# Patient Record
Sex: Female | Born: 1945
Health system: Southern US, Community
[De-identification: ages and names within clinical notes are randomized; demographics above are authoritative.]

## PROBLEM LIST (undated history)

## (undated) DIAGNOSIS — F3289 Other specified depressive episodes: Secondary | ICD-10-CM

## (undated) DIAGNOSIS — H3530 Unspecified macular degeneration: Secondary | ICD-10-CM

## (undated) DIAGNOSIS — K219 Gastro-esophageal reflux disease without esophagitis: Secondary | ICD-10-CM

## (undated) DIAGNOSIS — E785 Hyperlipidemia, unspecified: Secondary | ICD-10-CM

## (undated) DIAGNOSIS — I10 Essential (primary) hypertension: Secondary | ICD-10-CM

## (undated) DIAGNOSIS — Z0000 Encounter for general adult medical examination without abnormal findings: Secondary | ICD-10-CM

## (undated) DIAGNOSIS — M797 Fibromyalgia: Secondary | ICD-10-CM

## (undated) DIAGNOSIS — J449 Chronic obstructive pulmonary disease, unspecified: Secondary | ICD-10-CM

## (undated) DIAGNOSIS — R5383 Other fatigue: Secondary | ICD-10-CM

## (undated) DIAGNOSIS — F329 Major depressive disorder, single episode, unspecified: Secondary | ICD-10-CM

## (undated) DIAGNOSIS — M1990 Unspecified osteoarthritis, unspecified site: Secondary | ICD-10-CM

## (undated) HISTORY — DX: Hyperlipidemia, unspecified: E78.5

## (undated) HISTORY — PX: ABDOMINAL HYSTERECTOMY: SHX81

## (undated) HISTORY — DX: Other specified depressive episodes: F32.89

## (undated) HISTORY — DX: Gastro-esophageal reflux disease without esophagitis: K21.9

## (undated) HISTORY — DX: Encounter for general adult medical examination without abnormal findings: Z00.00

## (undated) HISTORY — DX: Other fatigue: R53.83

## (undated) HISTORY — DX: Chronic obstructive pulmonary disease, unspecified: J44.9

## (undated) HISTORY — DX: Major depressive disorder, single episode, unspecified: F32.9

## (undated) HISTORY — DX: Fibromyalgia: M79.7

---

## 2006-09-06 ENCOUNTER — Encounter (HOSPITAL_COMMUNITY): Admission: RE | Admit: 2006-09-06 | Discharge: 2006-10-06 | Payer: Self-pay | Admitting: Sports Medicine

## 2006-10-09 ENCOUNTER — Encounter (HOSPITAL_COMMUNITY): Admission: RE | Admit: 2006-10-09 | Discharge: 2006-11-08 | Payer: Self-pay | Admitting: Sports Medicine

## 2007-06-04 ENCOUNTER — Encounter (INDEPENDENT_AMBULATORY_CARE_PROVIDER_SITE_OTHER): Payer: Self-pay | Admitting: Internal Medicine

## 2007-06-04 ENCOUNTER — Ambulatory Visit: Payer: Self-pay | Admitting: Family Medicine

## 2007-06-04 DIAGNOSIS — R5381 Other malaise: Secondary | ICD-10-CM | POA: Insufficient documentation

## 2007-06-04 DIAGNOSIS — E782 Mixed hyperlipidemia: Secondary | ICD-10-CM | POA: Insufficient documentation

## 2007-06-04 DIAGNOSIS — E119 Type 2 diabetes mellitus without complications: Secondary | ICD-10-CM | POA: Insufficient documentation

## 2007-06-04 DIAGNOSIS — K209 Esophagitis, unspecified without bleeding: Secondary | ICD-10-CM | POA: Insufficient documentation

## 2007-06-04 DIAGNOSIS — F329 Major depressive disorder, single episode, unspecified: Secondary | ICD-10-CM

## 2007-06-04 DIAGNOSIS — E785 Hyperlipidemia, unspecified: Secondary | ICD-10-CM | POA: Insufficient documentation

## 2007-06-04 DIAGNOSIS — IMO0001 Reserved for inherently not codable concepts without codable children: Secondary | ICD-10-CM | POA: Insufficient documentation

## 2007-06-04 DIAGNOSIS — R5383 Other fatigue: Secondary | ICD-10-CM

## 2007-07-17 ENCOUNTER — Ambulatory Visit: Payer: Self-pay | Admitting: Family Medicine

## 2007-07-17 ENCOUNTER — Encounter (INDEPENDENT_AMBULATORY_CARE_PROVIDER_SITE_OTHER): Payer: Self-pay | Admitting: Internal Medicine

## 2007-07-18 ENCOUNTER — Ambulatory Visit: Payer: Self-pay | Admitting: Family Medicine

## 2007-07-19 ENCOUNTER — Encounter (INDEPENDENT_AMBULATORY_CARE_PROVIDER_SITE_OTHER): Payer: Self-pay | Admitting: *Deleted

## 2007-07-19 LAB — CONVERTED CEMR LAB
Creatinine,U: 18 mg/dL
Hgb A1c MFr Bld: 6.8 % — ABNORMAL HIGH (ref 4.6–6.0)
Microalb Creat Ratio: 94.4 mg/g — ABNORMAL HIGH (ref 0.0–30.0)
Microalb, Ur: 1.7 mg/dL (ref 0.0–1.9)

## 2007-08-06 ENCOUNTER — Encounter (INDEPENDENT_AMBULATORY_CARE_PROVIDER_SITE_OTHER): Payer: Self-pay | Admitting: Internal Medicine

## 2007-10-11 ENCOUNTER — Ambulatory Visit: Payer: Self-pay | Admitting: Family Medicine

## 2007-10-11 DIAGNOSIS — J449 Chronic obstructive pulmonary disease, unspecified: Secondary | ICD-10-CM

## 2007-10-11 DIAGNOSIS — J441 Chronic obstructive pulmonary disease with (acute) exacerbation: Secondary | ICD-10-CM | POA: Insufficient documentation

## 2007-10-24 ENCOUNTER — Ambulatory Visit: Payer: Self-pay | Admitting: Internal Medicine

## 2007-10-25 ENCOUNTER — Encounter (INDEPENDENT_AMBULATORY_CARE_PROVIDER_SITE_OTHER): Payer: Self-pay | Admitting: Internal Medicine

## 2007-10-25 LAB — CONVERTED CEMR LAB: Hgb A1c MFr Bld: 7.3 % — ABNORMAL HIGH (ref 4.6–6.0)

## 2007-12-10 ENCOUNTER — Telehealth (INDEPENDENT_AMBULATORY_CARE_PROVIDER_SITE_OTHER): Payer: Self-pay | Admitting: Internal Medicine

## 2007-12-11 ENCOUNTER — Ambulatory Visit: Payer: Self-pay | Admitting: Family Medicine

## 2007-12-13 ENCOUNTER — Ambulatory Visit: Payer: Self-pay | Admitting: Family Medicine

## 2007-12-13 ENCOUNTER — Telehealth (INDEPENDENT_AMBULATORY_CARE_PROVIDER_SITE_OTHER): Payer: Self-pay | Admitting: Internal Medicine

## 2007-12-13 DIAGNOSIS — F17200 Nicotine dependence, unspecified, uncomplicated: Secondary | ICD-10-CM

## 2007-12-13 LAB — CONVERTED CEMR LAB
Albumin: 4.5 g/dL (ref 3.5–5.2)
Alkaline Phosphatase: 75 units/L (ref 39–117)
Basophils Absolute: 0 10*3/uL (ref 0.0–0.1)
Basophils Relative: 0.4 % (ref 0.0–1.0)
Bilirubin, Direct: 0.1 mg/dL (ref 0.0–0.3)
CO2: 31 meq/L (ref 19–32)
Calcium: 9.8 mg/dL (ref 8.4–10.5)
Creatinine, Ser: 0.9 mg/dL (ref 0.4–1.2)
Eosinophils Absolute: 0.3 10*3/uL (ref 0.0–0.7)
Eosinophils Relative: 3.3 % (ref 0.0–5.0)
GFR calc non Af Amer: 67 mL/min
HCT: 46.3 % — ABNORMAL HIGH (ref 36.0–46.0)
HDL: 34.3 mg/dL — ABNORMAL LOW (ref >39.0)
Hgb A1c MFr Bld: 7 % — ABNORMAL HIGH (ref 4.6–6.0)
Lymphocytes Relative: 29.1 % (ref 12.0–46.0)
Monocytes Absolute: 0.5 10*3/uL (ref 0.1–1.0)
Monocytes Relative: 6 % (ref 3.0–12.0)
Neutro Abs: 5.2 10*3/uL (ref 1.4–7.7)
Potassium: 4.7 meq/L (ref 3.5–5.1)
RDW: 12.3 % (ref 11.5–14.6)
TSH: 1.48 microintl units/mL (ref 0.35–5.50)
WBC: 8.5 10*3/uL (ref 4.5–10.5)

## 2008-02-18 ENCOUNTER — Ambulatory Visit: Payer: Self-pay | Admitting: Internal Medicine

## 2008-02-18 LAB — CONVERTED CEMR LAB
Bilirubin Urine: NEGATIVE
Specific Gravity, Urine: 1.015
WBC Urine, dipstick: NEGATIVE

## 2008-02-20 ENCOUNTER — Encounter (INDEPENDENT_AMBULATORY_CARE_PROVIDER_SITE_OTHER): Payer: Self-pay | Admitting: Internal Medicine

## 2008-04-08 ENCOUNTER — Ambulatory Visit: Payer: Self-pay | Admitting: Family Medicine

## 2008-04-10 ENCOUNTER — Ambulatory Visit: Payer: Self-pay | Admitting: Family Medicine

## 2008-04-10 LAB — CONVERTED CEMR LAB
Albumin: 4.3 g/dL (ref 3.5–5.2)
Alkaline Phosphatase: 69 units/L (ref 39–117)
Bilirubin, Direct: 0.1 mg/dL (ref 0.0–0.3)
Cholesterol: 168 mg/dL (ref 0–200)
HDL: 36.4 mg/dL — ABNORMAL LOW (ref >39.0)
LDL Cholesterol: 106 mg/dL — ABNORMAL HIGH (ref 0–99)
Total Bilirubin: 1 mg/dL (ref 0.3–1.2)
Triglycerides: 126 mg/dL (ref 0–149)
VLDL: 25 mg/dL (ref 0–40)

## 2008-04-14 ENCOUNTER — Telehealth: Payer: Self-pay | Admitting: Family Medicine

## 2008-04-14 ENCOUNTER — Ambulatory Visit: Payer: Self-pay | Admitting: Family Medicine

## 2008-04-14 LAB — CONVERTED CEMR LAB
Bilirubin Urine: NEGATIVE
Nitrite: NEGATIVE
Urobilinogen, UA: 0.2

## 2008-04-15 ENCOUNTER — Encounter: Payer: Self-pay | Admitting: Family Medicine

## 2008-05-19 ENCOUNTER — Ambulatory Visit: Payer: Self-pay | Admitting: Family Medicine

## 2008-05-20 ENCOUNTER — Encounter (INDEPENDENT_AMBULATORY_CARE_PROVIDER_SITE_OTHER): Payer: Self-pay | Admitting: Internal Medicine

## 2008-05-22 LAB — CONVERTED CEMR LAB
Cholesterol: 167 mg/dL (ref 0–200)
HDL: 40.4 mg/dL (ref >39.0)
Hgb A1c MFr Bld: 6.7 % — ABNORMAL HIGH (ref 4.6–6.0)

## 2008-06-12 ENCOUNTER — Ambulatory Visit: Payer: Self-pay | Admitting: Family Medicine

## 2008-06-18 ENCOUNTER — Telehealth (INDEPENDENT_AMBULATORY_CARE_PROVIDER_SITE_OTHER): Payer: Self-pay | Admitting: Internal Medicine

## 2008-06-18 LAB — CONVERTED CEMR LAB
ALT: 16 units/L (ref 0–35)
HDL: 36.7 mg/dL — ABNORMAL LOW (ref >39.0)
Hgb A1c MFr Bld: 6.5 % — ABNORMAL HIGH (ref 4.6–6.0)
Total CHOL/HDL Ratio: 4.8
Triglycerides: 142 mg/dL (ref 0–149)
VLDL: 28 mg/dL (ref 0–40)

## 2008-06-25 ENCOUNTER — Telehealth: Payer: Self-pay | Admitting: Family Medicine

## 2008-06-25 ENCOUNTER — Telehealth (INDEPENDENT_AMBULATORY_CARE_PROVIDER_SITE_OTHER): Payer: Self-pay | Admitting: Internal Medicine

## 2008-07-31 ENCOUNTER — Ambulatory Visit: Payer: Self-pay | Admitting: Family Medicine

## 2008-07-31 DIAGNOSIS — D551 Anemia due to other disorders of glutathione metabolism: Secondary | ICD-10-CM | POA: Insufficient documentation

## 2008-08-01 ENCOUNTER — Telehealth (INDEPENDENT_AMBULATORY_CARE_PROVIDER_SITE_OTHER): Payer: Self-pay | Admitting: Internal Medicine

## 2008-08-01 LAB — CONVERTED CEMR LAB
AST: 25 units/L (ref 0–37)
Bilirubin, Direct: 0.1 mg/dL (ref 0.0–0.3)
HDL: 35.8 mg/dL — ABNORMAL LOW (ref >39.0)
LDL Cholesterol: 80 mg/dL (ref 0–99)
VLDL: 26 mg/dL (ref 0–40)

## 2008-08-11 ENCOUNTER — Ambulatory Visit: Payer: Self-pay | Admitting: Family Medicine

## 2008-08-13 ENCOUNTER — Telehealth (INDEPENDENT_AMBULATORY_CARE_PROVIDER_SITE_OTHER): Payer: Self-pay | Admitting: Internal Medicine

## 2008-08-14 LAB — CONVERTED CEMR LAB: Hgb A1c MFr Bld: 6.8 % — ABNORMAL HIGH (ref 4.6–6.0)

## 2008-08-25 ENCOUNTER — Telehealth: Payer: Self-pay | Admitting: Family Medicine

## 2008-09-08 ENCOUNTER — Ambulatory Visit: Payer: Self-pay | Admitting: Family Medicine

## 2008-09-08 ENCOUNTER — Encounter (INDEPENDENT_AMBULATORY_CARE_PROVIDER_SITE_OTHER): Payer: Self-pay | Admitting: Internal Medicine

## 2008-10-14 ENCOUNTER — Telehealth (INDEPENDENT_AMBULATORY_CARE_PROVIDER_SITE_OTHER): Payer: Self-pay | Admitting: Internal Medicine

## 2008-11-05 ENCOUNTER — Ambulatory Visit: Payer: Self-pay | Admitting: Family Medicine

## 2008-11-13 LAB — CONVERTED CEMR LAB
AST: 23 units/L (ref 0–37)
LDL Cholesterol: 59 mg/dL (ref 0–99)
Total CHOL/HDL Ratio: 3

## 2008-11-25 ENCOUNTER — Ambulatory Visit: Payer: Self-pay | Admitting: Family Medicine

## 2008-12-09 ENCOUNTER — Telehealth (INDEPENDENT_AMBULATORY_CARE_PROVIDER_SITE_OTHER): Payer: Self-pay | Admitting: Internal Medicine

## 2008-12-22 ENCOUNTER — Telehealth: Payer: Self-pay | Admitting: Family Medicine

## 2008-12-23 ENCOUNTER — Telehealth (INDEPENDENT_AMBULATORY_CARE_PROVIDER_SITE_OTHER): Payer: Self-pay | Admitting: Internal Medicine

## 2008-12-23 ENCOUNTER — Ambulatory Visit: Payer: Self-pay | Admitting: Family Medicine

## 2008-12-24 ENCOUNTER — Telehealth (INDEPENDENT_AMBULATORY_CARE_PROVIDER_SITE_OTHER): Payer: Self-pay | Admitting: Internal Medicine

## 2008-12-24 LAB — CONVERTED CEMR LAB: Hgb A1c MFr Bld: 7.1 % — ABNORMAL HIGH (ref 4.6–6.5)

## 2009-01-21 ENCOUNTER — Telehealth (INDEPENDENT_AMBULATORY_CARE_PROVIDER_SITE_OTHER): Payer: Self-pay | Admitting: Internal Medicine

## 2009-02-17 ENCOUNTER — Ambulatory Visit: Payer: Self-pay | Admitting: Family Medicine

## 2009-02-20 LAB — CONVERTED CEMR LAB: Hgb A1c MFr Bld: 6.8 % — ABNORMAL HIGH (ref 4.6–6.5)

## 2009-03-16 ENCOUNTER — Ambulatory Visit: Payer: Self-pay | Admitting: Family Medicine

## 2009-03-16 ENCOUNTER — Telehealth: Payer: Self-pay | Admitting: Internal Medicine

## 2009-03-16 LAB — CONVERTED CEMR LAB
Bilirubin Urine: NEGATIVE
Nitrite: NEGATIVE
Specific Gravity, Urine: <1.005
pH: 6

## 2009-03-20 ENCOUNTER — Emergency Department (HOSPITAL_COMMUNITY): Admission: EM | Admit: 2009-03-20 | Discharge: 2009-03-20 | Payer: Self-pay | Admitting: Emergency Medicine

## 2009-03-20 ENCOUNTER — Encounter (INDEPENDENT_AMBULATORY_CARE_PROVIDER_SITE_OTHER): Payer: Self-pay | Admitting: Internal Medicine

## 2009-03-20 DIAGNOSIS — R918 Other nonspecific abnormal finding of lung field: Secondary | ICD-10-CM

## 2009-03-24 ENCOUNTER — Ambulatory Visit: Payer: Self-pay | Admitting: Family Medicine

## 2009-03-24 ENCOUNTER — Encounter (INDEPENDENT_AMBULATORY_CARE_PROVIDER_SITE_OTHER): Payer: Self-pay | Admitting: Internal Medicine

## 2009-03-27 ENCOUNTER — Telehealth (INDEPENDENT_AMBULATORY_CARE_PROVIDER_SITE_OTHER): Payer: Self-pay | Admitting: Internal Medicine

## 2009-04-08 ENCOUNTER — Telehealth (INDEPENDENT_AMBULATORY_CARE_PROVIDER_SITE_OTHER): Payer: Self-pay | Admitting: Internal Medicine

## 2009-04-13 ENCOUNTER — Ambulatory Visit: Payer: Self-pay | Admitting: Family Medicine

## 2009-05-16 ENCOUNTER — Telehealth: Payer: Self-pay | Admitting: Internal Medicine

## 2009-05-18 ENCOUNTER — Telehealth (INDEPENDENT_AMBULATORY_CARE_PROVIDER_SITE_OTHER): Payer: Self-pay | Admitting: Internal Medicine

## 2009-07-06 ENCOUNTER — Telehealth: Payer: Self-pay | Admitting: Family Medicine

## 2009-07-06 ENCOUNTER — Ambulatory Visit: Payer: Self-pay | Admitting: Family Medicine

## 2009-07-06 LAB — CONVERTED CEMR LAB
Ketones, urine, test strip: NEGATIVE
Urobilinogen, UA: 0.2

## 2009-07-24 ENCOUNTER — Telehealth: Payer: Self-pay | Admitting: Family Medicine

## 2009-07-27 ENCOUNTER — Telehealth (INDEPENDENT_AMBULATORY_CARE_PROVIDER_SITE_OTHER): Payer: Self-pay | Admitting: *Deleted

## 2009-07-31 ENCOUNTER — Ambulatory Visit: Payer: Self-pay | Admitting: Family Medicine

## 2009-07-31 DIAGNOSIS — J019 Acute sinusitis, unspecified: Secondary | ICD-10-CM | POA: Insufficient documentation

## 2009-08-18 ENCOUNTER — Ambulatory Visit: Payer: Self-pay | Admitting: Family Medicine

## 2009-08-18 ENCOUNTER — Telehealth: Payer: Self-pay | Admitting: Family Medicine

## 2009-08-19 LAB — CONVERTED CEMR LAB
Microalb Creat Ratio: 23.1 mg/g (ref 0.0–30.0)
Microalb, Ur: 0.4 mg/dL (ref 0.0–1.9)

## 2009-09-03 LAB — HM MAMMOGRAPHY: HM Mammogram: NORMAL

## 2009-09-09 ENCOUNTER — Ambulatory Visit: Payer: Self-pay | Admitting: Family Medicine

## 2009-09-09 ENCOUNTER — Encounter: Payer: Self-pay | Admitting: Family Medicine

## 2009-09-10 DIAGNOSIS — R928 Other abnormal and inconclusive findings on diagnostic imaging of breast: Secondary | ICD-10-CM | POA: Insufficient documentation

## 2009-09-11 ENCOUNTER — Encounter: Payer: Self-pay | Admitting: Family Medicine

## 2009-09-11 ENCOUNTER — Ambulatory Visit: Payer: Self-pay | Admitting: Family Medicine

## 2009-09-17 ENCOUNTER — Ambulatory Visit: Payer: Self-pay | Admitting: Family Medicine

## 2009-09-17 ENCOUNTER — Other Ambulatory Visit: Admission: RE | Admit: 2009-09-17 | Discharge: 2009-09-17 | Payer: Self-pay | Admitting: Family Medicine

## 2009-09-22 ENCOUNTER — Encounter (INDEPENDENT_AMBULATORY_CARE_PROVIDER_SITE_OTHER): Payer: Self-pay | Admitting: *Deleted

## 2009-09-28 ENCOUNTER — Telehealth: Payer: Self-pay | Admitting: Family Medicine

## 2009-09-29 ENCOUNTER — Ambulatory Visit: Payer: Self-pay | Admitting: Family Medicine

## 2009-09-29 ENCOUNTER — Encounter: Payer: Self-pay | Admitting: Family Medicine

## 2009-10-12 ENCOUNTER — Telehealth: Payer: Self-pay | Admitting: Family Medicine

## 2009-10-13 ENCOUNTER — Ambulatory Visit: Payer: Self-pay | Admitting: Family Medicine

## 2009-11-19 ENCOUNTER — Ambulatory Visit: Payer: Self-pay | Admitting: Family Medicine

## 2009-11-27 ENCOUNTER — Ambulatory Visit: Payer: Self-pay | Admitting: Family Medicine

## 2009-12-17 ENCOUNTER — Telehealth: Payer: Self-pay | Admitting: Family Medicine

## 2010-01-01 ENCOUNTER — Ambulatory Visit: Payer: Self-pay | Admitting: Family Medicine

## 2010-01-01 DIAGNOSIS — N390 Urinary tract infection, site not specified: Secondary | ICD-10-CM

## 2010-01-01 LAB — CONVERTED CEMR LAB
Casts: 0 /lpf
Glucose, Urine, Semiquant: NEGATIVE
Ketones, urine, test strip: NEGATIVE

## 2010-01-08 ENCOUNTER — Telehealth: Payer: Self-pay | Admitting: Family Medicine

## 2010-02-09 ENCOUNTER — Ambulatory Visit: Payer: Self-pay | Admitting: Family Medicine

## 2010-03-08 ENCOUNTER — Telehealth: Payer: Self-pay | Admitting: Family Medicine

## 2010-03-09 ENCOUNTER — Ambulatory Visit: Payer: Self-pay | Admitting: Family Medicine

## 2010-03-11 LAB — CONVERTED CEMR LAB: Hgb A1c MFr Bld: 7.3 % — ABNORMAL HIGH (ref 4.6–6.5)

## 2010-03-15 ENCOUNTER — Ambulatory Visit: Payer: Self-pay | Admitting: Family Medicine

## 2010-03-19 ENCOUNTER — Ambulatory Visit: Payer: Self-pay | Admitting: Internal Medicine

## 2010-03-30 ENCOUNTER — Ambulatory Visit: Payer: Self-pay | Admitting: Family Medicine

## 2010-03-30 DIAGNOSIS — J040 Acute laryngitis: Secondary | ICD-10-CM | POA: Insufficient documentation

## 2010-05-10 ENCOUNTER — Telehealth: Payer: Self-pay | Admitting: Family Medicine

## 2010-06-03 ENCOUNTER — Encounter: Payer: Self-pay | Admitting: Pulmonary Disease

## 2010-06-09 ENCOUNTER — Telehealth: Payer: Self-pay | Admitting: Family Medicine

## 2010-06-28 ENCOUNTER — Ambulatory Visit
Admission: RE | Admit: 2010-06-28 | Discharge: 2010-06-28 | Payer: Self-pay | Source: Home / Self Care | Attending: Pulmonary Disease | Admitting: Pulmonary Disease

## 2010-06-29 NOTE — Progress Notes (Signed)
Summary: refill request for xanax  Phone Note Refill Request Call back at Home Phone [phone] Message from:  Patient  Refills Requested: Medication #1:  ALPRAZOLAM 0.5 MG  TABS 1/2 tab two times a day Called request from pt, please send to [facility].  Initial call taken by: Lowella Petties CMA,  January 08, 2010 11:31 AM  Follow-up for Phone Call        [facility] Follow-up by: Linde Gillis CMA Duncan Dull),  January 08, 2010 11:37 AM    Prescriptions: ALPRAZOLAM 0.5 MG  TABS (ALPRAZOLAM) 1/2 tab two times a day  #30 x 1   Entered and Authorized by:   Ruthe Mannan MD   Signed by:   Ruthe Mannan MD on 01/08/2010   Method used:   Telephoned to ...         RxID:   [id number]

## 2010-06-29 NOTE — Letter (Signed)
Summary: Results Follow up Letter  [place] [facility]  [address] [place], [place] [address]   Phone: [phone]  Fax: [phone]    09/22/2009 MRN: [id number]    Sara Stafford [address] [place], [place]  [address]    Dear [name],  The following are the results of your recent test(s):  Test         Result    Pap Smear:        Normal __X___  Not Normal _____ Comments: ______________________________________________________ Cholesterol: LDL(Bad cholesterol):         Your goal is less than:         HDL (Good cholesterol):       Your goal is more than: Comments:  ______________________________________________________ Mammogram:        Normal _____  Not Normal _____ Comments:  ___________________________________________________________________ Hemoccult:        Normal _____  Not normal _______ Comments:    _____________________________________________________________________ Other Tests:    We routinely do not discuss normal results over the telephone.  If you desire a copy of the results, or you have any questions about this information we can discuss them at your next office visit.   Sincerely,    Ruthe Mannan,  M.D.  TA:lsf

## 2010-06-29 NOTE — Progress Notes (Signed)
Summary: alprazolam refill   Phone Note Refill Request Call back at Home Phone [phone] Message from:  Patient on August 18, 2009 9:30 AM  Refills Requested: Medication #1:  ALPRAZOLAM 0.5 MG  TABS 1/2 tab two times a day Patient is also requesting something for yeast infection. [facility] [address] [facility]   Initial call taken by: Melody Comas,  August 18, 2009 9:31 AM  Follow-up for Phone Call        Okay, anything for the yeast infection?  Lugene Fuquay CMA Duncan Dull)  August 18, 2009 9:52 AM   Medication phoned to pharmacy. Lugene Fuquay CMA (AAMA)  August 18, 2009 10:07 AM     New/Updated Medications: DIFLUCAN 100 MG TABS (FLUCONAZOLE) 1 tab by mouth x 1 Prescriptions: DIFLUCAN 100 MG TABS (FLUCONAZOLE) 1 tab by mouth x 1  #1 x 0   Entered and Authorized by:   Ruthe Mannan MD   Signed by:   Melody Comas on 08/18/2009   Method used:   Handwritten   RxID:   [id number] ALPRAZOLAM 0.5 MG  TABS (ALPRAZOLAM) 1/2 tab two times a day  #30 x 1   Entered and Authorized by:   Ruthe Mannan MD   Signed by:   Ruthe Mannan MD on 08/18/2009   Method used:   Handwritten   RxID:   [id number]

## 2010-06-29 NOTE — Assessment & Plan Note (Signed)
Summary: SHINGLES SHOT/DLO  Nurse Visit   Allergies: 1)  ! Doxycycline  Immunizations Administered:  Zostavax # 1:    Vaccine Type: Zostavax    Site: left deltoid    Mfr: Merck    Dose: 0.5 ml    Route: [place]    Given by: Lowella Petties CMA    Exp. Date: 11/06/2010    Lot #: [id number]    VIS given: 03/11/05 given Oct 13, 2009.  Orders Added: 1)  Zoster (Shingles) Vaccine Live [90736] 2)  Admin 1st Vaccine [phone]

## 2010-06-29 NOTE — Assessment & Plan Note (Signed)
Summary: ARON FLU SHOT/RBH  Nurse Visit   Allergies: 1)  ! Doxycycline  Orders Added: 1)  Admin 1st Vaccine [90471] 2)  Flu Vaccine 37yrs + [10272]   Flu Vaccine Consent Questions     Do you have a history of severe allergic reactions to this vaccine? no    Any prior history of allergic reactions to egg and/or gelatin? no    Do you have a sensitivity to the preservative Thimersol? no    Do you have a past history of Guillan-Barre Syndrome? no    Do you currently have an acute febrile illness? no    Have you ever had a severe reaction to latex? no    Vaccine information given and explained to patient? yes    Are you currently pregnant? no    Lot Number:[id number]   Exp Date:11/27/2010   Site Given  Left Deltoid IM

## 2010-06-29 NOTE — Assessment & Plan Note (Signed)
Summary: uti/alc   Vital Signs:  Patient profile:   65 year old female Height:      64.5 inches Weight:      141.50 pounds BMI:     24.00 Temp:     98.1 degrees F oral Pulse rate:   80 / minute Pulse rhythm:   regular BP sitting:   132 / 80  (left arm) Cuff size:   regular  Vitals Entered By: Linde Gillis CMA Duncan Dull) (January 01, 2010 12:14 PM) CC: ?UTI   History of Present Illness: hurts to urinate  used azo - this did help  started some symptoms on friday  bladder pressure as well   no fever  stomach is a little queasy   is drinking lots of water  some decaf   Allergies: 1)  ! Doxycycline  Past History:  Past Medical History: Last updated: Oct 19, 2007 Current Problems:  COPD (ICD-496) HEALTH SCREENING (ICD-V70.0) GERD (ICD-530.1) HYPERLIPIDEMIA (ICD-272.2) FIBROMYALGIA (ICD-729.1) FATIGUE (ICD-780.79) DEPRESSIVE DISORDER (ICD-311) DIABETES-TYPE 2 (ICD-250.00)    Family History: Last updated: 2007-10-19 Father: died at 80--CVA, DM,  Mother: died at 100--aspirated Siblings: 9 sis--1 died at 52 of liver ca                          1 died  at 37 of pancreatic ca                          7 living---DM,                1 br--died of brain ca  DM-PGM MI-0 CVA- 0 Prostate Cancer-0 Breast Cancer-Maunt Ovarian Cancer-0 Uterine Cancer-0 Colon Cancer-0 Drug/ ETOH Abuse-0 Depression-0    Social History: Last updated: 10-19-2007 Marital Status: Married Children: 2---39 and 35, expecting new grand child, has 1 Occupation: not working Current Smoker 45 years 1 ppd  Risk Factors: Caffeine Use: 3 (06/04/2007) Exercise: no (06/04/2007)  Risk Factors: Smoking Status: current (11/25/2008) Packs/Day: 1.0 (11/25/2008) Passive Smoke Exposure: yes (06/04/2007)  Review of Systems General:  Complains of fatigue; denies chills and fever. CV:  Denies chest pain or discomfort and palpitations. Resp:  Denies cough. GI:  Complains of nausea; denies change in  bowel habits and vomiting. GU:  Complains of dysuria and urinary frequency; denies hematuria. MS:  Complains of low back pain. Derm:  Denies rash.  Physical Exam  General:  Well-developed,well-nourished,in no acute distress; alert,appropriate and cooperative throughout examination Abdomen:  mild suprapubic tenderness without rebound or gaurding    Impression & Recommendations:  Problem # 1:  UTI (ICD-599.0) Assessment New  uncomplicated with dysuria  tx with cipro recommend sympt care- see pt instructions   will avoid tea and acidic bev and baths diflucan one dose in case of yeast  Her updated medication list for this problem includes:    Cipro 250 Mg Tabs (Ciprofloxacin hcl) .Marland Kitchen... 1 by mouth two times a day for 5 days  Orders: UA Dipstick W/ Micro (manual) ([address])  Complete Medication List: 1)  Preservision Areds Caps (Multiple vitamins-minerals) .... Take 1 capsule by mouth once a day 2)  Effexor 37.5 Mg Tabs (Venlafaxine hcl) .... 1/2 tab two times a day 3)  Alprazolam 0.5 Mg Tabs (Alprazolam) .... 1/2 tab two times a day 4)  Eq Omeprazole 20 Mg Tbec (Omeprazole) .... Take 1 tablet by mouth once a day as needed 5)  Allegra-d 12 Hour 60-120 Mg Tb12 (Fexofenadine-pseudoephedrine) .Marland KitchenMarland KitchenMarland Kitchen  Take 1 tablet by mouth every 12 hours as needed 6)  Simvastatin 80 Mg Tabs (Simvastatin) .Marland Kitchen.. 1 each evening for cholesterol 7)  Aleve 220 Mg Tabs (Naproxen sodium) .... Otc as directed 8)  Glipizide Xl 10 Mg Xr24h-tab (Glipizide) .... Take 1 once daily for diabetes 9)  Ventolin Hfa 108 (90 Base) Mcg/act Aers (Albuterol sulfate) .... Two puffs four times a day as needed 10)  Spiriva Handihaler 18 Mcg Caps (Tiotropium bromide monohydrate) .... Use one ampule daily as needed. 11)  Cipro 250 Mg Tabs (Ciprofloxacin hcl) .Marland Kitchen.. 1 by mouth two times a day for 5 days 12)  Fluconazole 150 Mg Tabs (Fluconazole) .Marland Kitchen.. 1 by mouth times one as needed for yeast infection as needed  Patient Instructions: 1)   continue drinking lots of water 2)  call or seek care is symptoms don't improve in 2-3 days or if you develop back pain, nausea, or vomiting  3)  take the cipro as directed  4)  take the diflucan only if you get a yeast infection Prescriptions: FLUCONAZOLE 150 MG TABS (FLUCONAZOLE) 1 by mouth times one as needed for yeast infection as needed  #1 x 0   Entered and Authorized by:   Judith Part MD   Signed by:   Judith Part MD on 01/01/2010   Method used:   Print then Give to Patient   RxID:   [id number] CIPRO 250 MG TABS (CIPROFLOXACIN HCL) 1 by mouth two times a day for 5 days  #10 x 0   Entered and Authorized by:   Judith Part MD   Signed by:   Judith Part MD on 01/01/2010   Method used:   Print then Give to Patient   RxID:   [id number]   Current Allergies (reviewed today): ! DOXYCYCLINE  Laboratory Results   Urine Tests  Date/Time Received: January 01, 2010 12:25 PM   Routine Urinalysis   Color: yellow Appearance: Hazy Glucose: negative   (Normal Range: Negative) Bilirubin: negative   (Normal Range: Negative) Ketone: negative   (Normal Range: Negative) Spec. Gravity: 1.010   (Normal Range: 1.003-1.035) Blood: large   (Normal Range: Negative) pH: 5.0   (Normal Range: 5.0-8.0) Protein: trace   (Normal Range: Negative) Urobilinogen: 0.2   (Normal Range: 0-1) Nitrite: negative   (Normal Range: Negative) Leukocyte Esterace: large   (Normal Range: Negative)  Urine Microscopic WBC/HPF: many RBC/HPF: 2-3 Bacteria/HPF: many Mucous/HPF: few Epithelial/HPF: 1-2 Crystals/HPF: 0 Casts/LPF: 0 Yeast/HPF: 0 Other: 0

## 2010-06-29 NOTE — Progress Notes (Signed)
Summary: requests script for advair.  Phone Note Call from Patient   Caller: Patient Call For: Dr. Dayton Martes Summary of Call: Pt was given samples of advair and she is asking if you will give her a script[facility] [place]. Initial call taken by: Lowella Petties CMA,  Sep 28, 2009 12:32 PM    New/Updated Medications: ADVAIR DISKUS 100-50 MCG/DOSE MISC (FLUTICASONE-SALMETEROL) 1 puff 2 times daily Prescriptions: ADVAIR DISKUS 100-50 MCG/DOSE MISC (FLUTICASONE-SALMETEROL) 1 puff 2 times daily  #3 x 0   Entered and Authorized by:   Ruthe Mannan MD   Signed by:   Ruthe Mannan MD on 09/28/2009   Method used:   Electronically to        [facility] [address])       [address]       [place], [place]  [address]       Ph: [phone]       Fax: [phone]   RxID:   [id number]

## 2010-06-29 NOTE — Progress Notes (Signed)
Summary: alprazolam refill  Phone Note Refill Request Call back at [phone] Message from:  [facility] on July 06, 2009 3:47 PM  Refills Requested: Medication #1:  ALPRAZOLAM 0.5 MG  TABS 1/2 tab two times a day   Last Refilled: [facility] [place] [facility] Alprazolam 0.5mg .Please advise.    Method Requested: Telephone to Pharmacy Initial call taken by: Lewanda Rife LPN,  July 06, 2009 3:48 PM    Prescriptions: ALPRAZOLAM 0.5 MG  TABS (ALPRAZOLAM) 1/2 tab two times a day  #30 x 1   Entered and Authorized by:   Ruthe Mannan MD   Signed by:   Ruthe Mannan MD on 07/06/2009   Method used:   Handwritten   RxID:   [id number]   Appended Document: alprazolam refill Medication phoned to pharmacy.

## 2010-06-29 NOTE — Progress Notes (Signed)
Summary: Rx Alprazolam  Phone Note Refill Request Call back at [phone] Message [facility]:  [facility] on Oct 12, 2009 12:57 PM  Refills Requested: Medication #1:  ALPRAZOLAM 0.5 MG  TABS 1/2 tab two times a day Received faxed refill request please advise.   Method Requested: Telephone to Pharmacy Initial call taken by: Linde Gillis CMA Duncan Dull),  Oct 12, 2009 12:58 PM  Follow-up for Phone Call        Rx called to pharmacy Follow-up by: Linde Gillis CMA Duncan Dull),  Oct 12, 2009 2:13 PM    Prescriptions: ALPRAZOLAM 0.5 MG  TABS (ALPRAZOLAM) 1/2 tab two times a day  #30 x 1   Entered and Authorized by:   Ruthe Mannan MD   Signed by:   Ruthe Mannan MD on 10/12/2009   Method used:   [facility]ph[facility] ...       [facility]  [address] [phone][facility] ([facility]ail[facility]       [address]       [place], [place]  [address]       Ph: [phone] or [phone]       Fax: [phone]   RxID:   [id number]

## 2010-06-29 NOTE — Assessment & Plan Note (Signed)
Summary: DISCUSS LAB RESULTS- 30 MINS/ lb   Vital Signs:  Patient profile:   65 year old female Height:      64.5 inches Weight:      141.25 pounds BMI:     23.96 Temp:     98.2 degrees F oral Pulse rate:   72 / minute Pulse rhythm:   regular BP sitting:   120 / 80  (left arm) Cuff size:   regular  Vitals Entered By: Linde Gillis CMA Duncan Dull) (November 27, 2009 11:34 AM) CC: discuss lab results   History of Present Illness: 66 yo here to discuss labs.   DM- a1c was 7.0 in 07/2009, now 7.2   On Glipizide 10 mg daily.  Does not check her CBGs regularly but was 137 fasting this morning. Admits to not eating well over past 3 months. She loves sweets in the summer, like apricot preserves and homeaid ice cream. She has also starting drinking regular sweet tea again.  Denies any recent episodes of hypoglycemia.   Current Medications (verified): 1)  Preservision Areds   Caps (Multiple Vitamins-Minerals) .... Take 1 Capsule By Mouth Once A Day 2)  Effexor 37.5 Mg  Tabs (Venlafaxine Hcl) .... 1/2 Tab Two Times A Day 3)  Alprazolam 0.5 Mg  Tabs (Alprazolam) .... 1/2 Tab Two Times A Day 4)  Eq Omeprazole 20 Mg  Tbec (Omeprazole) .... Take 1 Tablet By Mouth Once A Day As Needed 5)  Allegra-D 12 Hour 60-120 Mg  Tb12 (Fexofenadine-Pseudoephedrine) .... Take 1 Tablet By Mouth Every 12 Hours As Needed 6)  Simvastatin 80 Mg Tabs (Simvastatin) .Marland Kitchen.. 1 Each Evening For Cholesterol 7)  Aleve 220 Mg Tabs (Naproxen Sodium) .... Otc As Directed 8)  Glipizide Xl 10 Mg Xr24h-Tab (Glipizide) .... Take 1 Once Daily For Diabetes 9)  Ventolin Hfa 108 (90 Base) Mcg/act Aers (Albuterol Sulfate) .... Two Puffs Four Times A Day As Needed 10)  Spiriva Handihaler 18 Mcg Caps (Tiotropium Bromide Monohydrate) .... Use One Ampule Daily As Needed.  Allergies: 1)  ! Doxycycline  Review of Systems      See HPI Neuro:  Denies headaches and visual disturbances.  Physical Exam  General:   Well-developed,well-nourished,in no acute distress; alert,appropriate and cooperative throughout examination Psych:  normal affect, talkative and pleasant    Impression & Recommendations:  Problem # 1:  DIABETES-TYPE 2 (ICD-250.00) Assessment Deteriorated Time spent with patient 25 minutes, more than 50% of this time was spent counseling patient on diabetes management. We discussed strategies for cutting back on sugar without feeling deprived.  She will put Splenda in her sweet tea and thinks she can even use that in her homeaid ice cream. She will also try checking her sugars at least twice a week at different times of day so we can see a trend. Pt to come back for lab visit in 3 months to recheck a1c.  Her updated medication list for this problem includes:    Glipizide Xl 10 Mg Xr24h-tab (Glipizide) .Marland Kitchen... Take 1 once daily for diabetes  Complete Medication List: 1)  Preservision Areds Caps (Multiple vitamins-minerals) .... Take 1 capsule by mouth once a day 2)  Effexor 37.5 Mg Tabs (Venlafaxine hcl) .... 1/2 tab two times a day 3)  Alprazolam 0.5 Mg Tabs (Alprazolam) .... 1/2 tab two times a day 4)  Eq Omeprazole 20 Mg Tbec (Omeprazole) .... Take 1 tablet by mouth once a day as needed 5)  Allegra-d 12 Hour 60-120 Mg Tb12 (Fexofenadine-pseudoephedrine) .Marland KitchenMarland KitchenMarland Kitchen  Take 1 tablet by mouth every 12 hours as needed 6)  Simvastatin 80 Mg Tabs (Simvastatin) .Marland Kitchen.. 1 each evening for cholesterol 7)  Aleve 220 Mg Tabs (Naproxen sodium) .... Otc as directed 8)  Glipizide Xl 10 Mg Xr24h-tab (Glipizide) .... Take 1 once daily for diabetes 9)  Ventolin Hfa 108 (90 Base) Mcg/act Aers (Albuterol sulfate) .... Two puffs four times a day as needed 10)  Spiriva Handihaler 18 Mcg Caps (Tiotropium bromide monohydrate) .... Use one ampule daily as needed.  Patient Instructions: 1)  Happy 4th of July and enjoy the babies. 2)  Please make a lab visit only for an a1c in 3 months (250.00).  Current Allergies  (reviewed today): ! DOXYCYCLINE

## 2010-06-29 NOTE — Miscellaneous (Signed)
Summary: Controlled Substance Agreement  Controlled Substance Agreement   Imported By: Lanelle Bal 01/07/2010 12:55:00  _____________________________________________________________________  External Attachment:    Type:   Image     Comment:   External Document

## 2010-06-29 NOTE — Assessment & Plan Note (Signed)
Summary: SINUS INFECTION / LFW   Vital Signs:  Patient profile:   65 year old female Height:      64.5 inches Weight:      144.75 pounds BMI:     24.55 O2 Sat:      97 % on Room air Temp:     97.9 degrees F oral Pulse rate:   80 / minute Pulse rhythm:   regular BP sitting:   124 / 60  (left arm) Cuff size:   regular  Vitals Entered By: Lewanda Rife LPN (February 09, 2010 3:32 PM)  O2 Flow:  Room air CC: sinus infection, head congested,sinus drainage, productive cough with clear phlegm, chills.   History of Present Illness: has been very stopped up and blowing nose  some coughing  is feeling like it is getting tight in her chest and lungs  clear nasal discharge - then turning a bit yellow a little bit of achiness / thinks fever late sunday afternoon  started with it on sunday am  face is hurting over her eyes -- used heat on it   is a smoker  tight cough no wheeze  takes mucinex and takes allegra   coughing up some phlegm - a little color to it      Allergies: 1)  ! Doxycycline  Past History:  Past Medical History: Last updated: 2007/10/14 Current Problems:  COPD (ICD-496) HEALTH SCREENING (ICD-V70.0) GERD (ICD-530.1) HYPERLIPIDEMIA (ICD-272.2) FIBROMYALGIA (ICD-729.1) FATIGUE (ICD-780.79) DEPRESSIVE DISORDER (ICD-311) DIABETES-TYPE 2 (ICD-250.00)    Family History: Last updated: Oct 14, 2007 Father: died at 80--CVA, DM,  Mother: died at 100--aspirated Siblings: 9 sis--1 died at 63 of liver ca                          1 died  at 63 of pancreatic ca                          7 living---DM,                1 br--died of brain ca  DM-PGM MI-0 CVA- 0 Prostate Cancer-0 Breast Cancer-Maunt Ovarian Cancer-0 Uterine Cancer-0 Colon Cancer-0 Drug/ ETOH Abuse-0 Depression-0    Social History: Last updated: 10-14-07 Marital Status: Married Children: 2---39 and 35, expecting new grand child, has 1 Occupation: not working Current Smoker 45 years 1  ppd  Risk Factors: Caffeine Use: 3 (06/04/2007) Exercise: no (06/04/2007)  Risk Factors: Smoking Status: current (11/25/2008) Packs/Day: 1.0 (11/25/2008) Passive Smoke Exposure: yes (06/04/2007)  Review of Systems General:  Complains of chills, fever, and malaise; denies fatigue. Eyes:  Denies blurring and eye irritation. ENT:  Complains of nasal congestion, postnasal drainage, sinus pressure, and sore throat. CV:  Denies chest pain or discomfort and palpitations. Resp:  Complains of cough and sputum productive; denies pleuritic, shortness of breath, and wheezing. Derm:  Denies itching, lesion(s), poor wound healing, and rash. Neuro:  Denies numbness and tingling. Psych:  Denies anxiety and depression. Endo:  Denies excessive thirst and excessive urination.  Physical Exam  General:  Well-developed,well-nourished,in no acute distress; alert,appropriate and cooperative throughout examination Head:  normocephalic, atraumatic, and no abnormalities observed.  some mild maxillary sinus tenderness  Eyes:  vision grossly intact, pupils equal, pupils round, and pupils reactive to light.  no conjunctival pallor, injection or icterus  Ears:  R ear normal and L ear normal.   Nose:  nares are injected and congested bilaterally  Mouth:  pharynx pink and moist, no erythema, and no exudates.  some clear post nasal drip Neck:  supple with full rom and no masses or thyromegally, no JVD or carotid bruit  Chest Wall:  No deformities, masses, or tenderness noted. Lungs:  harsh bs throughout with prolonged exp phase and slt wheeze on forced exp  no rales or rhonchi   Heart:  normal rate, regular rhythm, and no murmur.   Skin:  Intact without suspicious lesions or rashes Cervical Nodes:  No lymphadenopathy noted Psych:  nl affect    Impression & Recommendations:  Problem # 1:  BRONCHITIS- ACUTE (ICD-466.0) Assessment New with uri and inc cough and tight chest in smoker with mild reactive  airways will cover with zithromax has advair to start for 2 weeks counseled on smoking cessation- pt not interested  recommend sympt care- see pt instructions   pt advised to update me if symptoms worsen or do not improve - esp if wheeze The following medications were removed from the medication list:    Cipro 250 Mg Tabs (Ciprofloxacin hcl) .Marland Kitchen... 1 by mouth two times a day for 5 days Her updated medication list for this problem includes:    Allegra-d 12 Hour 60-120 Mg Tb12 (Fexofenadine-pseudoephedrine) .Marland Kitchen... Take 1 tablet by mouth every 12 hours as needed    Ventolin Hfa 108 (90 Base) Mcg/act Aers (Albuterol sulfate) .Marland Kitchen..Marland Kitchen Two puffs four times a day as needed    Spiriva Handihaler 18 Mcg Caps (Tiotropium bromide monohydrate) ..... Use one ampule daily as needed.    Advair Diskus 100-50 Mcg/dose Aepb (Fluticasone-salmeterol) ..... One puff two times daily    Zithromax Z-pak 250 Mg Tabs (Azithromycin) .Marland Kitchen... Take by mouth as directed  Problem # 2:  TOBACCO ABUSE (ICD-305.1) discussed in detail risks of smoking, and possible outcomes including COPD, vascular dz, cancer and also respiratory infections/sinus problems   pt not interested in quitting at this time  Complete Medication List: 1)  Preservision Areds Caps (Multiple vitamins-minerals) .... Take 2 capsules by mouth in am and 2 capsules by mouth in pm. 2)  Effexor 37.5 Mg Tabs (Venlafaxine hcl) .... 1/2 tab two times a day 3)  Alprazolam 0.5 Mg Tabs (Alprazolam) .... 1/2 tab two times a day 4)  Eq Omeprazole 20 Mg Tbec (Omeprazole) .... Take 1 tablet by mouth once a day as needed 5)  Allegra-d 12 Hour 60-120 Mg Tb12 (Fexofenadine-pseudoephedrine) .... Take 1 tablet by mouth every 12 hours as needed 6)  Simvastatin 80 Mg Tabs (Simvastatin) .Marland Kitchen.. 1 each evening for cholesterol 7)  Aleve 220 Mg Tabs (Naproxen sodium) .... Otc as directed 8)  Glipizide Xl 10 Mg Xr24h-tab (Glipizide) .... Take 1 once daily for diabetes 9)  Ventolin Hfa 108  (90 Base) Mcg/act Aers (Albuterol sulfate) .... Two puffs four times a day as needed 10)  Spiriva Handihaler 18 Mcg Caps (Tiotropium bromide monohydrate) .... Use one ampule daily as needed. 11)  Advair Diskus 100-50 Mcg/dose Aepb (Fluticasone-salmeterol) .... One puff two times daily 12)  Zithromax Z-pak 250 Mg Tabs (Azithromycin) .... Take by mouth as directed  Patient Instructions: 1)  use your advair until you get over this infection  2)  mucinex and allegra are fine  3)  take zithromax for bronchitis 4)  seriously think about quitting smoking  5)  drink lots of fluids 6)  if worse or short of breath or wheezy let me know Prescriptions: ZITHROMAX Z-PAK 250 MG TABS (AZITHROMYCIN) take by mouth as directed  #  1 pack x 0   Entered and Authorized by:   Judith Part MD   Signed by:   Judith Part MD on 02/09/2010   Method used:   Print then Give to Patient   RxID:   [id number]   Current Allergies (reviewed today): ! DOXYCYCLINE

## 2010-06-29 NOTE — Progress Notes (Signed)
Summary: Rx Alprazolam  Phone Note Refill Request Call back at [phone] Message from:  [facility] on March 08, 2010 9:27 AM  Refills Requested: Medication #1:  ALPRAZOLAM 0.5 MG  TABS 1/2 tab two times a day   Last Refilled: 02/04/2010 Received faxed refill request please advise.   Method Requested: Telephone to Pharmacy Initial call taken by: Linde Gillis CMA Duncan Dull),  March 08, 2010 9:28 AM  Follow-up for Phone Call        Rx called to pharmacy Follow-up by: Linde Gillis CMA Duncan Dull),  March 08, 2010 10:58 AM    Prescriptions: ALPRAZOLAM 0.5 MG  TABS (ALPRAZOLAM) 1/2 tab two times a day  #30 x 1   Entered and Authorized by:   Ruthe Mannan MD   Signed by:   Ruthe Mannan MD on 03/08/2010   Method used:   Telephoned to ...       [facility]  [address] [phone][facility])       [address]       [place], [place]  [address]       Ph: [phone] or [phone]       Fax: [phone]   RxID:   [id number]

## 2010-06-29 NOTE — Progress Notes (Signed)
Summary: refill request for alprazolam  Phone Note Refill Request Message from:  Fax from Pharmacy  Refills Requested: Medication #1:  ALPRAZOLAM 0.5 MG  TABS 1/2 tab two times a day   Last Refilled: 10/08/2009 [facility]ax[facility] [address] [place], [phone]  Initial call taken by: Lowella Petties CMA,  December 17, 2009 10:56 AM    Prescriptions: ALPRAZOLAM 0.5 MG  TABS (ALPRAZOLAM) 1/2 tab two times a day  #30 x 1   Entered and Authorized by:   Ruthe Mannan MD   Signed by:   Ruthe Mannan MD on 12/17/2009   Method used:   Telephoned to ...         RxID:   [id number]

## 2010-06-29 NOTE — Progress Notes (Signed)
Summary: wants to schedule mammogram  Phone Note Call from Patient Call back at Home Phone [phone]   Caller: Patient Call For: Dr. Dayton Martes Summary of Call: Patient walked in saying she recieved a letter from the [facility] letting her know it is time for her mammogram and to contact her physician. Please call patient at [phone] to schedule her appt.   Initial call taken by: Melody Comas,  August 18, 2009 10:25 AM

## 2010-06-29 NOTE — Assessment & Plan Note (Signed)
Summary: BLOOD IN URINE   Vital Signs:  Patient profile:   65 year old female Weight:      141 pounds BMI:     23.91 Temp:     98.3 degrees F oral Pulse rate:   100 / minute Pulse rhythm:   regular BP sitting:   140 / 70  (left arm) Cuff size:   large  Vitals Entered By: Linde Gillis CMA Duncan Dull) (July 06, 2009 4:41 PM)  CC: ? UTI   History of Present Illness:  This 65 Years Old White Female comes in today complaining of urinary symptoms starting:  today, burning, pain, blood in urine  REVIEW OF SYSTEMS GEN: Acute illness details above. CV: No chest pain or SOB GI: No noted N or V Otherwise, pertinent positives and negatives are noted in the HPI.   GEN: WDWN, A&Ox4,NAD. Non-toxic HEENT: Atraumatic, normocephalic. CV: RRR, No M/G/R PULM: CTA B, No wheezes, crackles, or rhonchi ABD: S, NT, ND, +BS, no rebound. No CVAT. + suprapubic tenderness. EXT: No c/c/e   Allergies: 1)  ! Doxycycline   Impression & Recommendations:  Problem # 1:  UTI (ICD-599.0)  Her updated medication list for this problem includes:    Nitrofurantoin Monohyd Macro 100 Mg Caps (Nitrofurantoin monohyd macro) .Marland Kitchen... 1 by mouth two times a day  Encouraged to push clear liquids, get enough rest, and take acetaminophen as needed. To be seen in 10 days if no improvement, sooner if worse.  Orders: UA Dipstick w/o Micro (manual) (16109)  Complete Medication List: 1)  Preservision Areds Caps (Multiple vitamins-minerals) .... Take 1 capsule by mouth once a day 2)  Effexor 37.5 Mg Tabs (Venlafaxine hcl) .... 1/2 tab two times a day 3)  Alprazolam 0.5 Mg Tabs (Alprazolam) .... 1/2 tab two times a day 4)  Eq Omeprazole 20 Mg Tbec (Omeprazole) .... Take 1 tablet by mouth once a day as needed 5)  Allegra-d 12 Hour 60-120 Mg Tb12 (Fexofenadine-pseudoephedrine) .... Take 1 tablet by mouth every 12 hours as needed 6)  Simvastatin 80 Mg Tabs (Simvastatin) .Marland Kitchen.. 1 each evening for cholesterol 7)  Aleve 220 Mg  Tabs (Naproxen sodium) .... Otc as directed 8)  Glipizide Xl 10 Mg Xr24h-tab (Glipizide) .... Take 1 once daily for diabetes 9)  Ventolin Hfa 108 (90 Base) Mcg/act Aers (Albuterol sulfate) .... Two puffs four times a day 10)  Nitrofurantoin Monohyd Macro 100 Mg Caps (Nitrofurantoin monohyd macro) .Marland Kitchen.. 1 by mouth two times a day Prescriptions: NITROFURANTOIN MONOHYD MACRO 100 MG CAPS (NITROFURANTOIN MONOHYD MACRO) 1 by mouth two times a day  #14 x 0   Entered and Authorized by:   Hannah Beat MD   Signed by:   Hannah Beat MD on 07/06/2009   Method used:   [facility]        [id number]       [address]       [place], [place]  [address]       Ph: [phone] or [phone]       Fax: [phone]   RxID:   [id number]   Current Allergies (reviewed today): ! DOXYCYCLINE  Laboratory Results   Urine Tests   Date/Time Reported: July 06, 2009 4:45 PM   Routine Urinalysis   Color: orange Appearance: Cloudy Glucose: negative   (Normal Range: Negative) Bilirubin: negative   (Normal Range: Negative) Ketone: negative   (  Normal Range: Negative) Spec. Gravity: 1.010   (Normal Range: 1.003-1.035) Blood: large   (Normal Range: Negative) pH: 5.0   (Normal Range: 5.0-8.0) Protein: 30   (Normal Range: Negative) Urobilinogen: 0.2   (Normal Range: 0-1) Nitrite: negative   (Normal Range: Negative) Leukocyte Esterace: moderate   (Normal Range: Negative)

## 2010-06-29 NOTE — Assessment & Plan Note (Signed)
Summary: physical w/ pap/hmw   Vital Signs:  Patient profile:   65 year old female Height:      64.5 inches Weight:      141.38 pounds BMI:     23.98 Temp:     98.1 degrees F oral Pulse rate:   100 / minute Pulse rhythm:   regular BP sitting:   150 / 80  (left arm) Cuff size:   regular  Vitals Entered By: Linde Gillis CMA Duncan Dull) (September 17, 2009 9:03 AM) CC: 30 minute exam with pap   History of Present Illness: 65 yo here for CPX.  DM- a1c was 7.0 in 07/2009.  On Flipizide 10 mg daily.  Does not check her CBGs regularly.  Really trying to improve her diet and exercise more.  HLD- FLP great in 10/2008.  HDL 40, LDL 59, TG 122. On Simvastatin 80 mg daily, no myalgias.  COPD- having a lot of wheezing lately, likes to work outside. No fevers, chills, or productive cough.  Having to use her albuterol multiple times a day.  Not on any inhaled maintenance medications.  Well woman-  s/p hysterectomy (she thinks partial).  Wants to think about colonoscopy. Needs pneumovax and Zostavax. Due for Dexa scan. mammogram UTD, needs repeat in 6 months (already scheduled).  Current Medications (verified): 1)  Preservision Areds   Caps (Multiple Vitamins-Minerals) .... Take 1 Capsule By Mouth Once A Day 2)  Effexor 37.5 Mg  Tabs (Venlafaxine Hcl) .... 1/2 Tab Two Times A Day 3)  Alprazolam 0.5 Mg  Tabs (Alprazolam) .... 1/2 Tab Two Times A Day 4)  Eq Omeprazole 20 Mg  Tbec (Omeprazole) .... Take 1 Tablet By Mouth Once A Day As Needed 5)  Allegra-D 12 Hour 60-120 Mg  Tb12 (Fexofenadine-Pseudoephedrine) .... Take 1 Tablet By Mouth Every 12 Hours As Needed 6)  Simvastatin 80 Mg Tabs (Simvastatin) .Marland Kitchen.. 1 Each Evening For Cholesterol 7)  Aleve 220 Mg Tabs (Naproxen Sodium) .... Otc As Directed 8)  Glipizide Xl 10 Mg Xr24h-Tab (Glipizide) .... Take 1 Once Daily For Diabetes 9)  Ventolin Hfa 108 (90 Base) Mcg/act Aers (Albuterol Sulfate) .... Two Puffs Four Times A Day As Needed 10)  Advair Diskus  100-50 Mcg/dose Misc (Fluticasone-Salmeterol) .Marland Kitchen.. 1 Puff 2 Times Daily  Allergies: 1)  ! Doxycycline  Past History:  Past Medical History: Last updated: 11-10-2007 Current Problems:  COPD (ICD-496) HEALTH SCREENING (ICD-V70.0) GERD (ICD-530.1) HYPERLIPIDEMIA (ICD-272.2) FIBROMYALGIA (ICD-729.1) FATIGUE (ICD-780.79) DEPRESSIVE DISORDER (ICD-311) DIABETES-TYPE 2 (ICD-250.00)    Family History: Last updated: 11/10/07 Father: died at 80--CVA, DM,  Mother: died at 100--aspirated Siblings: 9 sis--1 died at 62 of liver ca                          1 died  at 59 of pancreatic ca                          7 living---DM,                1 br--died of brain ca  DM-PGM MI-0 CVA- 0 Prostate Cancer-0 Breast Cancer-Maunt Ovarian Cancer-0 Uterine Cancer-0 Colon Cancer-0 Drug/ ETOH Abuse-0 Depression-0    Social History: Last updated: Nov 10, 2007 Marital Status: Married Children: 2---39 and 35, expecting new grand child, has 1 Occupation: not working Current Smoker 45 years 1 ppd  Risk Factors: Caffeine Use: 3 (06/04/2007) Exercise: no (06/04/2007)  Risk Factors: Smoking Status:  current (11/25/2008) Packs/Day: 1.0 (11/25/2008) Passive Smoke Exposure: yes (06/04/2007)  Family History: Reviewed history from 10/11/2007 and no changes required. Father: died at 80--CVA, DM,  Mother: died at 100--aspirated Siblings: 9 sis--1 died at 82 of liver ca                          1 died  at 65 of pancreatic ca                          7 living---DM,                1 br--died of brain ca  DM-PGM MI-0 CVA- 0 Prostate Cancer-0 Breast Cancer-Maunt Ovarian Cancer-0 Uterine Cancer-0 Colon Cancer-0 Drug/ ETOH Abuse-0 Depression-0    Social History: Reviewed history from 10/11/2007 and no changes required. Marital Status: Married Children: 2---39 and 35, expecting new grand child, has 1 Occupation: not working Current Smoker 45 years 1 ppd  Review of Systems      See  HPI General:  Denies chills and fever. Eyes:  Denies blurring. ENT:  Denies difficulty swallowing. CV:  Denies chest pain or discomfort and shortness of breath with exertion. Resp:  Complains of wheezing; denies cough, shortness of breath, and sputum productive. GI:  Denies abdominal pain, nausea, and vomiting. GU:  Denies abnormal vaginal bleeding. MS:  Denies muscle weakness. Derm:  Denies rash. Neuro:  Denies headaches. Psych:  Denies anxiety, depression, suicidal thoughts/plans, thoughts of violence, and unusual visions or sounds. Endo:  Denies cold intolerance and heat intolerance.  Physical Exam  General:  Well-developed,well-nourished,in no acute distress; alert,appropriate and cooperative throughout examination Head:  Normocephalic and atraumatic without obvious abnormalities. No apparent alopecia or balding. Eyes:  vision grossly intact, pupils equal, pupils round, pupils reactive to light, and no injection.   Ears:  R ear normal and L ear normal.   Nose:  nares are injected and congested bilaterally  Mouth:  pharynx pink and moist, no erythema, and no exudates.   Breasts:  No mass, nodules, thickening, tenderness, bulging, retraction, inflamation, nipple discharge or skin changes noted.   Lungs:  diffusely distant bs with very scant wheeze on forced exp  no rales or rhonchi  Heart:  normal rate, regular rhythm, and no murmur.   Abdomen:  Bowel sounds positive,abdomen soft and non-tender without masses, organomegaly or hernias noted.  Genitalia:  Pelvic Exam:        External: normal female genitalia without lesions or masses        Vagina: normal without lesions or masses        Cervix: absent        Adnexa: normal bimanual exam without masses or fullness        Uterus: absent        Pap smear: performed Extremities:  no edema Neurologic:  alert & oriented X3 and gait normal.   Skin:  Intact without suspicious lesions or rashes Psych:  normal affect, talkative and  pleasant    Impression & Recommendations:  Problem # 1:  HEALTH SCREENING (ICD-V70.0) Reviewed preventive care protocols, scheduled due services, and updated immunizations Discussed nutrition, exercise, diet, and healthy lifestyle.  Pneumovax today, added her to Zostavax waitlist. Set up DEXA scan today. Discussed colonoscopy, she will think about it.  Problem # 2:  COPD (ICD-496) Assessment: Deteriorated Started Advair daily, continue pro air as rescue inhaler. Her updated medication list for this problem  includes:    Ventolin Hfa 108 (90 Base) Mcg/act Aers (Albuterol sulfate) .Marland Kitchen..Marland Kitchen Two puffs four times a day as needed    Advair Diskus 100-50 Mcg/dose Misc (Fluticasone-salmeterol) .Marland Kitchen... 1 puff 2 times daily  Problem # 3:  DIABETES-TYPE 2 (ICD-250.00) Assessment: Unchanged Recheck a1c in 2 months. Her updated medication list for this problem includes:    Glipizide Xl 10 Mg Xr24h-tab (Glipizide) .Marland Kitchen... Take 1 once daily for diabetes  Complete Medication List: 1)  Preservision Areds Caps (Multiple vitamins-minerals) .... Take 1 capsule by mouth once a day 2)  Effexor 37.5 Mg Tabs (Venlafaxine hcl) .... 1/2 tab two times a day 3)  Alprazolam 0.5 Mg Tabs (Alprazolam) .... 1/2 tab two times a day 4)  Eq Omeprazole 20 Mg Tbec (Omeprazole) .... Take 1 tablet by mouth once a day as needed 5)  Allegra-d 12 Hour 60-120 Mg Tb12 (Fexofenadine-pseudoephedrine) .... Take 1 tablet by mouth every 12 hours as needed 6)  Simvastatin 80 Mg Tabs (Simvastatin) .Marland Kitchen.. 1 each evening for cholesterol 7)  Aleve 220 Mg Tabs (Naproxen sodium) .... Otc as directed 8)  Glipizide Xl 10 Mg Xr24h-tab (Glipizide) .... Take 1 once daily for diabetes 9)  Ventolin Hfa 108 (90 Base) Mcg/act Aers (Albuterol sulfate) .... Two puffs four times a day as needed 10)  Advair Diskus 100-50 Mcg/dose Misc (Fluticasone-salmeterol) .Marland Kitchen.. 1 puff 2 times daily  Other Orders: Pap Smear, Thin Prep ( Collection of)  (U2025) Pneumococcal Vaccine (42706) Admin 1st Vaccine (23762) Radiology Referral (Radiology)  Patient Instructions: 1)  Very nice to meet you, Ms. Sara Stafford. 2)  When you come back in June, we will check your cholesterol and your diabetes. Prescriptions: ADVAIR DISKUS 100-50 MCG/DOSE MISC (FLUTICASONE-SALMETEROL) 1 puff 2 times daily  #1 x 3   Entered and Authorized by:   Ruthe Mannan MD   Signed by:   Ruthe Mannan MD on 09/17/2009   Method used:   Electronically to        [facility]* (retail)       [address] [place], [place]  [address]       Ph: [phone]       Fax: [phone]   RxID:   [id number]   Current Allergies (reviewed today): ! DOXYCYCLINE  Pneumovax Result Date:  09/17/2009 Pneumovax Result:  given Pneumovax Next Due:  Not Indicated Last Mammogram:  normal (07/17/2007 5:05:57 PM) Mammogram Result Date:  09/03/2009 Mammogram Result:  normal Mammogram Next Due:  6 mo   Immunizations Administered:  Pneumonia Vaccine:    Vaccine Type: Pneumovax    Site: left deltoid    Mfr: Merck    Dose: 0.5 ml    Route: IM    Given by: Linde Gillis CMA (AAMA)    Exp. Date: 03/24/2011    Lot #: [id number]    VIS given: 12/26/95 version given September 17, 2009.

## 2010-06-29 NOTE — Assessment & Plan Note (Signed)
Summary: SINUS/DLO   Vital Signs:  Patient profile:   65 year old female Height:      64.5 inches Weight:      142 pounds BMI:     24.08 O2 Sat:      99 % on Room air Temp:     97.9 degrees F oral Pulse rate:   84 / minute Pulse rhythm:   regular BP sitting:   140 / 80  (right arm) Cuff size:   regular  Vitals Entered By: Linde Gillis CMA Duncan Dull) (March 30, 2010 11:46 AM)  O2 Flow:  Room air CC: sinus problems   History of Present Illness: CC: "croupy"  65 yo smoker, COPD .  Coughing about same as normal.  Not more SOB.  Slight more mucous production.  Had episode of reflux last night where she spit up acid.  No sinus pressure/pain, no fevers/chills, no abd pain, n/v/d. no fevers.Marland Kitchen eating ok.  drinking plenty.  gargling with salt water, inhaling vicks steam, drinking hot teas.  taking zyrtec, mucinex.   seen last month 10/21, and on  9/13 for sinus infection.  cleared after zpack both times.  lair.  + smoking (1 ppd).  + h/o allergies.  On advair but not taking nor spiriva (only very intermittently).  takes albuterol as needed.    Still having largyngitis, wants something to make it better.  Feels it has been going on too long.    Allergies: 1)  ! Doxycycline  Review of Systems      See HPI General:  Denies chills and fever. CV:  Denies chest pain or discomfort. Resp:  Complains of cough.  Physical Exam  General:  Well-developed,well-nourished,in no acute distress; alert,appropriate and cooperative throughout examination, hoarse Ears:  R ear normal and L ear normal.   Nose:  nares are injected and congested bilaterally  Mouth:  pharynx pink and moist, and no exudates.  some clear post nasal drip, mild pharyngeal erythema Lungs:  slight coarse breath sounds, minimal exp wheeze Heart:  less tachycardic today, no m/r/g.   Extremities:  no edema Skin:  Intact without suspicious lesions or rashes + heailng burn R arm from iron Psych:  nl affect    Impression &  Recommendations:  Problem # 1:  LARYNGITIS, ACUTE (ICD-464.00) Assessment New Decadron IM x 1. Advised supportive care. Continue Allegra D, Advair.    Complete Medication List: 1)  Preservision Areds Caps (Multiple vitamins-minerals) .... Take 2 capsules by mouth in am and 2 capsules by mouth in pm. 2)  Effexor 37.5 Mg Tabs (Venlafaxine hcl) .... 1/2 tab two times a day 3)  Alprazolam 0.5 Mg Tabs (Alprazolam) .... 1/2 tab two times a day 4)  Eq Omeprazole 20 Mg Tbec (Omeprazole) .... Take 1 tablet by mouth once a day as needed 5)  Allegra-d 12 Hour 60-120 Mg Tb12 (Fexofenadine-pseudoephedrine) .... Take 1 tablet by mouth every 12 hours as needed 6)  Simvastatin 80 Mg Tabs (Simvastatin) .Marland Kitchen.. 1 each evening for cholesterol 7)  Aleve 220 Mg Tabs (Naproxen sodium) .... Otc as directed 8)  Glipizide Xl 10 Mg Xr24h-tab (Glipizide) .... Take 1 once daily for diabetes 9)  Ventolin Hfa 108 (90 Base) Mcg/act Aers (Albuterol sulfate) .... Two puffs four times a day as needed 10)  Spiriva Handihaler 18 Mcg Caps (Tiotropium bromide monohydrate) .... Use one ampule daily as needed. 11)  Advair Diskus 100-50 Mcg/dose Aepb (Fluticasone-salmeterol) .... One puff two times daily   Orders Added: 1)  Est. Patient Level IV [04540]    Current Allergies (reviewed today): ! DOXYCYCLINE  Appended Document: SINUS/DLO     Allergies: 1)  ! Doxycycline   Complete Medication List: 1)  Preservision Areds Caps (Multiple vitamins-minerals) .... Take 2 capsules by mouth in am and 2 capsules by mouth in pm. 2)  Effexor 37.5 Mg Tabs (Venlafaxine hcl) .... 1/2 tab two times a day 3)  Alprazolam 0.5 Mg Tabs (Alprazolam) .... 1/2 tab two times a day 4)  Eq Omeprazole 20 Mg Tbec (Omeprazole) .... Take 1 tablet by mouth once a day as needed 5)  Allegra-d 12 Hour 60-120 Mg Tb12 (Fexofenadine-pseudoephedrine) .... Take 1 tablet by mouth every 12 hours as needed 6)  Simvastatin 80 Mg Tabs (Simvastatin) .Marland Kitchen.. 1 each  evening for cholesterol 7)  Aleve 220 Mg Tabs (Naproxen sodium) .... Otc as directed 8)  Glipizide Xl 10 Mg Xr24h-tab (Glipizide) .... Take 1 once daily for diabetes 9)  Ventolin Hfa 108 (90 Base) Mcg/act Aers (Albuterol sulfate) .... Two puffs four times a day as needed 10)  Spiriva Handihaler 18 Mcg Caps (Tiotropium bromide monohydrate) .... Use one ampule daily as needed. 11)  Advair Diskus 100-50 Mcg/dose Aepb (Fluticasone-salmeterol) .... One puff two times daily  Other Orders: Dexamethasone Sodium Phosphate 1mg  (J1100) Admin of Therapeutic Inj  intramuscular or subcutaneous (98119)   Medication Administration  Injection # 1:    Medication: Dexamethasone Sodium Phosphate 1mg     Diagnosis: LARYNGITIS, ACUTE (ICD-464.00)    Route: IM    Site: LUOQ gluteus    Exp Date: 09/28/2011    Lot #: [id number]    Mfr: Baxter    Patient tolerated injection without complications    Given by: Linde Gillis CMA Duncan Dull) (March 30, 2010 12:37 PM)  Orders Added: 1)  Dexamethasone Sodium Phosphate 1mg  [J1100] 2)  Admin of Therapeutic Inj  intramuscular or subcutaneous [56213]

## 2010-06-29 NOTE — Progress Notes (Signed)
Summary: wants antibiotic  Phone Note Call from Patient Call back at Home Phone [phone] Call back at [phone]   Caller: Patient Call For: Dr. Milinda Antis Summary of Call: Patient says that she has been very congested for the past week and has been taking mucinex and using her inhaler. She wanted to be seen today, but there are no app., I suggested tomorrow clinic, but she was hoping to just have something called in because she has easily came down with bronchitis in the past and does not want that to happen this time. I let her know that normally an antibiotic will not be called in, but that I would ask. She uses [facility].  Initial call taken by: Melody Comas,  July 24, 2009 2:28 PM  Follow-up for Phone Call        cannot call in abx  rec sat clinic -- or urgent care if unable  Follow-up by: Judith Part MD,  July 24, 2009 3:41 PM  Additional Follow-up for Phone Call Additional follow up Details #1::        Patient notified  by v/m on home phone and cell phone due to lateness of the dayas instructed by telephone. Lewanda Rife LPN  July 24, 2009 4:23 PM

## 2010-06-29 NOTE — Progress Notes (Signed)
Summary: pt doesn't want diabetic supplies from mail order company  Phone Note From Pharmacy Call back at [employer] [phone]   Caller: [facility] Summary of Call: Received faxed form for diabetic supplies, spoke with pt and she says she does not want these supplies. Initial call taken by: Lowella Petties CMA,  July 27, 2009 4:04 PM     Appended Document: pt doesn't want diabetic supplies from mail order company [facility] called, following up on supplies. Told them, per pt she did not want the supplies. Daine Gip 07-29-2009

## 2010-06-29 NOTE — Assessment & Plan Note (Signed)
Summary: laryngitis   Vital Signs:  Patient profile:   65 year old female Weight:      141.25 pounds Temp:     98.3 degrees F oral Pulse rate:   120 / minute Pulse rhythm:   regular BP sitting:   138 / 82  (left arm) Cuff size:   regular  Vitals Entered By: Selena Batten Dance CMA Duncan Dull) (March 19, 2010 10:31 AM) CC: Laryngitis   History of Present Illness: CC: "croupy"  65 yo smoker, COPD with 11d h/o coughing, L ear fullness, dizzy, hoarse voice.  production of clear mucous.  Coughing about same as normal.  Not more SOB.  Slight more mucous production.  Had episode of reflux last night where she spit up acid.  No sinus pressure/pain, no fevers/chills, no abd pain, n/v/d.  + possible fever last tuesday.  eating ok.  drinking plenty.  gargling with salt water, inhaling vicks steam, drinking hot teas.  taking zyrtec, mucinex.  received flu shot last tuesday.  seen last month 9/13 for sinus infection.  cleared after zpack.  wants singulair.  + smoking (1 ppd).  + h/o allergies.  On advair but not taking nor spiriva (only very intermittently).  takes albuterol as needed.  Stopped advair 2/2 throat irritation, wasn't rinsing after each use.  tachycardia - no hx of tachycardia, pt endorses feeling "panicky" because had 1+ hour drive to get here and was rushing.  no CP  Current Medications (verified): 1)  Preservision Areds   Caps (Multiple Vitamins-Minerals) .... Take 2 Capsules By Mouth in Am and 2 Capsules By Mouth in Pm. 2)  Effexor 37.5 Mg  Tabs (Venlafaxine Hcl) .... 1/2 Tab Two Times A Day 3)  Alprazolam 0.5 Mg  Tabs (Alprazolam) .... 1/2 Tab Two Times A Day 4)  Eq Omeprazole 20 Mg  Tbec (Omeprazole) .... Take 1 Tablet By Mouth Once A Day As Needed 5)  Allegra-D 12 Hour 60-120 Mg  Tb12 (Fexofenadine-Pseudoephedrine) .... Take 1 Tablet By Mouth Every 12 Hours As Needed 6)  Simvastatin 80 Mg Tabs (Simvastatin) .Marland Kitchen.. 1 Each Evening For Cholesterol 7)  Aleve 220 Mg Tabs (Naproxen Sodium) ....  Otc As Directed 8)  Glipizide Xl 10 Mg Xr24h-Tab (Glipizide) .... Take 1 Once Daily For Diabetes 9)  Ventolin Hfa 108 (90 Base) Mcg/act Aers (Albuterol Sulfate) .... Two Puffs Four Times A Day As Needed 10)  Spiriva Handihaler 18 Mcg Caps (Tiotropium Bromide Monohydrate) .... Use One Ampule Daily As Needed. 11)  Advair Diskus 100-50 Mcg/dose Aepb (Fluticasone-Salmeterol) .... One Puff Two Times Daily  Allergies: 1)  ! Doxycycline  Past History:  Past Medical History: Last updated: 10/11/2007 Current Problems:  COPD (ICD-496) HEALTH SCREENING (ICD-V70.0) GERD (ICD-530.1) HYPERLIPIDEMIA (ICD-272.2) FIBROMYALGIA (ICD-729.1) FATIGUE (ICD-780.79) DEPRESSIVE DISORDER (ICD-311) DIABETES-TYPE 2 (ICD-250.00)    Social History: Last updated: 10/11/2007 Marital Status: Married Children: 2---39 and 35, expecting new grand child, has 1 Occupation: not working Current Smoker 45 years 1 ppd PMH-FH-SH reviewed for relevance  Review of Systems       per HPI  Physical Exam  General:  Well-developed,well-nourished,in no acute distress; alert,appropriate and cooperative throughout examination Head:  normocephalic, atraumatic, and no abnormalities observed.  some mild maxillary sinus tenderness L>R Eyes:  vision grossly intact, pupils equal, pupils round, and pupils reactive to light.  no conjunctival pallor, injection or icterus  Ears:  R ear normal and L ear normal.   Nose:  nares are injected and congested bilaterally  Mouth:  pharynx pink  and moist, and no exudates.  some clear post nasal drip, mild pharyngeal erythema Lungs:  slight coarse breath sounds, minimal exp wheeze RLL Heart:  tachycardic, no m/r/g.   Pulses:  2+ rad pulses Extremities:  no edema Skin:  Intact without suspicious lesions or rashes + heailng burn R arm from iron   Impression & Recommendations:  Problem # 1:  SINUSITIS - ACUTE-NOS (ICD-461.9) treat as sinus infection with zpack given tender sinus, 11+ day  hx.  red flags to return discussed.  The following medications were removed from the medication list:    Zithromax Z-pak 250 Mg Tabs (Azithromycin) .Marland Kitchen... Take by mouth as directed Her updated medication list for this problem includes:    Allegra-d 12 Hour 60-120 Mg Tb12 (Fexofenadine-pseudoephedrine) .Marland Kitchen... Take 1 tablet by mouth every 12 hours as needed    Zithromax Z-pak 250 Mg Tabs (Azithromycin) .Marland Kitchen... Take as directed  Problem # 2:  COPD (ICD-496) not acute exac.  poor control given not on controller medicine consistently.  recommended restart advair, rinse after each use, see if throat irritation continues, discuss with PCP in 2-3 wk at f/u appt.  Her updated medication list for this problem includes:    Ventolin Hfa 108 (90 Base) Mcg/act Aers (Albuterol sulfate) .Marland Kitchen..Marland Kitchen Two puffs four times a day as needed    Spiriva Handihaler 18 Mcg Caps (Tiotropium bromide monohydrate) ..... Use one ampule daily as needed.    Advair Diskus 100-50 Mcg/dose Aepb (Fluticasone-salmeterol) ..... One puff two times daily  Pulmonary Functions Reviewed: O2 sat: 97 (02/09/2010)     Vaccines Reviewed: Pneumovax: given (09/17/2009)   Flu Vax: Fluvax 3+ (03/09/2010)  Problem # 3:  GERD (ICD-530.1) hoarseness could also be from stopping omeprazole.  pt requests refil today.  refilled.  rec restart omeprazole.  Complete Medication List: 1)  Preservision Areds Caps (Multiple vitamins-minerals) .... Take 2 capsules by mouth in am and 2 capsules by mouth in pm. 2)  Effexor 37.5 Mg Tabs (Venlafaxine hcl) .... 1/2 tab two times a day 3)  Alprazolam 0.5 Mg Tabs (Alprazolam) .... 1/2 tab two times a day 4)  Eq Omeprazole 20 Mg Tbec (Omeprazole) .... Take 1 tablet by mouth once a day as needed 5)  Allegra-d 12 Hour 60-120 Mg Tb12 (Fexofenadine-pseudoephedrine) .... Take 1 tablet by mouth every 12 hours as needed 6)  Simvastatin 80 Mg Tabs (Simvastatin) .Marland Kitchen.. 1 each evening for cholesterol 7)  Aleve 220 Mg Tabs  (Naproxen sodium) .... Otc as directed 8)  Glipizide Xl 10 Mg Xr24h-tab (Glipizide) .... Take 1 once daily for diabetes 9)  Ventolin Hfa 108 (90 Base) Mcg/act Aers (Albuterol sulfate) .... Two puffs four times a day as needed 10)  Spiriva Handihaler 18 Mcg Caps (Tiotropium bromide monohydrate) .... Use one ampule daily as needed. 11)  Advair Diskus 100-50 Mcg/dose Aepb (Fluticasone-salmeterol) .... One puff two times daily 12)  Zithromax Z-pak 250 Mg Tabs (Azithromycin) .... Take as directed  Patient Instructions: 1)  Come back to see Dr. Dayton Martes in next 2-3 wks for COPD follow up and to ensure you are feeling better. 2)  I think this could be sinus infection.  Treat with course of antibiotics for next 5 days. 3)  Restart omeprazole (reflux could worsen hoarseness) 4)  Restart advair (rinse after each use). 5)  Good to see you today, call clinic with quesitons Prescriptions: ZITHROMAX Z-PAK 250 MG TABS (AZITHROMYCIN) take as directed  #1 x 0   Entered and Authorized by:  Eustaquio Boyden  MD   Signed by:   Eustaquio Boyden  MD on 03/19/2010   Method used:   Print then Give to Patient   RxID:   [id number] EQ OMEPRAZOLE 20 MG  TBEC (OMEPRAZOLE) Take 1 tablet by mouth once a day as needed  #30 Capsule x 11   Entered and Authorized by:   Eustaquio Boyden  MD   Signed by:   Eustaquio Boyden  MD on 03/19/2010   Method used:   Print then Give to Patient   RxID:   [id number]    Orders Added: 1)  Est. Patient Level III [62952]    Current Allergies (reviewed today): ! DOXYCYCLINE

## 2010-06-29 NOTE — Assessment & Plan Note (Signed)
Summary: rash on face/nt   Vital Signs:  Patient profile:   65 year old female Height:      64.5 inches Weight:      144 pounds BMI:     24.42 Temp:     97.9 degrees F oral Pulse rate:   88 / minute Pulse rhythm:   regular BP sitting:   134 / 78  (left arm) Cuff size:   regular  History of Present Illness: has had a cold for about 3 weeks  was color d/c- now clear one nostril is burning and itching  is taking mucinex  occ allegra D(keeps her awake at night )  much head congestion and facial pain  over her R side of face next to nose  is stinging or itching  thinks she has a bit of rash   had shingles on hip in past    had cold sore on lip for 3 weeks   did have some chest congestion- is getting better  some mucous -- very little clear   had fever with sweats last fri too   Allergies: 1)  ! Doxycycline  Past History:  Past Medical History: Last updated: Oct 27, 2007 Current Problems:  COPD (ICD-496) HEALTH SCREENING (ICD-V70.0) GERD (ICD-530.1) HYPERLIPIDEMIA (ICD-272.2) FIBROMYALGIA (ICD-729.1) FATIGUE (ICD-780.79) DEPRESSIVE DISORDER (ICD-311) DIABETES-TYPE 2 (ICD-250.00)    Family History: Last updated: Oct 27, 2007 Father: died at 80--CVA, DM,  Mother: died at 100--aspirated Siblings: 9 sis--1 died at 90 of liver ca                          1 died  at 55 of pancreatic ca                          7 living---DM,                1 br--died of brain ca  DM-PGM MI-0 CVA- 0 Prostate Cancer-0 Breast Cancer-Maunt Ovarian Cancer-0 Uterine Cancer-0 Colon Cancer-0 Drug/ ETOH Abuse-0 Depression-0    Social History: Last updated: 10-27-07 Marital Status: Married Children: 2---39 and 35, expecting new grand child, has 1 Occupation: not working Current Smoker 45 years 1 ppd  Risk Factors: Caffeine Use: 3 (06/04/2007) Exercise: no (06/04/2007)  Risk Factors: Smoking Status: current (11/25/2008) Packs/Day: 1.0 (11/25/2008) Passive Smoke  Exposure: yes (06/04/2007)  Review of Systems General:  Complains of chills and fatigue; denies malaise. Eyes:  Complains of eye pain; denies blurring, discharge, and eye irritation. ENT:  Complains of hoarseness, nasal congestion, postnasal drainage, and sinus pressure. CV:  Denies chest pain or discomfort and palpitations. Resp:  Complains of cough; denies shortness of breath and sputum productive. GI:  Denies abdominal pain, diarrhea, nausea, and vomiting. Derm:  Complains of rash; denies itching. Neuro:  Complains of headaches; denies numbness and tingling.  Physical Exam  General:  Well-developed,well-nourished,in no acute distress; alert,appropriate and cooperative throughout examination Head:  tender R frontal/ ethmoid and maxillary sinus  some hyperemia of cheek without rash or swelling Eyes:  vision grossly intact, pupils equal, pupils round, pupils reactive to light, and no injection.   Ears:  R ear normal and L ear normal.   Nose:  nares are injected and congested bilaterally  Mouth:  pharynx pink and moist, no erythema, and no exudates.   Neck:  supple with full rom and no masses or thyromegally, no JVD or carotid bruit  Lungs:  diffusely distant bs with very  scant wheeze on forced exp  no rales or rhonchi  Heart:  normal rate, regular rhythm, and no murmur.   Skin:  Intact without suspicious lesions or rashes Cervical Nodes:  No lymphadenopathy noted Psych:  normal affect, talkative and pleasant    Impression & Recommendations:  Problem # 1:  SINUSITIS - ACUTE-NOS (ICD-461.9) Assessment New with facial pain  suspect facial redness ( with no discernable rash) is actually from heat she used on it  will cover with augmentin and update  recommend sympt care- see pt instructions   may want to try saline irrigations did adv to quit smoking - pt does not seem interested at this time Her updated medication list for this problem includes:    Allegra-d 12 Hour 60-120 Mg  Tb12 (Fexofenadine-pseudoephedrine) .Marland Kitchen... Take 1 tablet by mouth every 12 hours as needed    Augmentin 875-125 Mg Tabs (Amoxicillin-pot clavulanate) .Marland Kitchen... 1 by mouth two times a day for 10 days  Complete Medication List: 1)  Preservision Areds Caps (Multiple vitamins-minerals) .... Take 1 capsule by mouth once a day 2)  Effexor 37.5 Mg Tabs (Venlafaxine hcl) .... 1/2 tab two times a day 3)  Alprazolam 0.5 Mg Tabs (Alprazolam) .... 1/2 tab two times a day 4)  Eq Omeprazole 20 Mg Tbec (Omeprazole) .... Take 1 tablet by mouth once a day as needed 5)  Allegra-d 12 Hour 60-120 Mg Tb12 (Fexofenadine-pseudoephedrine) .... Take 1 tablet by mouth every 12 hours as needed 6)  Simvastatin 80 Mg Tabs (Simvastatin) .Marland Kitchen.. 1 each evening for cholesterol 7)  Aleve 220 Mg Tabs (Naproxen sodium) .... Otc as directed 8)  Glipizide Xl 10 Mg Xr24h-tab (Glipizide) .... Take 1 once daily for diabetes 9)  Ventolin Hfa 108 (90 Base) Mcg/act Aers (Albuterol sulfate) .... Two puffs four times a day as needed 10)  Augmentin 875-125 Mg Tabs (Amoxicillin-pot clavulanate) .Marland Kitchen.. 1 by mouth two times a day for 10 days  Patient Instructions: 1)  try aleve or tylenol for facial pain or chills (always take aleve with food) 2)  drink lots of fluids 3)  mucinex helps congestion 4)  heat to face and breathing steam help nasal congestion  5)  nasal saline spray or netti pot may also help  6)  take augmentin for sinus infection as directed  7)  please update me if not starting to improve in a week  8)  update me if worse or any other changes -- especially if any blisters develop on face  Prescriptions: AUGMENTIN 875-125 MG TABS (AMOXICILLIN-POT CLAVULANATE) 1 by mouth two times a day for 10 days  #20 x 0   Entered and Authorized by:   Judith Part MD   Signed by:   Judith Part MD on 07/31/2009   Method used:   Print then Give to Patient   RxID:   [id number]   Current Allergies (reviewed today): ! DOXYCYCLINE

## 2010-07-01 NOTE — Progress Notes (Signed)
Summary: Rx Alprazolam  Phone Note Refill Request Call back at [phone] Message from:  [facility] on May 10, 2010 9:48 AM  Refills Requested: Medication #1:  ALPRAZOLAM 0.5 MG  TABS 1/2 tab two times a day   Last Refilled: 03/08/2010  Method Requested: Telephone to Pharmacy Initial call taken by: Sydell Axon LPN,  May 10, 2010 9:49 AM  Follow-up for Phone Call        Rx called to pharmacy Follow-up by: Sydell Axon LPN,  May 10, 2010 12:01 PM    Prescriptions: ALPRAZOLAM 0.5 MG  TABS (ALPRAZOLAM) 1/2 tab two times a day  #30 x 1   Entered and Authorized by:   Ruthe Mannan MD   Signed by:   Ruthe Mannan MD on 05/10/2010   Method used:   Telephoned to ...       [facility]  [address] [phone]* (retail)       [address]       [place], [place]  [address]       Ph: [phone] or [phone]       Fax: [phone]   RxID:   [id number]

## 2010-07-01 NOTE — Progress Notes (Signed)
Summary: alprazolam   Phone Note Refill Request Message from:  August 29, 2043  Refills Requested: Medication #1:  ALPRAZOLAM 0.5 MG  TABS 1/2 tab two times a day   Last Refilled: 06/07/2010 Refill request from Suella Broad [phone]  Initial call taken by: Melody Comas,  June 09, 2010 10:44 AM  Follow-up for Phone Call        Rx called to pharmacy Follow-up by: Linde Gillis CMA [facility]),  June 09, 2010 11:02 AM    Prescriptions: ALPRAZOLAM 0.5 MG  TABS (ALPRAZOLAM) 1/2 tab two times a day  #30 x 1   Entered and Authorized by:   Ruthe Mannan MD   Signed by:   Ruthe Mannan MD on 06/09/2010   Method used:   Telephoned to ...       [facility]  [address] [phone]* (retail)       [address]       [place], [place]  [address]       Ph: [phone]       Fax: [phone]   RxID:   [id number]

## 2010-07-07 NOTE — Assessment & Plan Note (Signed)
Summary: consult for copd   Copy to:  Chrstine Gusler at [facility] Primary Provider/Referring Provider:  Loetta Rough at [facility]  CC:  Pulmonary Consult.  History of Present Illness: The pt is a 65y/o female who I have been asked to see for obstructive lung disease.  She has a longstanding h/o tobacco abuse, and feels that she has been sick for months.  She quit smoking on Jan 8th, and has seen significant improvement in her symptoms since that time.  She currently feels her breathing is doing well, and denies sob bringing groceries in from the car or doing her housework.  She feels she can walk many blocks on flat ground at moderate pace currently without getting sob.  She denies any cough, congestion, or mucus production.  She has had spirometry the beginning of the month which showed very severe obstruction, but the pt tells me she was very sick at the time.  She has been tried on spiriva in the past, but did not feel it helped very much.  She was on advair which helped, but had significant upper airway irritation.  She currently is on dulera, and is tolerating well.  She also has issues with allergic rhinitis, and is being treated with singulair and zyrtec.  She has had a recent cxr the end of last year that was unremarkable.  Current Medications (verified): 1)  Effexor 37.5 Mg  Tabs (Venlafaxine Hcl) .... 1/2 Tab Two Times A Day 2)  Alprazolam 0.5 Mg  Tabs (Alprazolam) .... 1/2 Tab Two Times A Day 3)  Eq Omeprazole 20 Mg  Tbec (Omeprazole) .... Take 1 Tablet By Mouth Once A Day As Needed 4)  Simvastatin 80 Mg Tabs (Simvastatin) .Marland Kitchen.. 1 Each Evening For Cholesterol 5)  Aleve 220 Mg Tabs (Naproxen Sodium) .... Otc As Directed 6)  Glipizide Xl 10 Mg Xr24h-Tab (Glipizide) .... Take 2 Tabs By Mouth Daily 7)  Ventolin Hfa 108 (90 Base) Mcg/act Aers (Albuterol Sulfate) .... Two Puffs Four Times A Day As Needed 8)  Zyrtec Allergy 10 Mg Tabs (Cetirizine Hcl) .... Take 1 Tablet By  Mouth Once A Day 9)  Singulair 10 Mg Tabs (Montelukast Sodium) .... Take 1 Tablet By Mouth Once A Day 10)  Dulera 200-5 Mcg/act Aero (Mometasone Furo-Formoterol Fum) .... Inhale 2 Puffs Two Times A Day  Allergies (verified): 1)  ! Doxycycline  Past History:  Past Medical History:  COPD (ICD-496) HEALTH SCREENING (ICD-V70.0) GERD (ICD-530.1) HYPERLIPIDEMIA (ICD-272.2) FIBROMYALGIA (ICD-729.1) FATIGUE (ICD-780.79) DEPRESSIVE DISORDER (ICD-311) DIABETES-TYPE 2 (ICD-250.00)    Past Surgical History: partial hysterectomy  Family History: Reviewed history from 10/11/2007 and no changes required. Father: died at 80--CVA, DM, Asthma Mother: died at 100--aspirated Siblings: 9 sis--1 died at 52 of liver ca                          1 died  at 53 of pancreatic ca                          7 living---DM,                1 br--died of brain ca  DM-PGM MI-0 CVA- 0 Prostate Cancer-0 Breast Cancer-Maunt Ovarian Cancer-0 Uterine Cancer-0 Colon Cancer-0 Drug/ ETOH Abuse-0 Depression-0    Social History: Reviewed history from 10/11/2007 and no changes required. Marital Status: Married Children: 2---39 and 35, expecting new grand child, has 1 Occupation: retired.  prev worked at [employer] Former smoker: Started at age 89.  1 ppd.  quit Jun 06, 2010.  Review of Systems       The patient complains of shortness of breath with activity, productive cough, acid heartburn, and sore throat.  The patient denies shortness of breath at rest, non-productive cough, coughing up blood, chest pain, irregular heartbeats, indigestion, loss of appetite, weight change, abdominal pain, difficulty swallowing, tooth/dental problems, headaches, nasal congestion/difficulty breathing through nose, sneezing, itching, ear ache, anxiety, depression, hand/feet swelling, joint stiffness or pain, rash, change in color of mucus, and fever.    Vital Signs:  Patient profile:   65 year old female Height:      64.5  inches Weight:      143.25 pounds BMI:     24.30 O2 Sat:      96 % on Room air Temp:     98.3 degrees F oral Pulse rate:   110 / minute BP sitting:   150 / 88  (left arm) Cuff size:   regular  Vitals Entered By: Arman Filter LPN (June 28, 2010 1:59 PM)  O2 Flow:  Room air CC: Pulmonary Consult Comments Medications reviewed with patient  Arman Filter LPN  June 28, 2010 2:04 PM    Physical Exam  General:  wd female in nad Eyes:  PERRLA and EOMI.   Nose:  patent without discharge Mouth:  clear, no exudates or lesions seen. Neck:  no jvd, tmg, LN Lungs:  midly decreased bs throughout, no wheezing or rhonchi Heart:  rrr, no mrg Abdomen:  soft and nontender, bs+ Extremities:  no edema or cyanosis, pulses intact distally Neurologic:  alert and oriented, moves all 4    Impression & Recommendations:  Problem # 1:  COPD (ICD-496) the pt has a h/o severe airflow obstruction by spirometry the beginning of the month, but tells me she was very sick at the time.  I suspect she does have emphysema, but it is unclear how much.  She is obviously much improved since totally quitting smoking, and I suspect this has helped her more than any medication we could put her on.  She had issues with upper airway irritation with advair, but has done better with dulera.  She should be able to maintain on the lower strength of dulera though.  At this point, would like to check full pfts with lung volumes and DLCO to see how much of this is emphysema vs asthma.  I suspect the former is the major player, but will withold judgement until I see her pfts.  I have congratulated her on smoking cessation, and asked her to continue.  Medications Added to Medication List This Visit: 1)  Glipizide Xl 10 Mg Xr24h-tab (Glipizide) .... Take 2 tabs by mouth daily 2)  Zyrtec Allergy 10 Mg Tabs (Cetirizine hcl) .... Take 1 tablet by mouth once a day 3)  Singulair 10 Mg Tabs (Montelukast sodium) .... Take 1 tablet  by mouth once a day 4)  Dulera 200-5 Mcg/act Aero (Mometasone furo-formoterol fum) .... Inhale 2 puffs two times a day  Other Orders: Consultation Level IV (16109) Pulmonary Referral (Pulmonary)  Patient Instructions: 1)  will decrease dulera to 100/5 strength...2 inhalations am and pm..rinse mouth well. 2)  stay as active as possible 3)  will schedule for breathing studies, and see you back same day to review with you.

## 2010-07-27 ENCOUNTER — Encounter (INDEPENDENT_AMBULATORY_CARE_PROVIDER_SITE_OTHER): Payer: Medicare Other

## 2010-07-27 ENCOUNTER — Ambulatory Visit (INDEPENDENT_AMBULATORY_CARE_PROVIDER_SITE_OTHER): Payer: Medicare Other | Admitting: Pulmonary Disease

## 2010-07-27 ENCOUNTER — Encounter: Payer: Self-pay | Admitting: Pulmonary Disease

## 2010-07-27 DIAGNOSIS — J449 Chronic obstructive pulmonary disease, unspecified: Secondary | ICD-10-CM

## 2010-08-10 NOTE — Assessment & Plan Note (Signed)
Summary: rov for f/u of emphysema   Copy to:  Chrstine Gusler at [facility] Primary Provider/Referring Provider:  Loetta Rough at [facility]  CC:  followup on pfts test.  History of Present Illness: the pt comes in today for f/u after her pfts.  She was found to have severe airflow obstruction, but did have a 14% improvement with bronchodilators.  She had no restriction, but did have severe airtrapping.  Her dlco was mildly reduced.  The pt has continued to do well on dulera at lower strength.  She is stayig active, and denies any cough or congestion.  She has continued to stay off cigarettes.    Current Medications (verified): 1)  Effexor 37.5 Mg  Tabs (Venlafaxine Hcl) .... 1/2 Tab Two Times A Day 2)  Alprazolam 0.5 Mg  Tabs (Alprazolam) .... 1/2 Tab Two Times A Day 3)  Eq Omeprazole 20 Mg  Tbec (Omeprazole) .... Take 1 Tablet By Mouth Once A Day As Needed 4)  Simvastatin 80 Mg Tabs (Simvastatin) .Marland Kitchen.. 1 Each Evening For Cholesterol 5)  Aleve 220 Mg Tabs (Naproxen Sodium) .... Otc As Directed 6)  Glipizide Xl 10 Mg Xr24h-Tab (Glipizide) .... Take 2 Tabs By Mouth Daily 7)  Ventolin Hfa 108 (90 Base) Mcg/act Aers (Albuterol Sulfate) .... Two Puffs Four Times A Day As Needed 8)  Zyrtec Allergy 10 Mg Tabs (Cetirizine Hcl) .... Take 1 Tablet By Mouth Once A Day 9)  Singulair 10 Mg Tabs (Montelukast Sodium) .... Take 1 Tablet By Mouth Once A Day 10)  Dulera 100-5 Mcg/act Aero (Mometasone Furo-Formoterol Fum) .... 2 Puff 2 Times A Day  Allergies: 1)  ! Doxycycline  Past History:  Past Medical History:  COPD (ICD-496)--no change with spiriva, poor tolerance of advair HEALTH SCREENING (ICD-V70.0) GERD (ICD-530.1) HYPERLIPIDEMIA (ICD-272.2) FIBROMYALGIA (ICD-729.1) FATIGUE (ICD-780.79) DEPRESSIVE DISORDER (ICD-311) DIABETES-TYPE 2 (ICD-250.00)    Review of Systems       The patient complains of acid heartburn and joint stiffness or pain.  The patient denies shortness of  breath with activity, shortness of breath at rest, productive cough, non-productive cough, coughing up blood, chest pain, irregular heartbeats, indigestion, loss of appetite, weight change, abdominal pain, difficulty swallowing, sore throat, tooth/dental problems, headaches, nasal congestion/difficulty breathing through nose, sneezing, itching, ear ache, anxiety, depression, hand/feet swelling, rash, change in color of mucus, and fever.    Vital Signs:  Patient profile:   65 year old female Height:      64 inches Weight:      151 pounds BMI:     26.01 O2 Sat:      98 % on Room air Temp:     98.3 degrees F oral Pulse rate:   111 / minute BP sitting:   160 / 80  (right arm) Cuff size:   regular  Vitals Entered By: Kandice Hams CMA (July 27, 2010 2:07 PM)  O2 Flow:  Room air CC: followup on pfts test Comments pharmacy verfied   Physical Exam  General:  wd female in nad Lungs:  decreased bs, no wheezing  Heart:  rrr Extremities:  no edema or cyanosis  Neurologic:  alert and oriented, moves all 4    Impression & Recommendations:  Problem # 1:  COPD (ICD-496) the pt has severe airflow obstruction on pfts, but feels the dulera is helping her.  I have asked her to stay on this, along with albuterol for rescue.  I also think she would benefit from pulmonary rehab, and the  pt is willing to attend.  Will see her back in 6mos, or sooner if having issues.    Medications Added to Medication List This Visit: 1)  Dulera 100-5 Mcg/act Aero (Mometasone furo-formoterol fum) .... 2 puff 2 times a day 2)  Dulera 100-5 Mcg/act Aero (Mometasone furo-formoterol fum) .... 2 puffs twice per day  Other Orders: Carbon Monoxide diffusing w/capacity (04540) Lung Volumes/Gas dilution or washout (98119) Spirometry (Pre & Post) (94060) Est. Patient Level III (14782) Rehabilitation Referral (Rehab)  Patient Instructions: 1)  continue on dulera as you are doing. Can use albuterol if needed. 2)  stay  as active as possible 3)  will send you to pulmonary rehab at [facility]. 4)  If you are doing well, will see you back in 6mos.  Please call if having worsening breathing issues.   Prescriptions: DULERA 100-5 MCG/ACT AERO (MOMETASONE FURO-FORMOTEROL FUM) 2 puffs twice per day  #1 x 6   Entered by:   Barbaraann Share MD   Authorized by:   Marland Kitchen LPUDEFAULTPROVIDER   Signed by:   Barbaraann Share MD on 07/27/2010   Method used:   Print then Give to Patient   RxID:   [id number]

## 2010-08-13 ENCOUNTER — Encounter: Payer: Self-pay | Admitting: Family Medicine

## 2010-08-16 ENCOUNTER — Encounter (HOSPITAL_COMMUNITY): Payer: BC Managed Care – PPO | Attending: Pulmonary Disease

## 2010-08-16 DIAGNOSIS — Z5189 Encounter for other specified aftercare: Secondary | ICD-10-CM | POA: Insufficient documentation

## 2010-08-16 DIAGNOSIS — J449 Chronic obstructive pulmonary disease, unspecified: Secondary | ICD-10-CM | POA: Diagnosis not present

## 2010-08-16 DIAGNOSIS — J4489 Other specified chronic obstructive pulmonary disease: Secondary | ICD-10-CM | POA: Insufficient documentation

## 2010-08-18 ENCOUNTER — Encounter (HOSPITAL_COMMUNITY): Payer: BC Managed Care – PPO

## 2010-08-18 DIAGNOSIS — Z5189 Encounter for other specified aftercare: Secondary | ICD-10-CM | POA: Diagnosis not present

## 2010-08-23 ENCOUNTER — Encounter (HOSPITAL_COMMUNITY): Payer: BC Managed Care – PPO

## 2010-08-23 DIAGNOSIS — Z5189 Encounter for other specified aftercare: Secondary | ICD-10-CM | POA: Diagnosis not present

## 2010-08-25 ENCOUNTER — Encounter (HOSPITAL_COMMUNITY): Payer: BC Managed Care – PPO

## 2010-08-25 DIAGNOSIS — Z5189 Encounter for other specified aftercare: Secondary | ICD-10-CM | POA: Diagnosis not present

## 2010-08-30 ENCOUNTER — Encounter (HOSPITAL_COMMUNITY): Payer: Medicare Other | Attending: Pulmonary Disease

## 2010-08-30 DIAGNOSIS — Z5189 Encounter for other specified aftercare: Secondary | ICD-10-CM | POA: Insufficient documentation

## 2010-08-30 DIAGNOSIS — J4489 Other specified chronic obstructive pulmonary disease: Secondary | ICD-10-CM | POA: Insufficient documentation

## 2010-08-30 DIAGNOSIS — J449 Chronic obstructive pulmonary disease, unspecified: Secondary | ICD-10-CM | POA: Insufficient documentation

## 2010-09-01 ENCOUNTER — Encounter (HOSPITAL_COMMUNITY): Payer: Medicare Other

## 2010-09-06 ENCOUNTER — Encounter (HOSPITAL_COMMUNITY): Payer: Medicare Other

## 2010-09-08 ENCOUNTER — Encounter (HOSPITAL_COMMUNITY): Payer: Medicare Other

## 2010-09-13 ENCOUNTER — Encounter (HOSPITAL_COMMUNITY): Payer: Medicare Other

## 2010-09-15 ENCOUNTER — Encounter (HOSPITAL_COMMUNITY): Payer: Medicare Other

## 2010-09-20 ENCOUNTER — Encounter (HOSPITAL_COMMUNITY): Payer: Medicare Other

## 2010-09-22 ENCOUNTER — Encounter (HOSPITAL_COMMUNITY): Payer: Medicare Other

## 2010-09-23 ENCOUNTER — Ambulatory Visit: Payer: Self-pay | Admitting: Family Medicine

## 2010-09-27 ENCOUNTER — Encounter (HOSPITAL_COMMUNITY): Payer: Medicare Other

## 2010-09-29 ENCOUNTER — Encounter (HOSPITAL_COMMUNITY): Payer: Medicare Other | Attending: Pulmonary Disease

## 2010-09-29 DIAGNOSIS — Z5189 Encounter for other specified aftercare: Secondary | ICD-10-CM | POA: Insufficient documentation

## 2010-09-29 DIAGNOSIS — J449 Chronic obstructive pulmonary disease, unspecified: Secondary | ICD-10-CM | POA: Insufficient documentation

## 2010-09-29 DIAGNOSIS — J4489 Other specified chronic obstructive pulmonary disease: Secondary | ICD-10-CM | POA: Insufficient documentation

## 2010-10-04 ENCOUNTER — Encounter (HOSPITAL_COMMUNITY): Payer: Medicare Other

## 2010-10-06 ENCOUNTER — Encounter (HOSPITAL_COMMUNITY): Payer: Medicare Other

## 2010-10-11 ENCOUNTER — Encounter (HOSPITAL_COMMUNITY): Payer: Medicare Other

## 2010-10-13 ENCOUNTER — Encounter (HOSPITAL_COMMUNITY): Payer: Medicare Other

## 2010-10-18 ENCOUNTER — Encounter (HOSPITAL_COMMUNITY): Payer: Medicare Other

## 2010-10-20 ENCOUNTER — Encounter (HOSPITAL_COMMUNITY): Payer: Medicare Other

## 2010-10-25 ENCOUNTER — Encounter (HOSPITAL_COMMUNITY): Payer: Medicare Other

## 2010-10-27 ENCOUNTER — Encounter (HOSPITAL_COMMUNITY): Payer: Medicare Other

## 2010-11-01 ENCOUNTER — Encounter (HOSPITAL_COMMUNITY): Payer: BC Managed Care – PPO | Attending: Pulmonary Disease

## 2010-11-01 DIAGNOSIS — J4489 Other specified chronic obstructive pulmonary disease: Secondary | ICD-10-CM | POA: Insufficient documentation

## 2010-11-01 DIAGNOSIS — J449 Chronic obstructive pulmonary disease, unspecified: Secondary | ICD-10-CM | POA: Insufficient documentation

## 2010-11-01 DIAGNOSIS — Z5189 Encounter for other specified aftercare: Secondary | ICD-10-CM | POA: Insufficient documentation

## 2010-11-03 ENCOUNTER — Encounter (HOSPITAL_COMMUNITY): Payer: BC Managed Care – PPO

## 2010-11-08 ENCOUNTER — Encounter (HOSPITAL_COMMUNITY): Payer: BC Managed Care – PPO

## 2010-11-10 ENCOUNTER — Encounter (HOSPITAL_COMMUNITY): Payer: BC Managed Care – PPO

## 2010-11-15 ENCOUNTER — Encounter (HOSPITAL_COMMUNITY): Payer: BC Managed Care – PPO

## 2010-11-17 ENCOUNTER — Encounter (HOSPITAL_COMMUNITY): Payer: BC Managed Care – PPO

## 2011-01-03 ENCOUNTER — Other Ambulatory Visit: Payer: Self-pay | Admitting: *Deleted

## 2011-01-03 MED ORDER — VENLAFAXINE HCL 37.5 MG PO TABS: ORAL_TABLET | ORAL | Status: DC

## 2011-01-03 NOTE — Telephone Encounter (Signed)
Rx called to pharmacy

## 2011-01-13 ENCOUNTER — Other Ambulatory Visit: Payer: Self-pay | Admitting: *Deleted

## 2011-01-13 MED ORDER — SIMVASTATIN 80 MG PO TABS: 80.0000 mg | ORAL_TABLET | Freq: Every day | ORAL | Status: DC

## 2011-01-25 ENCOUNTER — Ambulatory Visit (INDEPENDENT_AMBULATORY_CARE_PROVIDER_SITE_OTHER): Payer: Medicare Other | Admitting: Pulmonary Disease

## 2011-01-25 ENCOUNTER — Encounter: Payer: Self-pay | Admitting: Pulmonary Disease

## 2011-01-25 DIAGNOSIS — J449 Chronic obstructive pulmonary disease, unspecified: Secondary | ICD-10-CM

## 2011-01-25 DIAGNOSIS — J984 Other disorders of lung: Secondary | ICD-10-CM

## 2011-01-25 NOTE — Patient Instructions (Addendum)
No change in meds. Will give you the flu shot today Stay active, you are doing great. Will schedule a followup ct scan of chest to followup your previous spots.  followup with me in 12mos, or sooner if you are having breathing issues.

## 2011-01-25 NOTE — Assessment & Plan Note (Signed)
The patient has not had a followup CT from her at 2010 scan which did show 2 mm nodules.  She is in a high risk group because of her long history of smoking, and therefore is overdue for a followup scan.  If no change, no further workup required.

## 2011-01-25 NOTE — Assessment & Plan Note (Signed)
The patient is doing very well from a COPD standpoint on her current medical regimen.  She has not had a recent acute exacerbation or pulmonary infection by her history.  She is staying very active, and is pleased with her exertional tolerance after completing pulmonary rehabilitation.  I have asked her to continue on her current meds, and to followup with me in a year.  We'll also give her the flu shot today.

## 2011-01-25 NOTE — Progress Notes (Signed)
  Subjective:    Patient ID: Sara Stafford, female    DOB: 07-Dec-1945, 65 y.o.   MRN: [id number]  HPI Patient comes in today for followup of her known significant COPD.  This is felt to be primarily secondary to emphysema, but she may have an asthmatic component.  She is doing extremely well with DULERA, and is maintaining an excellent quality of life.  She has participated in pulmonary rehabilitation, and is satisfied with her exertional tolerance.  She denies any significant cough or chest congestion.   Review of Systems  Constitutional: Negative for fever and unexpected weight change.  HENT: Negative for ear pain, nosebleeds, congestion, sore throat, rhinorrhea, sneezing, trouble swallowing, dental problem, postnasal drip and sinus pressure.   Eyes: Negative for redness and itching.  Respiratory: Negative for cough, chest tightness, shortness of breath and wheezing.   Cardiovascular: Negative for palpitations and leg swelling.  Gastrointestinal: Negative for nausea and vomiting.  Genitourinary: Negative for dysuria.  Musculoskeletal: Negative for joint swelling.  Skin: Negative for rash.  Neurological: Negative for headaches.  Hematological: Bruises/bleeds easily.  Psychiatric/Behavioral: Negative for dysphoric mood. The patient is not nervous/anxious.        Objective:   Physical Exam Well-developed female in no acute distress Nose without purulence or discharge noted Chest reveals mild decreased breath sounds, but no wheezes or rhonchi Cardiac exam with regular rate and rhythm Lower extremities without edema, no cyanosis noted  alert and oriented, moves all 4 extremities.       Assessment & Plan:

## 2011-01-28 ENCOUNTER — Ambulatory Visit (HOSPITAL_COMMUNITY)
Admission: RE | Admit: 2011-01-28 | Discharge: 2011-01-28 | Disposition: A | Discharge status: Home / Self Care | Payer: BC Managed Care – PPO | Source: Ambulatory Visit | Attending: Pulmonary Disease | Admitting: Pulmonary Disease

## 2011-01-28 DIAGNOSIS — J984 Other disorders of lung: Secondary | ICD-10-CM | POA: Insufficient documentation

## 2011-01-28 DIAGNOSIS — K8020 Calculus of gallbladder without cholecystitis without obstruction: Secondary | ICD-10-CM | POA: Insufficient documentation

## 2011-01-28 MED ORDER — IOHEXOL 350 MG/ML SOLN: 80.0000 mL | Freq: Once | INTRAVENOUS | Status: AC | PRN

## 2011-02-03 ENCOUNTER — Telehealth: Payer: Self-pay | Admitting: Pulmonary Disease

## 2011-02-03 NOTE — Telephone Encounter (Signed)
Called and spoke with pt.  Pt states she is calling for CT results that were done 8/31 at [facility].  Informed pt KC out of office today and will return tomorrow and I will check with him to see if the results are back yet or not.  Pt verbalized understanding and is ok to wait for results.

## 2011-02-04 ENCOUNTER — Other Ambulatory Visit: Payer: Self-pay | Admitting: Pulmonary Disease

## 2011-02-04 DIAGNOSIS — J984 Other disorders of lung: Secondary | ICD-10-CM

## 2011-02-10 NOTE — Telephone Encounter (Signed)
Per results note Dr. Shelle Iron spoke with pt about results. Carron Curie, CMA

## 2011-02-15 ENCOUNTER — Telehealth: Payer: Self-pay | Admitting: Pulmonary Disease

## 2011-02-15 NOTE — Telephone Encounter (Signed)
I spoke with pt and she states Sara Stafford advised her she needs a f/u ct scan done at [facility]. Pt states no one has called her with this apt. Order has been already placed. Please advise Almyra Free or Bjorn Loser thanks  Carver Fila, [place]

## 2011-02-18 NOTE — Telephone Encounter (Signed)
I spoke with Sara Stafford and she states the order is in their deferred box because they cannot schedule appt for jan 2013 yet, so they will call the pt in December to schedule. Pt states understanding. Carron Curie, CMA

## 2011-06-06 DIAGNOSIS — F411 Generalized anxiety disorder: Secondary | ICD-10-CM | POA: Diagnosis not present

## 2011-06-06 DIAGNOSIS — E782 Mixed hyperlipidemia: Secondary | ICD-10-CM | POA: Diagnosis not present

## 2011-06-06 DIAGNOSIS — E119 Type 2 diabetes mellitus without complications: Secondary | ICD-10-CM | POA: Diagnosis not present

## 2011-06-13 ENCOUNTER — Ambulatory Visit (INDEPENDENT_AMBULATORY_CARE_PROVIDER_SITE_OTHER): Payer: BC Managed Care – PPO | Admitting: Family Medicine

## 2011-06-13 ENCOUNTER — Encounter: Payer: Self-pay | Admitting: Family Medicine

## 2011-06-13 VITALS — BP 110/70 | HR 60 | Temp 98.0°F | Wt 152.0 lb

## 2011-06-13 DIAGNOSIS — E119 Type 2 diabetes mellitus without complications: Secondary | ICD-10-CM

## 2011-06-13 DIAGNOSIS — E782 Mixed hyperlipidemia: Secondary | ICD-10-CM

## 2011-06-13 DIAGNOSIS — J984 Other disorders of lung: Secondary | ICD-10-CM | POA: Diagnosis not present

## 2011-06-13 MED ORDER — GLIPIZIDE ER 10 MG PO TB24: ORAL_TABLET | ORAL | Status: DC

## 2011-06-13 MED ORDER — ALPRAZOLAM 0.5 MG PO TABS: ORAL_TABLET | ORAL | Status: DC

## 2011-06-13 MED ORDER — VENLAFAXINE HCL 37.5 MG PO TABS: ORAL_TABLET | ORAL | Status: DC

## 2011-06-13 NOTE — Progress Notes (Signed)
   Subjective:    Patient ID: Sara Stafford, female    DOB: 1946-05-15, 66 y.o.   MRN: [id number]  HPI Patient comes in today for followup.  Has been followed by another clinic for diabetes. Per pt, a1c was elevated last week (awaiting records). Wants to re establish with [place]. Metformin added to her glipizide.   Denies any episodes of hypoglycemia.  Admits to not following diabetic diet over the holidays.  COPD-  She is doing extremely well with DULERA, and is maintaining an excellent quality of life.  She has participated in pulmonary rehabilitation, and is satisfied with her exertional tolerance.  She denies any significant cough or chest congestion.  Followed by Dr. Shelle Iron.  Quit smoking!  Lung nodule- being followed by serial CT scans, next scan in a couple of months ([place]).  Review of Systems  See HPI No CP NO SOB    Objective:   Physical Exam BP 110/70  Pulse 60  Temp(Src) 98 F (36.7 C) (Oral)  Wt 152 lb (68.947 kg)  Well-developed female in no acute distress Nose without purulence or discharge noted Chest reveals mild decreased breath sounds, but no wheezes or rhonchi Cardiac exam with regular rate and rhythm Lower extremities without edema, no cyanosis noted  alert and oriented, moves all 4 extremities.     Assessment & Plan:   1. DIABETES-TYPE 2  Deteriorated per pt. Awaiting records. Follow up with me in 3 months.  2. COPD  Stable on current meds.

## 2011-06-29 ENCOUNTER — Telehealth: Payer: Self-pay | Admitting: Pulmonary Disease

## 2011-06-29 NOTE — Telephone Encounter (Signed)
The order was placed 02/04/11 for pt to schedule CT in 4 months. Pt requesting this to be done at [facility]. Please advise PCC, thanks

## 2011-06-30 NOTE — Telephone Encounter (Signed)
Unable to reach [place] schedulers will call back on fri 07/01/11

## 2011-07-01 NOTE — Telephone Encounter (Signed)
appt at [place] 07/06/11@8 :45am for chest ct pt is aware

## 2011-07-06 ENCOUNTER — Ambulatory Visit (HOSPITAL_COMMUNITY): Payer: BC Managed Care – PPO

## 2011-07-07 ENCOUNTER — Ambulatory Visit (HOSPITAL_COMMUNITY)
Admission: RE | Admit: 2011-07-07 | Discharge: 2011-07-07 | Disposition: A | Discharge status: Home / Self Care | Payer: BC Managed Care – PPO | Source: Ambulatory Visit | Attending: Pulmonary Disease | Admitting: Pulmonary Disease

## 2011-07-07 DIAGNOSIS — J984 Other disorders of lung: Secondary | ICD-10-CM | POA: Diagnosis not present

## 2011-07-07 DIAGNOSIS — Z09 Encounter for follow-up examination after completed treatment for conditions other than malignant neoplasm: Secondary | ICD-10-CM | POA: Insufficient documentation

## 2011-07-07 DIAGNOSIS — R918 Other nonspecific abnormal finding of lung field: Secondary | ICD-10-CM | POA: Diagnosis not present

## 2011-08-25 ENCOUNTER — Other Ambulatory Visit: Payer: Self-pay | Admitting: Family Medicine

## 2011-08-25 DIAGNOSIS — E119 Type 2 diabetes mellitus without complications: Secondary | ICD-10-CM

## 2011-08-25 DIAGNOSIS — E782 Mixed hyperlipidemia: Secondary | ICD-10-CM

## 2011-08-30 ENCOUNTER — Telehealth: Payer: Self-pay | Admitting: Family Medicine

## 2011-08-30 ENCOUNTER — Other Ambulatory Visit (INDEPENDENT_AMBULATORY_CARE_PROVIDER_SITE_OTHER): Payer: BC Managed Care – PPO

## 2011-08-30 DIAGNOSIS — E119 Type 2 diabetes mellitus without complications: Secondary | ICD-10-CM | POA: Diagnosis not present

## 2011-08-30 DIAGNOSIS — E782 Mixed hyperlipidemia: Secondary | ICD-10-CM

## 2011-08-30 LAB — LIPID PANEL
Cholesterol: 125 mg/dL (ref 0–200)
HDL: 39.4 mg/dL (ref >39.00)
LDL Cholesterol: 65 mg/dL (ref 0–99)
Triglycerides: 101 mg/dL (ref 0.0–149.0)

## 2011-08-30 LAB — COMPREHENSIVE METABOLIC PANEL
Albumin: 4.3 g/dL (ref 3.5–5.2)
Alkaline Phosphatase: 78 U/L (ref 39–117)
BUN: 13 mg/dL (ref 6–23)
Creatinine, Ser: 0.7 mg/dL (ref 0.4–1.2)
Glucose, Bld: 164 mg/dL — ABNORMAL HIGH (ref 70–99)
Potassium: 4.3 mEq/L (ref 3.5–5.1)

## 2011-08-30 NOTE — Telephone Encounter (Signed)
Patient came for lab appointment today and asked if it was possible to call [facility] pharmacy located in [place] for a prescription refill on Simvastatin (Tab) ZOCOR 80 MG Take 40 mg by mouth at bedtime.

## 2011-08-31 MED ORDER — SIMVASTATIN 80 MG PO TABS: 40.0000 mg | ORAL_TABLET | Freq: Every day | ORAL | Status: DC

## 2011-08-31 NOTE — Telephone Encounter (Signed)
Addended by: Eliezer Bottom on: 08/31/2011 09:57 AM   Modules accepted: Orders

## 2011-09-01 ENCOUNTER — Ambulatory Visit (INDEPENDENT_AMBULATORY_CARE_PROVIDER_SITE_OTHER): Payer: BC Managed Care – PPO | Admitting: Family Medicine

## 2011-09-01 ENCOUNTER — Encounter: Payer: Self-pay | Admitting: Family Medicine

## 2011-09-01 VITALS — BP 130/74 | HR 64 | Temp 97.9°F | Wt 149.0 lb

## 2011-09-01 DIAGNOSIS — E119 Type 2 diabetes mellitus without complications: Secondary | ICD-10-CM

## 2011-09-01 DIAGNOSIS — Z1231 Encounter for screening mammogram for malignant neoplasm of breast: Secondary | ICD-10-CM

## 2011-09-01 NOTE — Patient Instructions (Addendum)
We need to increase your Metformin to 1000 mg (two tablets) in the morning and 1000 mg at night. Continue Glucotrol at current dose. Cut back on sugars and carbohydrates. Let's recheck your a1c in 3 months. Please stop by to see Shirlee Limerick on your way out to set up your mammogram.

## 2011-09-01 NOTE — Progress Notes (Signed)
   Subjective:    Patient ID: Sara Stafford, female    DOB: Jan 10, 1946, 66 y.o.   MRN: [id number]  HPI Patient comes in to discuss DM.  Had been followed by another clinic for diabetes. Taking Metformin 1000 mg every morning every morning, 500 mg nightly and glipizide 10 mg daily.   Denies any episodes of hypoglycemia.   Lab Results  Component Value Date   HGBA1C 8.0* 08/30/2011   Checks CBGs a few times per week- 138- 150. No episodes of hypoglycemia.  Admits to eating sweets daily now. Needs to cut back, weight is stable but still higher than she would like.  Wt Readings from Last 3 Encounters:  09/01/11 149 lb (67.586 kg)  06/13/11 152 lb (68.947 kg)  01/25/11 151 lb 6.4 oz (68.675 kg)     Review of Systems  See HPI No CP NO SOB    Objective:   Physical Exam BP 130/74  Pulse 64  Temp(Src) 97.9 F (36.6 C) (Oral)  Wt 149 lb (67.586 kg)  Well-developed female in no acute distress Psych: alert and oriented, moves all 4 extremities.     Assessment & Plan:   1. DIABETES-TYPE 2  Hemoglobin A1c   Deteriorated. >15 min spent with face to face with patient, >50% counseling and/or coordinating care. Increase Metformin to 1000 mg twice daily, continue current dose of Glucotrol. Discussed Eat right diet. Recheck a1c in 3 months. The patient indicates understanding of these issues and agrees with the plan.

## 2011-09-19 ENCOUNTER — Encounter: Payer: Self-pay | Admitting: Family Medicine

## 2011-09-19 ENCOUNTER — Ambulatory Visit (INDEPENDENT_AMBULATORY_CARE_PROVIDER_SITE_OTHER): Payer: BC Managed Care – PPO | Admitting: Family Medicine

## 2011-09-19 VITALS — BP 150/80 | HR 84 | Temp 98.1°F | Wt 148.0 lb

## 2011-09-19 DIAGNOSIS — J019 Acute sinusitis, unspecified: Secondary | ICD-10-CM

## 2011-09-19 MED ORDER — ALPRAZOLAM 0.5 MG PO TABS: ORAL_TABLET | ORAL | Status: DC

## 2011-09-19 MED ORDER — MOMETASONE FURO-FORMOTEROL FUM 100-5 MCG/ACT IN AERO: 2.0000 | INHALATION_SPRAY | Freq: Two times a day (BID) | RESPIRATORY_TRACT | Status: DC

## 2011-09-19 MED ORDER — MONTELUKAST SODIUM 10 MG PO TABS: 10.0000 mg | ORAL_TABLET | Freq: Every day | ORAL | Status: DC

## 2011-09-19 MED ORDER — CETIRIZINE HCL 10 MG PO TABS: 10.0000 mg | ORAL_TABLET | Freq: Every day | ORAL | Status: DC

## 2011-09-19 MED ORDER — AMOXICILLIN-POT CLAVULANATE 875-125 MG PO TABS: 1.0000 | ORAL_TABLET | Freq: Two times a day (BID) | ORAL | Status: AC

## 2011-09-19 NOTE — Patient Instructions (Signed)
I think this is likely a virus.   Drink lots of fluids.   Continue your Zyrtec, singulair and inhaler as needed. Fill your antibiotic if you need to but I would give it time.

## 2011-09-19 NOTE — Progress Notes (Signed)
SUBJECTIVE:  Sara Stafford is a 66 y.o. female who complains of coryza, congestion, bilateral sinus pain and itching in eyes for 8 days. She denies a history of anorexia, chest pain and shortness of breath and has a history of asthma. Patient denies smoke cigarettes.  Using her inhaler twice daily for past several days. Also taking Singulair and Zyrtec daily.  Patient Active Problem List  Diagnoses  . DIABETES-TYPE 2  . HYPERLIPIDEMIA  . ANEMIAS DUE TO DISORDERS GLUTATHIONE METABOLISM  . TOBACCO ABUSE  . DEPRESSIVE DISORDER  . COPD  . LUNG NODULE  . GERD  . UTI  . FIBROMYALGIA  . FATIGUE  . MAMMOGRAM, ABNORMAL, LEFT   Past Medical History  Diagnosis Date  . COPD (chronic obstructive pulmonary disease)     no change with spiriva, poor tolerance of advair  . Routine general medical examination at a health care facility   . GERD (gastroesophageal reflux disease)   . Hyperlipidemia   . Fibromyalgia   . Fatigue   . Depressive disorder, not elsewhere classified   . Diabetes mellitus    Past Surgical History  Procedure Date  . Abdominal hysterectomy     partial   History  Substance Use Topics  . Smoking status: Former Smoker -- 1.0 packs/day for 50 years    Quit date: 06/06/2010  . Smokeless tobacco: Not on file  . Alcohol Use: Not on file   Family History  Problem Relation Age of Onset  . Stroke Father 34  . Diabetes Father   . Asthma Father   . Liver cancer Sister 60  . Pancreatic cancer Sister 55  . Brain cancer Brother   . Diabetes Sister   . Diabetes Sister   . Diabetes Sister   . Diabetes Sister   . Diabetes Sister   . Diabetes Sister   . Diabetes Sister   . Diabetes Paternal Grandmother    Allergies  Allergen Reactions  . Doxycycline     REACTION: thrush   Current Outpatient Prescriptions on File Prior to Visit  Medication Sig Dispense Refill  . albuterol (VENTOLIN HFA) 108 (90 BASE) MCG/ACT inhaler Inhale 2 puffs into the lungs every 4  (four) hours as needed.        Marland Kitchen aspirin 81 MG tablet Take 81 mg by mouth as needed.        Marland Kitchen glipiZIDE (GLUCOTROL XL) 10 MG 24 hr tablet 2 tabs by mouth daily  60 tablet  6  . metFORMIN (GLUCOPHAGE) 500 MG tablet Take 500 mg by mouth 2 (two) times daily with a meal.      . naproxen sodium (ALEVE) 220 MG tablet Take 220 mg by mouth. otc as directed       . omeprazole (PRILOSEC OTC) 20 MG tablet Take 20 mg by mouth daily as needed.        . simvastatin (ZOCOR) 80 MG tablet Take 0.5 tablets (40 mg total) by mouth at bedtime.  45 tablet  3  . venlafaxine (EFFEXOR) 37.5 MG tablet 1/2 tablet two times a day  30 tablet  6  . DISCONTD: cetirizine (ZYRTEC ALLERGY) 10 MG tablet Take 10 mg by mouth daily.        Marland Kitchen DISCONTD: Mometasone Furo-Formoterol Fum (DULERA) 100-5 MCG/ACT AERO Inhale 2 puffs into the lungs 2 (two) times daily.        Marland Kitchen DISCONTD: montelukast (SINGULAIR) 10 MG tablet Take 10 mg by mouth at bedtime.  OBJECTIVE: BP 150/80  Pulse 84  Temp(Src) 98.1 F (36.7 C) (Oral)  Wt 148 lb (67.132 kg)  She appears well, vital signs are as noted. Ears normal.  Throat and pharynx normal.  Neck supple. No adenopathy in the neck. Nose is congested. Sinuses non tender. The chest is clear, without wheezes or rales.  ASSESSMENT:  sinusitis  PLAN: Given duration and progression of symptoms, will treat for bacterial sinusitis. Symptomatic therapy suggested: push fluids, rest and return office visit prn if symptoms persist or worsen.Call or return to clinic prn if these symptoms worsen or fail to improve as anticipated.

## 2011-10-06 ENCOUNTER — Other Ambulatory Visit: Payer: Self-pay

## 2011-10-06 ENCOUNTER — Other Ambulatory Visit: Payer: Self-pay | Admitting: *Deleted

## 2011-10-06 ENCOUNTER — Telehealth: Payer: Self-pay

## 2011-10-06 MED ORDER — OMEPRAZOLE MAGNESIUM 20 MG PO TBEC: 20.0000 mg | DELAYED_RELEASE_TABLET | Freq: Every day | ORAL | Status: DC | PRN

## 2011-10-06 NOTE — Telephone Encounter (Signed)
Pt request refill omeprazole 20 mg #90 x  3 to [facility]. Pt notified while on phone refill done.

## 2011-10-06 NOTE — Telephone Encounter (Signed)
Pt request Dulera 100-5 mcg/act samples. We do not have samples at this time and advised pt could call back and we will ck again. Pt said going to beach next week and will call when returns. Pt said Dr Dayton Martes had told her not to use the [facility] 200 because it was too strong. Pt still has Dulera 200 sample and wants to know if can use that instead of buying Dulera 100-5 mcg at pharmacy co pay $75.00. Advised pt should not use Dulera 200 if told by Dr Dayton Martes too strong. Pt said she will get Dulera 100-5 at pharmacy and will call back about samples upon return from beach trip.

## 2011-11-04 ENCOUNTER — Telehealth: Payer: Self-pay | Admitting: Family Medicine

## 2011-11-04 ENCOUNTER — Other Ambulatory Visit (INDEPENDENT_AMBULATORY_CARE_PROVIDER_SITE_OTHER): Payer: BC Managed Care – PPO

## 2011-11-04 DIAGNOSIS — E119 Type 2 diabetes mellitus without complications: Secondary | ICD-10-CM

## 2011-11-04 LAB — HEMOGLOBIN A1C: Hgb A1c MFr Bld: 7 % — ABNORMAL HIGH (ref 4.6–6.5)

## 2011-11-04 NOTE — Telephone Encounter (Signed)
Patient is in the office now getting labs done and has requested a refill of xanax to be sent to [facility] in [place].

## 2011-11-04 NOTE — Telephone Encounter (Signed)
Refill request has been entered.

## 2011-11-04 NOTE — Telephone Encounter (Signed)
Please enter refill request

## 2011-11-07 ENCOUNTER — Ambulatory Visit: Payer: Self-pay | Admitting: Family Medicine

## 2011-11-07 ENCOUNTER — Other Ambulatory Visit: Payer: Self-pay | Admitting: Family Medicine

## 2011-11-07 ENCOUNTER — Telehealth: Payer: Self-pay | Admitting: Family Medicine

## 2011-11-07 NOTE — Telephone Encounter (Signed)
Patient calling in regards to Lab Results.

## 2011-11-07 NOTE — Telephone Encounter (Signed)
Advised pt of results.

## 2011-11-07 NOTE — Telephone Encounter (Signed)
Please see result note 

## 2011-11-07 NOTE — Telephone Encounter (Signed)
Medicine called to pharmacy. 

## 2011-11-08 ENCOUNTER — Encounter: Payer: Self-pay | Admitting: Family Medicine

## 2011-11-09 ENCOUNTER — Encounter: Payer: Self-pay | Admitting: *Deleted

## 2011-11-09 ENCOUNTER — Encounter: Payer: Self-pay | Admitting: Family Medicine

## 2011-11-09 NOTE — Telephone Encounter (Signed)
Yes, this has been taken care of.

## 2011-11-09 NOTE — Telephone Encounter (Signed)
Not sure why I keep getting this- I think I already signed refill request right? If not, ok to refill one month supply no refills.

## 2011-11-29 ENCOUNTER — Other Ambulatory Visit: Payer: Self-pay | Admitting: *Deleted

## 2011-11-29 DIAGNOSIS — M79609 Pain in unspecified limb: Secondary | ICD-10-CM | POA: Diagnosis not present

## 2011-11-29 DIAGNOSIS — G5760 Lesion of plantar nerve, unspecified lower limb: Secondary | ICD-10-CM | POA: Diagnosis not present

## 2011-11-29 MED ORDER — METFORMIN HCL 500 MG PO TABS: 500.0000 mg | ORAL_TABLET | Freq: Two times a day (BID) | ORAL | Status: DC

## 2011-11-30 ENCOUNTER — Other Ambulatory Visit: Payer: Self-pay

## 2011-11-30 MED ORDER — METFORMIN HCL 1000 MG PO TABS: 1000.0000 mg | ORAL_TABLET | Freq: Two times a day (BID) | ORAL | Status: DC

## 2011-11-30 NOTE — Telephone Encounter (Signed)
Left message advising pt that correct script has been sent to pharmacy and, unfortunatly, we do not have samples of dulera that she has requested.

## 2011-11-30 NOTE — Telephone Encounter (Signed)
Pt said metformin 500 mg called in and pt has been taking Metformin 1000 mg tabs one twice a day. 09/01/11 visit Dr Dayton Martes increased metformin 500 mg two tabs in AM and 2 tabs at night. Chrissie at [facility] said Dr Fran Lowes with [facility] called in Metformin 1000 mg tabs to take 1 tab bid on 09/01/11. Pt got refill on 10/02/11 and 10/31/11. No more refills on that rx. Pt said she has not seen Dr Fran Lowes in long time and does not know how refill sent in his name. Pt request new rx for Metformin 1000 mg taking 1 tab twice a day sent to [facility].Please advise. Pt also wants Dulera samples.Please advise.

## 2011-11-30 NOTE — Telephone Encounter (Signed)
Ok to refill as above. 

## 2011-12-08 ENCOUNTER — Other Ambulatory Visit: Payer: Self-pay | Admitting: Family Medicine

## 2011-12-08 NOTE — Telephone Encounter (Signed)
Medicine called to [facility] [place].

## 2012-01-04 ENCOUNTER — Telehealth: Payer: Self-pay | Admitting: Pulmonary Disease

## 2012-01-04 NOTE — Telephone Encounter (Signed)
Called, spoke with pt who states she is currently on dulera 100 but has a sample of dulera 200 that she received a long time ago from another dr.  Rock Nephew states this has the expiration date of 2012 on it and was wondering if ok to use this.  Advised she should not use this medication as it is expired.  She verbalized understanding of this and is aware of pending OV with Dr. Shelle Iron on Jan 25, 2012 and will call back if anything further is needed prior to this.

## 2012-01-04 NOTE — Telephone Encounter (Signed)
I spoke with pt and advised her we did not have any dulera samples at this time. She stated she did not need rx sent and will call [facility] for refill. Nothing further was needed

## 2012-01-04 NOTE — Telephone Encounter (Signed)
lmomtcb x1 

## 2012-01-19 DIAGNOSIS — J069 Acute upper respiratory infection, unspecified: Secondary | ICD-10-CM | POA: Diagnosis not present

## 2012-01-25 ENCOUNTER — Ambulatory Visit (INDEPENDENT_AMBULATORY_CARE_PROVIDER_SITE_OTHER): Payer: BC Managed Care – PPO | Admitting: Pulmonary Disease

## 2012-01-25 ENCOUNTER — Encounter: Payer: Self-pay | Admitting: Pulmonary Disease

## 2012-01-25 VITALS — BP 122/70 | HR 99 | Temp 98.1°F | Ht 65.0 in | Wt 144.8 lb

## 2012-01-25 DIAGNOSIS — J984 Other disorders of lung: Secondary | ICD-10-CM | POA: Diagnosis not present

## 2012-01-25 DIAGNOSIS — J449 Chronic obstructive pulmonary disease, unspecified: Secondary | ICD-10-CM

## 2012-01-25 NOTE — Progress Notes (Signed)
  Subjective:    Patient ID: Sara Stafford, female    DOB: [birth year], 66 y.o.   MRN: [id number]  HPI Patient comes in today for followup of her known COPD.  She has done very well on her inhaler regimen since the last visit, and has not had an acute exacerbation.  She rarely uses her rescue inhaler.  She has had a recent URI that started in her sinuses and and now is down into her chest.  She has been treating this conservatively, and feels that it is much improved.  She has rare discolored mucus, and has had no increased shortness of breath.   Review of Systems  Constitutional: Negative for fever and unexpected weight change.  HENT: Positive for congestion, rhinorrhea and sinus pressure. Negative for ear pain, nosebleeds, sore throat, sneezing, trouble swallowing, dental problem and postnasal drip.   Eyes: Negative for redness and itching.  Respiratory: Positive for cough. Negative for chest tightness, shortness of breath and wheezing.   Cardiovascular: Negative for palpitations and leg swelling.  Gastrointestinal: Negative for nausea and vomiting.  Genitourinary: Negative for dysuria.  Musculoskeletal: Negative for joint swelling.  Skin: Negative for rash.  Neurological: Negative for headaches.  Hematological: Does not bruise/bleed easily.  Psychiatric/Behavioral: Negative for dysphoric mood. The patient is not nervous/anxious.        Objective:   Physical Exam Well-developed female in no acute distress Nose without purulence or discharge noted Chest totally clear to auscultation Cardiac exam with regular rate and rhythm Lower extremities without edema, no cyanosis Alert and oriented, moves all 4 extremities.       Assessment & Plan:

## 2012-01-25 NOTE — Patient Instructions (Addendum)
Stay on dulera Stay active, work on exercise program. Can try mucinex one in am and pm for your chest congestion.  If this doesn't resolve by early next week, please call us If doing well, followup with me in one year.

## 2012-01-25 NOTE — Assessment & Plan Note (Signed)
The patient is doing fairly well on her current bronchodilator regimen.  She has had a recent URI, but feels that it is slowly getting better.  She has some residual chest congestion and mild discoloration of mucus, but again feels it is improving.  I have asked her to continue on her inhalers, and to keep working on some type of exercise program.

## 2012-02-21 ENCOUNTER — Ambulatory Visit (INDEPENDENT_AMBULATORY_CARE_PROVIDER_SITE_OTHER): Payer: BC Managed Care – PPO | Admitting: Family Medicine

## 2012-02-21 ENCOUNTER — Telehealth: Payer: Self-pay | Admitting: Family Medicine

## 2012-02-21 ENCOUNTER — Encounter: Payer: Self-pay | Admitting: Family Medicine

## 2012-02-21 VITALS — BP 120/64 | HR 82 | Temp 97.8°F | Ht 65.0 in | Wt 142.0 lb

## 2012-02-21 DIAGNOSIS — N390 Urinary tract infection, site not specified: Secondary | ICD-10-CM

## 2012-02-21 LAB — POCT URINALYSIS DIPSTICK

## 2012-02-21 LAB — POCT UA - MICROSCOPIC ONLY

## 2012-02-21 MED ORDER — SULFAMETHOXAZOLE-TRIMETHOPRIM 800-160 MG PO TABS: 1.0000 | ORAL_TABLET | Freq: Two times a day (BID) | ORAL | Status: DC

## 2012-02-21 NOTE — Patient Instructions (Addendum)
Push fluids, rest. Complete antibiotic course.  Return in  2 weeks for re-evaluation for hematuria resolution.. With me.

## 2012-02-21 NOTE — Addendum Note (Signed)
Addended by: Kerby Nora E on: 02/21/2012 04:36 PM   Modules accepted: Orders

## 2012-02-21 NOTE — Telephone Encounter (Signed)
Ok for lab visit only.

## 2012-02-21 NOTE — Telephone Encounter (Signed)
Pt scheduled today for UTI ov

## 2012-02-21 NOTE — Progress Notes (Signed)
  Subjective:    Patient ID: Sara Stafford, female    DOB: 05-10-1946, 66 y.o.   MRN: [id number]  Urinary Tract Infection  This is a new problem. The current episode started yesterday. The problem has been gradually worsening. The quality of the pain is described as burning. The patient is experiencing no pain. There has been no fever. She is not sexually active. There is no history of pyelonephritis. Associated symptoms include frequency, hematuria and urgency. Pertinent negatives include no chills, discharge, flank pain, nausea or vomiting. Treatments tried: azo. The treatment provided moderate relief. There is no history of catheterization, kidney stones, recurrent UTIs, a single kidney, urinary stasis or a urological procedure.      Review of Systems  Constitutional: Negative for chills.  Gastrointestinal: Negative for nausea and vomiting.  Genitourinary: Positive for urgency, frequency and hematuria. Negative for flank pain.       Objective:   Physical Exam  Constitutional: Vital signs are normal. She appears well-developed and well-nourished. She is cooperative.  Non-toxic appearance. She does not appear ill. No distress.  HENT:  Head: Normocephalic.  Right Ear: Hearing, tympanic membrane, external ear and ear canal normal. Tympanic membrane is not erythematous, not retracted and not bulging.  Left Ear: Hearing, tympanic membrane, external ear and ear canal normal. Tympanic membrane is not erythematous, not retracted and not bulging.  Nose: No mucosal edema or rhinorrhea. Right sinus exhibits no maxillary sinus tenderness and no frontal sinus tenderness. Left sinus exhibits no maxillary sinus tenderness and no frontal sinus tenderness.  Mouth/Throat: Uvula is midline, oropharynx is clear and moist and mucous membranes are normal.  Eyes: Conjunctivae normal, EOM and lids are normal. Pupils are equal, round, and reactive to light. No foreign bodies found.  Neck: Trachea normal and  normal range of motion. Neck supple. Carotid bruit is not present. No mass and no thyromegaly present.  Cardiovascular: Normal rate, regular rhythm, S1 normal, S2 normal, normal heart sounds, intact distal pulses and normal pulses.  Exam reveals no gallop and no friction rub.   No murmur heard. Pulmonary/Chest: Effort normal and breath sounds normal. Not tachypneic. No respiratory distress. She has no decreased breath sounds. She has no wheezes. She has no rhonchi. She has no rales.  Abdominal: Soft. Normal appearance and bowel sounds are normal. There is no tenderness. There is no rebound and no CVA tenderness.  Neurological: She is alert.  Skin: Skin is warm, dry and intact. No rash noted.  Psychiatric: Her speech is normal and behavior is normal. Judgment and thought content normal. Her mood appears not anxious. Cognition and memory are normal. She does not exhibit a depressed mood.          Assessment & Plan:

## 2012-02-21 NOTE — Telephone Encounter (Signed)
Pt hopes to come in for an A1C check. Last check was June, 2013. Does pt need OV or just lab visit??

## 2012-02-21 NOTE — Assessment & Plan Note (Signed)
Uncomplicated UTI.. Treat with 3 day course of bactrim. Call if symptoms not improving as expected.

## 2012-02-22 ENCOUNTER — Other Ambulatory Visit: Payer: Self-pay | Admitting: Family Medicine

## 2012-02-22 DIAGNOSIS — E119 Type 2 diabetes mellitus without complications: Secondary | ICD-10-CM

## 2012-02-22 DIAGNOSIS — E782 Mixed hyperlipidemia: Secondary | ICD-10-CM

## 2012-02-22 DIAGNOSIS — Z0000 Encounter for general adult medical examination without abnormal findings: Secondary | ICD-10-CM

## 2012-02-29 ENCOUNTER — Telehealth: Payer: Self-pay | Admitting: Family Medicine

## 2012-02-29 ENCOUNTER — Other Ambulatory Visit (INDEPENDENT_AMBULATORY_CARE_PROVIDER_SITE_OTHER): Payer: BC Managed Care – PPO

## 2012-02-29 ENCOUNTER — Ambulatory Visit (INDEPENDENT_AMBULATORY_CARE_PROVIDER_SITE_OTHER): Payer: BC Managed Care – PPO

## 2012-02-29 ENCOUNTER — Other Ambulatory Visit: Payer: Self-pay | Admitting: *Deleted

## 2012-02-29 ENCOUNTER — Telehealth (INDEPENDENT_AMBULATORY_CARE_PROVIDER_SITE_OTHER): Payer: BC Managed Care – PPO | Admitting: *Deleted

## 2012-02-29 DIAGNOSIS — Z0000 Encounter for general adult medical examination without abnormal findings: Secondary | ICD-10-CM | POA: Diagnosis not present

## 2012-02-29 DIAGNOSIS — E119 Type 2 diabetes mellitus without complications: Secondary | ICD-10-CM

## 2012-02-29 DIAGNOSIS — R300 Dysuria: Secondary | ICD-10-CM

## 2012-02-29 DIAGNOSIS — Z23 Encounter for immunization: Secondary | ICD-10-CM

## 2012-02-29 DIAGNOSIS — E782 Mixed hyperlipidemia: Secondary | ICD-10-CM | POA: Diagnosis not present

## 2012-02-29 LAB — CBC WITH DIFFERENTIAL/PLATELET
Basophils Absolute: 0 10*3/uL (ref 0.0–0.1)
Eosinophils Absolute: 0.1 10*3/uL (ref 0.0–0.7)
Hemoglobin: 12.9 g/dL (ref 12.0–15.0)
Lymphocytes Relative: 30 % (ref 12.0–46.0)
MCHC: 32.9 g/dL (ref 30.0–36.0)
Monocytes Absolute: 0.3 10*3/uL (ref 0.1–1.0)
Neutro Abs: 3.7 10*3/uL (ref 1.4–7.7)
Neutrophils Relative %: 63.4 % (ref 43.0–77.0)
RDW: 13.1 % (ref 11.5–14.6)

## 2012-02-29 LAB — COMPREHENSIVE METABOLIC PANEL
AST: 16 U/L (ref 0–37)
Alkaline Phosphatase: 62 U/L (ref 39–117)
BUN: 11 mg/dL (ref 6–23)
Creatinine, Ser: 0.6 mg/dL (ref 0.4–1.2)

## 2012-02-29 LAB — URINALYSIS, ROUTINE W REFLEX MICROSCOPIC
Ketones, ur: NEGATIVE
Leukocytes, UA: NEGATIVE
Specific Gravity, Urine: 1.025 (ref 1.000–1.030)
Urine Glucose: NEGATIVE
pH: 5.5 (ref 5.0–8.0)

## 2012-02-29 LAB — LIPID PANEL
HDL: 38.5 mg/dL — ABNORMAL LOW (ref >39.00)
LDL Cholesterol: 84 mg/dL (ref 0–99)
Total CHOL/HDL Ratio: 4
Triglycerides: 99 mg/dL (ref 0.0–149.0)

## 2012-02-29 MED ORDER — OMEPRAZOLE MAGNESIUM 20 MG PO TBEC: 20.0000 mg | DELAYED_RELEASE_TABLET | Freq: Every day | ORAL | Status: DC | PRN

## 2012-02-29 MED ORDER — CETIRIZINE HCL 10 MG PO TABS: 10.0000 mg | ORAL_TABLET | Freq: Every day | ORAL | Status: DC

## 2012-02-29 MED ORDER — SIMVASTATIN 80 MG PO TABS: 40.0000 mg | ORAL_TABLET | Freq: Every day | ORAL | Status: DC

## 2012-02-29 MED ORDER — VENLAFAXINE HCL 37.5 MG PO TABS: ORAL_TABLET | ORAL | Status: DC

## 2012-02-29 MED ORDER — FLUTICASONE PROPIONATE 50 MCG/ACT NA SUSP: 1.0000 | Freq: Every day | NASAL | Status: DC

## 2012-02-29 MED ORDER — MOMETASONE FURO-FORMOTEROL FUM 100-5 MCG/ACT IN AERO: 2.0000 | INHALATION_SPRAY | Freq: Two times a day (BID) | RESPIRATORY_TRACT | Status: DC

## 2012-02-29 NOTE — Telephone Encounter (Signed)
Urine sent for micro, per Dr. Dayton Martes.

## 2012-02-29 NOTE — Telephone Encounter (Signed)
Message copied by Eliezer Bottom on Wed Feb 29, 2012 11:10 AM ------      Message from: Baldomero Lamy      Created: Wed Feb 29, 2012  9:20 AM      Regarding: Urine specimen       Pt came in for labs, she says she was told to give a urine when she was here. She's giving a specimen now, and I'll just put it in by the microscope in the back room.            Thanks      Rodney Booze

## 2012-02-29 NOTE — Telephone Encounter (Signed)
[facility]ic[facility] today in [facility].  I cancelled those scripts, [facility].

## 2012-02-29 NOTE — Telephone Encounter (Signed)
The patient came to the check out counter in hopes of getting refills on Effexor, zocor, priolosec, dulera, glucophage, flonase, zyterc.  She is now using the [facility] in [place].

## 2012-03-01 ENCOUNTER — Other Ambulatory Visit: Payer: Self-pay | Admitting: *Deleted

## 2012-03-01 MED ORDER — ALPRAZOLAM 0.5 MG PO TABS: ORAL_TABLET | ORAL | Status: DC

## 2012-03-01 NOTE — Telephone Encounter (Signed)
Medicine called to [facility]. 

## 2012-04-03 ENCOUNTER — Ambulatory Visit (INDEPENDENT_AMBULATORY_CARE_PROVIDER_SITE_OTHER): Payer: BC Managed Care – PPO | Admitting: Family Medicine

## 2012-04-03 ENCOUNTER — Encounter: Payer: Self-pay | Admitting: Family Medicine

## 2012-04-03 VITALS — BP 138/70 | HR 84 | Temp 98.1°F | Wt 148.0 lb

## 2012-04-03 DIAGNOSIS — J069 Acute upper respiratory infection, unspecified: Secondary | ICD-10-CM | POA: Diagnosis not present

## 2012-04-03 MED ORDER — CEFUROXIME AXETIL 250 MG PO TABS: 250.0000 mg | ORAL_TABLET | Freq: Two times a day (BID) | ORAL | Status: AC

## 2012-04-03 NOTE — Patient Instructions (Addendum)
Take Ceftin as directed- 1 tablet twice daily x 7 days..  Drink lots of fluids. Continue Flonase and Albuterol as needed. Treat sympotmatically with Mucinex,  and Tylenol/Ibuprofen.  Call if not improving as expected in 5-7 days.

## 2012-04-03 NOTE — Progress Notes (Signed)
SUBJECTIVE:  Sara Stafford is a 66 y.o. female who complains of coryza, congestion, productive cough and myalgias for 10 days. She denies a history of anorexia and chest pain and admits to a history of asthma. Patient denies smoke cigarettes.  She felt symptoms were improving until last night night- acutely worse.  She did use her inhaler this morning although she does not think she has been wheezing.     Patient Active Problem List  Diagnosis  . DIABETES-TYPE 2  . HYPERLIPIDEMIA  . ANEMIAS DUE TO DISORDERS GLUTATHIONE METABOLISM  . TOBACCO ABUSE  . DEPRESSIVE DISORDER  . COPD  . LUNG NODULE  . GERD  . UTI  . FIBROMYALGIA  . FATIGUE  . MAMMOGRAM, ABNORMAL, LEFT   Past Medical History  Diagnosis Date  . COPD (chronic obstructive pulmonary disease)     no change with spiriva, poor tolerance of advair  . Routine general medical examination at a health care facility   . GERD (gastroesophageal reflux disease)   . Hyperlipidemia   . Fibromyalgia   . Fatigue   . Depressive disorder, not elsewhere classified   . Diabetes mellitus    Past Surgical History  Procedure Date  . Abdominal hysterectomy     partial   History  Substance Use Topics  . Smoking status: Former Smoker -- 1.0 packs/day for 50 years    Quit date: 06/06/2010  . Smokeless tobacco: Not on file  . Alcohol Use: Not on file   Family History  Problem Relation Age of Onset  . Stroke Father 39  . Diabetes Father   . Asthma Father   . Liver cancer Sister 34  . Pancreatic cancer Sister 24  . Brain cancer Brother   . Diabetes Sister   . Diabetes Sister   . Diabetes Sister   . Diabetes Sister   . Diabetes Sister   . Diabetes Sister   . Diabetes Sister   . Diabetes Paternal Grandmother    Allergies  Allergen Reactions  . Doxycycline     REACTION: thrush   Current Outpatient Prescriptions on File Prior to Visit  Medication Sig Dispense Refill  . albuterol (VENTOLIN HFA) 108 (90 BASE) MCG/ACT  inhaler Inhale 2 puffs into the lungs every 4 (four) hours as needed.        . ALPRAZolam (XANAX) 0.5 MG tablet Take 1/2 tablet by mouth twice daily  90 tablet  0  . aspirin 81 MG tablet Take 81 mg by mouth as needed.        . cetirizine (ZYRTEC ALLERGY) 10 MG tablet Take 1 tablet (10 mg total) by mouth daily.  90 tablet  1  . fluticasone (FLONASE) 50 MCG/ACT nasal spray Place 1 spray into the nose daily.  48 g  1  . glipiZIDE (GLUCOTROL XL) 10 MG 24 hr tablet 2 tabs by mouth daily  60 tablet  6  . metFORMIN (GLUCOPHAGE) 1000 MG tablet Take 1 tablet (1,000 mg total) by mouth 2 (two) times daily with a meal.  60 tablet  11  . mometasone-formoterol (DULERA) 100-5 MCG/ACT AERO Inhale 2 puffs into the lungs 2 (two) times daily.  3 Inhaler  1  . montelukast (SINGULAIR) 10 MG tablet Take 1 tablet (10 mg total) by mouth at bedtime.  90 tablet  3  . naproxen sodium (ALEVE) 220 MG tablet Take 220 mg by mouth. otc as directed       . omeprazole (PRILOSEC OTC) 20 MG tablet Take 1  tablet (20 mg total) by mouth daily as needed.  90 tablet  1  . simvastatin (ZOCOR) 80 MG tablet Take 0.5 tablets (40 mg total) by mouth at bedtime.  45 tablet  1  . sulfamethoxazole-trimethoprim (BACTRIM DS,SEPTRA DS) 800-160 MG per tablet Take 1 tablet by mouth 2 (two) times daily.  6 tablet  0  . venlafaxine (EFFEXOR) 37.5 MG tablet 1/2 tablet two times a day  90 tablet  1   The PMH, PSH, Social History, Family History, Medications, and allergies have been reviewed in [facility], and have been updated if relevant.  OBJECTIVE: BP 138/70  Pulse 84  Temp 98.1 F (36.7 C)  Wt 148 lb (67.132 kg)  She appears well, vital signs are as noted. Ears normal.  Throat and pharynx normal.  Neck supple. No adenopathy in the neck. Nose is congested. Sinuses non tender. The chest is clear, without wheezes or rales.  ASSESSMENT:  bronchitis  PLAN: Given duration and progression of symptoms in a pt with COPD, will treat with Ceftin.     Symptomatic therapy suggested: push fluids, rest and return office visit prn if symptoms persist or worsen.Call or return to clinic prn if these symptoms worsen or fail to improve as anticipated.

## 2012-04-09 ENCOUNTER — Other Ambulatory Visit: Payer: Self-pay | Admitting: Family Medicine

## 2012-05-28 ENCOUNTER — Other Ambulatory Visit: Payer: Self-pay | Admitting: Family Medicine

## 2012-05-28 NOTE — Telephone Encounter (Signed)
Medicine called to [facility]. 

## 2012-05-29 DIAGNOSIS — J069 Acute upper respiratory infection, unspecified: Secondary | ICD-10-CM | POA: Diagnosis not present

## 2012-05-29 DIAGNOSIS — R300 Dysuria: Secondary | ICD-10-CM | POA: Diagnosis not present

## 2012-05-29 DIAGNOSIS — M549 Dorsalgia, unspecified: Secondary | ICD-10-CM | POA: Diagnosis not present

## 2012-06-07 ENCOUNTER — Encounter: Payer: Self-pay | Admitting: Family Medicine

## 2012-06-07 ENCOUNTER — Ambulatory Visit (INDEPENDENT_AMBULATORY_CARE_PROVIDER_SITE_OTHER): Payer: BC Managed Care – PPO | Admitting: Family Medicine

## 2012-06-07 VITALS — BP 140/70 | HR 84 | Temp 97.9°F | Wt 145.0 lb

## 2012-06-07 DIAGNOSIS — J441 Chronic obstructive pulmonary disease with (acute) exacerbation: Secondary | ICD-10-CM

## 2012-06-07 NOTE — Progress Notes (Signed)
SUBJECTIVE:  Sara Stafford is a 67 y.o. female with h/o COPD here for urgent care follow up.   Went to UC on 05/31/2012 with 2 days of of coryza, congestion, productive cough and myalgias. Placed on zpack for COPD exacerbation per pt.  She denies a history of anorexia and chest pain and admits to a history of asthma. Patient denies smoke cigarettes- quit 4 years ago.  She feels a little better.  Still has cough- taking Tessalon.  No fevers, CP or SOB.     Patient Active Problem List  Diagnosis  . DIABETES-TYPE 2  . HYPERLIPIDEMIA  . ANEMIAS DUE TO DISORDERS GLUTATHIONE METABOLISM  . DEPRESSIVE DISORDER  . COPD  . LUNG NODULE  . GERD  . FIBROMYALGIA  . MAMMOGRAM, ABNORMAL, LEFT   Past Medical History  Diagnosis Date  . COPD (chronic obstructive pulmonary disease)     no change with spiriva, poor tolerance of advair  . Routine general medical examination at a health care facility   . GERD (gastroesophageal reflux disease)   . Hyperlipidemia   . Fibromyalgia   . Fatigue   . Depressive disorder, not elsewhere classified   . Diabetes mellitus    Past Surgical History  Procedure Date  . Abdominal hysterectomy     partial   History  Substance Use Topics  . Smoking status: Former Smoker -- 1.0 packs/day for 50 years    Quit date: 06/06/2010  . Smokeless tobacco: Not on file  . Alcohol Use: Not on file   Family History  Problem Relation Age of Onset  . Stroke Father 52  . Diabetes Father   . Asthma Father   . Liver cancer Sister 47  . Pancreatic cancer Sister 10  . Brain cancer Brother   . Diabetes Sister   . Diabetes Sister   . Diabetes Sister   . Diabetes Sister   . Diabetes Sister   . Diabetes Sister   . Diabetes Sister   . Diabetes Paternal Grandmother    Allergies  Allergen Reactions  . Doxycycline     REACTION: thrush   Current Outpatient Prescriptions on File Prior to Visit  Medication Sig Dispense Refill  . albuterol (VENTOLIN HFA) 108 (90  BASE) MCG/ACT inhaler Inhale 2 puffs into the lungs every 4 (four) hours as needed.        . ALPRAZolam (XANAX) 0.5 MG tablet TAKE ONE-HALF TABLET BY MOUTH TWICE DAILY  90 tablet  0  . aspirin 81 MG tablet Take 81 mg by mouth as needed.        . cetirizine (ZYRTEC ALLERGY) 10 MG tablet Take 1 tablet (10 mg total) by mouth daily.  90 tablet  1  . fluticasone (FLONASE) 50 MCG/ACT nasal spray Place 1 spray into the nose daily.  48 g  1  . glipiZIDE (GLUCOTROL XL) 10 MG 24 hr tablet TAKE TWO TABLETS BY MOUTH EVERY DAY  60 tablet  5  . metFORMIN (GLUCOPHAGE) 1000 MG tablet Take 1 tablet (1,000 mg total) by mouth 2 (two) times daily with a meal.  60 tablet  11  . mometasone-formoterol (DULERA) 100-5 MCG/ACT AERO Inhale 2 puffs into the lungs 2 (two) times daily.  3 Inhaler  1  . montelukast (SINGULAIR) 10 MG tablet Take 1 tablet (10 mg total) by mouth at bedtime.  90 tablet  3  . naproxen sodium (ALEVE) 220 MG tablet Take 220 mg by mouth. otc as directed       .  omeprazole (PRILOSEC OTC) 20 MG tablet Take 1 tablet (20 mg total) by mouth daily as needed.  90 tablet  1  . simvastatin (ZOCOR) 80 MG tablet Take 0.5 tablets (40 mg total) by mouth at bedtime.  45 tablet  1  . sulfamethoxazole-trimethoprim (BACTRIM DS,SEPTRA DS) 800-160 MG per tablet Take 1 tablet by mouth 2 (two) times daily.  6 tablet  0  . venlafaxine (EFFEXOR) 37.5 MG tablet 1/2 tablet two times a day  90 tablet  1   The PMH, PSH, Social History, Family History, Medications, and allergies have been reviewed in [facility], and have been updated if relevant.  OBJECTIVE: BP 140/70  Pulse 84  Temp 97.9 F (36.6 C)  Wt 145 lb (65.772 kg)  SpO2 99%  She appears well, vital signs are as noted. Ears normal.  Throat and pharynx normal.  Neck supple. No adenopathy in the neck. Nose is congested. Sinuses non tender. The chest is clear, without wheezes or rales.  ASSESSMENT:  bronchitis  PLAN: Likely viral and symptoms improving.  Lungs clear on  exam. Continue supportive care. Call or return to clinic prn if these symptoms worsen or fail to improve as anticipated.

## 2012-08-24 ENCOUNTER — Other Ambulatory Visit: Payer: Self-pay | Admitting: Family Medicine

## 2012-08-24 NOTE — Telephone Encounter (Signed)
Medicine called to [facility]. 

## 2012-09-13 DIAGNOSIS — H2510 Age-related nuclear cataract, unspecified eye: Secondary | ICD-10-CM | POA: Diagnosis not present

## 2012-09-13 DIAGNOSIS — H40009 Preglaucoma, unspecified, unspecified eye: Secondary | ICD-10-CM | POA: Diagnosis not present

## 2012-09-13 DIAGNOSIS — H524 Presbyopia: Secondary | ICD-10-CM | POA: Diagnosis not present

## 2012-09-13 DIAGNOSIS — E119 Type 2 diabetes mellitus without complications: Secondary | ICD-10-CM | POA: Diagnosis not present

## 2012-09-15 ENCOUNTER — Other Ambulatory Visit: Payer: Self-pay | Admitting: Family Medicine

## 2012-09-24 ENCOUNTER — Other Ambulatory Visit: Payer: Self-pay | Admitting: Family Medicine

## 2012-10-15 ENCOUNTER — Other Ambulatory Visit: Payer: Self-pay | Admitting: Family Medicine

## 2012-10-15 NOTE — Telephone Encounter (Signed)
Refills, x one each.  Pt has only been seen for acute issues in the past year.

## 2012-10-18 DIAGNOSIS — H40009 Preglaucoma, unspecified, unspecified eye: Secondary | ICD-10-CM | POA: Diagnosis not present

## 2012-10-23 ENCOUNTER — Encounter: Payer: Self-pay | Admitting: Radiology

## 2012-10-24 ENCOUNTER — Ambulatory Visit (INDEPENDENT_AMBULATORY_CARE_PROVIDER_SITE_OTHER): Payer: BC Managed Care – PPO | Admitting: Family Medicine

## 2012-10-24 ENCOUNTER — Encounter: Payer: Self-pay | Admitting: Family Medicine

## 2012-10-24 VITALS — BP 120/80 | HR 84 | Temp 97.9°F | Wt 146.0 lb

## 2012-10-24 DIAGNOSIS — F329 Major depressive disorder, single episode, unspecified: Secondary | ICD-10-CM

## 2012-10-24 DIAGNOSIS — E119 Type 2 diabetes mellitus without complications: Secondary | ICD-10-CM

## 2012-10-24 DIAGNOSIS — E782 Mixed hyperlipidemia: Secondary | ICD-10-CM | POA: Diagnosis not present

## 2012-10-24 DIAGNOSIS — Z136 Encounter for screening for cardiovascular disorders: Secondary | ICD-10-CM

## 2012-10-24 LAB — COMPREHENSIVE METABOLIC PANEL
ALT: 19 U/L (ref 0–35)
Albumin: 4.1 g/dL (ref 3.5–5.2)
CO2: 29 mEq/L (ref 19–32)
Calcium: 9.4 mg/dL (ref 8.4–10.5)
Chloride: 100 mEq/L (ref 96–112)
GFR: 85.38 mL/min (ref >60.00)
Glucose, Bld: 181 mg/dL — ABNORMAL HIGH (ref 70–99)
Potassium: 4 mEq/L (ref 3.5–5.1)
Sodium: 135 mEq/L (ref 135–145)
Total Protein: 6.9 g/dL (ref 6.0–8.3)

## 2012-10-24 LAB — LDL CHOLESTEROL, DIRECT: Direct LDL: 80.8 mg/dL

## 2012-10-24 LAB — HEMOGLOBIN A1C: Hgb A1c MFr Bld: 7.3 % — ABNORMAL HIGH (ref 4.6–6.5)

## 2012-10-24 NOTE — Patient Instructions (Addendum)
Good to see you. We will call you with your lab results.   

## 2012-10-24 NOTE — Progress Notes (Signed)
   Subjective:    Patient ID: Sara Stafford, female    DOB: Nov 06, 1945, 67 y.o.   MRN: [id number]  HPI Patient comes in today for followup.  On Metformin 1000 mg twice daily and glipizide.    Denies any episodes of hypoglycemia.  Admits to not checking CBGs on regular basis. Lab Results  Component Value Date   HGBA1C 7.0* 02/29/2012   Lab Results  Component Value Date   CHOL 142 02/29/2012   HDL 38.50* 02/29/2012   LDLCALC 84 02/29/2012   LDLDIRECT 169.7 12/11/2007   TRIG 99.0 02/29/2012   CHOLHDL 4 02/29/2012     COPD-  She is doing extremely well with DULERA, and is maintaining an excellent quality of life.  She has participated in pulmonary rehabilitation, and is satisfied with her exertional tolerance.  She denies any significant cough or chest congestion.  Followed by Dr. Shelle Iron.  Quit smoking!  Feels so much better since she quit smoking.  Taking Singulair and Zyrtec every morning.    Review of Systems  See HPI No CP NO SOB    Objective:   Physical Exam BP 120/80  Pulse 84  Temp(Src) 97.9 F (36.6 C)  Wt 146 lb (66.225 kg)  BMI 24.3 kg/m2 Wt Readings from Last 3 Encounters:  10/24/12 146 lb (66.225 kg)  06/07/12 145 lb (65.772 kg)  04/03/12 148 lb (67.132 kg)     Well-developed female in no acute distress Nose without purulence or discharge noted Chest reveals mild decreased breath sounds, but no wheezes or rhonchi Cardiac exam with regular rate and rhythm Lower extremities without edema, no cyanosis noted  alert and oriented, moves all 4 extremities.     Assessment & Plan:   1. DEPRESSIVE DISORDER Stable.    2. DIABETES-TYPE 2 Stable, will recheck labs today. - Comprehensive metabolic panel - Hemoglobin A1c  3. HLD On zocor.  Recheck labs today. - Lipid Panel

## 2012-11-19 ENCOUNTER — Other Ambulatory Visit: Payer: Self-pay | Admitting: Family Medicine

## 2012-11-19 MED ORDER — ALPRAZOLAM 0.5 MG PO TABS: ORAL_TABLET | ORAL | Status: DC

## 2012-11-19 NOTE — Telephone Encounter (Signed)
Last filled 10/22/12.

## 2012-11-20 NOTE — Telephone Encounter (Signed)
Xanax called to [facility] [place].

## 2012-11-20 NOTE — Telephone Encounter (Signed)
Pt left v/m cking on status of xanax refill. Left v/m for pt to ck with pharmacy.

## 2012-11-28 ENCOUNTER — Other Ambulatory Visit: Payer: Self-pay | Admitting: *Deleted

## 2012-11-28 MED ORDER — METFORMIN HCL 1000 MG PO TABS: 1000.0000 mg | ORAL_TABLET | Freq: Two times a day (BID) | ORAL | Status: DC

## 2012-12-10 ENCOUNTER — Ambulatory Visit: Payer: Self-pay | Admitting: Family Medicine

## 2012-12-10 DIAGNOSIS — Z1231 Encounter for screening mammogram for malignant neoplasm of breast: Secondary | ICD-10-CM | POA: Diagnosis not present

## 2012-12-12 ENCOUNTER — Encounter: Payer: Self-pay | Admitting: *Deleted

## 2012-12-12 ENCOUNTER — Telehealth: Payer: Self-pay | Admitting: Pulmonary Disease

## 2012-12-12 ENCOUNTER — Encounter: Payer: Self-pay | Admitting: Family Medicine

## 2012-12-12 LAB — HM MAMMOGRAPHY: HM Mammogram: NORMAL

## 2012-12-12 MED ORDER — MOMETASONE FURO-FORMOTEROL FUM 100-5 MCG/ACT IN AERO: 2.0000 | INHALATION_SPRAY | Freq: Two times a day (BID) | RESPIRATORY_TRACT | Status: DC

## 2012-12-12 NOTE — Telephone Encounter (Signed)
Refill sent. LMTCB on home #, cell # mailbox was full to see if pt wants coupon mailed to her or picked up. Carron Curie, CMA

## 2012-12-12 NOTE — Telephone Encounter (Signed)
I spoke with pt and she would like them mailed to her. Confirmed mailing address. This has been placed in mail to pt. Nothing further was needed

## 2012-12-13 ENCOUNTER — Other Ambulatory Visit: Payer: Self-pay | Admitting: *Deleted

## 2012-12-13 MED ORDER — VENLAFAXINE HCL 37.5 MG PO TABS: ORAL_TABLET | ORAL | Status: DC

## 2013-01-07 ENCOUNTER — Other Ambulatory Visit: Payer: Self-pay | Admitting: *Deleted

## 2013-01-07 MED ORDER — GLIPIZIDE ER 10 MG PO TB24: ORAL_TABLET | ORAL | Status: DC

## 2013-01-07 MED ORDER — OMEPRAZOLE 20 MG PO CPDR: DELAYED_RELEASE_CAPSULE | ORAL | Status: DC

## 2013-01-14 ENCOUNTER — Other Ambulatory Visit: Payer: Self-pay | Admitting: *Deleted

## 2013-01-14 MED ORDER — CETIRIZINE HCL 10 MG PO TABS: ORAL_TABLET | ORAL | Status: DC

## 2013-01-25 ENCOUNTER — Ambulatory Visit: Payer: BC Managed Care – PPO | Admitting: Pulmonary Disease

## 2013-02-11 ENCOUNTER — Other Ambulatory Visit: Payer: Self-pay | Admitting: *Deleted

## 2013-02-11 MED ORDER — ALPRAZOLAM 0.5 MG PO TABS: ORAL_TABLET | ORAL | Status: DC

## 2013-02-11 NOTE — Telephone Encounter (Signed)
Received faxed refill request from pharmacy. Last office visit 10/24/12. Is it okay to refill medication?

## 2013-02-11 NOTE — Telephone Encounter (Signed)
Refill called to [facility]. 

## 2013-02-18 ENCOUNTER — Ambulatory Visit: Payer: BC Managed Care – PPO | Admitting: Family Medicine

## 2013-02-25 DIAGNOSIS — N390 Urinary tract infection, site not specified: Secondary | ICD-10-CM | POA: Diagnosis not present

## 2013-02-25 DIAGNOSIS — R300 Dysuria: Secondary | ICD-10-CM | POA: Diagnosis not present

## 2013-02-25 DIAGNOSIS — R350 Frequency of micturition: Secondary | ICD-10-CM | POA: Diagnosis not present

## 2013-03-13 ENCOUNTER — Other Ambulatory Visit: Payer: Self-pay

## 2013-03-13 MED ORDER — VENLAFAXINE HCL 37.5 MG PO TABS: ORAL_TABLET | ORAL | Status: DC

## 2013-03-13 NOTE — Telephone Encounter (Signed)
Pt left v/m requesting refill effexor to [facility] [place]. Pt notified done.

## 2013-03-19 ENCOUNTER — Ambulatory Visit (INDEPENDENT_AMBULATORY_CARE_PROVIDER_SITE_OTHER): Payer: BC Managed Care – PPO

## 2013-03-19 DIAGNOSIS — Z23 Encounter for immunization: Secondary | ICD-10-CM | POA: Diagnosis not present

## 2013-03-22 ENCOUNTER — Other Ambulatory Visit: Payer: Self-pay | Admitting: *Deleted

## 2013-03-22 MED ORDER — SIMVASTATIN 80 MG PO TABS: 40.0000 mg | ORAL_TABLET | Freq: Every day | ORAL | Status: DC

## 2013-03-27 ENCOUNTER — Other Ambulatory Visit: Payer: Self-pay | Admitting: *Deleted

## 2013-03-27 MED ORDER — MONTELUKAST SODIUM 10 MG PO TABS: 10.0000 mg | ORAL_TABLET | Freq: Every day | ORAL | Status: DC

## 2013-04-19 ENCOUNTER — Other Ambulatory Visit: Payer: Self-pay | Admitting: Internal Medicine

## 2013-04-19 DIAGNOSIS — E119 Type 2 diabetes mellitus without complications: Secondary | ICD-10-CM

## 2013-04-19 DIAGNOSIS — I10 Essential (primary) hypertension: Secondary | ICD-10-CM

## 2013-04-19 DIAGNOSIS — E785 Hyperlipidemia, unspecified: Secondary | ICD-10-CM

## 2013-04-23 ENCOUNTER — Other Ambulatory Visit (INDEPENDENT_AMBULATORY_CARE_PROVIDER_SITE_OTHER): Payer: BC Managed Care – PPO

## 2013-04-23 DIAGNOSIS — I10 Essential (primary) hypertension: Secondary | ICD-10-CM

## 2013-04-23 DIAGNOSIS — E785 Hyperlipidemia, unspecified: Secondary | ICD-10-CM

## 2013-04-23 DIAGNOSIS — E119 Type 2 diabetes mellitus without complications: Secondary | ICD-10-CM | POA: Diagnosis not present

## 2013-04-23 LAB — COMPREHENSIVE METABOLIC PANEL
ALT: 18 U/L (ref 0–35)
CO2: 30 mEq/L (ref 19–32)
Calcium: 9.5 mg/dL (ref 8.4–10.5)
Chloride: 99 mEq/L (ref 96–112)
GFR: 92.64 mL/min (ref >60.00)
Glucose, Bld: 155 mg/dL — ABNORMAL HIGH (ref 70–99)
Potassium: 4.3 mEq/L (ref 3.5–5.1)
Sodium: 137 mEq/L (ref 135–145)
Total Protein: 7.4 g/dL (ref 6.0–8.3)

## 2013-04-23 LAB — CBC
HCT: 42.1 % (ref 36.0–46.0)
Hemoglobin: 14 g/dL (ref 12.0–15.0)
MCHC: 33.2 g/dL (ref 30.0–36.0)
MCV: 90.5 fl (ref 78.0–100.0)
Platelets: 307 10*3/uL (ref 150.0–400.0)
RBC: 4.65 Mil/uL (ref 3.87–5.11)
RDW: 13.7 % (ref 11.5–14.6)
WBC: 6.6 10*3/uL (ref 4.5–10.5)

## 2013-04-23 LAB — HEMOGLOBIN A1C: Hgb A1c MFr Bld: 7.6 % — ABNORMAL HIGH (ref 4.6–6.5)

## 2013-04-23 LAB — LIPID PANEL: Cholesterol: 174 mg/dL (ref 0–200)

## 2013-04-29 ENCOUNTER — Encounter: Payer: Self-pay | Admitting: *Deleted

## 2013-04-29 ENCOUNTER — Telehealth: Payer: Self-pay

## 2013-04-29 NOTE — Telephone Encounter (Signed)
Pt returning missed call for labs; pt notified as instructed per lab result note. Pt scheduled appt with Dr Dayton Martes 05/29/13.

## 2013-04-30 ENCOUNTER — Ambulatory Visit (INDEPENDENT_AMBULATORY_CARE_PROVIDER_SITE_OTHER): Payer: BC Managed Care – PPO | Admitting: Pulmonary Disease

## 2013-04-30 ENCOUNTER — Encounter: Payer: Self-pay | Admitting: Pulmonary Disease

## 2013-04-30 ENCOUNTER — Ambulatory Visit: Payer: BC Managed Care – PPO | Admitting: Pulmonary Disease

## 2013-04-30 VITALS — BP 130/84 | HR 108 | Temp 98.3°F | Ht 65.0 in | Wt 143.6 lb

## 2013-04-30 DIAGNOSIS — J449 Chronic obstructive pulmonary disease, unspecified: Secondary | ICD-10-CM | POA: Diagnosis not present

## 2013-04-30 NOTE — Assessment & Plan Note (Signed)
The patient appears to be doing fairly well from a pulmonary standpoint. She has not had a recent acute exacerbation, although she did have a URI in September of this year which was slow to resolve. She feels that her exertional tolerance is at baseline, and I have encouraged her to try and work on some type of conditioning program such as walking 30-45 minutes 3 times a week.

## 2013-04-30 NOTE — Patient Instructions (Signed)
No change in breathing medications Stay as active as possible.  folllowup with me in one year.

## 2013-04-30 NOTE — Progress Notes (Signed)
   Subjective:    Patient ID: Sara Stafford, female    DOB: Oct 26, 1945, 68 y.o.   MRN: [id number]  HPI The patient comes in today for followup of her known COPD. She has done well since the last visit on her current regimen, but did have a URI in September of this year. She feels that her exertional tolerance is at baseline, and is no longer producing purulent mucus. Although she is staying active, she is not participating in any regular conditioning program. She had some questions about the Prevnar vaccine today, and I have answered them.   Review of Systems  Constitutional: Negative for fever and unexpected weight change.  HENT: Positive for congestion and postnasal drip. Negative for dental problem, ear pain, nosebleeds, rhinorrhea, sinus pressure, sneezing, sore throat and trouble swallowing.   Eyes: Negative for redness and itching.  Respiratory: Positive for cough. Negative for chest tightness, shortness of breath and wheezing.   Cardiovascular: Negative for palpitations and leg swelling.  Gastrointestinal: Negative for nausea and vomiting.  Genitourinary: Negative for dysuria.  Musculoskeletal: Negative for joint swelling.  Skin: Negative for rash.  Neurological: Negative for headaches.  Hematological: Does not bruise/bleed easily.  Psychiatric/Behavioral: Negative for dysphoric mood. The patient is not nervous/anxious.        Objective:   Physical Exam Well-developed female in no acute distress Nose without purulence or discharge noted Neck without lymphadenopathy or thyromegaly Chest with mildly decreased breath sounds, no wheezing Cardiac exam with regular rate and rhythm Lower extremities without edema, no cyanosis Alert and oriented, moves all 4 extremities.       Assessment & Plan:

## 2013-05-13 ENCOUNTER — Other Ambulatory Visit: Payer: Self-pay | Admitting: *Deleted

## 2013-05-13 MED ORDER — ALPRAZOLAM 0.5 MG PO TABS: ORAL_TABLET | ORAL | Status: DC

## 2013-05-13 NOTE — Telephone Encounter (Signed)
Last filled 04/12/13 

## 2013-05-13 NOTE — Telephone Encounter (Signed)
Ok to phone in xanax 

## 2013-05-13 NOTE — Telephone Encounter (Signed)
rx called into pharmacy

## 2013-05-28 ENCOUNTER — Encounter: Payer: Self-pay | Admitting: Radiology

## 2013-05-29 ENCOUNTER — Ambulatory Visit: Payer: BC Managed Care – PPO | Admitting: Family Medicine

## 2013-06-03 ENCOUNTER — Encounter: Payer: Self-pay | Admitting: Radiology

## 2013-06-04 ENCOUNTER — Encounter: Payer: Self-pay | Admitting: Family Medicine

## 2013-06-04 ENCOUNTER — Ambulatory Visit (INDEPENDENT_AMBULATORY_CARE_PROVIDER_SITE_OTHER): Payer: BC Managed Care – PPO | Admitting: Family Medicine

## 2013-06-04 VITALS — BP 122/68 | HR 105 | Temp 98.0°F | Ht 64.5 in | Wt 145.0 lb

## 2013-06-04 DIAGNOSIS — E119 Type 2 diabetes mellitus without complications: Secondary | ICD-10-CM | POA: Diagnosis not present

## 2013-06-04 DIAGNOSIS — J069 Acute upper respiratory infection, unspecified: Secondary | ICD-10-CM

## 2013-06-04 NOTE — Patient Instructions (Signed)
Good to see you. Keep working on [employer]proving your diet. Follow up with me in 3 months.

## 2013-06-04 NOTE — Progress Notes (Signed)
Pre-visit discussion using our clinic review tool. No additional management support is needed unless otherwise documented below in the visit note.  

## 2013-06-04 NOTE — Progress Notes (Signed)
   Subjective:    Patient ID: Sara Stafford, female    DOB: 1945-12-13, 68 y.o.   MRN: [id number]  HPI Patient comes in today for followup.  On Metformin 1000 mg twice daily and glipizide 10 mg daily.  A1c continues to increase.  Denies any episodes of hypoglycemia.  Admits to not checking CBGs on regular basis.  Admits to not eating right.  Has been to a diabetic educator and says she knows what to do.  LOVES sweets.  She is compliant with her medications. Lab Results  Component Value Date   HGBA1C 7.6* 04/23/2013   Lab Results  Component Value Date   CHOL 174 04/23/2013   HDL 42.70 04/23/2013   LDLCALC 102* 04/23/2013   LDLDIRECT 80.8 10/24/2012   TRIG 145.0 04/23/2013   CHOLHDL 4 04/23/2013     Review of Systems  See HPI No n/v/d     Objective:   Physical Exam BP 122/68  Pulse 105  Temp(Src) 98 F (36.7 C) (Oral)  Ht 5' 4.5" (1.638 m)  Wt 145 lb (65.772 kg)  BMI 24.51 kg/m2  SpO2 97% Wt Readings from Last 3 Encounters:  06/04/13 145 lb (65.772 kg)  04/30/13 143 lb 9.6 oz (65.137 kg)  10/24/12 146 lb (66.225 kg)     Well-developed female in no acute distress Nose without purulence or discharge noted Chest reveals mild decreased breath sounds, but no wheezes or rhonchi Cardiac exam with regular rate and rhythm Lower extremities without edema, no cyanosis noted  alert and oriented, moves all 4 extremities.    Diabetic foot exam: Normal inspection No skin breakdown No calluses  Normal DP pulses Normal sensation to light touch and monofilament Nails normal  Assessment & Plan:

## 2013-06-04 NOTE — Assessment & Plan Note (Signed)
Deteriorated. Discussed tx options.  She would like to really work on diet. Continue current meds. Copy of eat right diet given to pt. Follow up in 3 months. The patient indicates understanding of these issues and agrees with the plan.

## 2013-06-05 ENCOUNTER — Telehealth: Payer: Self-pay

## 2013-06-05 NOTE — Telephone Encounter (Signed)
Relevant patient education mailed to patient.  

## 2013-06-10 ENCOUNTER — Other Ambulatory Visit: Payer: Self-pay

## 2013-06-10 MED ORDER — VENLAFAXINE HCL 37.5 MG PO TABS: ORAL_TABLET | ORAL | Status: DC

## 2013-06-10 NOTE — Telephone Encounter (Signed)
Pt request refill effexor to [facility] [place], advised pt done.

## 2013-07-01 ENCOUNTER — Other Ambulatory Visit: Payer: Self-pay | Admitting: *Deleted

## 2013-07-01 ENCOUNTER — Other Ambulatory Visit: Payer: Self-pay | Admitting: Family Medicine

## 2013-07-01 MED ORDER — GLIPIZIDE ER 10 MG PO TB24: ORAL_TABLET | ORAL | Status: DC

## 2013-07-01 MED ORDER — MONTELUKAST SODIUM 10 MG PO TABS: 10.0000 mg | ORAL_TABLET | Freq: Every day | ORAL | Status: DC

## 2013-07-01 MED ORDER — METFORMIN HCL 1000 MG PO TABS: 1000.0000 mg | ORAL_TABLET | Freq: Two times a day (BID) | ORAL | Status: DC

## 2013-07-01 MED ORDER — OMEPRAZOLE 20 MG PO CPDR: DELAYED_RELEASE_CAPSULE | ORAL | Status: DC

## 2013-07-30 ENCOUNTER — Telehealth: Payer: Self-pay | Admitting: Family Medicine

## 2013-07-30 DIAGNOSIS — L988 Other specified disorders of the skin and subcutaneous tissue: Secondary | ICD-10-CM | POA: Diagnosis not present

## 2013-07-30 DIAGNOSIS — B029 Zoster without complications: Secondary | ICD-10-CM | POA: Diagnosis not present

## 2013-07-30 DIAGNOSIS — R52 Pain, unspecified: Secondary | ICD-10-CM | POA: Diagnosis not present

## 2013-07-30 NOTE — Telephone Encounter (Signed)
Attempted to call pt at about  1220, 07/30/13  and message left.  TY!

## 2013-08-05 ENCOUNTER — Other Ambulatory Visit: Payer: Self-pay | Admitting: *Deleted

## 2013-08-05 MED ORDER — ALPRAZOLAM 0.5 MG PO TABS: ORAL_TABLET | ORAL | Status: DC

## 2013-08-05 NOTE — Telephone Encounter (Signed)
Spoke to pt and informed her Rx has been called in to requested pharmacy 

## 2013-08-05 NOTE — Telephone Encounter (Signed)
Received faxed refill request from pharmacy. Last refill 05/13/13 #90, last office visit 10/24/12. Is it okay to refill medication?

## 2013-09-02 ENCOUNTER — Encounter: Payer: Self-pay | Admitting: Radiology

## 2013-09-03 ENCOUNTER — Encounter: Payer: Self-pay | Admitting: Family Medicine

## 2013-09-03 ENCOUNTER — Ambulatory Visit (INDEPENDENT_AMBULATORY_CARE_PROVIDER_SITE_OTHER): Payer: BC Managed Care – PPO | Admitting: Family Medicine

## 2013-09-03 VITALS — BP 124/62 | HR 78 | Temp 97.8°F | Wt 146.5 lb

## 2013-09-03 DIAGNOSIS — F329 Major depressive disorder, single episode, unspecified: Secondary | ICD-10-CM | POA: Diagnosis not present

## 2013-09-03 DIAGNOSIS — E119 Type 2 diabetes mellitus without complications: Secondary | ICD-10-CM

## 2013-09-03 DIAGNOSIS — F3289 Other specified depressive episodes: Secondary | ICD-10-CM

## 2013-09-03 LAB — HEMOGLOBIN A1C: Hgb A1c MFr Bld: 7.3 % — ABNORMAL HIGH (ref 4.6–6.5)

## 2013-09-03 LAB — BASIC METABOLIC PANEL
BUN: 13 mg/dL (ref 6–23)
CALCIUM: 9.8 mg/dL (ref 8.4–10.5)
CO2: 31 mEq/L (ref 19–32)
CREATININE: 0.6 mg/dL (ref 0.4–1.2)
Chloride: 99 mEq/L (ref 96–112)
GFR: 97.56 mL/min (ref >60.00)
Glucose, Bld: 194 mg/dL — ABNORMAL HIGH (ref 70–99)
Potassium: 4.2 mEq/L (ref 3.5–5.1)
Sodium: 138 mEq/L (ref 135–145)

## 2013-09-03 LAB — MICROALBUMIN / CREATININE URINE RATIO
Creatinine,U: 9.2 mg/dL
MICROALB UR: 0.1 mg/dL (ref 0.0–1.9)
MICROALB/CREAT RATIO: 1.1 mg/g (ref 0.0–30.0)

## 2013-09-03 MED ORDER — VENLAFAXINE HCL 37.5 MG PO TABS: ORAL_TABLET | ORAL | Status: DC

## 2013-09-03 MED ORDER — ALPRAZOLAM 0.5 MG PO TABS: ORAL_TABLET | ORAL | Status: DC

## 2013-09-03 NOTE — Assessment & Plan Note (Signed)
Stable on current rx. No changes. 

## 2013-09-03 NOTE — Patient Instructions (Signed)
Good to see you. Please schedule an appointment with Dr. Copland on your way out. 

## 2013-09-03 NOTE — Progress Notes (Signed)
Pre visit review using our clinic review tool, if applicable. No additional management support is needed unless otherwise documented below in the visit note. 

## 2013-09-03 NOTE — Progress Notes (Signed)
Subjective:    Patient ID: Sara BastosCatherine B Manuele, female    DOB: 03/14/1946, 68 y.o.   MRN: [id number]  HPI 7[address] pleasant female comes in today for followup.  On Metformin 1000 mg twice daily and glipizide 10 mg daily.    Denies any episodes of hypoglycemia.  Admits to not checking CBGs on regular basis.  Admits to not eating right.  Has been to a diabetic educator and says she knows what to do..  She is compliant with her medications. Lab Results  Component Value Date   HGBA1C 7.6* 04/23/2013   Depression- on Effexor and xanax.  Has been under a lot of stress with her husband's health.  Denies any worsening anxiety or depression.   Patient Active Problem List   Diagnosis Date Noted  . MAMMOGRAM, ABNORMAL, LEFT 09/10/2009  . LUNG NODULE 03/20/2009  . ANEMIAS DUE TO DISORDERS GLUTATHIONE METABOLISM 07/31/2008  . COPD 10/11/2007  . DIABETES-TYPE 2 06/04/2007  . HYPERLIPIDEMIA 06/04/2007  . DEPRESSIVE DISORDER 06/04/2007  . GERD 06/04/2007  . FIBROMYALGIA 06/04/2007   Past Medical History  Diagnosis Date  . COPD (chronic obstructive pulmonary disease)     no change with spiriva, poor tolerance of advair  . Routine general medical examination at a health care facility   . GERD (gastroesophageal reflux disease)   . Hyperlipidemia   . Fibromyalgia   . Fatigue   . Depressive disorder, not elsewhere classified   . Diabetes mellitus    Past Surgical History  Procedure Laterality Date  . Abdominal hysterectomy      partial   History  Substance Use Topics  . Smoking status: Former Smoker -- 1.00 packs/day for 50 years    Quit date: 06/06/2010  . Smokeless tobacco: Not on file  . Alcohol Use: Not on file   Family History  Problem Relation Age of Onset  . Stroke Father 4480  . Diabetes Father   . Asthma Father   . Liver cancer Sister 6081  . Pancreatic cancer Sister 5472  . Brain cancer Brother   . Diabetes Sister   . Diabetes Sister   . Diabetes Sister   . Diabetes Sister    . Diabetes Sister   . Diabetes Sister   . Diabetes Sister   . Diabetes Paternal Grandmother    Allergies  Allergen Reactions  . Doxycycline     REACTION: thrush   Current Outpatient Prescriptions on File Prior to Visit  Medication Sig Dispense Refill  . albuterol (VENTOLIN HFA) 108 (90 BASE) MCG/ACT inhaler Inhale 2 puffs into the lungs every 4 (four) hours as needed.        . ALPRAZolam (XANAX) 0.5 MG tablet TAKE ONE-HALF TABLET BY MOUTH TWICE DAILY  90 tablet  0  . aspirin 81 MG tablet Take 81 mg by mouth as needed.        . cetirizine (ZYRTEC) 10 MG tablet TAKE ONE TABLET BY MOUTH ONCE DAILY  90 tablet  0  . fluticasone (FLONASE) 50 MCG/ACT nasal spray Place 1 spray into the nose daily.  48 g  1  . glipiZIDE (GLUCOTROL XL) 10 MG 24 hr tablet TAKE TWO TABLETS BY MOUTH EVERY DAY  60 tablet  5  . metFORMIN (GLUCOPHAGE) 1000 MG tablet Take 1 tablet (1,000 mg total) by mouth 2 (two) times daily with a meal.  60 tablet  6  . mometasone-formoterol (DULERA) 100-5 MCG/ACT AERO Inhale 2 puffs into the lungs 2 (two) times daily.  3 Inhaler  1  . montelukast (SINGULAIR) 10 MG tablet Take 1 tablet (10 mg total) by mouth at bedtime.  90 tablet  0  . naproxen sodium (ALEVE) 220 MG tablet Take 220 mg by mouth. otc as directed       . omeprazole (PRILOSEC) 20 MG capsule TAKE ONE CAPSULE BY MOUTH EVERY DAY AS NEEDED  30 capsule  5  . simvastatin (ZOCOR) 80 MG tablet Take 0.5 tablets (40 mg total) by mouth at bedtime.  45 tablet  1  . venlafaxine (EFFEXOR) 37.5 MG tablet TAKE ONE-HALF TABLET BY MOUTH TWICE DAILY  90 tablet  0  . [DISCONTINUED] omeprazole (PRILOSEC OTC) 20 MG tablet Take 1 tablet (20 mg total) by mouth daily as needed.  90 tablet  1   No current facility-administered medications on file prior to visit.   The PMH, PSH, Social History, Family History, Medications, and allergies have been reviewed in [facility], and have been updated if relevant.  Review of Systems  See HPI No n/v/d      Objective:   Physical Exam BP 124/62  Pulse 78  Temp(Src) 97.8 F (36.6 C) (Oral)  Wt 146 lb 8 oz (66.452 kg)  SpO2 98% Wt Readings from Last 3 Encounters:  09/03/13 146 lb 8 oz (66.452 kg)  06/04/13 145 lb (65.772 kg)  04/30/13 143 lb 9.6 oz (65.137 kg)     Well-developed female in no acute distress Nose without purulence or discharge noted Chest reveals mild decreased breath sounds, but no wheezes or rhonchi Cardiac exam with regular rate and rhythm Lower extremities without edema, no cyanosis noted  alert and oriented, moves all 4 extremities.   Assessment & Plan:

## 2013-09-03 NOTE — Assessment & Plan Note (Signed)
Continue current rx. Labs and urine micro today. The patient indicates understanding of these issues and agrees with the plan.

## 2013-09-11 ENCOUNTER — Ambulatory Visit (INDEPENDENT_AMBULATORY_CARE_PROVIDER_SITE_OTHER): Payer: BC Managed Care – PPO | Admitting: Family Medicine

## 2013-09-11 ENCOUNTER — Encounter: Payer: Self-pay | Admitting: Family Medicine

## 2013-09-11 ENCOUNTER — Institutional Professional Consult (permissible substitution): Payer: BC Managed Care – PPO | Admitting: Family Medicine

## 2013-09-11 VITALS — BP 130/78 | HR 83 | Temp 98.1°F | Ht 65.75 in | Wt 144.8 lb

## 2013-09-11 DIAGNOSIS — IMO0002 Reserved for concepts with insufficient information to code with codable children: Secondary | ICD-10-CM

## 2013-09-11 DIAGNOSIS — M719 Bursopathy, unspecified: Secondary | ICD-10-CM

## 2013-09-11 DIAGNOSIS — M7581 Other shoulder lesions, right shoulder: Secondary | ICD-10-CM

## 2013-09-11 DIAGNOSIS — M75100 Unspecified rotator cuff tear or rupture of unspecified shoulder, not specified as traumatic: Secondary | ICD-10-CM | POA: Diagnosis not present

## 2013-09-11 DIAGNOSIS — M67919 Unspecified disorder of synovium and tendon, unspecified shoulder: Secondary | ICD-10-CM

## 2013-09-11 DIAGNOSIS — M7550 Bursitis of unspecified shoulder: Secondary | ICD-10-CM

## 2013-09-11 NOTE — Progress Notes (Signed)
Pre visit review using our clinic review tool, if applicable. No additional management support is needed unless otherwise documented below in the visit note. 

## 2013-09-11 NOTE — Progress Notes (Signed)
Date:  09/11/2013   Name:  Sara Stafford   DOB:  [birth year]   MRN:  [id number]  PCP:  Ruthe Mannanalia Aron, MD   This 68 y.o. female patient noted above presents with shoulder pain that has been ongoing for 3 weeks.  The patient denies neck pain or radicular symptoms. Denies dislocation, subluxation, separation of the shoulder. The patient does complain of pain in the overhead plane with significant painful arc of motion. Feeling week, hurts all the way up to neck. Bothering her for about  Or 3 weeks. Getting leaves out of bushes and the yard.   Medications Tried: Tylenol, NSAIDS Ice or Heat: minimally helpful Tried PT: No  Prior shoulder Injury: No Prior surgery: No Prior fracture: No  The PMH, PSH, Social History, Family History, Medications, and allergies have been reviewed [facility], and have been updated if relevant.  REVIEW OF SYSTEMS  GEN: No fevers, chills. Nontoxic. Primarily MSK c/o today. MSK: Detailed in the HPI GI: tolerating PO intake without difficulty Neuro: No numbness, parasthesias, or tingling associated. Otherwise the pertinent positives of the ROS are noted above.   PHYSICAL EXAM  Blood pressure 130/78, pulse 83, temperature 98.1 F (36.7 C), temperature source Oral, height 5' 5.75" (1.67 m), weight 144 lb 12 oz (65.658 kg), SpO2 97.00%.  GEN: Well-developed,well-nourished,in no acute distress; alert,appropriate and cooperative throughout examination HEENT: Normocephalic and atraumatic without obvious abnormalities. Ears, externally no deformities PULM: Breathing comfortably in no respiratory distress EXT: No clubbing, cyanosis, or edema PSYCH: Normally interactive. Cooperative during the interview. Pleasant. Friendly and conversant. Not anxious or depressed appearing. Normal, full affect.  Shoulder: R Inspection: No muscle wasting or winging Ecchymosis/edema: neg  AC joint, scapula, clavicle: NT Cervical spine: NT, full ROM Spurling's: neg Abduction:  full, 5/5 Flexion: full, 5/5 IR, full, lift-off: 5/5 ER at neutral: full, 5/5 AC crossover: neg Neer: pos Hawkins: pos Drop Test: neg Empty Can: pos Supraspinatus insertion: mild-mod T Bicipital groove: NT Speed's: neg Yergason's: neg Sulcus sign: neg Scapular dyskinesis: none C5-T1 intact  Neuro: Sensation intact Grip 5/5   Assessment and Plan:  Right rotator cuff tendinitis  Subacromial bursitis   Likely from recent yard work Basic rom and [employer] reviewed  The patient could benefit from formal PT to assist with scapular stabilization and RTC strengthening.  SubAC Injection, R Verbal consent was obtained from the patient. Risks (including rare infection), benefits, and alternatives were explained. Patient prepped with Chloraprep and Ethyl Chloride used for anesthesia. The subacromial space was injected using the posterior approach. The patient tolerated the procedure well and had decreased pain post injection. No complications. Injection: 8 cc of Lidocaine 1% and 2 cc of Depo-Medrol 40 mg. Needle: 22 gauge   Signed,  Yekaterina Escutia T. Maury Groninger, MD, CAQ Sports Medicine  [facility] [address] [place] [address] Phone: [phone] Fax: [phone]   Patient's Medications  New Prescriptions   No medications on file  Previous Medications   ALBUTEROL (VENTOLIN HFA) 108 (90 BASE) MCG/ACT INHALER    Inhale 2 puffs into the lungs every 4 (four) hours as needed.     ALPRAZOLAM (XANAX) 0.5 MG TABLET    TAKE ONE-HALF TABLET BY MOUTH TWICE DAILY   ASPIRIN 81 MG TABLET    Take 81 mg by mouth as needed.     CETIRIZINE (ZYRTEC) 10 MG TABLET    TAKE ONE TABLET BY MOUTH ONCE DAILY   FLUTICASONE (FLONASE) 50 MCG/ACT NASAL SPRAY  Place 1 spray into the nose daily.   GLIPIZIDE (GLUCOTROL XL) 10 MG 24 HR TABLET    TAKE TWO TABLETS BY MOUTH EVERY DAY   METFORMIN (GLUCOPHAGE) 1000 MG TABLET    Take 1 tablet (1,000 mg total) by mouth 2 (two) times daily with a  meal.   MOMETASONE-FORMOTEROL (DULERA) 100-5 MCG/ACT AERO    Inhale 2 puffs into the lungs 2 (two) times daily.   MONTELUKAST (SINGULAIR) 10 MG TABLET    Take 1 tablet (10 mg total) by mouth at bedtime.   NAPROXEN SODIUM (ALEVE) 220 MG TABLET    Take 220 mg by mouth. otc as directed    OMEPRAZOLE (PRILOSEC) 20 MG CAPSULE    TAKE ONE CAPSULE BY MOUTH EVERY DAY AS NEEDED   SIMVASTATIN (ZOCOR) 80 MG TABLET    Take 0.5 tablets (40 mg total) by mouth at bedtime.   VENLAFAXINE (EFFEXOR) 37.5 MG TABLET    TAKE ONE-HALF TABLET BY MOUTH TWICE DAILY  Modified Medications   No medications on file  Discontinued Medications   No medications on file

## 2013-09-20 DIAGNOSIS — J069 Acute upper respiratory infection, unspecified: Secondary | ICD-10-CM | POA: Diagnosis not present

## 2013-10-07 ENCOUNTER — Telehealth: Payer: Self-pay | Admitting: Family Medicine

## 2013-10-07 MED ORDER — CETIRIZINE HCL 10 MG PO TABS: ORAL_TABLET | ORAL | Status: DC

## 2013-10-07 MED ORDER — MONTELUKAST SODIUM 10 MG PO TABS: 10.0000 mg | ORAL_TABLET | Freq: Every day | ORAL | Status: DC

## 2013-10-07 NOTE — Telephone Encounter (Signed)
Pt called, pharmacy states that they faxed medication refill requests for both cetirizine (ZYRTEC) 10 MG tablet [[id number]][[id number]] and montelukast (SINGULAIR) 10 MG tablet [[id number].  Pt would like a call to [phone] when refill is authorized.  [facility]acy of choice is [facility] [place].  / lt

## 2013-10-07 NOTE — Telephone Encounter (Signed)
Spoke to pt and informed her Rx has been sent to requested pharmacy 

## 2013-10-14 ENCOUNTER — Other Ambulatory Visit: Payer: Self-pay | Admitting: *Deleted

## 2013-10-14 MED ORDER — CETIRIZINE HCL 10 MG PO TABS: ORAL_TABLET | ORAL | Status: DC

## 2013-10-14 MED ORDER — SIMVASTATIN 80 MG PO TABS: 40.0000 mg | ORAL_TABLET | Freq: Every day | ORAL | Status: DC

## 2013-10-24 DIAGNOSIS — E119 Type 2 diabetes mellitus without complications: Secondary | ICD-10-CM | POA: Diagnosis not present

## 2013-10-24 DIAGNOSIS — H524 Presbyopia: Secondary | ICD-10-CM | POA: Diagnosis not present

## 2013-10-24 DIAGNOSIS — H353190 Nonexudative age-related macular degeneration, unspecified eye, stage unspecified: Secondary | ICD-10-CM | POA: Diagnosis not present

## 2013-10-24 LAB — HM DIABETES EYE EXAM

## 2013-10-31 ENCOUNTER — Encounter: Payer: Self-pay | Admitting: Family Medicine

## 2013-11-12 DIAGNOSIS — H353190 Nonexudative age-related macular degeneration, unspecified eye, stage unspecified: Secondary | ICD-10-CM | POA: Diagnosis not present

## 2013-11-12 DIAGNOSIS — H2510 Age-related nuclear cataract, unspecified eye: Secondary | ICD-10-CM | POA: Diagnosis not present

## 2013-11-12 DIAGNOSIS — H43819 Vitreous degeneration, unspecified eye: Secondary | ICD-10-CM | POA: Diagnosis not present

## 2013-11-25 DIAGNOSIS — J069 Acute upper respiratory infection, unspecified: Secondary | ICD-10-CM | POA: Diagnosis not present

## 2013-11-28 ENCOUNTER — Other Ambulatory Visit: Payer: Self-pay | Admitting: *Deleted

## 2013-11-28 MED ORDER — VENLAFAXINE HCL 37.5 MG PO TABS: ORAL_TABLET | ORAL | Status: DC

## 2013-11-28 MED ORDER — ALPRAZOLAM 0.5 MG PO TABS: ORAL_TABLET | ORAL | Status: DC

## 2013-11-28 NOTE — Telephone Encounter (Signed)
Pt requesting medication refill. Last f/u ov 08/2013 with no future appts scheduled. pls advise

## 2013-11-28 NOTE — Telephone Encounter (Signed)
Medication phoned to pharmacy.  

## 2013-11-28 NOTE — Telephone Encounter (Signed)
Pt left v/m requesting alprazolam be refilled today.Please advise.

## 2013-11-28 NOTE — Telephone Encounter (Signed)
please call in.  Thanks. Long term refill per PCP.

## 2013-12-12 DIAGNOSIS — J309 Allergic rhinitis, unspecified: Secondary | ICD-10-CM | POA: Diagnosis not present

## 2013-12-12 DIAGNOSIS — R0982 Postnasal drip: Secondary | ICD-10-CM | POA: Diagnosis not present

## 2013-12-16 ENCOUNTER — Encounter: Payer: Self-pay | Admitting: Family Medicine

## 2013-12-16 ENCOUNTER — Ambulatory Visit (INDEPENDENT_AMBULATORY_CARE_PROVIDER_SITE_OTHER): Payer: BC Managed Care – PPO | Admitting: Family Medicine

## 2013-12-16 VITALS — BP 136/78 | HR 114 | Temp 97.9°F | Wt 144.5 lb

## 2013-12-16 DIAGNOSIS — J209 Acute bronchitis, unspecified: Secondary | ICD-10-CM

## 2013-12-16 MED ORDER — FLUCONAZOLE 150 MG PO TABS: 150.0000 mg | ORAL_TABLET | Freq: Once | ORAL | Status: DC

## 2013-12-16 MED ORDER — AZITHROMYCIN 250 MG PO TABS: ORAL_TABLET | ORAL | Status: DC

## 2013-12-16 MED ORDER — ALBUTEROL SULFATE HFA 108 (90 BASE) MCG/ACT IN AERS: 2.0000 | INHALATION_SPRAY | RESPIRATORY_TRACT | Status: DC | PRN

## 2013-12-16 NOTE — Patient Instructions (Signed)
It's great to see you. Take a Zpack as directed. Albuterol as needed for wheezing or cough.  Continue your Zyrtec and Singulair.  Call me with an update if it does not get better in next several days.

## 2013-12-16 NOTE — Progress Notes (Signed)
Pre visit review using our clinic review tool, if applicable. No additional management support is needed unless otherwise documented below in the visit note. 

## 2013-12-16 NOTE — Progress Notes (Signed)
SUBJECTIVE:  Sara Stafford is a 68 y.o. female who complains of coryza, congestion, sneezing, sore throat, post nasal drip, dry cough and pain while swallowing for 10 days. She denies a history of anorexia and chest pain and admits to a history of asthma. Patient denies smoke cigarettes.   Takes Zyrtec and singulair daily.  Went to walk in clinic over the weekend- no rx was given.  We do not have these records. Current Outpatient Prescriptions on File Prior to Visit  Medication Sig Dispense Refill  . albuterol (VENTOLIN HFA) 108 (90 BASE) MCG/ACT inhaler Inhale 2 puffs into the lungs every 4 (four) hours as needed.        . ALPRAZolam (XANAX) 0.5 MG tablet TAKE ONE-HALF TABLET BY MOUTH TWICE DAILY  30 tablet  0  . aspirin 81 MG tablet Take 81 mg by mouth as needed.        . cetirizine (ZYRTEC) 10 MG tablet TAKE ONE TABLET BY MOUTH ONCE DAILY  90 tablet  0  . fluticasone (FLONASE) 50 MCG/ACT nasal spray Place 1 spray into the nose daily.  48 g  1  . glipiZIDE (GLUCOTROL XL) 10 MG 24 hr tablet TAKE TWO TABLETS BY MOUTH EVERY DAY  60 tablet  5  . metFORMIN (GLUCOPHAGE) 1000 MG tablet Take 1 tablet (1,000 mg total) by mouth 2 (two) times daily with a meal.  60 tablet  6  . montelukast (SINGULAIR) 10 MG tablet Take 1 tablet (10 mg total) by mouth at bedtime.  90 tablet  0  . naproxen sodium (ALEVE) 220 MG tablet Take 220 mg by mouth. otc as directed       . omeprazole (PRILOSEC) 20 MG capsule TAKE ONE CAPSULE BY MOUTH EVERY DAY AS NEEDED  30 capsule  5  . simvastatin (ZOCOR) 80 MG tablet Take 0.5 tablets (40 mg total) by mouth at bedtime.  45 tablet  1  . venlafaxine (EFFEXOR) 37.5 MG tablet TAKE ONE-HALF TABLET BY MOUTH TWICE DAILY  90 tablet  0  . mometasone-formoterol (DULERA) 100-5 MCG/ACT AERO Inhale 2 puffs into the lungs 2 (two) times daily.  3 Inhaler  1  . [DISCONTINUED] omeprazole (PRILOSEC OTC) 20 MG tablet Take 1 tablet (20 mg total) by mouth daily as needed.  90 tablet  1   No  current facility-administered medications on file prior to visit.    Allergies  Allergen Reactions  . Doxycycline     REACTION: thrush    Past Medical History  Diagnosis Date  . COPD (chronic obstructive pulmonary disease)     no change with spiriva, poor tolerance of advair  . Routine general medical examination at a health care facility   . GERD (gastroesophageal reflux disease)   . Hyperlipidemia   . Fibromyalgia   . Fatigue   . Depressive disorder, not elsewhere classified   . Diabetes mellitus     Past Surgical History  Procedure Laterality Date  . Abdominal hysterectomy      partial    Family History  Problem Relation Age of Onset  . Stroke Father 2280  . Diabetes Father   . Asthma Father   . Liver cancer Sister 5981  . Pancreatic cancer Sister 5572  . Brain cancer Brother   . Diabetes Sister   . Diabetes Sister   . Diabetes Sister   . Diabetes Sister   . Diabetes Sister   . Diabetes Sister   . Diabetes Sister   . Diabetes Paternal Grandmother  History   Social History  . Marital Status: Married    Spouse Name: N/A    Number of Children: 2  . Years of Education: N/A   Occupational History  . retired     [employer]   Social History Main Topics  . Smoking status: Former Smoker -- 1.00 packs/day for 50 years    Quit date: 06/06/2010  . Smokeless tobacco: Not on file  . Alcohol Use: Not on file  . Drug Use: Not on file  . Sexual Activity: Not on file   Other Topics Concern  . Not on file   Social History Narrative   Children aged 9, and 66, expecting 2nd grandchild   The PMH, PSH, Social History, Family History, Medications, and allergies have been reviewed in [facility], and have been updated if relevant.  OBJECTIVE: BP 136/78  Pulse 114  Temp(Src) 97.9 F (36.6 C) (Oral)  Wt 144 lb 8 oz (65.545 kg)  SpO2 92%  She appears well, vital signs are as noted. Ears normal.  Throat and pharynx normal.  Neck supple. No adenopathy in the neck. Nose  is congested. Sinuses non tender.  +exp wheezes  ASSESSMENT:  bronchitis  PLAN: Given duration and progression of symptoms with h/o asthma, will treat for bacterial bronchitis with zpack, prn albuterol.  Symptomatic therapy suggested: push fluids, rest and return office visit prn if symptoms persist or worsen. Call or return to clinic prn if these symptoms worsen or fail to improve as anticipated.

## 2013-12-24 ENCOUNTER — Telehealth: Payer: Self-pay | Admitting: Family Medicine

## 2013-12-24 ENCOUNTER — Encounter: Payer: Self-pay | Admitting: Family Medicine

## 2013-12-24 ENCOUNTER — Ambulatory Visit (INDEPENDENT_AMBULATORY_CARE_PROVIDER_SITE_OTHER): Payer: BC Managed Care – PPO | Admitting: Family Medicine

## 2013-12-24 ENCOUNTER — Other Ambulatory Visit: Payer: Self-pay | Admitting: Pulmonary Disease

## 2013-12-24 VITALS — BP 122/62 | HR 95 | Temp 97.9°F | Wt 143.5 lb

## 2013-12-24 DIAGNOSIS — J449 Chronic obstructive pulmonary disease, unspecified: Secondary | ICD-10-CM

## 2013-12-24 MED ORDER — PREDNISONE 20 MG PO TABS: ORAL_TABLET | ORAL | Status: DC

## 2013-12-24 NOTE — Telephone Encounter (Signed)
Spoke to pt and informed her Dr Dayton MartesAron did not have any appts today. Pt sched with Dr Para Marchuncan @ [phone]

## 2013-12-24 NOTE — Patient Instructions (Signed)
Start back on the dulera, use the albuterol if needed, and start the prednisone today.  Take it with foods.  Take care. This should get better. Glad to see you.

## 2013-12-24 NOTE — Telephone Encounter (Signed)
Patient Information:  Caller Name: Sara Stafford  Phone: [phone]  Patient: Sara Stafford, Sara Stafford  Gender: Female  DOB: [birth year]  Age: [age] Years  PCP: Ruthe MannanAron, [facility](Family Practice)  Office Follow Up:  Does the office need to follow up with this patient?: Yes  Instructions For The Office: Appoointment not available with Dr Dayton MartesAron -- patient says she was told to just come in and she would be seen by Dr Dayton MartesAron even if she had to wait a bit.   Is about 30 minutes away -- wanting appointment today with Dr Dayton MartesAron. Please review and contact patient at [phone].   Symptoms  Reason For Call & Symptoms: Had had cough for 2.5 weeks, seen in office last week and started, completed Azithromycin - throat is better.   Since Rx is still coughing, now getting SOB, short winded with activity including mopping floor, stooping down.  Coughing up clear stringy mucus.  Needing inhaler several times per day for breathing.  Reviewed Health History In EMR: Yes  Reviewed Medications In EMR: Yes  Reviewed Allergies In EMR: Yes  Reviewed Surgeries / Procedures: Yes  Date of Onset of Symptoms: 12/06/2013  Treatments Tried: Inhaler  Treatments Tried Worked: Yes  Guideline(s) Used:  Cough  Disposition Per Guideline:   See Today in Office  Reason For Disposition Reached:   Known COPD or other severe lung disease (i.e., bronchiectasis, cystic fibrosis, lung surgery) and worsening symptoms (i.e., increased sputum purulence or amount, increased breathing difficulty)  Advice Given:  Call Back If:  Difficulty breathing occurs  You become worse

## 2013-12-24 NOTE — Progress Notes (Signed)
Pre visit review using our clinic review tool, if applicable. No additional management support is needed unless otherwise documented below in the visit note.  She was seen at an UC on ~12/04/13.  Initially with ST. No tx at that point.  Cough started thereafter, got seen by Dr. Dayton MartesAron on 12/16/13.  started on zmax at that point.  Clear sputum at that point.  She added on SABA at that point, was prev on dulera. She has coughed until she nearly vomited. Has likely pulled muscles in her back from coughing.  SABA helps a little, for about a few hours.  She used it early this AM, then again at ~11 AM today.  Breathing is worse in the heat recently.  No fevers now.  L ear pain noted recently.   She is off dulera now. It had prev helped.    H/o Dm2 with last A1c ~7, that is near her baseline.  Former smoker.   Meds, vitals, and allergies reviewed.   ROS: See HPI.  Otherwise, noncontributory.  GEN: nad, alert and oriented HEENT: mucous membranes moist, tm w/o erythema (L cerumen impaction removed with curette, tolerated well, ear feels normal after removal), nasal exam w/o erythema, clear discharge noted,  OP with slight cobblestoning NECK: supple w/o LA CV: rrr.   PULM: ctab except for very scant exp wheeze, no inc wob, but prolonged exp phase noted EXT: no edema SKIN: no acute rash

## 2013-12-25 NOTE — Assessment & Plan Note (Signed)
Advised to start back on dulera, continue SABA prn for now and add on prednisone with routine cautions.  She agrees.  Okay for outpatient f/u.  Not in distress at all.  Wouldn't ext abx at this point.  L ear improved, see above.

## 2014-01-06 ENCOUNTER — Other Ambulatory Visit: Payer: Self-pay | Admitting: *Deleted

## 2014-01-06 MED ORDER — MONTELUKAST SODIUM 10 MG PO TABS: 10.0000 mg | ORAL_TABLET | Freq: Every day | ORAL | Status: DC

## 2014-01-06 MED ORDER — GLIPIZIDE ER 10 MG PO TB24: ORAL_TABLET | ORAL | Status: DC

## 2014-01-10 DIAGNOSIS — Z23 Encounter for immunization: Secondary | ICD-10-CM | POA: Diagnosis not present

## 2014-01-27 ENCOUNTER — Telehealth: Payer: Self-pay | Admitting: *Deleted

## 2014-01-27 MED ORDER — OMEPRAZOLE 20 MG PO CPDR: DELAYED_RELEASE_CAPSULE | ORAL | Status: DC

## 2014-01-27 MED ORDER — METFORMIN HCL 1000 MG PO TABS: 1000.0000 mg | ORAL_TABLET | Freq: Two times a day (BID) | ORAL | Status: DC

## 2014-01-27 NOTE — Addendum Note (Signed)
Addended by: Desmond Dike on: 01/27/2014 11:23 AM   Modules accepted: Orders

## 2014-01-27 NOTE — Telephone Encounter (Signed)
Request for this medication was not received by fax until 01/24/14 and pulled this am. Rx will be sent to requested pharmacy

## 2014-01-27 NOTE — Telephone Encounter (Signed)
Patient left a voicemail that her pharmacy [facility] sent over refill request for her on Thursday for her acid reflux medication and Metformin and they have not heard back on these. Patient request that they be sent in and let her know that this has been done.

## 2014-01-27 NOTE — Telephone Encounter (Signed)
Spoke to pt and advised Rx sent to pharmacy 

## 2014-02-06 ENCOUNTER — Ambulatory Visit: Payer: Self-pay | Admitting: Family Medicine

## 2014-02-06 DIAGNOSIS — Z1231 Encounter for screening mammogram for malignant neoplasm of breast: Secondary | ICD-10-CM | POA: Diagnosis not present

## 2014-02-10 ENCOUNTER — Encounter: Payer: Self-pay | Admitting: Family Medicine

## 2014-02-21 ENCOUNTER — Other Ambulatory Visit: Payer: Self-pay | Admitting: Family Medicine

## 2014-02-21 NOTE — Telephone Encounter (Signed)
Last filled 11/28/2013

## 2014-02-24 MED ORDER — VENLAFAXINE HCL 37.5 MG PO TABS: ORAL_TABLET | ORAL | Status: DC

## 2014-02-24 MED ORDER — ALPRAZOLAM 0.5 MG PO TABS: ORAL_TABLET | ORAL | Status: DC

## 2014-02-24 NOTE — Addendum Note (Signed)
Addended by: Patience Musca on: 02/24/2014 10:29 AM   Modules accepted: Orders

## 2014-02-24 NOTE — Telephone Encounter (Signed)
Pt left /vm requesting refill effexor and alprazolam; effexor sent electronically and alprazolam called to [facility] [place].

## 2014-03-03 ENCOUNTER — Ambulatory Visit (INDEPENDENT_AMBULATORY_CARE_PROVIDER_SITE_OTHER): Payer: BC Managed Care – PPO | Admitting: Family Medicine

## 2014-03-03 ENCOUNTER — Encounter: Payer: Self-pay | Admitting: Family Medicine

## 2014-03-03 VITALS — BP 124/70 | HR 73 | Temp 97.7°F | Wt 141.5 lb

## 2014-03-03 DIAGNOSIS — Z23 Encounter for immunization: Secondary | ICD-10-CM

## 2014-03-03 DIAGNOSIS — J449 Chronic obstructive pulmonary disease, unspecified: Secondary | ICD-10-CM

## 2014-03-03 DIAGNOSIS — F32A Depression, unspecified: Secondary | ICD-10-CM

## 2014-03-03 DIAGNOSIS — E782 Mixed hyperlipidemia: Secondary | ICD-10-CM | POA: Diagnosis not present

## 2014-03-03 DIAGNOSIS — F329 Major depressive disorder, single episode, unspecified: Secondary | ICD-10-CM | POA: Diagnosis not present

## 2014-03-03 DIAGNOSIS — E119 Type 2 diabetes mellitus without complications: Secondary | ICD-10-CM | POA: Diagnosis not present

## 2014-03-03 LAB — COMPREHENSIVE METABOLIC PANEL
ALBUMIN: 4.6 g/dL (ref 3.5–5.2)
ALT: 19 U/L (ref 0–35)
AST: 24 U/L (ref 0–37)
Alkaline Phosphatase: 72 U/L (ref 39–117)
BUN: 14 mg/dL (ref 6–23)
CHLORIDE: 99 meq/L (ref 96–112)
CO2: 27 mEq/L (ref 19–32)
Calcium: 9.3 mg/dL (ref 8.4–10.5)
Creatinine, Ser: 0.6 mg/dL (ref 0.4–1.2)
GFR: 99.21 mL/min (ref >60.00)
Glucose, Bld: 168 mg/dL — ABNORMAL HIGH (ref 70–99)
POTASSIUM: 4.1 meq/L (ref 3.5–5.1)
SODIUM: 136 meq/L (ref 135–145)
Total Bilirubin: 0.7 mg/dL (ref 0.2–1.2)
Total Protein: 7.8 g/dL (ref 6.0–8.3)

## 2014-03-03 LAB — LIPID PANEL
CHOL/HDL RATIO: 5
Cholesterol: 192 mg/dL (ref 0–200)
HDL: 41 mg/dL (ref >39.00)
LDL CALC: 114 mg/dL — AB (ref 0–99)
NonHDL: 151
TRIGLYCERIDES: 186 mg/dL — AB (ref 0.0–149.0)
VLDL: 37.2 mg/dL (ref 0.0–40.0)

## 2014-03-03 LAB — HEMOGLOBIN A1C: Hgb A1c MFr Bld: 7.9 % — ABNORMAL HIGH (ref 4.6–6.5)

## 2014-03-03 NOTE — Progress Notes (Signed)
Pre visit review using our clinic review tool, if applicable. No additional management support is needed unless otherwise documented below in the visit note. 

## 2014-03-03 NOTE — Patient Instructions (Signed)
Great to see you.  We will call you with your lab results. 

## 2014-03-03 NOTE — Assessment & Plan Note (Addendum)
Symptoms controlled on current rx. Did have URI last week and had to use SABA more frequently. Feels better now. Prevnar 13 given today.

## 2014-03-03 NOTE — Addendum Note (Signed)
Addended by: Desmond DikeKNIGHT, Kailey Esquilin H on: 03/03/2014 10:26 AM   Modules accepted: Orders

## 2014-03-03 NOTE — Progress Notes (Signed)
Subjective:    Patient ID: Sara BastosCatherine B Cunnington, female    DOB: June 25, 1945, 68 y.o.   MRN: [id number]  HPI [age] pleasant female comes in today for followup.  On Metformin 1000 mg twice daily and glipizide 10 mg daily.    Denies any episodes of hypoglycemia.  Admits to not checking FSBS on regular basis.  Admits to not eating right although she feels she has been doing better.  Has lost a few pounds.  Has been to a diabetic educator and says she knows what to do..  She is compliant with her medications. Lab Results  Component Value Date   HGBA1C 7.3* 09/03/2013  Negative urine microalbumin on 09/03/13. Taking zocor 80 mg daily.  Lab Results  Component Value Date   CHOL 174 04/23/2013   HDL 42.70 04/23/2013   LDLCALC 102* 04/23/2013   LDLDIRECT 80.8 10/24/2012   TRIG 145.0 04/23/2013   CHOLHDL 4 04/23/2013   Lab Results  Component Value Date   ALT 18 04/23/2013   AST 18 04/23/2013   ALKPHOS 67 04/23/2013   BILITOT 0.5 04/23/2013    Depression- on Effexor and xanax.   Denies any worsening anxiety or depression.  COPD- mild.  Has been doing pretty well on current rx. Did have an exacerbation in July.  Saw Dr. Para Marchuncan on 12/16/13.  Note reviewed.  He advised her to restart dulera.  Patient Active Problem List   Diagnosis Date Noted  . MAMMOGRAM, ABNORMAL, LEFT 09/10/2009  . LUNG NODULE 03/20/2009  . ANEMIAS DUE TO DISORDERS GLUTATHIONE METABOLISM 07/31/2008  . COPD, mild 10/11/2007  . Diabetes 06/04/2007  . HYPERLIPIDEMIA 06/04/2007  . Depression 06/04/2007  . GERD 06/04/2007  . FIBROMYALGIA 06/04/2007   Past Medical History  Diagnosis Date  . COPD (chronic obstructive pulmonary disease)     no change with spiriva, poor tolerance of advair  . Routine general medical examination at a health care facility   . GERD (gastroesophageal reflux disease)   . Hyperlipidemia   . Fibromyalgia   . Fatigue   . Depressive disorder, not elsewhere classified   . Diabetes mellitus     Past Surgical History  Procedure Laterality Date  . Abdominal hysterectomy      partial   History  Substance Use Topics  . Smoking status: Former Smoker -- 1.00 packs/day for 50 years    Quit date: 06/06/2010  . Smokeless tobacco: Not on file  . Alcohol Use: Not on file   Family History  Problem Relation Age of Onset  . Stroke Father [id number]  . Diabetes Father   . Asthma Father   . Liver cancer Sister [phone]  . Pancreatic cancer Sister 372  . Brain cancer Brother   . Diabetes Sister   . Diabetes Sister   . Diabetes Sister   . Diabetes Sister   . Diabetes Sister   . Diabetes Sister   . Diabetes Sister   . Diabetes Paternal Grandmother    Allergies  Allergen Reactions  . Doxycycline     REACTION: thrush   Current Outpatient Prescriptions on File Prior to Visit  Medication Sig Dispense Refill  . albuterol (VENTOLIN HFA) 108 (90 BASE) MCG/ACT inhaler Inhale 2 puffs into the lungs every 4 (four) hours as needed.  1 Inhaler  0  . ALPRAZolam (XANAX) 0.5 MG tablet TAKE ONE-HALF TABLET BY MOUTH TWICE DAILY  30 tablet  0  . aspirin 81 MG tablet Take 81 mg by mouth as needed.        .Marland Kitchen  cetirizine (ZYRTEC) 10 MG tablet TAKE ONE TABLET BY MOUTH ONCE DAILY  90 tablet  0  . DULERA 100-5 MCG/ACT AERO INHALE TWO PUFFS INTO LUNGS TWICE DAILY  1 Inhaler  3  . fluticasone (FLONASE) 50 MCG/ACT nasal spray Place 1 spray into the nose daily.  48 g  1  . glipiZIDE (GLUCOTROL XL) 10 MG 24 hr tablet TAKE TWO TABLETS BY MOUTH EVERY DAY  60 tablet  2  . metFORMIN (GLUCOPHAGE) 1000 MG tablet Take 1 tablet (1,000 mg total) by mouth 2 (two) times daily with a meal.  60 tablet  2  . montelukast (SINGULAIR) 10 MG tablet Take 1 tablet (10 mg total) by mouth at bedtime.  90 tablet  0  . omeprazole (PRILOSEC) 20 MG capsule TAKE ONE CAPSULE BY MOUTH EVERY DAY AS NEEDED  30 capsule  5  . simvastatin (ZOCOR) 80 MG tablet Take 0.5 tablets (40 mg total) by mouth at bedtime.  45 tablet  1  . venlafaxine (EFFEXOR) 37.5  MG tablet TAKE ONE-HALF TABLET BY MOUTH TWICE DAILY  90 tablet  0  . [DISCONTINUED] omeprazole (PRILOSEC OTC) 20 MG tablet Take 1 tablet (20 mg total) by mouth daily as needed.  90 tablet  1   No current facility-administered medications on file prior to visit.   The PMH, PSH, Social History, Family History, Medications, and allergies have been reviewed in [facility], and have been updated if relevant.  Review of Systems  See HPI No n/v/d  +cough, no wheezing or SOB No CP No LE edema No neuropathy    Objective:   Physical Exam BP 124/70  Pulse 73  Temp(Src) 97.7 F (36.5 C) (Oral)  Wt 141 lb 8 oz (64.184 kg)  SpO2 97% Wt Readings from Last 3 Encounters:  03/03/14 141 lb 8 oz (64.184 kg)  12/24/13 143 lb 8 oz (65.091 kg)  12/16/13 144 lb 8 oz (65.545 kg)   General:  Well-developed,well-nourished,in no acute distress; alert,appropriate and cooperative throughout examination Head:  normocephalic and atraumatic.   Eyes:  vision grossly intact, pupils equal, pupils round, and pupils reactive to light.   Ears:  R ear normal and L ear normal.   Nose:  no external deformity.     Lungs:  Normal respiratory effort, chest expands symmetrically. Lungs are clear to auscultation, no crackles or wheezes. Heart:  Normal rate and regular rhythm. S1 and S2 normal without gallop, murmur, click, rub or other extra sounds. Abdomen:  Bowel sounds positive,abdomen soft and non-tender without masses, organomegaly or hernias noted. Msk:  No deformity or scoliosis noted of thoracic or lumbar spine.   Extremities:  No clubbing, cyanosis, edema, or deformity noted with normal full range of motion of all joints.   Neurologic:  alert & oriented X3 and gait normal.   Skin:  Intact without suspicious lesions or rashes Psych:  Cognition and judgment appear intact. Alert and cooperative with normal attention span and concentration. No apparent delusions, illusions, hallucinations    Assessment & Plan:

## 2014-03-03 NOTE — Assessment & Plan Note (Signed)
At goal for diabetic. Continue current rx. Check labs today.

## 2014-03-03 NOTE — Assessment & Plan Note (Signed)
Not quite at goal but she has been working on her diet. Will recheck a1c today. Urine micro UTD. She is on a statin.

## 2014-03-03 NOTE — Assessment & Plan Note (Signed)
Well controlled on current rx. No changes made today. 

## 2014-03-11 ENCOUNTER — Other Ambulatory Visit: Payer: Self-pay | Admitting: *Deleted

## 2014-03-11 DIAGNOSIS — J069 Acute upper respiratory infection, unspecified: Secondary | ICD-10-CM | POA: Diagnosis not present

## 2014-03-11 DIAGNOSIS — J449 Chronic obstructive pulmonary disease, unspecified: Secondary | ICD-10-CM | POA: Diagnosis not present

## 2014-03-11 MED ORDER — CETIRIZINE HCL 10 MG PO TABS: ORAL_TABLET | ORAL | Status: DC

## 2014-03-25 ENCOUNTER — Other Ambulatory Visit: Payer: Self-pay | Admitting: *Deleted

## 2014-03-25 MED ORDER — ALPRAZOLAM 0.5 MG PO TABS: ORAL_TABLET | ORAL | Status: DC

## 2014-03-25 NOTE — Telephone Encounter (Signed)
Pt requesting medication refill. Last f/u appt 02/2014. Ok to fill per Dr Aron. Rx to be faxed to requested pharmacy before end of day, today.  

## 2014-04-04 ENCOUNTER — Telehealth: Payer: Self-pay | Admitting: Pulmonary Disease

## 2014-04-04 NOTE — Telephone Encounter (Signed)
Spoke with pt. Coupon for dulera was mailed to pt. We did not have any samples of albuterol coupons. Nothing further needed

## 2014-04-08 ENCOUNTER — Other Ambulatory Visit: Payer: Self-pay | Admitting: *Deleted

## 2014-04-08 MED ORDER — GLIPIZIDE ER 10 MG PO TB24: ORAL_TABLET | ORAL | Status: DC

## 2014-04-14 ENCOUNTER — Telehealth: Payer: Self-pay | Admitting: Pulmonary Disease

## 2014-04-14 ENCOUNTER — Other Ambulatory Visit: Payer: Self-pay | Admitting: *Deleted

## 2014-04-14 MED ORDER — MONTELUKAST SODIUM 10 MG PO TABS: 10.0000 mg | ORAL_TABLET | Freq: Every day | ORAL | Status: DC

## 2014-04-14 NOTE — Telephone Encounter (Signed)
Called and spoke to pt. Pt stated she has not yet [facility]. Pt states she has over 2 weeks worth of inhalations on the inhaler.  Advised pt to call before the weekend if she has not yet received them. Pt verbalized understanding and denied any further questions or concerns at this time.

## 2014-04-15 ENCOUNTER — Telehealth: Payer: Self-pay | Admitting: Pulmonary Disease

## 2014-04-15 NOTE — Telephone Encounter (Signed)
lmtcb for pt.  

## 2014-04-16 NOTE — Telephone Encounter (Signed)
Called spoke with patient who reported that she did receive her Dulera coupon in the mail but unfortunately it is not valid with Medicare patients.  Pt is okay with this, just wanted to inform us.  [facility] and does have enough medication to last until her appt.  Nothing further needed; will sign off.

## 2014-04-17 ENCOUNTER — Other Ambulatory Visit: Payer: Self-pay | Admitting: *Deleted

## 2014-04-17 MED ORDER — ALBUTEROL SULFATE HFA 108 (90 BASE) MCG/ACT IN AERS: 2.0000 | INHALATION_SPRAY | RESPIRATORY_TRACT | Status: DC | PRN

## 2014-04-22 DIAGNOSIS — M79676 Pain in unspecified toe(s): Secondary | ICD-10-CM | POA: Diagnosis not present

## 2014-04-22 DIAGNOSIS — E1165 Type 2 diabetes mellitus with hyperglycemia: Secondary | ICD-10-CM | POA: Diagnosis not present

## 2014-04-22 DIAGNOSIS — M2040 Other hammer toe(s) (acquired), unspecified foot: Secondary | ICD-10-CM | POA: Diagnosis not present

## 2014-04-23 ENCOUNTER — Other Ambulatory Visit: Payer: Self-pay | Admitting: *Deleted

## 2014-04-23 MED ORDER — ALPRAZOLAM 0.5 MG PO TABS: ORAL_TABLET | ORAL | Status: DC

## 2014-04-23 NOTE — Telephone Encounter (Signed)
Rx called in to requested pharmacy 

## 2014-04-23 NOTE — Telephone Encounter (Signed)
#  30x0 last filled 03/25/14. Pt's last ov was on 03/03/14, and she has a cpx scheduled 06/02/14.

## 2014-04-28 ENCOUNTER — Other Ambulatory Visit: Payer: Self-pay | Admitting: *Deleted

## 2014-04-28 MED ORDER — METFORMIN HCL 1000 MG PO TABS: 1000.0000 mg | ORAL_TABLET | Freq: Two times a day (BID) | ORAL | Status: DC

## 2014-04-28 NOTE — Telephone Encounter (Signed)
Received faxed refill request from pharmacy. Refill sent to pharmacy electronically. 

## 2014-04-30 ENCOUNTER — Ambulatory Visit (INDEPENDENT_AMBULATORY_CARE_PROVIDER_SITE_OTHER): Payer: BC Managed Care – PPO | Admitting: Pulmonary Disease

## 2014-04-30 ENCOUNTER — Encounter: Payer: Self-pay | Admitting: Pulmonary Disease

## 2014-04-30 ENCOUNTER — Ambulatory Visit (INDEPENDENT_AMBULATORY_CARE_PROVIDER_SITE_OTHER)
Admission: RE | Admit: 2014-04-30 | Discharge: 2014-04-30 | Disposition: A | Discharge status: Home / Self Care | Payer: BC Managed Care – PPO | Source: Ambulatory Visit | Attending: Pulmonary Disease | Admitting: Pulmonary Disease

## 2014-04-30 VITALS — BP 116/62 | HR 88 | Temp 98.0°F | Ht 65.0 in | Wt 148.2 lb

## 2014-04-30 DIAGNOSIS — J449 Chronic obstructive pulmonary disease, unspecified: Secondary | ICD-10-CM | POA: Diagnosis not present

## 2014-04-30 DIAGNOSIS — J439 Emphysema, unspecified: Secondary | ICD-10-CM | POA: Diagnosis not present

## 2014-04-30 DIAGNOSIS — J438 Other emphysema: Secondary | ICD-10-CM

## 2014-04-30 DIAGNOSIS — R918 Other nonspecific abnormal finding of lung field: Secondary | ICD-10-CM | POA: Diagnosis not present

## 2014-04-30 NOTE — Assessment & Plan Note (Signed)
The patient is really doing well from a COPD standpoint on her current regimen. She leads a very active lifestyle, and has not had an acute exacerbation or pulmonary infection since the last visit. I have asked her to continue on her current regimen, and to follow-up with me in one year.

## 2014-04-30 NOTE — Progress Notes (Signed)
   Subjective:    Patient ID: Sara Stafford, female    DOB: 1946/04/02, 68 y.o.   MRN: [id number]  HPI The patient comes in today for follow-up of her known COPD. She has been compliant with her bronchodilator regimen, and has not had an acute exacerbation or pulmonary infection since last visit. She continues to enjoy an active lifestyle, and is satisfied with her breathing at this point. She has already had her flu shot this year.   Review of Systems  Constitutional: Negative for fever and unexpected weight change.  HENT: Positive for congestion. Negative for dental problem, ear pain, nosebleeds, postnasal drip, rhinorrhea, sinus pressure, sneezing, sore throat and trouble swallowing.   Eyes: Negative for redness and itching.  Respiratory: Positive for cough. Negative for chest tightness, shortness of breath and wheezing.   Cardiovascular: Negative for palpitations and leg swelling.  Gastrointestinal: Negative for nausea and vomiting.  Genitourinary: Negative for dysuria.  Musculoskeletal: Negative for joint swelling.  Skin: Negative for rash.  Neurological: Negative for headaches.  Hematological: Does not bruise/bleed easily.  Psychiatric/Behavioral: Negative for dysphoric mood. The patient is not nervous/anxious.        Objective:   Physical Exam Well-developed female in no acute distress Nose without purulence or discharge noted Neck without lymphadenopathy or thyromegaly Chest with mildly decreased breath sounds, no wheezing Cardiac exam with regular rate and rhythm Lower extremities without edema, no cyanosis Alert and oriented, moves all 4 extremities.       Assessment & Plan:

## 2014-04-30 NOTE — Patient Instructions (Signed)
No change in medications. Continue to stay active as you are doing Will check chest xray today, and call you with results. followup with me again in one year

## 2014-04-30 NOTE — Assessment & Plan Note (Signed)
The patient has not had a regular chest x-ray in a few years, and we'll therefore check a chest x-ray today for completeness.

## 2014-05-02 ENCOUNTER — Telehealth: Payer: Self-pay | Admitting: Pulmonary Disease

## 2014-05-02 MED ORDER — AZITHROMYCIN 250 MG PO TABS: ORAL_TABLET | ORAL | Status: DC

## 2014-05-02 NOTE — Telephone Encounter (Signed)
Ok to call in a zpak.  

## 2014-05-02 NOTE — Telephone Encounter (Signed)
Pt states she woke up this morning with head and chest congestion, pnd, prod cough with green mucus.  Is requesting an abx be sent to [facility] in [place]. Last visit:04/30/14 Next visit: 05/01/15  [facility] please advise.  Thanks!  Allergies  Allergen Reactions  . Doxycycline     REACTION: thrush   Current Outpatient Prescriptions on File Prior to Visit  Medication Sig Dispense Refill  . albuterol (VENTOLIN HFA) 108 (90 BASE) MCG/ACT inhaler Inhale 2 puffs into the lungs every 4 (four) hours as needed. 1 Inhaler 2  . ALPRAZolam (XANAX) 0.5 MG tablet TAKE ONE-HALF TABLET BY MOUTH TWICE DAILY 30 tablet 0  . aspirin 81 MG tablet Take 81 mg by mouth as needed.      . cetirizine (ZYRTEC) 10 MG tablet TAKE ONE TABLET BY MOUTH ONCE DAILY 90 tablet 2  . DULERA 100-5 MCG/ACT AERO INHALE TWO PUFFS INTO LUNGS TWICE DAILY 1 Inhaler 3  . fluticasone (FLONASE) 50 MCG/ACT nasal spray Place 1 spray into the nose daily. 48 g 1  . glipiZIDE (GLUCOTROL XL) 10 MG 24 hr tablet TAKE TWO TABLETS BY MOUTH EVERY DAY 60 tablet 5  . metFORMIN (GLUCOPHAGE) 1000 MG tablet Take 1 tablet (1,000 mg total) by mouth 2 (two) times daily with a meal. 60 tablet 5  . montelukast (SINGULAIR) 10 MG tablet Take 1 tablet (10 mg total) by mouth at bedtime. 90 tablet 1  . omeprazole (PRILOSEC) 20 MG capsule TAKE ONE CAPSULE BY MOUTH EVERY DAY AS NEEDED 30 capsule 5  . simvastatin (ZOCOR) 80 MG tablet Take 0.5 tablets (40 mg total) by mouth at bedtime. 45 tablet 1  . venlafaxine (EFFEXOR) 37.5 MG tablet TAKE ONE-HALF TABLET BY MOUTH TWICE DAILY 90 tablet 0  . [DISCONTINUED] omeprazole (PRILOSEC OTC) 20 MG tablet Take 1 tablet (20 mg total) by mouth daily as needed. 90 tablet 1   No current facility-administered medications on file prior to visit.

## 2014-05-02 NOTE — Telephone Encounter (Signed)
Pt aware of recs.  Med sent in.  Nothing further needed.  

## 2014-05-07 ENCOUNTER — Other Ambulatory Visit: Payer: Self-pay | Admitting: *Deleted

## 2014-05-07 MED ORDER — SIMVASTATIN 80 MG PO TABS: 40.0000 mg | ORAL_TABLET | Freq: Every day | ORAL | Status: DC

## 2014-05-13 DIAGNOSIS — H43813 Vitreous degeneration, bilateral: Secondary | ICD-10-CM | POA: Diagnosis not present

## 2014-05-13 DIAGNOSIS — H3531 Nonexudative age-related macular degeneration: Secondary | ICD-10-CM | POA: Diagnosis not present

## 2014-05-20 ENCOUNTER — Other Ambulatory Visit: Payer: Self-pay

## 2014-05-20 MED ORDER — VENLAFAXINE HCL 37.5 MG PO TABS: ORAL_TABLET | ORAL | Status: DC

## 2014-05-20 MED ORDER — ALPRAZOLAM 0.5 MG PO TABS: ORAL_TABLET | ORAL | Status: DC

## 2014-05-20 NOTE — Telephone Encounter (Signed)
Pt left v/m requesting refill xanax to [facility] [place]. Pt wants to know if she can get refills added to xanax prescription.pt request cb.pts last f/u appt on 03/03/14.

## 2014-05-20 NOTE — Telephone Encounter (Signed)
Rx called in as directed. Patient notified.  

## 2014-05-20 NOTE — Addendum Note (Signed)
Addended by: Josph MachoANCE, Deah Ottaway A on: 05/20/2014 04:00 PM   Modules accepted: Orders

## 2014-06-02 ENCOUNTER — Encounter: Payer: Self-pay | Admitting: Family Medicine

## 2014-06-02 ENCOUNTER — Ambulatory Visit (INDEPENDENT_AMBULATORY_CARE_PROVIDER_SITE_OTHER): Payer: BLUE CROSS/BLUE SHIELD | Admitting: Family Medicine

## 2014-06-02 VITALS — BP 122/78 | HR 93 | Temp 97.7°F | Ht 64.75 in | Wt 139.0 lb

## 2014-06-02 DIAGNOSIS — E119 Type 2 diabetes mellitus without complications: Secondary | ICD-10-CM

## 2014-06-02 DIAGNOSIS — E782 Mixed hyperlipidemia: Secondary | ICD-10-CM | POA: Diagnosis not present

## 2014-06-02 DIAGNOSIS — Z0000 Encounter for general adult medical examination without abnormal findings: Secondary | ICD-10-CM | POA: Diagnosis not present

## 2014-06-02 DIAGNOSIS — H3530 Unspecified macular degeneration: Secondary | ICD-10-CM

## 2014-06-02 DIAGNOSIS — J438 Other emphysema: Secondary | ICD-10-CM | POA: Diagnosis not present

## 2014-06-02 DIAGNOSIS — Z1211 Encounter for screening for malignant neoplasm of colon: Secondary | ICD-10-CM | POA: Diagnosis not present

## 2014-06-02 LAB — HEMOGLOBIN A1C: Hgb A1c MFr Bld: 8 % — ABNORMAL HIGH (ref 4.6–6.5)

## 2014-06-02 LAB — COMPREHENSIVE METABOLIC PANEL
ALK PHOS: 69 U/L (ref 39–117)
ALT: 12 U/L (ref 0–35)
AST: 21 U/L (ref 0–37)
Albumin: 4.5 g/dL (ref 3.5–5.2)
BILIRUBIN TOTAL: 0.6 mg/dL (ref 0.2–1.2)
BUN: 12 mg/dL (ref 6–23)
CO2: 28 mEq/L (ref 19–32)
Calcium: 9.4 mg/dL (ref 8.4–10.5)
Chloride: 101 mEq/L (ref 96–112)
Creatinine, Ser: 0.7 mg/dL (ref 0.4–1.2)
GFR: 89.26 mL/min (ref >60.00)
Glucose, Bld: 193 mg/dL — ABNORMAL HIGH (ref 70–99)
Potassium: 4 mEq/L (ref 3.5–5.1)
SODIUM: 138 meq/L (ref 135–145)
TOTAL PROTEIN: 7.1 g/dL (ref 6.0–8.3)

## 2014-06-02 LAB — LIPID PANEL
CHOLESTEROL: 157 mg/dL (ref 0–200)
HDL: 38.2 mg/dL — ABNORMAL LOW (ref >39.00)
LDL Cholesterol: 81 mg/dL (ref 0–99)
NonHDL: 118.8
TRIGLYCERIDES: 191 mg/dL — AB (ref 0.0–149.0)
Total CHOL/HDL Ratio: 4
VLDL: 38.2 mg/dL (ref 0.0–40.0)

## 2014-06-02 MED ORDER — ALPRAZOLAM 0.5 MG PO TABS: ORAL_TABLET | ORAL | Status: DC

## 2014-06-02 NOTE — Progress Notes (Signed)
Pre visit review using our clinic review tool, if applicable. No additional management support is needed unless otherwise documented below in the visit note. 

## 2014-06-02 NOTE — Addendum Note (Signed)
Addended by: Dianne Dun on: 06/02/2014 10:03 AM   Modules accepted: Kipp Brood

## 2014-06-02 NOTE — Progress Notes (Signed)
Subjective:    Patient ID: Sara Stafford, female    DOB: [birth year], 69 y.o.   MRN: [id number]  HPI 56 pleasant female here for annual medicare wellness visit.  I have personally reviewed the Medicare Annual Wellness questionnaire and have noted 1. The patient's medical and social history 2. Their use of alcohol, tobacco or illicit drugs 3. Their current medications and supplements 4. The patient's functional ability including ADL's, fall risks, home safety risks and hearing or visual             impairment. 5. Diet and physical activities 6. Evidence for depression or mood disorders  The roster of all physicians providing medical care to patient - is listed in the Snapshot section of the chart.  Mammogram 02/06/14 Eye exam 10/24/13, went to retinal specialist, Dr. Allyne Gee in 04/2014 for MD. Zoster 10/13/09 Pneumovax 09/17/09 Prevnar 13 03/03/14 Influenza vaccine 01/28/2014 Has never had a colonoscopy.  Denies any changes in her bowel habits or blood in her stool. No post menopausal bleeding.  DM- not well controlled 3 months ago.  On Metformin 1000 mg twice daily and glipizide 10 mg daily.    Denies any episodes of hypoglycemia.  Admits to not checking FSBS on regular basis.  Admits to not eating right although she feels she has been doing better.  Has lost a few pounds.  Has been to a diabetic educator and says she knows what to do..  She is compliant with her medications.  Refused endo referral previously- wanted to work on diet. Lab Results  Component Value Date   HGBA1C 7.9* 03/03/2014  Negative urine microalbumin on 09/03/13.  Has been working on her diet.  Wt Readings from Last 3 Encounters:  06/02/14 139 lb (63.05 kg)  04/30/14 148 lb 3.2 oz (67.223 kg)  03/03/14 141 lb 8 oz (64.184 kg)    Taking zocor 80 mg daily.  Lab Results  Component Value Date   CHOL 192 03/03/2014   HDL 41.00 03/03/2014   LDLCALC 114* 03/03/2014   LDLDIRECT 80.8 10/24/2012   TRIG  186.0* 03/03/2014   CHOLHDL 5 03/03/2014   Lab Results  Component Value Date   ALT 19 03/03/2014   AST 24 03/03/2014   ALKPHOS 72 03/03/2014   BILITOT 0.7 03/03/2014   Lab Results  Component Value Date   WBC 6.6 04/23/2013   HGB 14.0 04/23/2013   HCT 42.1 04/23/2013   MCV 90.5 04/23/2013   PLT 307.0 04/23/2013    Depression- on Effexor and xanax.   Denies any worsening anxiety or depression.  COPD- mild.  Has been doing pretty well on current rx. Saw Dr. Shelle Iron on 04/30/14- note reviewed, no changes made. Did have a cough/cold last week.  Still has a cough but does feel symptoms improving.  Patient Active Problem List   Diagnosis Date Noted  . Medicare annual wellness visit, subsequent 06/02/2014  . Macular degeneration 06/02/2014  . MAMMOGRAM, ABNORMAL, LEFT 09/10/2009  . Pulmonary nodules 03/20/2009  . ANEMIAS DUE TO DISORDERS GLUTATHIONE METABOLISM 07/31/2008  . COPD (chronic obstructive pulmonary disease) with emphysema 10/11/2007  . Diabetes 06/04/2007  . HYPERLIPIDEMIA 06/04/2007  . Depression 06/04/2007  . GERD 06/04/2007  . FIBROMYALGIA 06/04/2007   Past Medical History  Diagnosis Date  . COPD (chronic obstructive pulmonary disease)     no change with spiriva, poor tolerance of advair  . Routine general medical examination at a health care facility   . GERD (gastroesophageal reflux disease)   .  Hyperlipidemia   . Fibromyalgia   . Fatigue   . Depressive disorder, not elsewhere classified   . Diabetes mellitus    Past Surgical History  Procedure Laterality Date  . Abdominal hysterectomy      partial   History  Substance Use Topics  . Smoking status: Former Smoker -- 1.00 packs/day for 50 years    Quit date: 06/06/2010  . Smokeless tobacco: Not on file  . Alcohol Use: Not on file   Family History  Problem Relation Age of Onset  . Stroke Father 2  . Diabetes Father   . Asthma Father   . Liver cancer Sister 46  . Pancreatic cancer Sister 48  .  Brain cancer Brother   . Diabetes Sister   . Diabetes Sister   . Diabetes Sister   . Diabetes Sister   . Diabetes Sister   . Diabetes Sister   . Diabetes Sister   . Diabetes Paternal Grandmother    Allergies  Allergen Reactions  . Doxycycline     REACTION: thrush   Current Outpatient Prescriptions on File Prior to Visit  Medication Sig Dispense Refill  . albuterol (VENTOLIN HFA) 108 (90 BASE) MCG/ACT inhaler Inhale 2 puffs into the lungs every 4 (four) hours as needed. 1 Inhaler 2  . ALPRAZolam (XANAX) 0.5 MG tablet TAKE ONE-HALF TABLET BY MOUTH TWICE DAILY 30 tablet 0  . aspirin 81 MG tablet Take 81 mg by mouth as needed.      Marland Kitchen azithromycin (ZITHROMAX Z-PAK) 250 MG tablet Take 2 tabs today, then 1 daily until gone. 6 each 0  . cetirizine (ZYRTEC) 10 MG tablet TAKE ONE TABLET BY MOUTH ONCE DAILY 90 tablet 2  . DULERA 100-5 MCG/ACT AERO INHALE TWO PUFFS INTO LUNGS TWICE DAILY 1 Inhaler 3  . fluticasone (FLONASE) 50 MCG/ACT nasal spray Place 1 spray into the nose daily. 48 g 1  . glipiZIDE (GLUCOTROL XL) 10 MG 24 hr tablet TAKE TWO TABLETS BY MOUTH EVERY DAY 60 tablet 5  . metFORMIN (GLUCOPHAGE) 1000 MG tablet Take 1 tablet (1,000 mg total) by mouth 2 (two) times daily with a meal. 60 tablet 5  . montelukast (SINGULAIR) 10 MG tablet Take 1 tablet (10 mg total) by mouth at bedtime. 90 tablet 1  . omeprazole (PRILOSEC) 20 MG capsule TAKE ONE CAPSULE BY MOUTH EVERY DAY AS NEEDED 30 capsule 5  . simvastatin (ZOCOR) 80 MG tablet Take 0.5 tablets (40 mg total) by mouth at bedtime. 45 tablet 1  . venlafaxine (EFFEXOR) 37.5 MG tablet TAKE ONE-HALF TABLET BY MOUTH TWICE DAILY 90 tablet 0  . [DISCONTINUED] omeprazole (PRILOSEC OTC) 20 MG tablet Take 1 tablet (20 mg total) by mouth daily as needed. 90 tablet 1   No current facility-administered medications on file prior to visit.   The PMH, PSH, Social History, Family History, Medications, and allergies have been reviewed in [facility], and have been  updated if relevant.  Review of Systems  See HPI No n/v/d  No CP No LE edema She is a breast CA survivor- s/p lumpectomy, chemo and radiation (2010)- followed by Dr. Evette Cristal and [facility].  UTD on mammograms. + positive intermittent neuropathy   Denies any depression or anxiety No rashes No HA No blurred vision No joint pain  Objective:   Physical Exam BP 122/78 mmHg  Pulse 93  Temp(Src) 97.7 F (36.5 C) (Oral)  Ht 5' 4.75" (1.645 m)  Wt 139 lb (63.05 kg)  BMI 23.30 kg/m2  SpO2  94% Wt Readings from Last 3 Encounters:  06/02/14 139 lb (63.05 kg)  04/30/14 148 lb 3.2 oz (67.223 kg)  03/03/14 141 lb 8 oz (64.184 kg)   General:  Well-developed,well-nourished,in no acute distress; alert,appropriate and cooperative throughout examination Head:  normocephalic and atraumatic.   Eyes:  vision grossly intact, pupils equal, pupils round, and pupils reactive to light.   Ears:  R ear normal and L ear normal.   Nose:  no external deformity.     Lungs:  Normal respiratory effort, chest expands symmetrically. Lungs are clear to auscultation, no crackles or wheezes. Heart:  Normal rate and regular rhythm. S1 and S2 normal without gallop, murmur, click, rub or other extra sounds. Abdomen:  Bowel sounds positive,abdomen soft and non-tender without masses, organomegaly or hernias noted. Msk:  No deformity or scoliosis noted of thoracic or lumbar spine.   Extremities:  No clubbing, cyanosis, edema, or deformity noted with normal full range of motion of all joints.   Neurologic:  alert & oriented X3 and gait normal.   Skin:  Intact without suspicious lesions or rashes Psych:  Cognition and judgment appear intact. Alert and cooperative with normal attention span and concentration. No apparent delusions, illusions, hallucinations    Assessment & Plan:

## 2014-06-02 NOTE — Assessment & Plan Note (Signed)
The patients weight, height, BMI and visual acuity have been recorded in the chart I have made referrals, counseling and provided education to the patient based review of the above and I have provided the pt with a written personalized care plan for preventive services.  Agrees to IFOB, declines colonoscopy.

## 2014-06-02 NOTE — Assessment & Plan Note (Signed)
Has not been well controlled but she has been working on her diet. Recheck a1c today. LDL almost at goal for a diabetic- recheck lipid panel today. Eye exam, urine micro UTD.

## 2014-06-02 NOTE — Addendum Note (Signed)
Addended by: Dianne Dun on: 06/02/2014 09:52 AM   Modules accepted: Kipp Brood

## 2014-06-02 NOTE — Assessment & Plan Note (Signed)
Recent URI but exam reassuring. No changes made.

## 2014-06-02 NOTE — Patient Instructions (Signed)
Great to see you! Happy new year!  We will call you with your lab results.  Please pick up your stool cards.

## 2014-06-02 NOTE — Addendum Note (Signed)
Addended by: Dianne Dun on: 06/02/2014 09:58 AM   Modules accepted: Orders, SmartSet

## 2014-06-03 ENCOUNTER — Other Ambulatory Visit: Payer: Self-pay | Admitting: Family Medicine

## 2014-06-03 MED ORDER — SITAGLIPTIN PHOSPHATE 50 MG PO TABS: 50.0000 mg | ORAL_TABLET | Freq: Every day | ORAL | Status: DC

## 2014-06-11 ENCOUNTER — Telehealth: Payer: Self-pay

## 2014-06-11 NOTE — Telephone Encounter (Signed)
Pt left v/m requesting cb about status of Januvia prior auth; spoke with [facility] [place] and was told PA request sent last week but would refax request today.

## 2014-06-12 NOTE — Telephone Encounter (Signed)
Spoke to pt and informed her that PA form has been completed and faxed to her insurance co. Advised pt we will contact her with approval/denial.

## 2014-06-18 NOTE — Telephone Encounter (Signed)
Spoke to pt and informed her Januvia was approved through 05/29/2038. Faxed to pharmacy

## 2014-06-25 ENCOUNTER — Telehealth: Payer: Self-pay | Admitting: Family Medicine

## 2014-06-25 NOTE — Telephone Encounter (Signed)
Pts drug store has closed and pt is pt have chosen [facility] as primary pharmacy.  [facility]  Phone number is [phone].  Pt would also like to have 90 day supply of medication so that she doesn't have to go out as much.  best number for pt is [phone].

## 2014-06-25 NOTE — Telephone Encounter (Signed)
Lm on pts vm requesting a call back. Spoke with pharmacy. Although store is closing, the pharmacy will remain open until 02/14.

## 2014-06-26 NOTE — Telephone Encounter (Signed)
Spoke to pt who states that the pharmacy has already transferred all medications from [facility] to [facility].  

## 2014-07-22 ENCOUNTER — Telehealth: Payer: Self-pay | Admitting: Pulmonary Disease

## 2014-07-22 ENCOUNTER — Other Ambulatory Visit: Payer: Self-pay

## 2014-07-22 MED ORDER — MOMETASONE FURO-FORMOTEROL FUM 100-5 MCG/ACT IN AERO: 2.0000 | INHALATION_SPRAY | Freq: Two times a day (BID) | RESPIRATORY_TRACT | Status: DC

## 2014-07-22 NOTE — Telephone Encounter (Signed)
[facility] is pts new pharmacy and request refill on Dulera; Dr Shelle Ironlance has refilled in the past and [facility].

## 2014-07-22 NOTE — Telephone Encounter (Signed)
Rx has been sent in. Nothing further was needed. 

## 2014-07-28 ENCOUNTER — Other Ambulatory Visit: Payer: Self-pay | Admitting: Family Medicine

## 2014-08-12 ENCOUNTER — Other Ambulatory Visit: Payer: Self-pay | Admitting: *Deleted

## 2014-08-12 ENCOUNTER — Other Ambulatory Visit: Payer: Self-pay | Admitting: Family Medicine

## 2014-08-12 MED ORDER — ALPRAZOLAM 0.5 MG PO TABS: ORAL_TABLET | ORAL | Status: DC

## 2014-08-12 NOTE — Telephone Encounter (Signed)
Last f/u appt 05/2014-CPE 

## 2014-08-13 NOTE — Telephone Encounter (Signed)
Rx called in to requested pharmacy 

## 2014-08-20 DIAGNOSIS — E119 Type 2 diabetes mellitus without complications: Secondary | ICD-10-CM | POA: Diagnosis not present

## 2014-08-20 DIAGNOSIS — H3531 Nonexudative age-related macular degeneration: Secondary | ICD-10-CM | POA: Diagnosis not present

## 2014-08-20 DIAGNOSIS — H524 Presbyopia: Secondary | ICD-10-CM | POA: Diagnosis not present

## 2014-08-26 ENCOUNTER — Other Ambulatory Visit: Payer: Self-pay | Admitting: Family Medicine

## 2014-08-26 DIAGNOSIS — E119 Type 2 diabetes mellitus without complications: Secondary | ICD-10-CM

## 2014-08-29 ENCOUNTER — Other Ambulatory Visit (INDEPENDENT_AMBULATORY_CARE_PROVIDER_SITE_OTHER): Payer: BLUE CROSS/BLUE SHIELD

## 2014-08-29 DIAGNOSIS — E119 Type 2 diabetes mellitus without complications: Secondary | ICD-10-CM

## 2014-08-29 LAB — HEMOGLOBIN A1C: HEMOGLOBIN A1C: 6.9 % — AB (ref 4.6–6.5)

## 2014-09-26 ENCOUNTER — Other Ambulatory Visit: Payer: Self-pay | Admitting: Family Medicine

## 2014-09-29 ENCOUNTER — Other Ambulatory Visit: Payer: Self-pay | Admitting: Family Medicine

## 2014-10-13 ENCOUNTER — Other Ambulatory Visit: Payer: Self-pay | Admitting: Family Medicine

## 2014-10-13 MED ORDER — SITAGLIPTIN PHOSPHATE 50 MG PO TABS: 50.0000 mg | ORAL_TABLET | Freq: Every day | ORAL | Status: DC

## 2014-10-13 MED ORDER — ALPRAZOLAM 0.5 MG PO TABS: ORAL_TABLET | ORAL | Status: DC

## 2014-10-13 NOTE — Telephone Encounter (Signed)
Patient left a voicemail stating that she had requested a refill on her Xanax Friday and it has not yet been called in. Last refill 08/12/14 #30/1.

## 2014-10-13 NOTE — Telephone Encounter (Signed)
Rx called in to requested pharmacy 

## 2014-10-13 NOTE — Addendum Note (Signed)
Addended by: Desmond DikeKNIGHT, Novalee Horsfall H on: 10/13/2014 11:52 AM   Modules accepted: Orders

## 2014-10-14 DIAGNOSIS — M79676 Pain in unspecified toe(s): Secondary | ICD-10-CM | POA: Diagnosis not present

## 2014-10-14 DIAGNOSIS — M2040 Other hammer toe(s) (acquired), unspecified foot: Secondary | ICD-10-CM | POA: Diagnosis not present

## 2014-10-14 DIAGNOSIS — E1165 Type 2 diabetes mellitus with hyperglycemia: Secondary | ICD-10-CM | POA: Diagnosis not present

## 2014-10-20 DIAGNOSIS — J069 Acute upper respiratory infection, unspecified: Secondary | ICD-10-CM | POA: Diagnosis not present

## 2014-10-20 DIAGNOSIS — J449 Chronic obstructive pulmonary disease, unspecified: Secondary | ICD-10-CM | POA: Diagnosis not present

## 2014-10-28 ENCOUNTER — Other Ambulatory Visit: Payer: Self-pay | Admitting: Family Medicine

## 2014-10-29 DIAGNOSIS — H43813 Vitreous degeneration, bilateral: Secondary | ICD-10-CM | POA: Diagnosis not present

## 2014-10-29 DIAGNOSIS — H3531 Nonexudative age-related macular degeneration: Secondary | ICD-10-CM | POA: Diagnosis not present

## 2014-11-04 ENCOUNTER — Ambulatory Visit (INDEPENDENT_AMBULATORY_CARE_PROVIDER_SITE_OTHER): Payer: BLUE CROSS/BLUE SHIELD | Admitting: Family Medicine

## 2014-11-04 ENCOUNTER — Encounter: Payer: Self-pay | Admitting: Family Medicine

## 2014-11-04 ENCOUNTER — Ambulatory Visit (INDEPENDENT_AMBULATORY_CARE_PROVIDER_SITE_OTHER)
Admission: RE | Admit: 2014-11-04 | Discharge: 2014-11-04 | Disposition: A | Discharge status: Home / Self Care | Payer: BLUE CROSS/BLUE SHIELD | Source: Ambulatory Visit | Attending: Family Medicine | Admitting: Family Medicine

## 2014-11-04 VITALS — BP 120/68 | HR 82 | Temp 97.7°F | Wt 142.8 lb

## 2014-11-04 DIAGNOSIS — M25561 Pain in right knee: Secondary | ICD-10-CM

## 2014-11-04 DIAGNOSIS — M7989 Other specified soft tissue disorders: Secondary | ICD-10-CM | POA: Diagnosis not present

## 2014-11-04 MED ORDER — DEXAMETHASONE SODIUM PHOSPHATE 10 MG/ML IJ SOLN
10.0000 mg | Freq: Once | INTRAMUSCULAR | Status: AC
  Administered 2014-11-04: 10 mg via INTRAMUSCULAR

## 2014-11-04 NOTE — Progress Notes (Signed)
Pre visit review using our clinic review tool, if applicable. No additional management support is needed unless otherwise documented below in the visit note. 

## 2014-11-04 NOTE — Progress Notes (Signed)
SUBJECTIVE: Sara Stafford is a 69 y.o. female who complains of right knee pain 2 day(s) ago.68 y.o. female who complains of right knee pain 2 day(s) ago. No known injury.  Symptoms have been constant since that time. Prior history of related problems: no prior problems with this area in the past.   Current Outpatient Prescriptions on File Prior to Visit  Medication Sig Dispense Refill  . ALPRAZolam (XANAX) 0.5 MG tablet TAKE ONE-HALF TABLET BY MOUTH TWICE DAILY 30 tablet 1  . aspirin 81 MG tablet Take 81 mg by mouth as needed.      . cetirizine (ZYRTEC) 10 MG tablet TAKE ONE TABLET BY MOUTH ONCE DAILY 90 tablet 2  . fluticasone (FLONASE) 50 MCG/ACT nasal spray Place 1 spray into the nose daily. 48 g 1  . glipiZIDE (GLUCOTROL XL) 10 MG 24 hr tablet TAKE (2) TABLETS DAILY. 60 tablet 5  . metFORMIN (GLUCOPHAGE) 1000 MG tablet TAKE 1 TABLET TWICE DAILY WITH MEALS 60 tablet 5  . mometasone-formoterol (DULERA) 100-5 MCG/ACT AERO Inhale 2 puffs into the lungs 2 (two) times daily. 1 Inhaler 5  . montelukast (SINGULAIR) 10 MG tablet Take 1 tablet (10 mg total) by mouth at bedtime. 90 tablet 1  . omeprazole (PRILOSEC) 20 MG capsule TAKE (1) CAPSULE DAILY AS NEEDED. 30 capsule 10  . simvastatin (ZOCOR) 80 MG tablet Take 0.5 tablets (40 mg total) by mouth at bedtime. 45 tablet 1  . sitaGLIPtin (JANUVIA) 50 MG tablet Take 1 tablet (50 mg total) by mouth daily. 30 tablet 4  . venlafaxine (EFFEXOR) 37.5 MG tablet TAKE (1/2) TABLET TWICE DAILY 30 tablet 3  . VENTOLIN HFA 108 (90 BASE) MCG/ACT inhaler INHALE 2 PUFFS EVERY 4 HOURS AS NEEDED. 18 g 5  . [DISCONTINUED] omeprazole (PRILOSEC OTC) 20 MG tablet Take 1 tablet (20 mg total) by mouth daily as needed. 90 tablet 1   No current facility-administered medications on file prior to visit.    Allergies  Allergen Reactions  . Doxycycline     REACTION: thrush    Past Medical History  Diagnosis Date  . COPD (chronic obstructive pulmonary disease)     no change with spiriva, poor tolerance of advair  .  Routine general medical examination at a health care facility   . GERD (gastroesophageal reflux disease)   . Hyperlipidemia   . Fibromyalgia   . Fatigue   . Depressive disorder, not elsewhere classified   . Diabetes mellitus     Past Surgical History  Procedure Laterality Date  . Abdominal hysterectomy      partial    Family History  Problem Relation Age of Onset  . Stroke Father 8180  . Diabetes Father   . Asthma Father   . Liver cancer Sister 6981  . Pancreatic cancer Sister 6972  . Brain cancer Brother   . Diabetes Sister   . Diabetes Sister   . Diabetes Sister   . Diabetes Sister   . Diabetes Sister   . Diabetes Sister   . Diabetes Sister   . Diabetes Paternal Grandmother     History   Social History  . Marital Status: Married    Spouse Name: N/A  . Number of Children: 2  . Years of Education: N/A   Occupational History  . retired     [employer]   Social History Main Topics  . Smoking status: Former Smoker -- 1.00 packs/day for 50 years    Quit date: 06/06/2010  . Smokeless tobacco: Not on file  . Alcohol Use: Not on  file  . Drug Use: Not on file  . Sexual Activity: Not on file   Other Topics Concern  . Not on file   Social History Narrative   Children aged 60, and 49, expecting 2nd grandchild   The PMH, PSH, Social History, Family History, Medications, and allergies have been reviewed in [facility], and have been updated if relevant.  OBJECTIVE: BP 120/68 mmHg  Pulse 82  Temp(Src) 97.7 F (36.5 C) (Oral)  Wt 142 lb 12 oz (64.751 kg)  SpO2 95%  Vital signs as noted above. Appearance: alert, well appearing, and in no distress. Knee exam: exam limited by acuity of pain, negative drawer sign, collateral ligaments intact, positive McMurray sign, negative Apley's sign, negative Lachman sign, normal ipsilateral hip exam, normal ipsilateral foot and ankle exam, normal contralateral knee exam. X-ray: ordered, but results not yet available.  ASSESSMENT: Knee  underlying chronic DJD likely  PLAN: rest the injured area as much as practical, X-Ray ordered See orders for this visit as documented in the electronic medical record. IM decadron given in office to decrease inflammation.

## 2014-11-04 NOTE — Addendum Note (Signed)
Addended by: Desmond DikeKNIGHT, Malyk Girouard H on: 11/04/2014 01:40 PM   Modules accepted: Orders

## 2014-11-04 NOTE — Patient Instructions (Signed)
Good to see you. We will call you with your xray results.  Please make an appointment with Dr. Patsy Lageropland on your way out.

## 2014-11-06 ENCOUNTER — Encounter: Payer: Self-pay | Admitting: Family Medicine

## 2014-11-06 ENCOUNTER — Ambulatory Visit: Payer: Medicare Other | Admitting: Family Medicine

## 2014-11-06 ENCOUNTER — Ambulatory Visit (INDEPENDENT_AMBULATORY_CARE_PROVIDER_SITE_OTHER): Payer: BLUE CROSS/BLUE SHIELD | Admitting: Family Medicine

## 2014-11-06 VITALS — BP 130/70 | HR 89 | Temp 97.8°F | Ht 64.75 in | Wt 141.5 lb

## 2014-11-06 DIAGNOSIS — M25561 Pain in right knee: Secondary | ICD-10-CM | POA: Diagnosis not present

## 2014-11-06 DIAGNOSIS — M2391 Unspecified internal derangement of right knee: Secondary | ICD-10-CM

## 2014-11-06 MED ORDER — DICLOFENAC SODIUM 75 MG PO TBEC: 75.0000 mg | DELAYED_RELEASE_TABLET | Freq: Two times a day (BID) | ORAL | Status: DC

## 2014-11-06 MED ORDER — METHYLPREDNISOLONE ACETATE 40 MG/ML IJ SUSP
80.0000 mg | Freq: Once | INTRAMUSCULAR | Status: AC
  Administered 2014-11-06: 80 mg via INTRA_ARTICULAR

## 2014-11-06 NOTE — Progress Notes (Signed)
Dr. Karleen Hampshire T. Conni Knighton, MD, CAQ Sports Medicine Primary Care and Sports Medicine [address] [place], [address] Phone: [phone] Fax: [phone]  11/06/2014  Patient: Sara Stafford, MRN: [id number], DOB: [birth year], 69 y.o.  Primary Physician:  Ruthe Mannan, MD  Chief Complaint: Knee Pain  Subjective:   Sara Stafford is a 69 y.o. very pleasant female patient who presents with the following:  This 69 y.o. female patient presents with 7 day day h/o R lateral sided knee pain after injuring it Thurs or Fri of last week. No audible pop was heard. The patient has not had an effusion. No symptomatic giving-way. No mechanical clicking. Joint has not locked up. Patient has been able to walk but is limping. The patient does have pain going up and down stairs or rising from a seated position.   Pain location: lateral Current physical activity: minimal  Has been up since 2 AM.  R knee.  No specific knee injury? - but now says Thurs or Fri of last.   Past Medical History, Surgical History, Social History, Family History, Problem List, Medications, and Allergies have been reviewed and updated if relevant.  Patient Active Problem List   Diagnosis Date Noted  . Medicare annual wellness visit, subsequent 06/02/2014  . Macular degeneration 06/02/2014  . MAMMOGRAM, ABNORMAL, LEFT 09/10/2009  . Pulmonary nodules 03/20/2009  . ANEMIAS DUE TO DISORDERS GLUTATHIONE METABOLISM 07/31/2008  . COPD (chronic obstructive pulmonary disease) with emphysema 10/11/2007  . Diabetes 06/04/2007  . HYPERLIPIDEMIA 06/04/2007  . Depression 06/04/2007  . GERD 06/04/2007  . FIBROMYALGIA 06/04/2007    Past Medical History  Diagnosis Date  . COPD (chronic obstructive pulmonary disease)     no change with spiriva, poor tolerance of advair  . Routine general medical examination at a health care facility   . GERD (gastroesophageal reflux disease)   . Hyperlipidemia   . Fibromyalgia   .  Fatigue   . Depressive disorder, not elsewhere classified   . Diabetes mellitus     Past Surgical History  Procedure Laterality Date  . Abdominal hysterectomy      partial    History   Social History  . Marital Status: Married    Spouse Name: N/A  . Number of Children: 2  . Years of Education: N/A   Occupational History  . retired     [employer]   Social History Main Topics  . Smoking status: Former Smoker -- 1.00 packs/day for 50 years    Quit date: 06/06/2010  . Smokeless tobacco: Never Used  . Alcohol Use: No  . Drug Use: No  . Sexual Activity: Not on file   Other Topics Concern  . Not on file   Social History Narrative   Children aged 56, and 73, expecting 2nd grandchild    Family History  Problem Relation Age of Onset  . Stroke Father 65  . Diabetes Father   . Asthma Father   . Liver cancer Sister 94  . Pancreatic cancer Sister 74  . Brain cancer Brother   . Diabetes Sister   . Diabetes Sister   . Diabetes Sister   . Diabetes Sister   . Diabetes Sister   . Diabetes Sister   . Diabetes Sister   . Diabetes Paternal Grandmother     Allergies  Allergen Reactions  . Doxycycline     REACTION: thrush    Medication list reviewed and updated in full in [place] [facility].  GEN:  No fevers, chills. Nontoxic. Primarily MSK c/o today. MSK: Detailed in the HPI GI: tolerating PO intake without difficulty Neuro: No numbness, parasthesias, or tingling associated. Otherwise the pertinent positives of the ROS are noted above.   Objective:   BP 130/70 mmHg  Pulse 89  Temp(Src) 97.8 F (36.6 C) (Oral)  Ht 5' 4.75" (1.645 m)  Wt 141 lb 8 oz (64.184 kg)  BMI 23.72 kg/m2   GEN: WDWN, NAD, Non-toxic, Alert & Oriented x 3 HEENT: Atraumatic, Normocephalic.  Ears and Nose: No external deformity. EXTR: No clubbing/cyanosis/edema NEURO: Normal gait.  PSYCH: Normally interactive. Conversant. Not depressed or anxious appearing.  Calm demeanor.   Knee:   R Gait: Normal heel toe pattern ROM: 0-120 Effusion: neg Echymosis or edema: none Patellar tendon NT Painful PLICA: neg Patellar grind: negative Medial and lateral patellar facet loading: negative medial and lateral joint lines: lateral joint line tenderness Mcmurray's neg Flexion-pinch pain Varus and valgus stress: stable Lachman: neg Ant and Post drawer: neg Hip abduction, IR, ER: WNL Hip flexion str: 5/5 Hip abd: 5/5 Quad: 5/5 VMO atrophy:No Hamstring concentric and eccentric: 5/5   Radiology: Dg Knee Complete 4 Views Right  11/04/2014   CLINICAL DATA:  Constant right knee pain medially for 2 days, swelling, no trauma  EXAM: RIGHT KNEE - COMPLETE 4+ VIEW  COMPARISON:  None.  FINDINGS: The knee joint spaces are relatively well preserved for age. No fracture is seen. No joint effusion is noted. The patella appears normally positioned.  IMPRESSION: Negative.   Electronically Signed   By: Dwyane Dee M.D.   On: 11/04/2014 16:36    Assessment and Plan:   Right knee pain - Plan: methylPREDNISolone acetate (DEPO-MEDROL) injection 80 mg  Internal derangement of right knee   good range of motion. Minimal osteoarthritis on radiographs. More likely internal derangement, most likely lateral meniscal tear based on examination.  Trial of conservative management. Time, ice, oral anti-inflammatory's.  Knee Injection, R Patient verbally consented to procedure. Risks (including potential rare risk of infection), benefits, and alternatives explained. Sterilely prepped with Chloraprep. Ethyl cholride used for anesthesia. 8 cc Lidocaine 1% mixed with Depo-Medrol 80 mg injected using the anteromedial approach without difficulty. No complications with procedure and tolerated well. Patient had decreased pain post-injection.   Follow-up: Return in about 1 month (around 12/06/2014).  New Prescriptions   DICLOFENAC (VOLTAREN) 75 MG EC TABLET    Take 1 tablet (75 mg total) by mouth 2 (two) times  daily.   No orders of the defined types were placed in this encounter.    Signed,  Elpidio Galea. Meghann Landing, MD   Patient's Medications  New Prescriptions   DICLOFENAC (VOLTAREN) 75 MG EC TABLET    Take 1 tablet (75 mg total) by mouth 2 (two) times daily.  Previous Medications   ALPRAZOLAM (XANAX) 0.5 MG TABLET    TAKE ONE-HALF TABLET BY MOUTH TWICE DAILY   ASPIRIN 81 MG TABLET    Take 81 mg by mouth as needed.     CETIRIZINE (ZYRTEC) 10 MG TABLET    TAKE ONE TABLET BY MOUTH ONCE DAILY   FLUTICASONE (FLONASE) 50 MCG/ACT NASAL SPRAY    Place 1 spray into the nose daily.   GLIPIZIDE (GLUCOTROL XL) 10 MG 24 HR TABLET    TAKE (2) TABLETS DAILY.   METFORMIN (GLUCOPHAGE) 1000 MG TABLET    TAKE 1 TABLET TWICE DAILY WITH MEALS   MOMETASONE-FORMOTEROL (DULERA) 100-5 MCG/ACT AERO    Inhale 2 puffs into  the lungs 2 (two) times daily.   MONTELUKAST (SINGULAIR) 10 MG TABLET    Take 1 tablet (10 mg total) by mouth at bedtime.   OMEPRAZOLE (PRILOSEC) 20 MG CAPSULE    TAKE (1) CAPSULE DAILY AS NEEDED.   SIMVASTATIN (ZOCOR) 80 MG TABLET    Take 0.5 tablets (40 mg total) by mouth at bedtime.   SITAGLIPTIN (JANUVIA) 50 MG TABLET    Take 1 tablet (50 mg total) by mouth daily.   VENLAFAXINE (EFFEXOR) 37.5 MG TABLET    TAKE (1/2) TABLET TWICE DAILY   VENTOLIN HFA 108 (90 BASE) MCG/ACT INHALER    INHALE 2 PUFFS EVERY 4 HOURS AS NEEDED.  Modified Medications   No medications on file  Discontinued Medications   No medications on file

## 2014-11-06 NOTE — Progress Notes (Signed)
Pre visit review using our clinic review tool, if applicable. No additional management support is needed unless otherwise documented below in the visit note. 

## 2014-11-11 ENCOUNTER — Other Ambulatory Visit: Payer: Self-pay | Admitting: Family Medicine

## 2014-12-03 ENCOUNTER — Encounter: Payer: Self-pay | Admitting: Family Medicine

## 2014-12-03 ENCOUNTER — Ambulatory Visit (INDEPENDENT_AMBULATORY_CARE_PROVIDER_SITE_OTHER): Payer: BLUE CROSS/BLUE SHIELD | Admitting: Family Medicine

## 2014-12-03 VITALS — BP 118/62 | HR 96 | Temp 97.5°F | Ht 64.75 in | Wt 142.2 lb

## 2014-12-03 DIAGNOSIS — M25561 Pain in right knee: Secondary | ICD-10-CM | POA: Diagnosis not present

## 2014-12-03 NOTE — Progress Notes (Signed)
Pre visit review using our clinic review tool, if applicable. No additional management support is needed unless otherwise documented below in the visit note. 

## 2014-12-03 NOTE — Progress Notes (Signed)
Dr. Karleen Hampshire T. Kyira Volkert, MD, CAQ Sports Medicine Primary Care and Sports Medicine [address] [place], [address] Phone: [phone] Fax: [phone]  12/03/2014  Patient: Sara Stafford, MRN: [id number], DOB: 17-May-1946, 69 y.o.  Primary Physician:  Ruthe Mannan, MD  Chief Complaint: Follow-up  Subjective:   Sara Stafford is a 69 y.o. very pleasant female patient who presents with the following:  Right knee is much better. She is having an occasional twinge, but generally it is dramatically improved compared to her last office visit. She has no complaints really and feels well.  11/06/2014 Last OV with Hannah Beat, MD  This 70 y.o. female patient presents with 7 day day h/o R lateral sided knee pain after injuring it Thurs or Fri of last week. No audible pop was heard. The patient has not had an effusion. No symptomatic giving-way. No mechanical clicking. Joint has not locked up. Patient has been able to walk but is limping. The patient does have pain going up and down stairs or rising from a seated position.   Pain location: lateral Current physical activity: minimal  Has been up since 2 AM.  R knee.  No specific knee injury? - but now says Thurs or Fri of last.   Past Medical History, Surgical History, Social History, Family History, Problem List, Medications, and Allergies have been reviewed and updated if relevant.  Patient Active Problem List   Diagnosis Date Noted  . Medicare annual wellness visit, subsequent 06/02/2014  . Macular degeneration 06/02/2014  . MAMMOGRAM, ABNORMAL, LEFT 09/10/2009  . Pulmonary nodules 03/20/2009  . ANEMIAS DUE TO DISORDERS GLUTATHIONE METABOLISM 07/31/2008  . COPD (chronic obstructive pulmonary disease) with emphysema 10/11/2007  . Diabetes 06/04/2007  . HYPERLIPIDEMIA 06/04/2007  . Depression 06/04/2007  . GERD 06/04/2007  . FIBROMYALGIA 06/04/2007    Past Medical History  Diagnosis Date  . COPD (chronic  obstructive pulmonary disease)     no change with spiriva, poor tolerance of advair  . Routine general medical examination at a health care facility   . GERD (gastroesophageal reflux disease)   . Hyperlipidemia   . Fibromyalgia   . Fatigue   . Depressive disorder, not elsewhere classified   . Diabetes mellitus     Past Surgical History  Procedure Laterality Date  . Abdominal hysterectomy      partial    History   Social History  . Marital Status: Married    Spouse Name: N/A  . Number of Children: 2  . Years of Education: N/A   Occupational History  . retired     [employer]   Social History Main Topics  . Smoking status: Former Smoker -- 1.00 packs/day for 50 years    Quit date: 06/06/2010  . Smokeless tobacco: Never Used  . Alcohol Use: No  . Drug Use: No  . Sexual Activity: Not on file   Other Topics Concern  . Not on file   Social History Narrative   Children aged 82, and 74, expecting 2nd grandchild    Family History  Problem Relation Age of Onset  . Stroke Father 45  . Diabetes Father   . Asthma Father   . Liver cancer Sister 56  . Pancreatic cancer Sister 56  . Brain cancer Brother   . Diabetes Sister   . Diabetes Sister   . Diabetes Sister   . Diabetes Sister   . Diabetes Sister   . Diabetes Sister   . Diabetes Sister   .  Diabetes Paternal Grandmother     Allergies  Allergen Reactions  . Doxycycline     REACTION: thrush    Medication list reviewed and updated in full in [facility].  GEN: No fevers, chills. Nontoxic. Primarily MSK c/o today. MSK: Detailed in the HPI GI: tolerating PO intake without difficulty Neuro: No numbness, parasthesias, or tingling associated. Otherwise the pertinent positives of the ROS are noted above.   Objective:   BP 118/62 mmHg  Pulse 96  Temp(Src) 97.5 F (36.4 C) (Oral)  Ht 5' 4.75" (1.645 m)  Wt 142 lb 4 oz (64.524 kg)  BMI 23.84 kg/m2   GEN: WDWN, NAD, Non-toxic, Alert & Oriented x  3 HEENT: Atraumatic, Normocephalic.  Ears and Nose: No external deformity. EXTR: No clubbing/cyanosis/edema NEURO: Normal gait.  PSYCH: Normally interactive. Conversant. Not depressed or anxious appearing.  Calm demeanor.   Knee:  R Gait: Normal heel toe pattern ROM: 0-130 Effusion: neg Echymosis or edema: none Patellar tendon NT Painful PLICA: neg Patellar grind: negative Medial and lateral patellar facet loading: negative medial and lateral joint lines: neg Mcmurray's neg Flexion-pinch NT Varus and valgus stress: stable Lachman: neg Ant and Post drawer: neg Hip abduction, IR, ER: WNL Hip flexion str: 5/5 Hip abd: 5/5 Quad: 5/5 VMO atrophy:No Hamstring concentric and eccentric: 5/5   Radiology: Dg Knee Complete 4 Views Right  11/04/2014   CLINICAL DATA:  Constant right knee pain medially for 2 days, swelling, no trauma  EXAM: RIGHT KNEE - COMPLETE 4+ VIEW  COMPARISON:  None.  FINDINGS: The knee joint spaces are relatively well preserved for age. No fracture is seen. No joint effusion is noted. The patella appears normally positioned.  IMPRESSION: Negative.   Electronically Signed   By: Dwyane DeePaul  Barry M.D.   On: 11/04/2014 16:36    Assessment and Plan:   Right knee pain  Doing great  Follow-up: prn  Signed,  Corinthia Helmers T. Narjis Mira, MD   Patient's Medications  New Prescriptions   No medications on file  Previous Medications   ALPRAZOLAM (XANAX) 0.5 MG TABLET    TAKE ONE-HALF TABLET BY MOUTH TWICE DAILY   ASPIRIN 81 MG TABLET    Take 81 mg by mouth as needed.     CETIRIZINE (ZYRTEC) 10 MG TABLET    TAKE ONE TABLET BY MOUTH ONCE DAILY   DICLOFENAC (VOLTAREN) 75 MG EC TABLET    Take 1 tablet (75 mg total) by mouth 2 (two) times daily.   FLUTICASONE (FLONASE) 50 MCG/ACT NASAL SPRAY    Place 1 spray into the nose daily.   GLIPIZIDE (GLUCOTROL XL) 10 MG 24 HR TABLET    TAKE (2) TABLETS DAILY.   METFORMIN (GLUCOPHAGE) 1000 MG TABLET    TAKE 1 TABLET TWICE DAILY WITH MEALS    MOMETASONE-FORMOTEROL (DULERA) 100-5 MCG/ACT AERO    Inhale 2 puffs into the lungs 2 (two) times daily.   MONTELUKAST (SINGULAIR) 10 MG TABLET    Take 1 tablet (10 mg total) by mouth at bedtime.   OMEPRAZOLE (PRILOSEC) 20 MG CAPSULE    TAKE (1) CAPSULE DAILY AS NEEDED.   SIMVASTATIN (ZOCOR) 80 MG TABLET    TAKE 1/2 TABLET AT BEDTIME.   SITAGLIPTIN (JANUVIA) 50 MG TABLET    Take 1 tablet (50 mg total) by mouth daily.   VENLAFAXINE (EFFEXOR) 37.5 MG TABLET    TAKE (1/2) TABLET TWICE DAILY   VENTOLIN HFA 108 (90 BASE) MCG/ACT INHALER    INHALE 2 PUFFS EVERY 4  HOURS AS NEEDED.  Modified Medications   No medications on file  Discontinued Medications   No medications on file

## 2014-12-09 ENCOUNTER — Other Ambulatory Visit: Payer: Self-pay | Admitting: Family Medicine

## 2014-12-09 MED ORDER — ALPRAZOLAM 0.5 MG PO TABS: ORAL_TABLET | ORAL | Status: DC

## 2014-12-12 ENCOUNTER — Other Ambulatory Visit: Payer: Self-pay | Admitting: Family Medicine

## 2014-12-12 ENCOUNTER — Telehealth: Payer: Self-pay | Admitting: Pulmonary Disease

## 2014-12-12 NOTE — Telephone Encounter (Signed)
Spoke with pt. She needs samples of Dulera and Albuterol. Advised her that we have Dulera but we do not have any albuterol. Elwin SleightDulera will be left at the front desk. Nothing further was needed.

## 2014-12-18 ENCOUNTER — Other Ambulatory Visit: Payer: Self-pay | Admitting: Family Medicine

## 2014-12-23 ENCOUNTER — Other Ambulatory Visit: Payer: Self-pay | Admitting: Family Medicine

## 2015-01-09 DIAGNOSIS — E119 Type 2 diabetes mellitus without complications: Secondary | ICD-10-CM | POA: Diagnosis not present

## 2015-01-09 DIAGNOSIS — H18411 Arcus senilis, right eye: Secondary | ICD-10-CM | POA: Diagnosis not present

## 2015-01-09 DIAGNOSIS — H02839 Dermatochalasis of unspecified eye, unspecified eyelid: Secondary | ICD-10-CM | POA: Diagnosis not present

## 2015-01-09 DIAGNOSIS — H2511 Age-related nuclear cataract, right eye: Secondary | ICD-10-CM | POA: Diagnosis not present

## 2015-01-09 DIAGNOSIS — H3531 Nonexudative age-related macular degeneration: Secondary | ICD-10-CM | POA: Diagnosis not present

## 2015-01-09 DIAGNOSIS — H18412 Arcus senilis, left eye: Secondary | ICD-10-CM | POA: Diagnosis not present

## 2015-01-19 ENCOUNTER — Other Ambulatory Visit: Payer: Self-pay | Admitting: Family Medicine

## 2015-01-26 DIAGNOSIS — H25811 Combined forms of age-related cataract, right eye: Secondary | ICD-10-CM | POA: Diagnosis not present

## 2015-01-26 DIAGNOSIS — H5212 Myopia, left eye: Secondary | ICD-10-CM | POA: Diagnosis not present

## 2015-01-26 DIAGNOSIS — H2511 Age-related nuclear cataract, right eye: Secondary | ICD-10-CM | POA: Diagnosis not present

## 2015-01-26 HISTORY — PX: CATARACT EXTRACTION: SUR2

## 2015-01-27 DIAGNOSIS — H25042 Posterior subcapsular polar age-related cataract, left eye: Secondary | ICD-10-CM | POA: Diagnosis not present

## 2015-01-27 DIAGNOSIS — H2512 Age-related nuclear cataract, left eye: Secondary | ICD-10-CM | POA: Diagnosis not present

## 2015-01-27 DIAGNOSIS — H25012 Cortical age-related cataract, left eye: Secondary | ICD-10-CM | POA: Diagnosis not present

## 2015-02-05 ENCOUNTER — Other Ambulatory Visit: Payer: Self-pay | Admitting: Family Medicine

## 2015-02-06 DIAGNOSIS — M7741 Metatarsalgia, right foot: Secondary | ICD-10-CM | POA: Diagnosis not present

## 2015-02-06 DIAGNOSIS — M79671 Pain in right foot: Secondary | ICD-10-CM | POA: Diagnosis not present

## 2015-02-09 DIAGNOSIS — H5212 Myopia, left eye: Secondary | ICD-10-CM | POA: Diagnosis not present

## 2015-02-09 DIAGNOSIS — H2512 Age-related nuclear cataract, left eye: Secondary | ICD-10-CM | POA: Diagnosis not present

## 2015-02-09 DIAGNOSIS — H2513 Age-related nuclear cataract, bilateral: Secondary | ICD-10-CM | POA: Diagnosis not present

## 2015-02-09 DIAGNOSIS — H25812 Combined forms of age-related cataract, left eye: Secondary | ICD-10-CM | POA: Diagnosis not present

## 2015-02-09 HISTORY — PX: CATARACT EXTRACTION: SUR2

## 2015-02-17 ENCOUNTER — Other Ambulatory Visit: Payer: Self-pay | Admitting: Family Medicine

## 2015-03-01 DIAGNOSIS — S838X1A Sprain of other specified parts of right knee, initial encounter: Secondary | ICD-10-CM | POA: Diagnosis not present

## 2015-03-02 ENCOUNTER — Telehealth: Payer: Self-pay

## 2015-03-02 ENCOUNTER — Ambulatory Visit (INDEPENDENT_AMBULATORY_CARE_PROVIDER_SITE_OTHER): Payer: BLUE CROSS/BLUE SHIELD | Admitting: Family Medicine

## 2015-03-02 ENCOUNTER — Encounter: Payer: Self-pay | Admitting: Family Medicine

## 2015-03-02 ENCOUNTER — Ambulatory Visit (INDEPENDENT_AMBULATORY_CARE_PROVIDER_SITE_OTHER)
Admission: RE | Admit: 2015-03-02 | Discharge: 2015-03-02 | Disposition: A | Discharge status: Home / Self Care | Payer: BLUE CROSS/BLUE SHIELD | Source: Ambulatory Visit | Attending: Family Medicine | Admitting: Family Medicine

## 2015-03-02 VITALS — BP 116/62 | HR 94 | Temp 98.0°F | Wt 142.5 lb

## 2015-03-02 DIAGNOSIS — M25561 Pain in right knee: Secondary | ICD-10-CM | POA: Diagnosis not present

## 2015-03-02 DIAGNOSIS — Z23 Encounter for immunization: Secondary | ICD-10-CM | POA: Diagnosis not present

## 2015-03-02 MED ORDER — DICLOFENAC SODIUM 1 % TD GEL: 4.0000 g | Freq: Four times a day (QID) | TRANSDERMAL | Status: DC

## 2015-03-02 NOTE — Telephone Encounter (Signed)
PLEASE NOTE: All timestamps contained within this report are represented as [place] Standard Time. CONFIDENTIALTY NOTICE: This fax transmission is intended only for the addressee. It contains information that is legally privileged, confidential or otherwise protected from use or disclosure. If you are not the intended recipient, you are strictly prohibited from reviewing, disclosing, copying using or disseminating any of this information or taking any action in reliance on or regarding this information. If you have received this fax in error, please notify us immediately by telephone so that we can arrange for its return to [place]. Phone: [phone], Toll-Free: [phone], Fax: [phone] Page: 1 of 2 Call Id: [id number] [facility] Night - Client TELEPHONE ADVICE RECORD [facility] Patient Name: Sara Stafford L Gender: Female DOB: 1945/12/24 Age: 69 Y 3 M 9 D Return Phone Number: [phone] (Primary) Address: City/State/Zip: [place] Client [facility] Night - Client Client Site [facility] - Night Physician [place], [place] Contact Type Call Call Type Triage / Clinical Relationship To Patient Self Return Phone Number [phone] (Primary) Chief Complaint Leg Pain Initial Comment Caller states she is having leg pain below her knee. PreDisposition Go to ED Nurse Assessment Nurse: Joseph Art, RN, Megan Date/Time Lamount Cohen Time): 02/28/2015 9:13:23 AM Confirm and document reason for call. If symptomatic, describe symptoms. ---Caller states she is having pain in her right knee. No pain now. Has the patient traveled out of the country within the last 30 days? ---Not Applicable Does the patient require triage? ---Yes Related visit to physician within the last 2 weeks? ---No Does the PT have any chronic conditions? (i.e. diabetes, asthma, etc.) ---Yes List chronic conditions.  ---copd Guidelines Guideline Title Affirmed Question Affirmed Notes Nurse Date/Time Lamount Cohen Time) Knee Pain Knee pain is a chronic symptom (recurrent or ongoing AND present > 4 weeks) Joseph Art, RN, [facility] 02/28/2015 9:15:35 AM Disp. Time Lamount Cohen Time) Disposition Final User 02/28/2015 8:57:02 AM Attempt made - line busy Joseph Art, RN, Megan 02/28/2015 9:21:16 AM See PCP When Office is Open (within 3 days) Yes Joseph Art, RN, Aundra Millet Disposition Overriden: See PCP within 2 Weeks Override Reason: Patient's symptoms need a higher level of care Caller Understands: Yes Disagree/Comply: Comply PLEASE NOTE: All timestamps contained within this report are represented as [place] Standard Time. CONFIDENTIALTY NOTICE: This fax transmission is intended only for the addressee. It contains information that is legally privileged, confidential or otherwise protected from use or disclosure. If you are not the intended recipient, you are strictly prohibited from reviewing, disclosing, copying using or disseminating any of this information or taking any action in reliance on or regarding this information. If you have received this fax in error, please notify us immediately by telephone so that we can arrange for its return to [place]. Phone: [phone], Toll-Free: [phone], Fax: [phone] Page: 2 of 2 Call Id: [id number] Care Advice Given Per Guideline SEE PCP WITHIN 3 DAYS: * You need to be seen within 2 or 3 days. Call your doctor during regular office hours and make an appointment. An urgent care center is often the best source of care if your doctor's office is closed or you can't get an appointment. NOTE: If office will be open tomorrow, tell caller to call then, not in 3 days. REST YOUR KNEE for the next couple days: * Avoid activities that worsen your pain. * Reduce activities that put a lot of strain on the knee joint (e.g., deep knee bends, stair climbing, running). LOCAL HEAT: Apply a  warm washcloth or  heating pad for 10 minutes three times daily to help increase circulation and improve healing. PAIN MEDICINES: * For pain relief, take acetaminophen, ibuprofen, or naproxen. * Before taking any medicine, read all the instructions on the package. CALL BACK IF: * Fever or severe knee pain occurs * Redness or severe swelling occurs * You become worse. After Care Instructions Given Call Event Type User Date / Time Description

## 2015-03-02 NOTE — Telephone Encounter (Signed)
Pt has appt with Dr Dayton Martes 03/02/15 at 3:15.

## 2015-03-02 NOTE — Progress Notes (Signed)
Pre visit review using our clinic review tool, if applicable. No additional management support is needed unless otherwise documented below in the visit note. 

## 2015-03-02 NOTE — Patient Instructions (Signed)
Good to see you. We will call you with your xray results.  Please make an appointment to see Dr. Patsy Lager on your way out.

## 2015-03-02 NOTE — Progress Notes (Signed)
SUBJECTIVE: Sara Stafford is a 69 y.o. female who sustained a right knee injury 4 day(s) ago. Mechanism of injury: trimming her bushes, putting a lot of weight on her right knee, pivoting on her right knee. Immediate symptoms: immediate pain, inability to bear weight directly after injury. Symptoms have been constant since that time. Prior history of related problems: no prior problems with this area in the past.  No swelling or warmth.  Did not hear anything pop.  Went to [facility]_ per pt, told to take oral voltaren already prescribed to her and to follow up with Dr. Patsy Lager.  Current Outpatient Prescriptions on File Prior to Visit  Medication Sig Dispense Refill  . ALPRAZolam (XANAX) 0.5 MG tablet TAKE ONE-HALF TABLET BY MOUTH TWICE DAILY 60 tablet 1  . aspirin 81 MG tablet Take 81 mg by mouth as needed.      . cetirizine (ZYRTEC) 10 MG tablet TAKE ONE TABLET BY MOUTH ONCE DAILY 90 tablet 2  . diclofenac (VOLTAREN) 75 MG EC tablet Take 1 tablet (75 mg total) by mouth 2 (two) times daily. 60 tablet 3  . fluticasone (FLONASE) 50 MCG/ACT nasal spray Place 1 spray into the nose daily. 48 g 1  . glipiZIDE (GLUCOTROL XL) 10 MG 24 hr tablet TAKE (2) TABLETS DAILY. 60 tablet 5  . metFORMIN (GLUCOPHAGE) 1000 MG tablet TAKE 1 TABLET TWICE DAILY WITH MEALS 60 tablet 5  . mometasone-formoterol (DULERA) 100-5 MCG/ACT AERO Inhale 2 puffs into the lungs 2 (two) times daily. 1 Inhaler 5  . montelukast (SINGULAIR) 10 MG tablet TAKE 1 TABLET AT BEDTIME. 30 tablet 5  . omeprazole (PRILOSEC) 20 MG capsule TAKE (1) CAPSULE DAILY AS NEEDED. 30 capsule 10  . simvastatin (ZOCOR) 80 MG tablet TAKE 1/2 TABLET AT BEDTIME. 15 tablet 3  . sitaGLIPtin (JANUVIA) 50 MG tablet Take 1 tablet (50 mg total) by mouth daily. 30 tablet 4  . venlafaxine (EFFEXOR) 37.5 MG tablet TAKE (1/2) TABLET TWICE DAILY 30 tablet 5  . VENTOLIN HFA 108 (90 BASE) MCG/ACT inhaler INHALE 2 PUFFS EVERY 4 HOURS AS NEEDED. 18 g 5  . [DISCONTINUED]  omeprazole (PRILOSEC OTC) 20 MG tablet Take 1 tablet (20 mg total) by mouth daily as needed. 90 tablet 1   No current facility-administered medications on file prior to visit.    Allergies  Allergen Reactions  . Doxycycline     REACTION: thrush    Past Medical History  Diagnosis Date  . COPD (chronic obstructive pulmonary disease) (HCC)     no change with spiriva, poor tolerance of advair  . Routine general medical examination at a health care facility   . GERD (gastroesophageal reflux disease)   . Hyperlipidemia   . Fibromyalgia   . Fatigue   . Depressive disorder, not elsewhere classified   . Diabetes mellitus     Past Surgical History  Procedure Laterality Date  . Abdominal hysterectomy      partial    Family History  Problem Relation Age of Onset  . Stroke Father 37  . Diabetes Father   . Asthma Father   . Liver cancer Sister 51  . Pancreatic cancer Sister 55  . Brain cancer Brother   . Diabetes Sister   . Diabetes Sister   . Diabetes Sister   . Diabetes Sister   . Diabetes Sister   . Diabetes Sister   . Diabetes Sister   . Diabetes Paternal Grandmother     Social History   Social  History  . Marital Status: Married    Spouse Name: N/A  . Number of Children: 2  . Years of Education: N/A   Occupational History  . retired     [employer]   Social History Main Topics  . Smoking status: Former Smoker -- 1.00 packs/day for 50 years    Quit date: 06/06/2010  . Smokeless tobacco: Never Used  . Alcohol Use: No  . Drug Use: No  . Sexual Activity: Not on file   Other Topics Concern  . Not on file   Social History Narrative   Children aged 54, and 9, expecting 2nd grandchild   The PMH, PSH, Social History, Family History, Medications, and allergies have been reviewed in [facility], and have been updated if relevant.  OBJECTIVE: BP 116/62 mmHg  Pulse 94  Temp(Src) 98 F (36.7 C) (Oral)  Wt 142 lb 8 oz (64.638 kg)  SpO2 98%  Vital signs as noted  above. Appearance: alert, well appearing, and in no distress and oriented to person, place, and time. Knee exam: negative drawer sign, collateral ligaments intact, positive McMurray sign, negative Apley's sign, patellar tenderness, normal ipsilateral hip exam. X-ray: ordered, but results not yet available.  ASSESSMENT: Knee internal derangement  PLAN: rest the injured area as much as practical Voltaren gel, follow up with Dr. Patsy Lager on Wednesday See orders for this visit as documented in the electronic medical record.

## 2015-03-04 ENCOUNTER — Encounter: Payer: Self-pay | Admitting: Family Medicine

## 2015-03-04 ENCOUNTER — Ambulatory Visit (INDEPENDENT_AMBULATORY_CARE_PROVIDER_SITE_OTHER): Payer: BLUE CROSS/BLUE SHIELD | Admitting: Family Medicine

## 2015-03-04 VITALS — BP 140/70 | HR 104 | Temp 98.3°F | Ht 64.75 in | Wt 142.2 lb

## 2015-03-04 DIAGNOSIS — M7150 Other bursitis, not elsewhere classified, unspecified site: Secondary | ICD-10-CM | POA: Diagnosis not present

## 2015-03-04 DIAGNOSIS — M2391 Unspecified internal derangement of right knee: Secondary | ICD-10-CM

## 2015-03-04 DIAGNOSIS — M25561 Pain in right knee: Secondary | ICD-10-CM | POA: Diagnosis not present

## 2015-03-04 DIAGNOSIS — M7050 Other bursitis of knee, unspecified knee: Secondary | ICD-10-CM

## 2015-03-04 MED ORDER — METHYLPREDNISOLONE ACETATE 40 MG/ML IJ SUSP
80.0000 mg | Freq: Once | INTRAMUSCULAR | Status: AC
  Administered 2015-03-04: 80 mg via INTRA_ARTICULAR

## 2015-03-04 NOTE — Progress Notes (Signed)
Pre visit review using our clinic review tool, if applicable. No additional management support is needed unless otherwise documented below in the visit note. 

## 2015-03-04 NOTE — Progress Notes (Signed)
Dr. Karleen Hampshire T. Betsabe Iglesia, MD, CAQ Sports Medicine Primary Care and Sports Medicine [address] [place], [address] Phone: [phone] Fax: [phone]  03/04/2015  Patient: Sara Stafford, MRN: [id number], DOB: Jun 24, 1945, 69 y.o.  Primary Physician:  Ruthe Mannan, MD  Chief Complaint: Knee Pain  Subjective:   This 69 y.o. female patient presents with 5 day h/o R sided knee pain after clipping hedges. + audible pop was heard, but later. The patient has not had an effusion. No symptomatic giving-way. No mechanical clicking. Joint has not locked up. Patient has been able to walk but is limping. The patient does not have pain going up and down stairs or rising from a seated position.   Woke up at 5:30 AM and has been awake all night. Has been using some voltaren gel and some NSAIDS.   Was cutting her hedges on the front porch, and then got herself in a bind and using some manual clippers and overdid it some.   Pain location: medial Current physical activity: active for age Prior Knee Surgery: none Current pain meds: alleve, voltaren gel, tylenol Bracing: none  The PMH, PSH, Social History, Family History, Medications, and allergies have been reviewed in [facility], and have been updated if relevant.  Patient Active Problem List   Diagnosis Date Noted  . Right knee pain 03/02/2015  . Macular degeneration 06/02/2014  . MAMMOGRAM, ABNORMAL, LEFT 09/10/2009  . Pulmonary nodules 03/20/2009  . ANEMIAS DUE TO DISORDERS GLUTATHIONE METABOLISM 07/31/2008  . COPD (chronic obstructive pulmonary disease) with emphysema (HCC) 10/11/2007  . Diabetes (HCC) 06/04/2007  . HYPERLIPIDEMIA 06/04/2007  . Depression 06/04/2007  . GERD 06/04/2007  . FIBROMYALGIA 06/04/2007    Past Medical History  Diagnosis Date  . COPD (chronic obstructive pulmonary disease) (HCC)     no change with spiriva, poor tolerance of advair  . Routine general medical examination at a health care facility   .  GERD (gastroesophageal reflux disease)   . Hyperlipidemia   . Fibromyalgia   . Fatigue   . Depressive disorder, not elsewhere classified   . Diabetes mellitus     Past Surgical History  Procedure Laterality Date  . Abdominal hysterectomy      partial    Social History   Social History  . Marital Status: Married    Spouse Name: N/A  . Number of Children: 2  . Years of Education: N/A   Occupational History  . retired     [employer]   Social History Main Topics  . Smoking status: Former Smoker -- 1.00 packs/day for 50 years    Quit date: 06/06/2010  . Smokeless tobacco: Never Used  . Alcohol Use: No  . Drug Use: No  . Sexual Activity: Not on file   Other Topics Concern  . Not on file   Social History Narrative   Children aged 62, and 28, expecting 2nd grandchild    Family History  Problem Relation Age of Onset  . Stroke Father 70  . Diabetes Father   . Asthma Father   . Liver cancer Sister 36  . Pancreatic cancer Sister 29  . Brain cancer Brother   . Diabetes Sister   . Diabetes Sister   . Diabetes Sister   . Diabetes Sister   . Diabetes Sister   . Diabetes Sister   . Diabetes Sister   . Diabetes Paternal Grandmother     Allergies  Allergen Reactions  . Doxycycline  REACTION: thrush    Medication list reviewed and updated in full in [facility].  ROS: no acute illness or fever MSK: above GI: tol po intake without nausea or vomitting. Neuro: no numbness, tingling, or radiculopathy O/w per hpi  Objective:   Blood pressure 140/70, pulse 104, temperature 98.3 F (36.8 C), temperature source Oral, height 5' 4.75" (1.645 m), weight 142 lb 4 oz (64.524 kg).  GEN: Well-developed,well-nourished,in no acute distress; alert,appropriate and cooperative throughout examination HEENT: Normocephalic and atraumatic without obvious abnormalities. Ears, externally no deformities PULM: Breathing comfortably in no respiratory distress EXT: No  clubbing, cyanosis, or edema PSYCH: Normally interactive. Cooperative during the interview. Pleasant. Friendly and conversant. Not anxious or depressed appearing. Normal, full affect.  R knee Gait: Normal heel toe pattern ROM: 0-125 Effusion: minimal Echymosis or edema: none Patellar tendon NT Painful PLICA: neg PES BURSA TTP Patellar grind: negative Medial and lateral patellar facet loading: negative medial and lateral joint lines: medial TTP Mcmurray's pain only Flexion-pinch pos Varus and valgus stress: stable Lachman: neg Ant and Post drawer: neg Hip abduction, IR, ER: WNL Hip flexion str: 5/5 Hip abd: 5/5 Quad: 5/5 VMO atrophy:No Hamstring concentric and eccentric: 5/5  Radiology: Dg Knee Complete 4 Views Right  03/02/2015   CLINICAL DATA:  Acute right knee pain  EXAM: RIGHT KNEE - COMPLETE 4+ VIEW  COMPARISON:  Right knee series of November 04, 2014  FINDINGS: The bones of the knee are adequately mineralized. The joint spaces are preserved. There is no joint effusion. There is no chondrocalcinosis of the menisci. The proximal fibula is intact.  IMPRESSION: There is no acute bony abnormality of the right knee.   Electronically Signed   By: David  [place] M.D.   On: 03/02/2015 16:32    Assessment and Plan:   Knee internal derangement, right  Right knee pain - Plan: methylPREDNISolone acetate (DEPO-MEDROL) injection 80 mg  Pes anserine bursitis  More likely degenerative medial meniscal tear. Trial of conservative therapy. Only approx 5% of topical Voltaren enters the bloodstream, so PO NSAIDS should be ok in the short term. Continue Tylenol and ice.   NWB trauma series reviewed. Consideration of WB PA, WB Rosenberg, and WB Lateral if symptoms persist at follow-up to assess joint space.   I appreciate the opportunity to evaluate this very friendly patient. If you have any question regarding her care or prognosis, do not hesitate to ask.   Knee Injection, R Patient verbally  consented to procedure. Risks (including potential rare risk of infection), benefits, and alternatives explained. Sterilely prepped with Chloraprep. Ethyl cholride used for anesthesia. 8 cc Lidocaine 1% mixed with Depo-Medrol 60 mg injected using the anteromedial approach without difficulty. No complications with procedure and tolerated well. Patient had decreased pain post-injection.   Pes Anserine Bursitis Injection, R Verbal consent was obtained. Risks (including rare infection, skin lightening, and potential atrophy), benefits, and alternatives explained. Sterilely prepped with Chloraprep. Ethyl chloride for anesthesia. Under sterile conditions, 2 cc of Lidocaine 1% and 1 cc of Depo-Medrol 20 mg injected directly on the pes anserinus perpendicularly taking the needle to bone then slightly withdrawing. No resistance encountered. No complications with procedure and tolerated well. Patient had decreased pain post-injection. 22 gauge 1 1/2 inch needle   Follow-up: Return in about 4 weeks (around 04/01/2015).  New Prescriptions   No medications on file   No orders of the defined types were placed in this encounter.    Signed,  Elpidio Galea. Cambria Osten, MD  Patient's Medications  New Prescriptions   No medications on file  Previous Medications   ALPRAZOLAM (XANAX) 0.5 MG TABLET    TAKE ONE-HALF TABLET BY MOUTH TWICE DAILY   ASPIRIN 81 MG TABLET    Take 81 mg by mouth as needed.     CETIRIZINE (ZYRTEC) 10 MG TABLET    TAKE ONE TABLET BY MOUTH ONCE DAILY   DICLOFENAC SODIUM (VOLTAREN) 1 % GEL    Apply 4 g topically 4 (four) times daily.   FLUTICASONE (FLONASE) 50 MCG/ACT NASAL SPRAY    Place 1 spray into the nose daily.   GLIPIZIDE (GLUCOTROL XL) 10 MG 24 HR TABLET    TAKE (2) TABLETS DAILY.   METFORMIN (GLUCOPHAGE) 1000 MG TABLET    TAKE 1 TABLET TWICE DAILY WITH MEALS   MOMETASONE-FORMOTEROL (DULERA) 100-5 MCG/ACT AERO    Inhale 2 puffs into the lungs 2 (two) times daily.   MONTELUKAST  (SINGULAIR) 10 MG TABLET    TAKE 1 TABLET AT BEDTIME.   OMEPRAZOLE (PRILOSEC) 20 MG CAPSULE    TAKE (1) CAPSULE DAILY AS NEEDED.   SIMVASTATIN (ZOCOR) 80 MG TABLET    TAKE 1/2 TABLET AT BEDTIME.   SITAGLIPTIN (JANUVIA) 50 MG TABLET    Take 1 tablet (50 mg total) by mouth daily.   VENLAFAXINE (EFFEXOR) 37.5 MG TABLET    TAKE (1/2) TABLET TWICE DAILY   VENTOLIN HFA 108 (90 BASE) MCG/ACT INHALER    INHALE 2 PUFFS EVERY 4 HOURS AS NEEDED.  Modified Medications   No medications on file  Discontinued Medications   No medications on file

## 2015-03-04 NOTE — Patient Instructions (Signed)
After you have an injection of any joint, the numbing medicine will make it feel better for a few hours. Later tonight, it is common for the joint to feel worse. The steroid will take 48-72 hours to start working - it is the thing that will likely provide the most relief.  Ice the joint where you had the injection at least 2-3 times a day for 20 minutes for 3 days. If have had swelling, pain in the joint itself, ice for 1 week.  You can use an ice bag, frozen peas or corn, an ice pack - all work  

## 2015-03-04 NOTE — Addendum Note (Signed)
Addended by: Desmond Dike on: 03/04/2015 09:27 AM   Modules accepted: Orders

## 2015-03-09 ENCOUNTER — Other Ambulatory Visit: Payer: Self-pay | Admitting: *Deleted

## 2015-03-09 ENCOUNTER — Other Ambulatory Visit: Payer: Self-pay | Admitting: Family Medicine

## 2015-03-09 NOTE — Telephone Encounter (Signed)
Last f/u 05/2014-CPE 

## 2015-03-10 MED ORDER — ALPRAZOLAM 0.5 MG PO TABS: ORAL_TABLET | ORAL | Status: DC

## 2015-03-10 NOTE — Telephone Encounter (Signed)
Rx called to pharmacy as instructed. 

## 2015-03-17 ENCOUNTER — Other Ambulatory Visit: Payer: Self-pay | Admitting: Family Medicine

## 2015-03-23 ENCOUNTER — Other Ambulatory Visit: Payer: Self-pay | Admitting: Family Medicine

## 2015-03-23 DIAGNOSIS — E119 Type 2 diabetes mellitus without complications: Secondary | ICD-10-CM

## 2015-03-23 NOTE — Telephone Encounter (Signed)
Spoke to pt and advised. Will complete labs at 11/2 appt. Rx sent to requested pharmacy

## 2015-03-23 NOTE — Telephone Encounter (Signed)
Last a1c 08/2014-abnormal

## 2015-03-23 NOTE — Telephone Encounter (Signed)
Ok to refill but she does need to come for an a1c at her earliest convenience.

## 2015-03-24 ENCOUNTER — Other Ambulatory Visit: Payer: Medicare Other

## 2015-03-31 ENCOUNTER — Other Ambulatory Visit: Payer: Self-pay | Admitting: Family Medicine

## 2015-04-01 ENCOUNTER — Ambulatory Visit (INDEPENDENT_AMBULATORY_CARE_PROVIDER_SITE_OTHER): Payer: BLUE CROSS/BLUE SHIELD | Admitting: Family Medicine

## 2015-04-01 ENCOUNTER — Encounter: Payer: Self-pay | Admitting: *Deleted

## 2015-04-01 ENCOUNTER — Encounter: Payer: Self-pay | Admitting: Family Medicine

## 2015-04-01 ENCOUNTER — Ambulatory Visit: Payer: Medicare Other | Admitting: Family Medicine

## 2015-04-01 VITALS — BP 120/60 | HR 87 | Temp 98.2°F | Ht 64.75 in | Wt 142.2 lb

## 2015-04-01 DIAGNOSIS — M25561 Pain in right knee: Secondary | ICD-10-CM | POA: Diagnosis not present

## 2015-04-01 DIAGNOSIS — E119 Type 2 diabetes mellitus without complications: Secondary | ICD-10-CM | POA: Diagnosis not present

## 2015-04-01 LAB — HEMOGLOBIN A1C: HEMOGLOBIN A1C: 7 % — AB (ref 4.6–6.5)

## 2015-04-01 NOTE — Progress Notes (Signed)
Pre visit review using our clinic review tool, if applicable. No additional management support is needed unless otherwise documented below in the visit note. 

## 2015-04-01 NOTE — Progress Notes (Signed)
Dr. Karleen HampshireSpencer T. Aunna Snooks, MD, CAQ Sports Medicine Primary Care and Sports Medicine [address] [place], [address] Phone: [phone] Fax: [phone]  04/01/2015  Patient: Sara BastosCatherine B Stafford, MRN: [id number], DOB: 07-16-1945, 69 y.o.  Primary Physician:  Sara Mannanalia Aron, MD  Chief Complaint: Follow-up  Subjective:   F/u R knee pain:  Sara MarshallJimmy went and saw Sara SitesJim Stafford.   R knee is doing much better s/p injection and history last OV in 02/2015. Doing well with no limitations now.     03/04/2015 Last OV with Sara BeatSpencer Jashira Cotugno, MD  This 69 y.o. female patient presents with 5 day h/o R sided knee pain after clipping hedges. + audible pop was heard, but later. The patient has not had an effusion. No symptomatic giving-way. No mechanical clicking. Joint has not locked up. Patient has been able to walk but is limping. The patient does not have pain going up and down stairs or rising from a seated position.   Woke up at 5:30 AM and has been awake all night. Has been using some voltaren gel and some NSAIDS.   Was cutting her hedges on the front porch, and then got herself in a bind and using some manual clippers and overdid it some.   Pain location: medial Current physical activity: active for age Prior Knee Surgery: none Current pain meds: alleve, voltaren gel, tylenol Bracing: none  The PMH, PSH, Social History, Family History, Medications, and allergies [facility], and have been updated if relevant.  Patient Active Problem List   Diagnosis Date Noted  . Right knee pain 03/02/2015  . Macular degeneration 06/02/2014  . MAMMOGRAM, ABNORMAL, LEFT 09/10/2009  . Pulmonary nodules 03/20/2009  . ANEMIAS DUE TO DISORDERS GLUTATHIONE METABOLISM 07/31/2008  . COPD (chronic obstructive pulmonary disease) with emphysema (HCC) 10/11/2007  . Diabetes (HCC) 06/04/2007  . HYPERLIPIDEMIA 06/04/2007  . Depression 06/04/2007  . GERD 06/04/2007  . FIBROMYALGIA 06/04/2007     Past Medical History  Diagnosis Date  . COPD (chronic obstructive pulmonary disease) (HCC)     no change with spiriva, poor tolerance of advair  . Routine general medical examination at a health care facility   . GERD (gastroesophageal reflux disease)   . Hyperlipidemia   . Fibromyalgia   . Fatigue   . Depressive disorder, not elsewhere classified   . Diabetes mellitus     Past Surgical History  Procedure Laterality Date  . Abdominal hysterectomy      partial    Social History   Social History  . Marital Status: Married    Spouse Name: N/A  . Number of Children: 2  . Years of Education: N/A   Occupational History  . retired     Sara Stafford   Social History Main Topics  . Smoking status: Former Smoker -- 1.00 packs/day for 50 years    Quit date: 06/06/2010  . Smokeless tobacco: Never Used  . Alcohol Use: No  . Drug Use: No  . Sexual Activity: Not on file   Other Topics Concern  . Not on file   Social History Narrative   Children aged 71, and 4535, expecting 2nd grandchild    Family History  Problem Relation Age of Onset  . Stroke Father [id number]  . Diabetes Father   . Asthma Father   . Liver cancer Sister 2881  . Pancreatic cancer Sister 3872  . Brain cancer Brother   . Diabetes Sister   . Diabetes Sister   .  Diabetes Sister   . Diabetes Sister   . Diabetes Sister   . Diabetes Sister   . Diabetes Sister   . Diabetes Paternal Grandmother     Allergies  Allergen Reactions  . Doxycycline     REACTION: thrush    Medication list reviewed and updated in full in [facility].  ROS: no acute illness or fever MSK: above GI: tol po intake without nausea or vomitting. Neuro: no numbness, tingling, or radiculopathy O/w per hpi  Objective:   Blood pressure 120/60, pulse 87, temperature 98.2 F (36.8 C), temperature source Oral, height 5' 4.75" (1.645 m), weight 142 lb 4 oz (64.524 kg).  GEN: Well-developed,well-nourished,in no acute distress;  alert,appropriate and cooperative throughout examination HEENT: Normocephalic and atraumatic without obvious abnormalities. Ears, externally no deformities PULM: Breathing comfortably in no respiratory distress EXT: No clubbing, cyanosis, or edema PSYCH: Normally interactive. Cooperative during the interview. Pleasant. Friendly and conversant. Not anxious or depressed appearing. Normal, full affect.  R knee Gait: Normal heel toe pattern ROM: 0-125 Effusion: minimal Echymosis or edema: none Patellar tendon NT Painful PLICA: neg PES BURSA NT Patellar grind: negative Medial and lateral patellar facet loading: negative medial and lateral joint lines: NT Mcmurray's pain only Flexion-pinch pos Varus and valgus stress: stable Lachman: neg Ant and Post drawer: neg Hip abduction, IR, ER: WNL Hip flexion str: 5/5 Hip abd: 5/5 Quad: 5/5 VMO atrophy:No Hamstring concentric and eccentric: 5/5  Radiology: No results found.  Assessment and Plan:   Right knee pain  Diabetes mellitus without complication (HCC) - Plan: Hemoglobin A1c  Doing great  Follow-up: prn  Signed,  Shara Hartis T. Rosamaria Donn, MD   Patient's Medications  New Prescriptions   No medications on file  Previous Medications   ALPRAZOLAM (XANAX) 0.5 MG TABLET    TAKE ONE-HALF TABLET BY MOUTH TWICE DAILY   ASPIRIN 81 MG TABLET    Take 81 mg by mouth as needed.     CETIRIZINE (ZYRTEC) 10 MG TABLET    TAKE 1 TABLET ONCE DAILY   DICLOFENAC SODIUM (VOLTAREN) 1 % GEL    Apply 4 g topically 4 (four) times daily.   FLUTICASONE (FLONASE) 50 MCG/ACT NASAL SPRAY    Place 1 spray into the nose daily.   GLIPIZIDE (GLUCOTROL XL) 10 MG 24 HR TABLET    TAKE (2) TABLETS DAILY.   JANUVIA 50 MG TABLET    TAKE 1 TABLET ONCE DAILY   METFORMIN (GLUCOPHAGE) 1000 MG TABLET    TAKE 1 TABLET TWICE DAILY WITH MEALS   MOMETASONE-FORMOTEROL (DULERA) 100-5 MCG/ACT AERO    Inhale 2 puffs into the lungs 2 (two) times daily.   MONTELUKAST  (SINGULAIR) 10 MG TABLET    TAKE 1 TABLET AT BEDTIME.   OMEPRAZOLE (PRILOSEC) 20 MG CAPSULE    TAKE (1) CAPSULE DAILY AS NEEDED.   SIMVASTATIN (ZOCOR) 80 MG TABLET    TAKE 1/2 TABLET AT BEDTIME.   VENLAFAXINE (EFFEXOR) 37.5 MG TABLET    TAKE (1/2) TABLET TWICE DAILY   VENTOLIN HFA 108 (90 BASE) MCG/ACT INHALER    INHALE 2 PUFFS EVERY 4 HOURS AS NEEDED.  Modified Medications   No medications on file  Discontinued Medications   No medications on file

## 2015-04-07 ENCOUNTER — Telehealth: Payer: Self-pay | Admitting: Family Medicine

## 2015-04-07 NOTE — Telephone Encounter (Signed)
Lm on pts vm and informed her labs are stable. Requested a cb should she have additional questions

## 2015-04-07 NOTE — Telephone Encounter (Signed)
Pt called. She is out of town and would like call back with lab results.   Please call [phone]

## 2015-04-15 ENCOUNTER — Other Ambulatory Visit: Payer: Self-pay | Admitting: Family Medicine

## 2015-04-16 ENCOUNTER — Other Ambulatory Visit: Payer: Self-pay | Admitting: Family Medicine

## 2015-04-20 ENCOUNTER — Ambulatory Visit (INDEPENDENT_AMBULATORY_CARE_PROVIDER_SITE_OTHER): Payer: BLUE CROSS/BLUE SHIELD | Admitting: Family Medicine

## 2015-04-20 ENCOUNTER — Encounter: Payer: Self-pay | Admitting: Family Medicine

## 2015-04-20 VITALS — BP 148/82 | HR 102 | Temp 98.6°F | Wt 141.2 lb

## 2015-04-20 DIAGNOSIS — J208 Acute bronchitis due to other specified organisms: Secondary | ICD-10-CM | POA: Diagnosis not present

## 2015-04-20 MED ORDER — AZITHROMYCIN 250 MG PO TABS: ORAL_TABLET | ORAL | Status: DC

## 2015-04-20 MED ORDER — BENZONATATE 100 MG PO CAPS: 100.0000 mg | ORAL_CAPSULE | Freq: Two times a day (BID) | ORAL | Status: DC | PRN

## 2015-04-20 NOTE — Progress Notes (Signed)
BP 148/82 mmHg  Pulse 102  Temp(Src) 98.6 F (37 C) (Oral)  Wt 141 lb 4 oz (64.071 kg)  SpO2 96%   CC: URI  Subjective:    Patient ID: Sara Stafford, female    DOB: 28-Nov-1945, 69 y.o.   MRN: [id number]  HPI: Sara Stafford is a 69 y.o. female presenting on 04/20/2015 for URI   3d h/o cold symptoms - ST, productive cough of green mucous, congestion, PNdrainage, loss of appetite.  Stayed at home all weekend in recliner. Mild headache.   No fevers/chills, ear or tooth pain, wheezing or dyspnea.  + sick contacts at home - husband.  No smokers at home. H/o COPD not currently on meds. Has seen Dr Shelle Ironlance  Taking plain mucinex and tessalon perls which have helped.   Relevant past medical, surgical, family and social history reviewed and updated as indicated. Interim medical history since our last visit reviewed. Allergies and medications reviewed and updated. Current Outpatient Prescriptions on File Prior to Visit  Medication Sig  . ALPRAZolam (XANAX) 0.5 MG tablet TAKE ONE-HALF TABLET BY MOUTH TWICE DAILY  . aspirin 81 MG tablet Take 81 mg by mouth as needed.    . cetirizine (ZYRTEC) 10 MG tablet TAKE 1 TABLET ONCE DAILY  . diclofenac sodium (VOLTAREN) 1 % GEL Apply 4 g topically 4 (four) times daily.  . fluticasone (FLONASE) 50 MCG/ACT nasal spray Place 1 spray into the nose daily.  Marland Kitchen. glipiZIDE (GLUCOTROL XL) 10 MG 24 hr tablet TAKE (2) TABLETS DAILY.  Marland Kitchen. JANUVIA 50 MG tablet TAKE 1 TABLET ONCE DAILY  . metFORMIN (GLUCOPHAGE) 1000 MG tablet TAKE 1 TABLET TWICE DAILY WITH MEALS  . mometasone-formoterol (DULERA) 100-5 MCG/ACT AERO Inhale 2 puffs into the lungs 2 (two) times daily.  . montelukast (SINGULAIR) 10 MG tablet TAKE 1 TABLET AT BEDTIME.  Marland Kitchen. omeprazole (PRILOSEC) 20 MG capsule TAKE (1) CAPSULE DAILY AS NEEDED.  Marland Kitchen. simvastatin (ZOCOR) 80 MG tablet TAKE 1/2 TABLET AT BEDTIME.  Marland Kitchen. venlafaxine (EFFEXOR) 37.5 MG tablet TAKE (1/2) TABLET TWICE DAILY  . VENTOLIN HFA  108 (90 BASE) MCG/ACT inhaler INHALE 2 PUFFS EVERY 4 HOURS AS NEEDED.  . [DISCONTINUED] omeprazole (PRILOSEC OTC) 20 MG tablet Take 1 tablet (20 mg total) by mouth daily as needed.   No current facility-administered medications on file prior to visit.    Review of Systems Per HPI unless specifically indicated in ROS section     Objective:    BP 148/82 mmHg  Pulse 102  Temp(Src) 98.6 F (37 C) (Oral)  Wt 141 lb 4 oz (64.071 kg)  SpO2 96%  Wt Readings from Last 3 Encounters:  04/20/15 141 lb 4 oz (64.071 kg)  04/01/15 142 lb 4 oz (64.524 kg)  03/04/15 142 lb 4 oz (64.524 kg)    Physical Exam  Constitutional: She appears well-developed and well-nourished. No distress.  HENT:  Head: Normocephalic and atraumatic.  Right Ear: Hearing, tympanic membrane, external ear and ear canal normal.  Left Ear: Hearing, tympanic membrane, external ear and ear canal normal.  Nose: No mucosal edema or rhinorrhea. Right sinus exhibits no maxillary sinus tenderness and no frontal sinus tenderness. Left sinus exhibits no maxillary sinus tenderness and no frontal sinus tenderness.  Mouth/Throat: Uvula is midline, oropharynx is clear and moist and mucous membranes are normal. No oropharyngeal exudate, posterior oropharyngeal edema, posterior oropharyngeal erythema or tonsillar abscesses.  Deviated septum  Eyes: Conjunctivae and EOM are normal. Pupils are equal, round, and reactive  to light. No scleral icterus.  Neck: Normal range of motion. Neck supple.  Cardiovascular: Normal rate, regular rhythm, normal heart sounds and intact distal pulses.   No murmur heard. Pulmonary/Chest: Effort normal. No respiratory distress. She has no decreased breath sounds. She has no wheezes. She has rhonchi (bibasilarly). She has no rales.  Somewhat coarse  Lymphadenopathy:    She has no cervical adenopathy.  Skin: Skin is warm and dry. No rash noted.  Nursing note and vitals reviewed.  Results for orders placed or  performed in visit on 04/01/15  Hemoglobin A1c  Result Value Ref Range   Hgb A1c MFr Bld 7.0 (H) 4.6 - 6.5 %      Assessment & Plan:   Problem List Items Addressed This Visit    Viral bronchitis - Primary    Anticipate viral process given short duration. Pt declines codeine cough syrup. Refilled tessalon perls. Given h/o COPD, provided with zpack to fill in case not improving with treatment as above.  Pt agrees with plan.      Relevant Medications   azithromycin (ZITHROMAX) 250 MG tablet       Follow up plan: Return if symptoms worsen or fail to improve.

## 2015-04-20 NOTE — Progress Notes (Signed)
Pre visit review using our clinic review tool, if applicable. No additional management support is needed unless otherwise documented below in the visit note. 

## 2015-04-20 NOTE — Assessment & Plan Note (Addendum)
Anticipate viral process given short duration. Pt declines codeine cough syrup. Refilled tessalon perls. Given h/o COPD, provided with zpack to fill in case not improving with treatment as above.  Pt agrees with plan.

## 2015-04-20 NOTE — Patient Instructions (Addendum)
I think you have viral bronchitis - treat with continued tessalon perls, push fluids and rest, may continue mucinex with plenty of water to help mobilize mucous.  If not improving over next several days, or fever >101 or worsening productive cough, fill zpack prescription provided today.

## 2015-04-27 ENCOUNTER — Other Ambulatory Visit: Payer: Self-pay | Admitting: Family Medicine

## 2015-05-01 ENCOUNTER — Ambulatory Visit (INDEPENDENT_AMBULATORY_CARE_PROVIDER_SITE_OTHER): Payer: BLUE CROSS/BLUE SHIELD | Admitting: Internal Medicine

## 2015-05-01 ENCOUNTER — Encounter: Payer: Self-pay | Admitting: Internal Medicine

## 2015-05-01 ENCOUNTER — Ambulatory Visit: Payer: BC Managed Care – PPO | Admitting: Pulmonary Disease

## 2015-05-01 VITALS — BP 132/72 | HR 97 | Ht 64.75 in | Wt 144.8 lb

## 2015-05-01 DIAGNOSIS — J449 Chronic obstructive pulmonary disease, unspecified: Secondary | ICD-10-CM

## 2015-05-01 NOTE — Patient Instructions (Signed)
Work on Musicianperfecting  inhaler technique:  relax and gently blow all the way out then take a nice smooth deep breath back in, triggering the inhaler at same time you start breathing in.  Hold for up to 5 seconds if you can. Blow out thru nose. Rinse and gargle with water when done   If you are satisfied with your treatment plan,  let your doctor know and he/she can either refill your medications or you can return here when your prescription runs out.     If in any way you are not 100% satisfied,  please tell us.  If 100% better, tell your friends!  Pulmonary follow up is as needed

## 2015-05-01 NOTE — Progress Notes (Signed)
Subjective:     Patient ID: Sara Stafford, female   DOB: 1945-07-20,    MRN: [id number]  HPI  Brief patient profile:   [age] yowf quit smoking 2012 with GOLD II criteria at that point and prev followed by DR Clance    History of Present Illness  05/01/2015 1st   office visit/ Sara Stafford re: transition of care on dulera 100 2 bid / singulair maint rx  Chief Complaint  Patient presents with  . Follow-up    Former [facility] pt. Pt states that her breathing is doing well. Pt reports a recent cold/congestion several weeks ago but has improved after PCP gave Zpak.    baseline maybe twice weekly saba  MMRC1 = can walk nl pace, flat grade, can't hurry or go uphills or steps s sob  (can do one flight ok but has stop sometimes)   No obvious day to day or daytime variability or assoc chronic cough or cp or chest tightness, subjective wheeze or overt sinus or hb symptoms. No unusual exp hx or h/o childhood pna/ asthma or knowledge of premature birth.  Sleeping ok without nocturnal  or early am exacerbation  of respiratory  c/o's or need for noct saba. Also denies any obvious fluctuation of symptoms with weather or environmental changes or other aggravating or alleviating factors except as outlined above   Current Medications, Allergies, Complete Past Medical History, Past Surgical History, Family History, and Social History were reviewed in [facility] electronic medical record.  ROS  The following are not active complaints unless bolded sore throat, dysphagia, dental problems, itching, sneezing,  nasal congestion or excess/ purulent secretions, ear ache,   fever, chills, sweats, unintended wt loss, classically pleuritic or exertional cp, hemoptysis,  orthopnea pnd or leg swelling, presyncope, palpitations, abdominal pain, anorexia, nausea, vomiting, diarrhea  or change in bowel or bladder habits, change in stools or urine, dysuria,hematuria,  rash, arthralgias, visual complaints, headache, numbness, weakness  or ataxia or problems with walking or coordination,  change in mood/affect or memory.      Objective:   Physical Exam    amb nad    Wt Readings from Last 3 Encounters:  05/01/15 144 lb 12.8 oz (65.681 kg)  04/20/15 141 lb 4 oz (64.071 kg)  04/01/15 142 lb 4 oz (64.524 kg)    Vital signs reviewed        HEENT: nl dentition, turbinates, and orophanx. Nl external ear canals without cough reflex   NECK :  without JVD/Nodes/TM/ nl carotid upstrokes bilaterally   LUNGS: no acc muscle use, clear to A and P bilaterally without cough on insp or exp maneuvers   CV:  RRR  no s3 or murmur or increase in P2, no edema   ABD:  soft and nontender with nl excursion in the supine position. No bruits or organomegaly, bowel sounds nl  MS:  warm without deformities, calf tenderness, cyanosis or clubbing  SKIN: warm and dry without lesions    NEURO:  alert, approp, no deficits         Assessment:           Review of Systems     Objective:   Physical Exam     Assessment:

## 2015-05-03 ENCOUNTER — Encounter: Payer: Self-pay | Admitting: Internal Medicine

## 2015-05-03 NOTE — Assessment & Plan Note (Addendum)
Quit smoking 2012  PFT's 07/27/2010:  FEV1 1.20 (52%), 14% better p saba/ ratio 49, +airtrapping, DLCO 79% No change with spiriva Participated pulmonary rehab.    - The proper method of use, as well as anticipated side effects, of a metered-dose inhaler are discussed and demonstrated to the patient. Improved effectiveness after extensive coaching during this visit to a level of approximately 75 % from a baseline of 50%  She has really done well since stopped smoking and not longer requires specialty care      I had an extended final summary discussion with the patient reviewing all relevant studies completed to date and  lasting 15 to 20 minutes of a 25 minute visit on the following issues:    I reviewed the Fletcher curve with the patient that basically indicates  if you quit smoking when your best day FEV1 is still well preserved (as is relatively still  the case here)  it is highly unlikely you will progress to severe disease and informed the patient there was no medication on the market that has proven to alter the curve/ its downward trajectory  or the likelihood of progression of their disease.  Therefore stopping smoking and maintaining abstinence is the most important aspect of care, not choice of inhalers or for that matter, doctors.    Each maintenance medication was reviewed in detail including most importantly the difference between maintenance and as needed and under what circumstances the prns are to be used.  Please see instructions for details which were reviewed in writing and the patient given a copy.

## 2015-05-14 ENCOUNTER — Other Ambulatory Visit: Payer: Self-pay | Admitting: Family Medicine

## 2015-05-15 ENCOUNTER — Telehealth: Payer: Self-pay | Admitting: Family Medicine

## 2015-05-15 NOTE — Telephone Encounter (Signed)
Pt got a call about lidocaine cream and they said that they are sending you an authorization. Pt states that she does not want this because she thinks it may be out of the network  Thank you

## 2015-05-18 ENCOUNTER — Other Ambulatory Visit: Payer: Self-pay | Admitting: Family Medicine

## 2015-06-12 DIAGNOSIS — H43813 Vitreous degeneration, bilateral: Secondary | ICD-10-CM | POA: Diagnosis not present

## 2015-06-12 DIAGNOSIS — H353123 Nonexudative age-related macular degeneration, left eye, advanced atrophic without subfoveal involvement: Secondary | ICD-10-CM | POA: Diagnosis not present

## 2015-06-12 DIAGNOSIS — H353114 Nonexudative age-related macular degeneration, right eye, advanced atrophic with subfoveal involvement: Secondary | ICD-10-CM | POA: Diagnosis not present

## 2015-06-15 ENCOUNTER — Other Ambulatory Visit: Payer: Self-pay | Admitting: Family Medicine

## 2015-06-26 ENCOUNTER — Other Ambulatory Visit: Payer: Self-pay | Admitting: Family Medicine

## 2015-07-17 ENCOUNTER — Encounter: Payer: Self-pay | Admitting: Family Medicine

## 2015-07-17 ENCOUNTER — Other Ambulatory Visit: Payer: Self-pay | Admitting: Family Medicine

## 2015-07-17 ENCOUNTER — Ambulatory Visit (INDEPENDENT_AMBULATORY_CARE_PROVIDER_SITE_OTHER): Payer: BLUE CROSS/BLUE SHIELD | Admitting: Family Medicine

## 2015-07-17 VITALS — BP 122/72 | HR 102 | Temp 98.5°F | Wt 146.0 lb

## 2015-07-17 DIAGNOSIS — B349 Viral infection, unspecified: Secondary | ICD-10-CM

## 2015-07-17 DIAGNOSIS — E782 Mixed hyperlipidemia: Secondary | ICD-10-CM

## 2015-07-17 DIAGNOSIS — Z01419 Encounter for gynecological examination (general) (routine) without abnormal findings: Secondary | ICD-10-CM

## 2015-07-17 DIAGNOSIS — E119 Type 2 diabetes mellitus without complications: Secondary | ICD-10-CM

## 2015-07-17 MED ORDER — SIMVASTATIN 80 MG PO TABS: 40.0000 mg | ORAL_TABLET | Freq: Every day | ORAL | Status: DC

## 2015-07-17 MED ORDER — VENLAFAXINE HCL 37.5 MG PO TABS: ORAL_TABLET | ORAL | Status: DC

## 2015-07-17 NOTE — Patient Instructions (Addendum)
Schedule a physical with Dr. Dayton Martes.   Keep using your inhalers in the meantime and update [place] as needed.  Ibuprofen with food, rest and fluids.  Take care.  Glad to see you.

## 2015-07-17 NOTE — Progress Notes (Signed)
Pre visit review using our clinic review tool, if applicable. No additional management support is needed unless otherwise documented below in the visit note.  Needed refill on statin and effexor.  Done, with patient to f/u with PCP.    Sx started a few days.  ST, voice is altered.  Chest > facial congestion, sputum discolored.  No fevers.  No wheeze.  Using SABA prn, with some relief, used only recently when sick.  No vomiting, no diarrhea.    Meds, vitals, and allergies reviewed.   ROS: See HPI.  Otherwise, noncontributory.  GEN: nad, alert and oriented HEENT: mucous membranes moist, tm w/o erythema, nasal exam w/o erythema, clear discharge noted,  OP with cobblestoning NECK: supple w/o LA CV: rrr.   PULM: ctab, no inc wob EXT: no edema SKIN: no acute rash

## 2015-07-20 ENCOUNTER — Telehealth: Payer: Self-pay | Admitting: Family Medicine

## 2015-07-20 DIAGNOSIS — B349 Viral infection, unspecified: Secondary | ICD-10-CM | POA: Insufficient documentation

## 2015-07-20 MED ORDER — AZITHROMYCIN 250 MG PO TABS
ORAL_TABLET | ORAL | Status: DC
Start: 2015-07-20 — End: 2015-08-05

## 2015-07-20 NOTE — Telephone Encounter (Signed)
Patient advised.

## 2015-07-20 NOTE — Telephone Encounter (Signed)
Then start zithromax.  Sent.  F/u with PCP if not better.  Thanks.

## 2015-07-20 NOTE — Telephone Encounter (Signed)
Pt called. She is not feeling any better. She still has congestion and green phlem. She has not been outdoors all weekend and has stayed at her house. Please advise.   [facility]  [phone]

## 2015-07-20 NOTE — Assessment & Plan Note (Signed)
Nontoxic, d/w pt.   Keep using her inhalers in the meantime and update [place] as needed.  Ibuprofen with food, rest and fluids.  Supportive care.

## 2015-07-24 ENCOUNTER — Other Ambulatory Visit (INDEPENDENT_AMBULATORY_CARE_PROVIDER_SITE_OTHER): Payer: BLUE CROSS/BLUE SHIELD

## 2015-07-24 DIAGNOSIS — Z0000 Encounter for general adult medical examination without abnormal findings: Secondary | ICD-10-CM

## 2015-07-24 DIAGNOSIS — E119 Type 2 diabetes mellitus without complications: Secondary | ICD-10-CM | POA: Diagnosis not present

## 2015-07-24 DIAGNOSIS — Z01419 Encounter for gynecological examination (general) (routine) without abnormal findings: Secondary | ICD-10-CM

## 2015-07-24 LAB — COMPREHENSIVE METABOLIC PANEL
ALT: 16 U/L (ref 0–35)
AST: 17 U/L (ref 0–37)
Albumin: 4.6 g/dL (ref 3.5–5.2)
Alkaline Phosphatase: 75 U/L (ref 39–117)
BUN: 14 mg/dL (ref 6–23)
CALCIUM: 9.8 mg/dL (ref 8.4–10.5)
CHLORIDE: 99 meq/L (ref 96–112)
CO2: 30 meq/L (ref 19–32)
CREATININE: 0.78 mg/dL (ref 0.40–1.20)
GFR: 77.23 mL/min (ref >60.00)
Glucose, Bld: 174 mg/dL — ABNORMAL HIGH (ref 70–99)
Potassium: 4.5 mEq/L (ref 3.5–5.1)
Sodium: 137 mEq/L (ref 135–145)
Total Bilirubin: 0.4 mg/dL (ref 0.2–1.2)
Total Protein: 7.5 g/dL (ref 6.0–8.3)

## 2015-07-24 LAB — CBC WITH DIFFERENTIAL/PLATELET
BASOS PCT: 0.4 % (ref 0.0–3.0)
Basophils Absolute: 0 10*3/uL (ref 0.0–0.1)
EOS ABS: 0.2 10*3/uL (ref 0.0–0.7)
Eosinophils Relative: 3 % (ref 0.0–5.0)
HEMATOCRIT: 38.6 % (ref 36.0–46.0)
Hemoglobin: 12.9 g/dL (ref 12.0–15.0)
LYMPHS ABS: 1.8 10*3/uL (ref 0.7–4.0)
LYMPHS PCT: 22.4 % (ref 12.0–46.0)
MCHC: 33.4 g/dL (ref 30.0–36.0)
MCV: 88.9 fl (ref 78.0–100.0)
Monocytes Absolute: 0.4 10*3/uL (ref 0.1–1.0)
Monocytes Relative: 5.2 % (ref 3.0–12.0)
NEUTROS ABS: 5.6 10*3/uL (ref 1.4–7.7)
Neutrophils Relative %: 69 % (ref 43.0–77.0)
PLATELETS: 318 10*3/uL (ref 150.0–400.0)
RBC: 4.34 Mil/uL (ref 3.87–5.11)
RDW: 13.8 % (ref 11.5–15.5)
WBC: 8.1 10*3/uL (ref 4.0–10.5)

## 2015-07-24 LAB — MICROALBUMIN / CREATININE URINE RATIO
CREATININE, U: 38.8 mg/dL
MICROALB/CREAT RATIO: 2.8 mg/g (ref 0.0–30.0)
Microalb, Ur: 1.1 mg/dL (ref 0.0–1.9)

## 2015-07-24 LAB — LIPID PANEL
CHOL/HDL RATIO: 4
Cholesterol: 153 mg/dL (ref 0–200)
HDL: 42.5 mg/dL (ref >39.00)
LDL CALC: 78 mg/dL (ref 0–99)
NonHDL: 110.58
TRIGLYCERIDES: 161 mg/dL — AB (ref 0.0–149.0)
VLDL: 32.2 mg/dL (ref 0.0–40.0)

## 2015-07-24 LAB — HEMOGLOBIN A1C: Hgb A1c MFr Bld: 7.2 % — ABNORMAL HIGH (ref 4.6–6.5)

## 2015-07-24 LAB — TSH: TSH: 1.88 u[IU]/mL (ref 0.35–4.50)

## 2015-07-29 ENCOUNTER — Encounter: Payer: Self-pay | Admitting: Family Medicine

## 2015-07-29 ENCOUNTER — Ambulatory Visit: Payer: BLUE CROSS/BLUE SHIELD

## 2015-07-29 ENCOUNTER — Ambulatory Visit (INDEPENDENT_AMBULATORY_CARE_PROVIDER_SITE_OTHER): Payer: BLUE CROSS/BLUE SHIELD | Admitting: Family Medicine

## 2015-07-29 VITALS — BP 120/80 | HR 95 | Temp 98.1°F | Ht 64.75 in | Wt 143.2 lb

## 2015-07-29 DIAGNOSIS — Z0000 Encounter for general adult medical examination without abnormal findings: Secondary | ICD-10-CM

## 2015-07-29 DIAGNOSIS — F32A Depression, unspecified: Secondary | ICD-10-CM

## 2015-07-29 DIAGNOSIS — F329 Major depressive disorder, single episode, unspecified: Secondary | ICD-10-CM

## 2015-07-29 DIAGNOSIS — E119 Type 2 diabetes mellitus without complications: Secondary | ICD-10-CM

## 2015-07-29 DIAGNOSIS — E782 Mixed hyperlipidemia: Secondary | ICD-10-CM

## 2015-07-29 DIAGNOSIS — J449 Chronic obstructive pulmonary disease, unspecified: Secondary | ICD-10-CM

## 2015-07-29 DIAGNOSIS — Z01419 Encounter for gynecological examination (general) (routine) without abnormal findings: Secondary | ICD-10-CM

## 2015-07-29 DIAGNOSIS — E2839 Other primary ovarian failure: Secondary | ICD-10-CM

## 2015-07-29 NOTE — Progress Notes (Signed)
Patient ID: Sara Stafford, female   DOB: 1946/02/27, 70 y.o.   MRN: [id number]  I reviewed health advisor's note, was available for consultation, and agree with documentation and plan.

## 2015-07-29 NOTE — Progress Notes (Signed)
Subjective:    Patient ID: Sara Stafford, female    DOB: [birth year], 70 y.o.   MRN: [id number]  HPI 65 pleasant female here for CPX/ follow up of chronic medical conditions.  Had medicare wellness visit this morning with RN.  DM- reasonable. control  On Metformin 1000 mg twice daily and glipizide 10 mg daily.    Denies any episodes of hypoglycemia.  Admits to not checking FSBS on regular basis.  Has been to a diabetic educator and says she knows what to do..  She is compliant with her medications.  Refused endo referral previously- wanted to work on diet. Lab Results  Component Value Date   HGBA1C 7.2* 07/24/2015  Negative urine microalbumin on 07/24/15.  Has been working on her diet.  Wt Readings from Last 3 Encounters:  07/29/15 143 lb 4 oz (64.978 kg)  07/29/15 143 lb 4 oz (64.978 kg)  07/17/15 146 lb (66.225 kg)    Taking zocor 40 mg daily.  Lab Results  Component Value Date   CHOL 153 07/24/2015   HDL 42.50 07/24/2015   LDLCALC 78 07/24/2015   LDLDIRECT 80.8 10/24/2012   TRIG 161.0* 07/24/2015   CHOLHDL 4 07/24/2015   Lab Results  Component Value Date   ALT 16 07/24/2015   AST 17 07/24/2015   ALKPHOS 75 07/24/2015   BILITOT 0.4 07/24/2015   Lab Results  Component Value Date   WBC 8.1 07/24/2015   HGB 12.9 07/24/2015   HCT 38.6 07/24/2015   MCV 88.9 07/24/2015   PLT 318.0 07/24/2015    Depression- on Effexor and xanax.   Denies any worsening anxiety or depression.  COPD- mild.  Has been doing pretty well on current rx. Last saw Dr. Sherene Sires on 05/01/15- note reviewed.  No changes made to rxs. Did see Dr. Para March last week for URI symptoms. She is feeling better.   Patient Active Problem List   Diagnosis Date Noted  . Medicare annual wellness visit, subsequent 07/29/2015  . Well woman exam 07/29/2015  . Macular degeneration 06/02/2014  . MAMMOGRAM, ABNORMAL, LEFT 09/10/2009  . Pulmonary nodules 03/20/2009  . ANEMIAS DUE TO DISORDERS GLUTATHIONE  METABOLISM 07/31/2008  . COPD GOLD II  10/11/2007  . Diabetes (HCC) 06/04/2007  . HYPERLIPIDEMIA 06/04/2007  . Depression 06/04/2007  . GERD 06/04/2007  . FIBROMYALGIA 06/04/2007   Past Medical History  Diagnosis Date  . COPD (chronic obstructive pulmonary disease) (HCC)     no change with spiriva, poor tolerance of advair  . Routine general medical examination at a health care facility   . GERD (gastroesophageal reflux disease)   . Hyperlipidemia   . Fibromyalgia   . Fatigue   . Depressive disorder, not elsewhere classified   . Diabetes mellitus    Past Surgical History  Procedure Laterality Date  . Abdominal hysterectomy      partial  . Cataract extraction Left 02/09/2015  . Cataract extraction Right 01/26/2015   Social History  Substance Use Topics  . Smoking status: Former Smoker -- 1.00 packs/day for 50 years    Quit date: 06/06/2010  . Smokeless tobacco: Never Used  . Alcohol Use: No   Family History  Problem Relation Age of Onset  . Stroke Father 74  . Diabetes Father   . Asthma Father   . Liver cancer Sister 52  . Pancreatic cancer Sister 64  . Brain cancer Brother   . Diabetes Sister   . Diabetes Sister   . Diabetes Sister   .  Diabetes Sister   . Diabetes Sister   . Diabetes Sister   . Diabetes Sister   . Diabetes Paternal Grandmother    Allergies  Allergen Reactions  . Doxycycline     REACTION: thrush   Current Outpatient Prescriptions on File Prior to Visit  Medication Sig Dispense Refill  . ALPRAZolam (XANAX) 0.5 MG tablet TAKE ONE-HALF TABLET BY MOUTH TWICE DAILY 60 tablet 1  . aspirin 81 MG tablet Take 81 mg by mouth as needed.      . cetirizine (ZYRTEC) 10 MG tablet TAKE 1 TABLET ONCE DAILY 30 tablet 11  . diclofenac sodium (VOLTAREN) 1 % GEL Apply 4 g topically 4 (four) times daily. 100 g 0  . glipiZIDE (GLUCOTROL XL) 10 MG 24 hr tablet TAKE (2) TABLETS DAILY. 60 tablet 5  . JANUVIA 50 MG tablet TAKE 1 TABLET ONCE DAILY 30 tablet 4  .  metFORMIN (GLUCOPHAGE) 1000 MG tablet TAKE 1 TABLET TWICE DAILY WITH MEALS 60 tablet 5  . mometasone-formoterol (DULERA) 100-5 MCG/ACT AERO Inhale 2 puffs into the lungs 2 (two) times daily. 1 Inhaler 5  . montelukast (SINGULAIR) 10 MG tablet TAKE 1 TABLET AT BEDTIME. 30 tablet 5  . omeprazole (PRILOSEC) 20 MG capsule TAKE (1) CAPSULE DAILY AS NEEDED. 30 capsule 0  . simvastatin (ZOCOR) 80 MG tablet Take 0.5 tablets (40 mg total) by mouth at bedtime. 45 tablet 0  . venlafaxine (EFFEXOR) 37.5 MG tablet TAKE (1/2) TABLET TWICE DAILY 90 tablet 0  . VENTOLIN HFA 108 (90 BASE) MCG/ACT inhaler INHALE 2 PUFFS EVERY 4 HOURS AS NEEDED. 18 g 5  . [DISCONTINUED] omeprazole (PRILOSEC OTC) 20 MG tablet Take 1 tablet (20 mg total) by mouth daily as needed. 90 tablet 1   No current facility-administered medications on file prior to visit.   The PMH, PSH, Social History, Family History, Medications, and allergies have been reviewed in [facility], and have been updated if relevant.   Review of Systems  Constitutional: Negative.   HENT: Negative.   Cardiovascular: Negative.   Gastrointestinal: Negative.   Endocrine: Negative.   Genitourinary: Negative.   Musculoskeletal: Negative.   Skin: Negative.   Allergic/Immunologic: Negative.   Neurological: Negative.   Hematological: Negative.   Psychiatric/Behavioral: Negative.   All other systems reviewed and are negative.   Objective:   Physical Exam BP 120/80 mmHg  Pulse 95  Temp(Src) 98.1 F (36.7 C) (Oral)  Ht 5' 4.75" (1.645 m)  Wt 143 lb 4 oz (64.978 kg)  BMI 24.01 kg/m2  SpO2 96% Wt Readings from Last 3 Encounters:  07/29/15 143 lb 4 oz (64.978 kg)  07/29/15 143 lb 4 oz (64.978 kg)  07/17/15 146 lb (66.225 kg)    General:  Well-developed,well-nourished,in no acute distress; alert,appropriate and cooperative throughout examination Head:  normocephalic and atraumatic.   Eyes:  vision grossly intact, pupils equal, pupils round, and pupils reactive  to light.   Ears:  R ear normal and L ear normal.   Nose:  no external deformity.   Mouth:  good dentition.   Neck:  No deformities, masses, or tenderness noted. Breasts:  No mass, nodules, thickening, tenderness, bulging, retraction, inflamation, nipple discharge or skin changes noted.   Lungs:  Normal respiratory effort, chest expands symmetrically. Lungs are clear to auscultation, no crackles or wheezes. Heart:  Normal rate and regular rhythm. S1 and S2 normal without gallop, murmur, click, rub or other extra sounds. Abdomen:  Bowel sounds positive,abdomen soft and non-tender without masses, organomegaly  or hernias noted. Rectal:  no external abnormalities.   Genitalia:  Pelvic Exam:        External: normal female genitalia without lesions or masses        Vagina: normal without lesions or masses        Cervix: normal without lesions or masses        Adnexa: normal bimanual exam without masses or fullness        Uterus: normal by palpation        Msk:  No deformity or scoliosis noted of thoracic or lumbar spine.   Extremities:  No clubbing, cyanosis, edema, or deformity noted with normal full range of motion of all joints.   Neurologic:  alert & oriented X3 and gait normal.   Skin:  Intact without suspicious lesions or rashes Cervical Nodes:  No lymphadenopathy noted Axillary Nodes:  No palpable lymphadenopathy Psych:  Cognition and judgment appear intact. Alert and cooperative with normal attention span and concentration. No apparent delusions, illusions, hallucinations     Assessment & Plan:

## 2015-07-29 NOTE — Progress Notes (Signed)
Pre visit review using our clinic review tool, if applicable. No additional management support is needed unless otherwise documented below in the visit note. 

## 2015-07-29 NOTE — Patient Instructions (Signed)
Use dilute hydrogen peroxide (1:1 with water) a few drops in your ears to help break down ear wax. Great to see you.  Keep up the good work!

## 2015-07-29 NOTE — Progress Notes (Signed)
Subjective:   Sara Stafford is a 70 y.o. female who presents for Medicare Annual (Subsequent) preventive examination.   Cardiac Risk Factors include: advanced age (>56men, >55 women);diabetes mellitus;dyslipidemia     Objective:     Vitals: BP 120/80 mmHg  Pulse 95  Temp(Src) 98.1 F (36.7 C) (Oral)  Ht 5' 4.75" (1.645 m)  Wt 143 lb 4 oz (64.978 kg)  BMI 24.01 kg/m2  SpO2 96%  Tobacco History  Smoking status  . Former Smoker -- 1.00 packs/day for 50 years  . Quit date: 06/06/2010  Smokeless tobacco  . Never Used     Counseling given: No   Past Medical History  Diagnosis Date  . COPD (chronic obstructive pulmonary disease) (HCC)     no change with spiriva, poor tolerance of advair  . Routine general medical examination at a health care facility   . GERD (gastroesophageal reflux disease)   . Hyperlipidemia   . Fibromyalgia   . Fatigue   . Depressive disorder, not elsewhere classified   . Diabetes mellitus    Past Surgical History  Procedure Laterality Date  . Abdominal hysterectomy      partial  . Cataract extraction Left 02/09/2015  . Cataract extraction Right 01/26/2015   Family History  Problem Relation Age of Onset  . Stroke Father 25  . Diabetes Father   . Asthma Father   . Liver cancer Sister 28  . Pancreatic cancer Sister 88  . Brain cancer Brother   . Diabetes Sister   . Diabetes Sister   . Diabetes Sister   . Diabetes Sister   . Diabetes Sister   . Diabetes Sister   . Diabetes Sister   . Diabetes Paternal Grandmother    History  Sexual Activity  . Sexual Activity: No    Outpatient Encounter Prescriptions as of 07/29/2015  Medication Sig  . ALPRAZolam (XANAX) 0.5 MG tablet TAKE ONE-HALF TABLET BY MOUTH TWICE DAILY  . aspirin 81 MG tablet Take 81 mg by mouth as needed.    . cetirizine (ZYRTEC) 10 MG tablet TAKE 1 TABLET ONCE DAILY  . diclofenac sodium (VOLTAREN) 1 % GEL Apply 4 g topically 4 (four) times daily.  Marland Kitchen glipiZIDE  (GLUCOTROL XL) 10 MG 24 hr tablet TAKE (2) TABLETS DAILY.  Marland Kitchen JANUVIA 50 MG tablet TAKE 1 TABLET ONCE DAILY  . metFORMIN (GLUCOPHAGE) 1000 MG tablet TAKE 1 TABLET TWICE DAILY WITH MEALS  . mometasone-formoterol (DULERA) 100-5 MCG/ACT AERO Inhale 2 puffs into the lungs 2 (two) times daily.  . montelukast (SINGULAIR) 10 MG tablet TAKE 1 TABLET AT BEDTIME.  Marland Kitchen omeprazole (PRILOSEC) 20 MG capsule TAKE (1) CAPSULE DAILY AS NEEDED.  Marland Kitchen simvastatin (ZOCOR) 80 MG tablet Take 0.5 tablets (40 mg total) by mouth at bedtime.  Marland Kitchen venlafaxine (EFFEXOR) 37.5 MG tablet TAKE (1/2) TABLET TWICE DAILY  . VENTOLIN HFA 108 (90 BASE) MCG/ACT inhaler INHALE 2 PUFFS EVERY 4 HOURS AS NEEDED.   No facility-administered encounter medications on file as of 07/29/2015.    Activities of Daily Living In your present state of health, do you have any difficulty performing the following activities: 07/29/2015  Hearing? N  Vision? N  Difficulty concentrating or making decisions? N  Walking or climbing stairs? N  Dressing or bathing? N  Doing errands, shopping? N  Preparing Food and eating ? N  Using the Toilet? N  In the past six months, have you accidently leaked urine? N  Do you have problems with loss  of bowel control? N  Managing your Medications? N  Managing your Finances? N  Housekeeping or managing your Housekeeping? N    Patient Care Team: Dianne Dun, MD as PCP - General (Family Medicine) Barbaraann Share, MD as Consulting Physician (Pulmonary Disease) Stephannie Li, MD as Consulting Physician (Ophthalmology) Nyoka Cowden, MD as Consulting Physician (Pulmonary Disease) Hannah Beat, MD as Consulting Physician (Family Medicine)    Assessment:     Exercise Activities and Dietary recommendations Current Exercise Habits:: Home exercise routine, Type of exercise: walking, Time (Minutes): 30, Frequency (Times/Week): 3, Weekly Exercise (Minutes/Week): 90, Intensity: Mild  Goals    Starting 07/29/15, I will  decrease the amount of starchy foods and sweets during evening meal.     Fall Risk Fall Risk  07/29/2015 06/02/2014  Falls in the past year? No No   Depression Screen PHQ 2/9 Scores 07/29/2015 06/02/2014  PHQ - 2 Score 0 0     Cognitive Testing MMSE - Mini Mental State Exam 07/29/2015  Orientation to time 5  Orientation to Place 5  Registration 3  Attention/ Calculation 5  Recall 3  Language- name 2 objects 0  Language- repeat 1  Language- follow 3 step command 3  Language- read & follow direction 1  Write a sentence 0  Copy design 0  Total score 26    Immunization History  Administered Date(s) Administered  . Influenza Split 02/29/2012  . Influenza Whole 03/09/2010, 02/28/2011  . Influenza,inj,Quad PF,36+ Mos 03/19/2013, 03/02/2015  . Influenza-Unspecified 01/28/2014  . Pneumococcal Conjugate-13 03/03/2014  . Pneumococcal Polysaccharide-23 09/17/2009  . Zoster 10/13/2009   Screening Tests Health Maintenance  Topic Date Due  . Fecal DNA (Cologuard)  Ordered  . MAMMOGRAM  Completed  . DEXA SCAN  Ordered 10/29/2015 (Originally 11/18/2008)  . TETANUS/TDAP  07/28/2016 (Originally 11/19/1964) - verification of insurance coverage  . Hepatitis C Screening  07/28/2016 (Originally [birth year]) - will complete at next lab appt  . INFLUENZA VACCINE  12/29/2015  . HEMOGLOBIN A1C  01/21/2016  . OPHTHALMOLOGY EXAM  06/30/2016  . URINE MICROALBUMIN  07/23/2016  . FOOT EXAM  07/28/2016  . ZOSTAVAX  Completed  . PNA vac Low Risk Adult  Completed      Plan:     I have personally reviewed the Medicare Annual Wellness questionnaire and have noted the following in the patient's chart:  A. Medical and social history B. Use of alcohol, tobacco or illicit drugs  C. Current medications and supplements D. Functional ability and status E.  Nutritional status F.  Physical activity G. Advance directives H. List of other physicians I.  Hospitalizations, surgeries, and ER visits in previous 12  months J.  Vitals K. Screenings to include hearing, vision, cognitive, depression L. Referrals, if needed  In addition, I reviewed preventive protocols, quality metrics, and best practice recommendations specific to patient. A written personalized care plan for preventive services as well as general preventive health recommendations was provided to patient.  See attached scanned questionnaire for additional information.   Signed,   Randa Evens, MHA, BS, LPN Health Advisor  07/29/2015

## 2015-07-29 NOTE — Patient Instructions (Addendum)
Sara Stafford , Thank you for taking time to come for your Medicare Wellness Visit. I appreciate your ongoing commitment to your health goals. Please review the following plan we discussed and let me know if I can assist you in the future.   These are the goals we discussed: Goals    Starting 07/29/15, I will decrease the amount of starchy foods and sweets during evening meal.       This is a list of the screening recommended for you and due dates:  Health Maintenance  Topic Date Due  .  Hepatitis C: One time screening is recommended by Center for Disease Control  (CDC) for  adults born from [birth year] through 1967.   Will due at next lab appt  . Tetanus Vaccine  07/28/2025  . DEXA scan (bone density measurement)  Scheduling  . Eye exam for diabetics  Completed  . Mammogram  Completed  . Colon Cancer Screening  Ordered Cologuard  . Flu Shot  12/29/2015  . Hemoglobin A1C  01/21/2016  . Urine Protein Check  07/23/2016  . Complete foot exam   07/28/2016  . Shingles Vaccine  Completed  . Pneumonia vaccines  Completed  *Topic was postponed. The date shown is not the original due date.   Preventive Care for Adults  A healthy lifestyle and preventive care can promote health and wellness. Preventive health guidelines for women include the following key practices.  . A routine yearly physical is a good way to check with your health care provider about your health and preventive screening. It is a chance to share any concerns and updates on your health and to receive a thorough exam.  . Visit your dentist for a routine exam and preventive care every 6 months. Brush your teeth twice a day and floss once a day. Good oral hygiene prevents tooth decay and gum disease.  . The frequency of eye exams is based on your age, health, family medical history, use  of contact lenses, and other factors. Follow your health care provider's ecommendations for frequency of eye exams.  . Eat a healthy diet. Foods  like vegetables, fruits, whole grains, low-fat dairy products, and lean protein foods contain the nutrients you need without too many calories. Decrease your intake of foods high in solid fats, added sugars, and salt. Eat the right amount of calories for you. Get information about a proper diet from your health care provider, if necessary.  . Regular physical exercise is one of the most important things you can do for your health. Most adults should get at least 150 minutes of moderate-intensity exercise (any activity that increases your heart rate and causes you to sweat) each week. In addition, most adults need muscle-strengthening exercises on 2 or more days a week.  . Maintain a healthy weight. The body mass index (BMI) is a screening tool to identify possible weight problems. It provides an estimate of body fat based on height and weight. Your health care provider can find your BMI and can help you achieve or maintain a healthy weight.  For adults 20 years and older: ? A BMI below 18.5 is considered underweight. ? A BMI of 18.5 to 24.9 is normal. ? A BMI of 25 to 29.9 is considered overweight. ? A BMI of 30 and above is considered obese.   . Maintain normal blood lipids and cholesterol levels by exercising and minimizing your intake of saturated fat. Eat a balanced diet with plenty of fruit and vegetables.  Blood tests for lipids and cholesterol should begin at age 26 and be repeated every 5 years. If your lipid or cholesterol levels are high, you are over 50, or you are at high risk for heart disease, you may need your cholesterol levels checked more frequently. Ongoing high lipid and cholesterol levels should be treated with medicines if diet and exercise are not working.  . If you smoke, find out from your health care provider how to quit. If you do not use tobacco, please do not start.  . If you choose to drink alcohol, please do not consume more than 2 drinks per day. One drink is considered  to be 12 ounces (355 mL) of beer, 5 ounces (148 mL) of wine, or 1.5 ounces (44 mL) of liquor.  . If you are 66-6 years old, ask your health care provider if you should take aspirin to prevent strokes.  . Osteoporosis is a disease in which the bones lose minerals and strength with aging. This can result in serious bone fractures or breaks. The risk of osteoporosis can be identified using a bone density scan. Women ages 75 years and over and women at risk for fractures or osteoporosis should discuss screening with their health care providers. Ask your health care provider whether you should take a calcium supplement or vitamin D to reduce the rate of osteoporosis.  . Menopause can be associated with physical symptoms and risks. Hormone replacement therapy is available to decrease symptoms and risks. You should talk to your health care provider about whether hormone replacement therapy is right for you.  . Use sunscreen. Apply sunscreen liberally and repeatedly throughout the day. You should seek shade when your shadow is shorter than you. Protect yourself by wearing long sleeves, pants, a wide-brimmed hat, and sunglasses year round, whenever you are outdoors.  . Once a month, do a whole body skin exam, using a mirror to look at the skin on your back. Tell your health care provider of new moles, moles that have irregular borders, moles that are larger than a pencil eraser, or moles that have changed in shape or color.

## 2015-07-29 NOTE — Assessment & Plan Note (Signed)
Well controlled on current rx. No changes made today. 

## 2015-07-29 NOTE — Assessment & Plan Note (Signed)
Reasonable control. No changes made today. 

## 2015-07-29 NOTE — Assessment & Plan Note (Signed)
Reviewed preventive care protocols, scheduled due services, and updated immunizations Discussed nutrition, exercise, diet, and healthy lifestyle.  BME and breast exam today.

## 2015-07-29 NOTE — Assessment & Plan Note (Signed)
Followed by pulm. Stable on current rxs.

## 2015-07-30 ENCOUNTER — Other Ambulatory Visit: Payer: Self-pay | Admitting: Family Medicine

## 2015-08-03 ENCOUNTER — Other Ambulatory Visit: Payer: Self-pay | Admitting: Family Medicine

## 2015-08-03 MED ORDER — ALPRAZOLAM 0.5 MG PO TABS: ORAL_TABLET | ORAL | Status: DC

## 2015-08-05 ENCOUNTER — Ambulatory Visit (INDEPENDENT_AMBULATORY_CARE_PROVIDER_SITE_OTHER): Payer: BLUE CROSS/BLUE SHIELD | Admitting: Family Medicine

## 2015-08-05 ENCOUNTER — Encounter: Payer: Self-pay | Admitting: Family Medicine

## 2015-08-05 VITALS — BP 128/62 | HR 119 | Temp 99.7°F | Wt 144.5 lb

## 2015-08-05 DIAGNOSIS — J069 Acute upper respiratory infection, unspecified: Secondary | ICD-10-CM | POA: Insufficient documentation

## 2015-08-05 MED ORDER — BENZONATATE 100 MG PO CAPS: 100.0000 mg | ORAL_CAPSULE | Freq: Two times a day (BID) | ORAL | Status: DC | PRN

## 2015-08-05 MED ORDER — AZITHROMYCIN 250 MG PO TABS: ORAL_TABLET | ORAL | Status: DC

## 2015-08-05 NOTE — Addendum Note (Signed)
Addended by: Dianne DunARON, TALIA M on: 08/05/2015 12:10 PM   Modules accepted: Orders

## 2015-08-05 NOTE — Assessment & Plan Note (Addendum)
Likely viral given short and benign course. Given h/o COPD, given rx for zpack to fill if symptoms progress. Tessalon eRx sent. Call or return to clinic prn if these symptoms worsen or fail to improve as anticipated. The patient indicates understanding of these issues and agrees with the plan.

## 2015-08-05 NOTE — Progress Notes (Addendum)
BP 128/62 mmHg  Pulse 119  Temp(Src) 99.7 F (37.6 C) (Oral)  Wt 144 lb 8 oz (65.545 kg)  SpO2 98%   CC: URI  Subjective:    Patient ID: Sara Bastosatherine B Lograsso, female    DOB: [birth year], 70 y.o.   MRN: [id number]  HPI: Sara BastosCatherine B Stafford is a 70 y.o. female presenting on 08/05/2015 for Cough; Nasal Congestion; and Diarrhea   2 days of ST, productive cough of green mucous, congestion.    No fevers/chills, ear or tooth pain, wheezing or dyspnea.  + sick contacts at home - husband.   H/o COPD not currently on meds.   Taking  mucinex which has helped some.     Current Outpatient Prescriptions on File Prior to Visit  Medication Sig  . ALPRAZolam (XANAX) 0.5 MG tablet TAKE ONE-HALF TABLET BY MOUTH TWICE DAILY  . aspirin 81 MG tablet Take 81 mg by mouth as needed.    . cetirizine (ZYRTEC) 10 MG tablet TAKE 1 TABLET ONCE DAILY  . diclofenac sodium (VOLTAREN) 1 % GEL Apply 4 g topically 4 (four) times daily.  Marland Kitchen. glipiZIDE (GLUCOTROL XL) 10 MG 24 hr tablet TAKE (2) TABLETS DAILY.  Marland Kitchen. JANUVIA 50 MG tablet TAKE 1 TABLET ONCE DAILY  . metFORMIN (GLUCOPHAGE) 1000 MG tablet TAKE 1 TABLET TWICE DAILY WITH MEALS  . mometasone-formoterol (DULERA) 100-5 MCG/ACT AERO Inhale 2 puffs into the lungs 2 (two) times daily.  . montelukast (SINGULAIR) 10 MG tablet TAKE 1 TABLET AT BEDTIME.  Marland Kitchen. omeprazole (PRILOSEC) 20 MG capsule TAKE (1) CAPSULE DAILY AS NEEDED.  Marland Kitchen. simvastatin (ZOCOR) 80 MG tablet Take 0.5 tablets (40 mg total) by mouth at bedtime.  Marland Kitchen. venlafaxine (EFFEXOR) 37.5 MG tablet TAKE (1/2) TABLET TWICE DAILY  . VENTOLIN HFA 108 (90 BASE) MCG/ACT inhaler INHALE 2 PUFFS EVERY 4 HOURS AS NEEDED.  . [DISCONTINUED] omeprazole (PRILOSEC OTC) 20 MG tablet Take 1 tablet (20 mg total) by mouth daily as needed.   No current facility-administered medications on file prior to visit.   Current Outpatient Prescriptions on File Prior to Visit  Medication Sig Dispense Refill  . ALPRAZolam (XANAX) 0.5 MG  tablet TAKE ONE-HALF TABLET BY MOUTH TWICE DAILY 60 tablet 1  . aspirin 81 MG tablet Take 81 mg by mouth as needed.      . cetirizine (ZYRTEC) 10 MG tablet TAKE 1 TABLET ONCE DAILY 30 tablet 11  . diclofenac sodium (VOLTAREN) 1 % GEL Apply 4 g topically 4 (four) times daily. 100 g 0  . glipiZIDE (GLUCOTROL XL) 10 MG 24 hr tablet TAKE (2) TABLETS DAILY. 60 tablet 5  . JANUVIA 50 MG tablet TAKE 1 TABLET ONCE DAILY 30 tablet 4  . metFORMIN (GLUCOPHAGE) 1000 MG tablet TAKE 1 TABLET TWICE DAILY WITH MEALS 60 tablet 5  . mometasone-formoterol (DULERA) 100-5 MCG/ACT AERO Inhale 2 puffs into the lungs 2 (two) times daily. 1 Inhaler 5  . montelukast (SINGULAIR) 10 MG tablet TAKE 1 TABLET AT BEDTIME. 30 tablet 5  . omeprazole (PRILOSEC) 20 MG capsule TAKE (1) CAPSULE DAILY AS NEEDED. 90 capsule 3  . simvastatin (ZOCOR) 80 MG tablet Take 0.5 tablets (40 mg total) by mouth at bedtime. 45 tablet 0  . venlafaxine (EFFEXOR) 37.5 MG tablet TAKE (1/2) TABLET TWICE DAILY 90 tablet 0  . VENTOLIN HFA 108 (90 BASE) MCG/ACT inhaler INHALE 2 PUFFS EVERY 4 HOURS AS NEEDED. 18 g 5  . [DISCONTINUED] omeprazole (PRILOSEC OTC) 20 MG tablet Take 1 tablet (20  mg total) by mouth daily as needed. 90 tablet 1   No current facility-administered medications on file prior to visit.    Allergies  Allergen Reactions  . Doxycycline     REACTION: thrush    Past Medical History  Diagnosis Date  . COPD (chronic obstructive pulmonary disease) (HCC)     no change with spiriva, poor tolerance of advair  . Routine general medical examination at a health care facility   . GERD (gastroesophageal reflux disease)   . Hyperlipidemia   . Fibromyalgia   . Fatigue   . Depressive disorder, not elsewhere classified   . Diabetes mellitus     Past Surgical History  Procedure Laterality Date  . Abdominal hysterectomy      partial  . Cataract extraction Left 02/09/2015  . Cataract extraction Right 01/26/2015    Family History    Problem Relation Age of Onset  . Stroke Father 77  . Diabetes Father   . Asthma Father   . Liver cancer Sister 67  . Pancreatic cancer Sister 29  . Brain cancer Brother   . Diabetes Sister   . Diabetes Sister   . Diabetes Sister   . Diabetes Sister   . Diabetes Sister   . Diabetes Sister   . Diabetes Sister   . Diabetes Paternal Grandmother     Social History   Social History  . Marital Status: Married    Spouse Name: N/A  . Number of Children: 2  . Years of Education: N/A   Occupational History  . retired     Sara Stafford   Social History Main Topics  . Smoking status: Former Smoker -- 1.00 packs/day for 50 years    Quit date: 06/06/2010  . Smokeless tobacco: Never Used  . Alcohol Use: No  . Drug Use: No  . Sexual Activity: No   Other Topics Concern  . Not on file   Social History Narrative   Children aged 51, and 2, expecting 2nd grandchild    Review of Systems  Constitutional: Positive for fatigue. Negative for fever.  HENT: Positive for congestion, postnasal drip, rhinorrhea, sinus pressure, sneezing and sore throat. Negative for trouble swallowing and voice change.   Respiratory: Positive for cough. Negative for shortness of breath, wheezing and stridor.   Cardiovascular: Negative.   Gastrointestinal: Negative.   Endocrine: Negative.   Genitourinary: Negative.   Musculoskeletal: Negative.   Skin: Negative.   Neurological: Negative.   Hematological: Negative.   Psychiatric/Behavioral: Negative.         Objective:    BP 128/62 mmHg  Pulse 119  Temp(Src) 99.7 F (37.6 C) (Oral)  Wt 144 lb 8 oz (65.545 kg)  SpO2 98%  Wt Readings from Last 3 Encounters:  08/05/15 144 lb 8 oz (65.545 kg)  07/29/15 143 lb 4 oz (64.978 kg)  07/29/15 143 lb 4 oz (64.978 kg)    Physical Exam  Constitutional: She appears well-developed and well-nourished. No distress.  HENT:  Head: Normocephalic and atraumatic.  Right Ear: Hearing, tympanic membrane, external  ear and ear canal normal.  Left Ear: Hearing, tympanic membrane, external ear and ear canal normal.  Nose: No mucosal edema or rhinorrhea. Right sinus exhibits no maxillary sinus tenderness and no frontal sinus tenderness. Left sinus exhibits no maxillary sinus tenderness and no frontal sinus tenderness.  Mouth/Throat: Uvula is midline, oropharynx is clear and moist and mucous membranes are normal. No oropharyngeal exudate, posterior oropharyngeal edema, posterior oropharyngeal erythema or tonsillar abscesses.  Deviated septum  Eyes: Conjunctivae and EOM are normal. Pupils are equal, round, and reactive to light. No scleral icterus.  Neck: Normal range of motion. Neck supple.  Cardiovascular: Regular rhythm, normal heart sounds and intact distal pulses.   No murmur heard. Pulmonary/Chest: Effort normal. No respiratory distress. She has no decreased breath sounds. She has no wheezes. She has rhonchi (bibasilarly). She has no rales.  Somewhat coarse  Lymphadenopathy:    She has no cervical adenopathy.  Skin: Skin is warm and dry. No rash noted.  Nursing note and vitals reviewed.  Results for orders placed or performed in visit on 07/24/15  CBC with Differential/Platelet  Result Value Ref Range   WBC 8.1 4.0 - 10.5 K/uL   RBC 4.34 3.87 - 5.11 Mil/uL   Hemoglobin 12.9 12.0 - 15.0 g/dL   HCT 29.5 62.1 - 30.8 %   MCV 88.9 78.0 - 100.0 fl   MCHC 33.4 30.0 - 36.0 g/dL   RDW 65.7 84.6 - 96.2 %   Platelets 318.0 150.0 - 400.0 K/uL   Neutrophils Relative % 69.0 43.0 - 77.0 %   Lymphocytes Relative 22.4 12.0 - 46.0 %   Monocytes Relative 5.2 3.0 - 12.0 %   Eosinophils Relative 3.0 0.0 - 5.0 %   Basophils Relative 0.4 0.0 - 3.0 %   Neutro Abs 5.6 1.4 - 7.7 K/uL   Lymphs Abs 1.8 0.7 - 4.0 K/uL   Monocytes Absolute 0.4 0.1 - 1.0 K/uL   Eosinophils Absolute 0.2 0.0 - 0.7 K/uL   Basophils Absolute 0.0 0.0 - 0.1 K/uL  Comprehensive metabolic panel  Result Value Ref Range   Sodium 137 135 - 145  mEq/L   Potassium 4.5 3.5 - 5.1 mEq/L   Chloride 99 96 - 112 mEq/L   CO2 30 19 - 32 mEq/L   Glucose, Bld 174 (H) 70 - 99 mg/dL   BUN 14 6 - 23 mg/dL   Creatinine, Ser 9.52 0.40 - 1.20 mg/dL   Total Bilirubin 0.4 0.2 - 1.2 mg/dL   Alkaline Phosphatase 75 39 - 117 U/L   AST 17 0 - 37 U/L   ALT 16 0 - 35 U/L   Total Protein 7.5 6.0 - 8.3 g/dL   Albumin 4.6 3.5 - 5.2 g/dL   Calcium 9.8 8.4 - 84.1 mg/dL   GFR 32.44 >01.02 mL/min  Lipid panel  Result Value Ref Range   Cholesterol 153 0 - 200 mg/dL   Triglycerides 725.3 (H) 0.0 - 149.0 mg/dL   HDL 66.44 >03.47 mg/dL   VLDL 42.5 0.0 - 95.6 mg/dL   LDL Cholesterol 78 0 - 99 mg/dL   Total CHOL/HDL Ratio 4    NonHDL 110.58   TSH  Result Value Ref Range   TSH 1.88 0.35 - 4.50 uIU/mL  Hemoglobin A1c  Result Value Ref Range   Hgb A1c MFr Bld 7.2 (H) 4.6 - 6.5 %  Microalbumin / creatinine urine ratio  Result Value Ref Range   Microalb, Ur 1.1 0.0 - 1.9 mg/dL   Creatinine,U 38.7 mg/dL   Microalb Creat Ratio 2.8 0.0 - 30.0 mg/g

## 2015-08-05 NOTE — Progress Notes (Signed)
Pre visit review using our clinic review tool, if applicable. No additional management support is needed unless otherwise documented below in the visit note. 

## 2015-08-14 ENCOUNTER — Telehealth: Payer: Self-pay | Admitting: Internal Medicine

## 2015-08-14 NOTE — Telephone Encounter (Signed)
atc pt X3, line went to fast busy signal.  Wcb.

## 2015-08-17 ENCOUNTER — Other Ambulatory Visit: Payer: Self-pay | Admitting: Family Medicine

## 2015-08-17 NOTE — Telephone Encounter (Signed)
Pt calling back again.

## 2015-08-17 NOTE — Telephone Encounter (Signed)
LMTCB

## 2015-08-17 NOTE — Telephone Encounter (Signed)
[phone] 

## 2015-08-17 NOTE — Telephone Encounter (Signed)
Pt request refill dulera; advised pt already sent to [place] [facility].

## 2015-08-18 ENCOUNTER — Other Ambulatory Visit: Payer: Self-pay

## 2015-08-18 DIAGNOSIS — E2839 Other primary ovarian failure: Secondary | ICD-10-CM

## 2015-08-18 NOTE — Telephone Encounter (Signed)
Rx was sent yesterday 08/17/15  Medication Detail       Disp Refills Start End      DULERA 100-5 MCG/ACT AERO 13 g 0 08/17/2015      Sig: INHALE 2 PUFFS TWICE DAILY.     E-Prescribing Status: Receipt confirmed by pharmacy (08/17/2015 9:23 AM EDT)       Attempted to contact patient to let her know that Rx has been sent.  Line rang busy. Will call back.

## 2015-08-19 NOTE — Telephone Encounter (Signed)
Spoke with pt and she has already picked up prescription. Nothing further needed.

## 2015-08-27 ENCOUNTER — Ambulatory Visit: Payer: BLUE CROSS/BLUE SHIELD

## 2015-09-01 ENCOUNTER — Ambulatory Visit
Admission: RE | Admit: 2015-09-01 | Discharge: 2015-09-01 | Disposition: A | Discharge status: Home / Self Care | Payer: BLUE CROSS/BLUE SHIELD | Source: Ambulatory Visit | Attending: Family Medicine | Admitting: Family Medicine

## 2015-09-01 DIAGNOSIS — M810 Age-related osteoporosis without current pathological fracture: Secondary | ICD-10-CM | POA: Insufficient documentation

## 2015-09-01 DIAGNOSIS — E2839 Other primary ovarian failure: Secondary | ICD-10-CM | POA: Insufficient documentation

## 2015-09-01 DIAGNOSIS — E119 Type 2 diabetes mellitus without complications: Secondary | ICD-10-CM | POA: Diagnosis not present

## 2015-09-14 ENCOUNTER — Other Ambulatory Visit: Payer: Self-pay | Admitting: Family Medicine

## 2015-09-21 ENCOUNTER — Telehealth: Payer: Self-pay | Admitting: Internal Medicine

## 2015-09-21 MED ORDER — MOMETASONE FURO-FORMOTEROL FUM 100-5 MCG/ACT IN AERO: INHALATION_SPRAY | RESPIRATORY_TRACT | Status: DC

## 2015-09-21 NOTE — Telephone Encounter (Signed)
Spoke with the pt  I advised will go ahead and refill this again, but to inquire on PCP refilling from now on since we will be seeing her PRN only  She will call to schedule a f/u if her PCP will not take over refilling this  Rx was sent to pharm x 1 only

## 2015-10-05 ENCOUNTER — Encounter: Payer: Self-pay | Admitting: Family Medicine

## 2015-10-05 ENCOUNTER — Ambulatory Visit (INDEPENDENT_AMBULATORY_CARE_PROVIDER_SITE_OTHER): Payer: BLUE CROSS/BLUE SHIELD | Admitting: Family Medicine

## 2015-10-05 VITALS — BP 136/72 | HR 90 | Temp 97.9°F | Wt 143.0 lb

## 2015-10-05 DIAGNOSIS — M810 Age-related osteoporosis without current pathological fracture: Secondary | ICD-10-CM | POA: Diagnosis not present

## 2015-10-05 MED ORDER — ALENDRONATE SODIUM 70 MG PO TABS: 70.0000 mg | ORAL_TABLET | ORAL | Status: DC

## 2015-10-05 NOTE — Progress Notes (Signed)
Subjective:   Patient ID: Sara Stafford, female    DOB: 11/20/1945, 70 y.o.   MRN: [id number]  Sara Stafford is a pleasant 70 y.o. year old female who presents to clinic today with Follow-up  on 10/05/2015  HPI: Here to discuss recent DEXA scan which indicated that she does have osteoporosis.  Recent fall on her tail bone.  EXAM: DUAL X-RAY ABSORPTIOMETRY (DXA) FOR BONE MINERAL DENSITY  IMPRESSION: Dear Dr. Dayton Martes,  Your patient Sara Stafford completed a BMD test on 09/01/2015 using the Lunar iDXA DXA System (analysis version: 14.10) manufactured by [employer]. The following summarizes the results of our evaluation.  PATIENT BIOGRAPHICAL: Name: Sara Stafford, Sara Stafford Patient ID: [id number] Birth Date: 1946/05/01 Height: 64.5 in. Gender: Female Exam Date: 09/01/2015 Weight: 143.0 lbs. Indications: Caucasian, Diabetic, Hysterectomy, Oophorectomy  Unilateral Fractures: Treatments:  ASSESSMENT: The BMD measured at Femur Neck Left is 0.665 g/cm2 with a T-score of -2.7. This patient is considered OSTEOPOROTIC according to World Health Organization [facility]) criteria. L-4 was excluded due to degenerative changes.  Site Region Measured Date Measured Age WHO Classification Young Adult T-score BMD  AP Spine L1-L3 09/01/2015 71.7 Osteoporosis -2.5 0.879 g/cm2 AP Spine L1-L3 09/29/2009 65.8 Osteoporosis -2.8 0.837 g/cm2  DualFemur Neck Left 09/01/2015 71.7 Osteoporosis -2.7 0.665 g/cm2 DualFemur Neck Left 09/29/2009 65.8 Osteoporosis -2.6 0.682 g/cm2  World Health Organization [facility]) criteria for post-menopausal, Caucasian Women: Normal: T-score at or above -1 SD Osteopenia: T-score between -1 and -2.5 SD Osteoporosis: T-score at or below -2.5 SD  RECOMMENDATIONS: National Osteoporosis Foundation recommends that FDA-approved medical therapies be considered in postmenopausal women and men age 73 or older with a: 1. Hip or  vertebral (clinical or morphometric) fracture. 2. T-score of < -2.5 at the spine or hip. 3. Ten-year fracture probability by FRAX of 3% or greater for hip fracture or 20% or greater for major osteoporotic fracture.  All treatment decisions require clinical judgment and consideration of individual patient factors, including patient preferences, co-morbidities, previous drug use, risk factors not captured in the FRAX model (e.g. falls, vitamin D deficiency, increased bone turnover, interval significant decline in bone density) and possible under - or over-estimation of fracture risk by FRAX.  All patients should ensure an adequate intake of dietary calcium (1200 mg/d) and vitamin D (800 IU daily) unless contraindicated.  FOLLOW-UP: People with diagnosed cases of osteoporosis or at high risk for fracture should have regular bone mineral density tests. For patients eligible for Medicare, routine testing is allowed once every 2 years. The testing frequency can be increased to one year for patients who have rapidly progressing disease, those who are receiving or discontinuing medical therapy to restore bone mass, or have additional risk factors.  I have reviewed this report, and agree with the above findings.  Interpreted by:  Quincy Carnes, MD, CCD  Current Outpatient Prescriptions on File Prior to Visit  Medication Sig Dispense Refill  . ALPRAZolam (XANAX) 0.5 MG tablet TAKE ONE-HALF TABLET BY MOUTH TWICE DAILY 60 tablet 1  . aspirin 81 MG tablet Take 81 mg by mouth as needed.      . cetirizine (ZYRTEC) 10 MG tablet TAKE 1 TABLET ONCE DAILY 30 tablet 11  . diclofenac sodium (VOLTAREN) 1 % GEL Apply 4 g topically 4 (four) times daily. 100 g 0  . glipiZIDE (GLUCOTROL XL) 10 MG 24 hr tablet TAKE (2) TABLETS DAILY. 60 tablet 5  . JANUVIA 50 MG tablet TAKE 1 TABLET ONCE DAILY 30 tablet 4  .  metFORMIN (GLUCOPHAGE) 1000 MG tablet TAKE 1 TABLET TWICE DAILY WITH MEALS 60 tablet 5  .  mometasone-formoterol (DULERA) 100-5 MCG/ACT AERO INHALE 2 PUFFS TWICE DAILY. 13 g 0  . montelukast (SINGULAIR) 10 MG tablet TAKE 1 TABLET AT BEDTIME. 30 tablet 10  . omeprazole (PRILOSEC) 20 MG capsule TAKE (1) CAPSULE DAILY AS NEEDED. 90 capsule 3  . simvastatin (ZOCOR) 80 MG tablet TAKE 1 TABLET AT BEDTIME. 30 tablet 10  . venlafaxine (EFFEXOR) 37.5 MG tablet TAKE (1/2) TABLET TWICE DAILY 30 tablet 3  . VENTOLIN HFA 108 (90 BASE) MCG/ACT inhaler INHALE 2 PUFFS EVERY 4 HOURS AS NEEDED. 18 g 5  . [DISCONTINUED] omeprazole (PRILOSEC OTC) 20 MG tablet Take 1 tablet (20 mg total) by mouth daily as needed. 90 tablet 1   No current facility-administered medications on file prior to visit.    Allergies  Allergen Reactions  . Doxycycline     REACTION: thrush    Past Medical History  Diagnosis Date  . COPD (chronic obstructive pulmonary disease) (HCC)     no change with spiriva, poor tolerance of advair  . Routine general medical examination at a health care facility   . GERD (gastroesophageal reflux disease)   . Hyperlipidemia   . Fibromyalgia   . Fatigue   . Depressive disorder, not elsewhere classified   . Diabetes mellitus     Past Surgical History  Procedure Laterality Date  . Abdominal hysterectomy      partial  . Cataract extraction Left 02/09/2015  . Cataract extraction Right 01/26/2015    Family History  Problem Relation Age of Onset  . Stroke Father 71  . Diabetes Father   . Asthma Father   . Liver cancer Sister 18  . Pancreatic cancer Sister 59  . Brain cancer Brother   . Diabetes Sister   . Diabetes Sister   . Diabetes Sister   . Diabetes Sister   . Diabetes Sister   . Diabetes Sister   . Diabetes Sister   . Diabetes Paternal Grandmother     Social History   Social History  . Marital Status: Married    Spouse Name: N/A  . Number of Children: 2  . Years of Education: N/A   Occupational History  . retired     [employer]   Social History Main  Topics  . Smoking status: Former Smoker -- 1.00 packs/day for 50 years    Quit date: 06/06/2010  . Smokeless tobacco: Never Used  . Alcohol Use: No  . Drug Use: No  . Sexual Activity: No   Other Topics Concern  . Not on file   Social History Narrative   Children aged 32, and 59, expecting 2nd grandchild   The PMH, PSH, Social History, Family History, Medications, and allergies have been reviewed in [facility], and have been updated if relevant.  Review of Systems  Musculoskeletal: Positive for arthralgias.  Skin: Negative.   All other systems reviewed and are negative.      Objective:    BP 136/72 mmHg  Pulse 90  Temp(Src) 97.9 F (36.6 C) (Oral)  Wt 143 lb (64.864 kg)  SpO2 97%   Physical Exam  Constitutional: She appears well-developed and well-nourished. No distress.  HENT:  Head: Normocephalic.  Eyes: Conjunctivae are normal.  Cardiovascular: Normal rate.   Pulmonary/Chest: Effort normal.  Musculoskeletal: Normal range of motion.  Skin: Skin is warm and dry. She is not diaphoretic.  Psychiatric: She has a normal mood and  affect. Her behavior is normal. Judgment and thought content normal.  Nursing note and vitals reviewed.         Assessment & Plan:   Osteoporosis No Follow-up on file.

## 2015-10-05 NOTE — Assessment & Plan Note (Addendum)
>  15 minutes spent in face to face time with patient, >50% spent in counselling or coordination of care Start fosamax weekly. Increase calcium and vitamin D.

## 2015-10-05 NOTE — Progress Notes (Signed)
Pre visit review using our clinic review tool, if applicable. No additional management support is needed unless otherwise documented below in the visit note. 

## 2015-10-12 ENCOUNTER — Other Ambulatory Visit: Payer: Self-pay | Admitting: Family Medicine

## 2015-10-12 DIAGNOSIS — E11319 Type 2 diabetes mellitus with unspecified diabetic retinopathy without macular edema: Secondary | ICD-10-CM | POA: Diagnosis not present

## 2015-10-13 NOTE — Telephone Encounter (Signed)
Last A1C abnormal. pls advise 

## 2015-10-27 ENCOUNTER — Other Ambulatory Visit: Payer: Self-pay | Admitting: Family Medicine

## 2015-10-30 ENCOUNTER — Ambulatory Visit: Payer: BLUE CROSS/BLUE SHIELD | Admitting: Family Medicine

## 2015-10-30 DIAGNOSIS — M1711 Unilateral primary osteoarthritis, right knee: Secondary | ICD-10-CM | POA: Diagnosis not present

## 2015-11-02 ENCOUNTER — Other Ambulatory Visit: Payer: Self-pay

## 2015-11-02 MED ORDER — ALPRAZOLAM 0.5 MG PO TABS: ORAL_TABLET | ORAL | Status: DC

## 2015-11-02 NOTE — Addendum Note (Signed)
Addended by: Desmond DikeKNIGHT, Karmen Altamirano H on: 11/02/2015 04:58 PM   Modules accepted: Orders

## 2015-11-02 NOTE — Telephone Encounter (Signed)
Rx called in to requested pharmacy 

## 2015-11-02 NOTE — Telephone Encounter (Signed)
Pt left v/m requesting refill alprazolam to [facility] ASAP. Last refilled # 60 x 1 on 08/03/15. Pt last seen 10/05/15. Pt request cb when refilled.

## 2015-11-03 DIAGNOSIS — M1711 Unilateral primary osteoarthritis, right knee: Secondary | ICD-10-CM | POA: Diagnosis not present

## 2015-11-05 ENCOUNTER — Encounter: Payer: Self-pay | Admitting: Family Medicine

## 2015-11-05 ENCOUNTER — Ambulatory Visit (INDEPENDENT_AMBULATORY_CARE_PROVIDER_SITE_OTHER): Payer: BLUE CROSS/BLUE SHIELD | Admitting: Family Medicine

## 2015-11-05 VITALS — BP 118/60 | HR 84 | Temp 98.8°F | Ht 64.75 in | Wt 141.5 lb

## 2015-11-05 DIAGNOSIS — M25561 Pain in right knee: Secondary | ICD-10-CM | POA: Diagnosis not present

## 2015-11-05 NOTE — Progress Notes (Signed)
Dr. Karleen HampshireSpencer T. Haliegh Khurana, MD, CAQ Sports Medicine Primary Care and Sports Medicine [address] [place], [address] Phone: [phone] Fax: [phone]  11/05/2015  Patient: Sara BastosCatherine B Stafford, MRN: [id number], DOB: 1945-06-14, 70 y.o.  Primary Physician:  Ruthe Mannanalia Aron, MD   Chief Complaint  Patient presents with  . Knee Pain    [facility]en Dr Cleophas DunkerBassett 6/5 & Dr Farris HasKramer 6/7-MRI scheduled for Monday   Subjective:   Sara BastosCatherine B Stafford is a 70 y.o. very pleasant female patient who presents with the following:  [facility]. Hit really hard. Tailbone was ok, then knee was really bothering her.   She actually Arty saw both Dr. Cleophas DunkerBassett and then later Dr. Farris HasKramer last week.  She wanted to come in and see what I thought of her knee as well.  She is been in relatively debilitating pain.  Dr. Cleophas DunkerBassett did a intra-articular injection which did not really seem to help all that much.  She is still having quite a bit of pain and is limping quite a bit.  This is after her fall last month.  Past Medical History, Surgical History, Social History, Family History, Problem List, Medications, and Allergies have been reviewed and updated if relevant.  Patient Active Problem List   Diagnosis Date Noted  . Osteoporosis 10/05/2015  . Acute upper respiratory infection 08/05/2015  . Macular degeneration 06/02/2014  . MAMMOGRAM, ABNORMAL, LEFT 09/10/2009  . Pulmonary nodules 03/20/2009  . ANEMIAS DUE TO DISORDERS GLUTATHIONE METABOLISM 07/31/2008  . COPD GOLD II  10/11/2007  . Diabetes (HCC) 06/04/2007  . HYPERLIPIDEMIA 06/04/2007  . Depression 06/04/2007  . GERD 06/04/2007  . FIBROMYALGIA 06/04/2007    Past Medical History  Diagnosis Date  . COPD (chronic obstructive pulmonary disease) (HCC)     no change with spiriva, poor tolerance of advair  . Routine general medical examination at a health care facility   . GERD (gastroesophageal reflux  disease)   . Hyperlipidemia   . Fibromyalgia   . Fatigue   . Depressive disorder, not elsewhere classified   . Diabetes mellitus     Past Surgical History  Procedure Laterality Date  . Abdominal hysterectomy      partial  . Cataract extraction Left 02/09/2015  . Cataract extraction Right 01/26/2015    Social History   Social History  . Marital Status: Married    Spouse Name: N/A  . Number of Children: 2  . Years of Education: N/A   Occupational History  . retired     [employer]   Social History Main Topics  . Smoking status: Former Smoker -- 1.00 packs/day for 50 years    Quit date: 06/06/2010  . Smokeless tobacco: Never Used  . Alcohol Use: No  . Drug Use: No  . Sexual Activity: No   Other Topics Concern  . Not on file   Social History Narrative   Children aged 72, and 3935, expecting 2nd grandchild    Family History  Problem Relation Age of Onset  . Stroke Father 7680  . Diabetes Father   . Asthma Father   . Liver cancer Sister 6781  . Pancreatic cancer Sister 7372  . Brain cancer Brother   . Diabetes Sister   . Diabetes Sister   . Diabetes Sister   . Diabetes Sister   . Diabetes Sister   . Diabetes Sister   . Diabetes Sister   . Diabetes Paternal Grandmother  Allergies  Allergen Reactions  . Doxycycline     REACTION: thrush    Medication list reviewed and updated in full in [facility].  GEN: No fevers, chills. Nontoxic. Primarily MSK c/o today. MSK: Detailed in the HPI GI: tolerating PO intake without difficulty Neuro: No numbness, parasthesias, or tingling associated. Otherwise the pertinent positives of the ROS are noted above.   Objective:   BP 118/60 mmHg  Pulse 84  Temp(Src) 98.8 F (37.1 C) (Oral)  Ht 5' 4.75" (1.645 m)  Wt 141 lb 8 oz (64.184 kg)  BMI 23.72 kg/m2   GEN: WDWN, NAD, Non-toxic, Alert & Oriented x 3 HEENT: Atraumatic, Normocephalic.  Ears and Nose: No external deformity. EXTR: No  clubbing/cyanosis/edema NEURO: Normal gait.  PSYCH: Normally interactive. Conversant. Not depressed or anxious appearing.  Calm demeanor.   Knee:  R Gait: Normal heel toe pattern ROM: 0-120 Effusion: neg Echymosis or edema: none Patellar tendon NT Painful PLICA: neg Patellar grind: negative Medial and lateral patellar facet loading: negative medial and lateral joint lines: medial Mcmurray's pos for pain only Flexion-pinch ttp Varus and valgus stress: stable Lachman: neg Ant and Post drawer: neg Hip abduction, IR, ER: WNL Hip flexion str: 5/5 Hip abd: 5/5 Quad: 5/5 VMO atrophy:No Hamstring concentric and eccentric: 5/5   Radiology:  Assessment and Plan:   Right knee pain  I completely agree with Dr. Cleophas Dunker and Dr. Farris Has.  Limited improvement despite all conservative measures and intermittent severe pain.  At this point agreed that I would proceed with an MRI, which she has scheduled on Monday.  Follow-up: No Follow-up on file.  Signed,  Elpidio Galea. Hartley Wyke, MD   Patient's Medications  New Prescriptions   No medications on file  Previous Medications   ALENDRONATE (FOSAMAX) 70 MG TABLET    Take 1 tablet (70 mg total) by mouth every 7 (seven) days. Take with a full glass of water on an empty stomach.   ALPRAZOLAM (XANAX) 0.5 MG TABLET    TAKE ONE-HALF TABLET BY MOUTH TWICE DAILY   ASPIRIN 81 MG TABLET    Take 81 mg by mouth as needed.     CETIRIZINE (ZYRTEC) 10 MG TABLET    TAKE 1 TABLET ONCE DAILY   DICLOFENAC SODIUM (VOLTAREN) 1 % GEL    Apply 4 g topically 4 (four) times daily.   GLIPIZIDE (GLUCOTROL XL) 10 MG 24 HR TABLET    TAKE (2) TABLETS BY MOUTH ONCE DAILY.   JANUVIA 50 MG TABLET    TAKE 1 TABLET ONCE DAILY   METFORMIN (GLUCOPHAGE) 1000 MG TABLET    TAKE 1 TABLET TWICE DAILY WITH MEALS   MOMETASONE-FORMOTEROL (DULERA) 100-5 MCG/ACT AERO    INHALE 2 PUFFS TWICE DAILY.   MONTELUKAST (SINGULAIR) 10 MG TABLET    TAKE 1 TABLET AT BEDTIME.   OMEPRAZOLE  (PRILOSEC) 20 MG CAPSULE    TAKE (1) CAPSULE DAILY AS NEEDED.   SIMVASTATIN (ZOCOR) 80 MG TABLET    TAKE 1 TABLET AT BEDTIME.   TRAMADOL (ULTRAM) 50 MG TABLET    Take 50 mg by mouth every 6 (six) hours as needed.    VENLAFAXINE (EFFEXOR) 37.5 MG TABLET    TAKE (1/2) TABLET TWICE DAILY   VENTOLIN HFA 108 (90 BASE) MCG/ACT INHALER    INHALE 2 PUFFS EVERY 4 HOURS AS NEEDED.  Modified Medications   No medications on file  Discontinued Medications   No medications on file

## 2015-11-05 NOTE — Progress Notes (Signed)
Pre visit review using our clinic review tool, if applicable. No additional management support is needed unless otherwise documented below in the visit note. 

## 2015-11-09 ENCOUNTER — Other Ambulatory Visit: Payer: Self-pay

## 2015-11-09 DIAGNOSIS — M25561 Pain in right knee: Secondary | ICD-10-CM | POA: Diagnosis not present

## 2015-11-09 MED ORDER — TRAMADOL HCL 50 MG PO TABS: 50.0000 mg | ORAL_TABLET | Freq: Four times a day (QID) | ORAL | Status: DC | PRN

## 2015-11-09 NOTE — Telephone Encounter (Signed)
Dr Patsy Lageropland printed tramadol rx and Burna MortimerWanda at front desk had pt sign for prescription.pt appreciative.

## 2015-11-09 NOTE — Telephone Encounter (Signed)
Pt walked in and will wait on tramadol rx. Pt had MRI this morning and rt knee is hurting. Pt is out of pain med. Dr Cleophas DunkerBassett gave Tramadol # 20 on 11/02/15. Pt saw Dr Patsy Lageropland on 11/05/15.[facility] to get tramadol rx.Dr Cleophas DunkerBassett is out of office until later this week.Please advise.

## 2015-11-10 DIAGNOSIS — M2040 Other hammer toe(s) (acquired), unspecified foot: Secondary | ICD-10-CM | POA: Diagnosis not present

## 2015-11-10 DIAGNOSIS — E119 Type 2 diabetes mellitus without complications: Secondary | ICD-10-CM | POA: Diagnosis not present

## 2015-11-12 ENCOUNTER — Other Ambulatory Visit: Payer: Self-pay | Admitting: Family Medicine

## 2015-11-13 NOTE — Telephone Encounter (Signed)
pts last labs abnormal. pls advise 

## 2015-11-16 ENCOUNTER — Other Ambulatory Visit: Payer: Self-pay | Admitting: Family Medicine

## 2015-11-25 ENCOUNTER — Other Ambulatory Visit: Payer: Self-pay | Admitting: Family Medicine

## 2015-11-25 NOTE — Telephone Encounter (Signed)
Last lab abnormal. pls advise 

## 2015-12-03 ENCOUNTER — Telehealth: Payer: Self-pay | Admitting: *Deleted

## 2015-12-03 MED ORDER — ALPRAZOLAM 0.5 MG PO TABS: ORAL_TABLET | ORAL | Status: DC

## 2015-12-03 NOTE — Telephone Encounter (Signed)
Pt called very upset that Xanex refill was not addressed and she said she called it in to [facility] on Monday.  We received the request this morning at 10:12am.  Pharmacy called back to say that this was second request.  Pt understands that [facility] will call in early this afternoon as [facility] [facility] closes at 5 pm.

## 2015-12-03 NOTE — Telephone Encounter (Signed)
Rx request was received but as pt has been advised, we have 2 business days to fill all medications. Pt has also been advised not to go to the pharmacy until she has received notification that is there and available for pickup

## 2015-12-03 NOTE — Telephone Encounter (Signed)
Rx called in to requested pharmacy 

## 2015-12-03 NOTE — Telephone Encounter (Signed)
Last f/u 07/2015-CPE 

## 2015-12-10 ENCOUNTER — Other Ambulatory Visit: Payer: Self-pay | Admitting: Family Medicine

## 2015-12-22 ENCOUNTER — Other Ambulatory Visit: Payer: Self-pay | Admitting: Family Medicine

## 2015-12-22 NOTE — Telephone Encounter (Signed)
Last labs 07/2015-abnormal. pls advise

## 2015-12-28 ENCOUNTER — Other Ambulatory Visit: Payer: Self-pay | Admitting: Family Medicine

## 2015-12-28 NOTE — Telephone Encounter (Signed)
Last A1C abnormal. pls advise 

## 2016-01-01 ENCOUNTER — Other Ambulatory Visit: Payer: Self-pay | Admitting: *Deleted

## 2016-01-01 MED ORDER — ALPRAZOLAM 0.5 MG PO TABS: ORAL_TABLET | ORAL | 0 refills | Status: DC

## 2016-01-01 NOTE — Telephone Encounter (Signed)
Attempted to call in Rx; pharmacy line not answered

## 2016-01-01 NOTE — Telephone Encounter (Signed)
Rx called in to requested pharmacy 

## 2016-01-01 NOTE — Telephone Encounter (Signed)
Last f/u 07/2015-CPE 

## 2016-01-05 ENCOUNTER — Ambulatory Visit (INDEPENDENT_AMBULATORY_CARE_PROVIDER_SITE_OTHER): Payer: BLUE CROSS/BLUE SHIELD | Admitting: Family Medicine

## 2016-01-05 ENCOUNTER — Encounter: Payer: Self-pay | Admitting: Family Medicine

## 2016-01-05 VITALS — BP 128/64 | HR 100 | Temp 97.9°F | Wt 141.8 lb

## 2016-01-05 DIAGNOSIS — E782 Mixed hyperlipidemia: Secondary | ICD-10-CM | POA: Diagnosis not present

## 2016-01-05 DIAGNOSIS — E119 Type 2 diabetes mellitus without complications: Secondary | ICD-10-CM

## 2016-01-05 LAB — HEMOGLOBIN A1C: HEMOGLOBIN A1C: 7.4 % — AB (ref 4.6–6.5)

## 2016-01-05 MED ORDER — ALPRAZOLAM 0.5 MG PO TABS: ORAL_TABLET | ORAL | 1 refills | Status: DC

## 2016-01-05 NOTE — Progress Notes (Signed)
Subjective:   Patient ID: Sara BastosCatherine B Basques, female    DOB: 03-18-1946, 70 y.o.   MRN: [id number]  Sara Stafford is a pleasant 70 y.o. year old female who presents to clinic today with Follow-up (DM)  on 01/05/2016  HPI:  DM- reasonable. control  On Metformin 1000 mg twice daily and glipizide 10 mg daily.    Denies any episodes of hypoglycemia.  Admits to not checking FSBS on regular basis.  Has been to a diabetic educator and says she knows what to do..  She is compliant with her medications.  Refused endo referral previously- wanted to work on diet. Negative urine microalbumin on 07/24/15.    Lab Results  Component Value Date   HGBA1C 7.2 (H) 07/24/2015   HLD- taking simvastatin 80 mg daily.  LDL has been at goal for a diabetic.   Lab Results  Component Value Date   CHOL 153 07/24/2015   HDL 42.50 07/24/2015   LDLCALC 78 07/24/2015   LDLDIRECT 80.8 10/24/2012   TRIG 161.0 (H) 07/24/2015   CHOLHDL 4 07/24/2015   Lab Results  Component Value Date   NA 137 07/24/2015   K 4.5 07/24/2015   CL 99 07/24/2015   CO2 30 07/24/2015   Current Outpatient Prescriptions on File Prior to Visit  Medication Sig Dispense Refill  . alendronate (FOSAMAX) 70 MG tablet Take 1 tablet (70 mg total) by mouth every 7 (seven) days. Take with a full glass of water on an empty stomach. 4 tablet 11  . ALPRAZolam (XANAX) 0.5 MG tablet TAKE ONE-HALF TABLET BY MOUTH TWICE DAILY 30 tablet 0  . aspirin 81 MG tablet Take 81 mg by mouth as needed.      . cetirizine (ZYRTEC) 10 MG tablet TAKE 1 TABLET ONCE DAILY 30 tablet 11  . diclofenac sodium (VOLTAREN) 1 % GEL Apply 4 g topically 4 (four) times daily. 100 g 0  . DULERA 100-5 MCG/ACT AERO INHALE 2 PUFFS TWICE DAILY. 13 g 0  . GLIPIZIDE XL 10 MG 24 hr tablet TAKE (2) TABLETS BY MOUTH ONCE DAILY. 60 tablet 0  . JANUVIA 50 MG tablet TAKE 1 TABLET ONCE DAILY 30 tablet 0  . metFORMIN (GLUCOPHAGE) 1000 MG tablet TAKE 1 TABLET TWICE DAILY WITH MEALS  60 tablet 0  . montelukast (SINGULAIR) 10 MG tablet TAKE 1 TABLET AT BEDTIME. 30 tablet 10  . omeprazole (PRILOSEC) 20 MG capsule TAKE (1) CAPSULE DAILY AS NEEDED. 90 capsule 3  . simvastatin (ZOCOR) 80 MG tablet TAKE 1 TABLET AT BEDTIME. 30 tablet 10  . traMADol (ULTRAM) 50 MG tablet Take 1 tablet (50 mg total) by mouth every 6 (six) hours as needed. 50 tablet 1  . venlafaxine (EFFEXOR) 37.5 MG tablet TAKE (1/2) TABLET TWICE DAILY 30 tablet 3  . VENTOLIN HFA 108 (90 Base) MCG/ACT inhaler USE 2 PUFFS EVERY 4 HOURS AS NEEDED 18 g 0  . [DISCONTINUED] omeprazole (PRILOSEC OTC) 20 MG tablet Take 1 tablet (20 mg total) by mouth daily as needed. 90 tablet 1   No current facility-administered medications on file prior to visit.     Allergies  Allergen Reactions  . Doxycycline     REACTION: thrush    Past Medical History:  Diagnosis Date  . COPD (chronic obstructive pulmonary disease) (HCC)    no change with spiriva, poor tolerance of advair  . Depressive disorder, not elsewhere classified   . Diabetes mellitus   . Fatigue   . Fibromyalgia   .  GERD (gastroesophageal reflux disease)   . Hyperlipidemia   . Routine general medical examination at a health care facility     Past Surgical History:  Procedure Laterality Date  . ABDOMINAL HYSTERECTOMY     partial  . CATARACT EXTRACTION Left 02/09/2015  . CATARACT EXTRACTION Right 01/26/2015    Family History  Problem Relation Age of Onset  . Stroke Father 65  . Diabetes Father   . Asthma Father   . Liver cancer Sister 29  . Pancreatic cancer Sister 83  . Brain cancer Brother   . Diabetes Sister   . Diabetes Sister   . Diabetes Sister   . Diabetes Sister   . Diabetes Sister   . Diabetes Sister   . Diabetes Sister   . Diabetes Paternal Grandmother     Social History   Social History  . Marital status: Married    Spouse name: N/A  . Number of children: 2  . Years of education: N/A   Occupational History  . retired Retired     Actuary   Social History Main Topics  . Smoking status: Former Smoker    Packs/day: 1.00    Years: 50.00    Quit date: 06/06/2010  . Smokeless tobacco: Never Used  . Alcohol use No  . Drug use: No  . Sexual activity: No   Other Topics Concern  . Not on file   Social History Narrative   Children aged 47, and 2, expecting 2nd grandchild   The PMH, PSH, Social History, Family History, Medications, and allergies have been reviewed in [facility], and have been updated if relevant.   Review of Systems  Constitutional: Negative.   HENT: Negative.   Respiratory: Negative.   Cardiovascular: Negative.   Gastrointestinal: Negative.   Endocrine: Negative.   Genitourinary: Negative.   Musculoskeletal: Negative.   Neurological: Negative.   Hematological: Negative.   Psychiatric/Behavioral: Negative.   All other systems reviewed and are negative.      Objective:    BP 128/64   Pulse 100   Temp 97.9 F (36.6 C) (Oral)   Wt 141 lb 12 oz (64.3 kg)   SpO2 97%   BMI 23.77 kg/m    Physical Exam  General:  Well-developed,well-nourished,in no acute distress; alert,appropriate and cooperative throughout examination Head:  normocephalic and atraumatic.   Mouth:  good dentition.   Neck:  No deformities, masses, or tenderness noted. Lungs:  Normal respiratory effort, chest expands symmetrically. Lungs are clear to auscultation, no crackles or wheezes. Heart:  Normal rate and regular rhythm. S1 and S2 normal without gallop, murmur, click, rub or other extra sounds. Msk:  No deformity or scoliosis noted of thoracic or lumbar spine.   Extremities:  No clubbing, cyanosis, edema, or deformity noted with normal full range of motion of all joints.   Neurologic:  alert & oriented X3 and gait normal.   Skin:  Intact without suspicious lesions or rashes Cervical Nodes:  No lymphadenopathy noted Axillary Nodes:  No palpable lymphadenopathy Psych:  Cognition and judgment appear intact. Alert  and cooperative with normal attention span and concentration. No apparent delusions, illusions, hallucinations        Assessment & Plan:   HYPERLIPIDEMIA  Type 2 diabetes mellitus without complication, without long-term current use of insulin (HCC) - Plan: Hemoglobin A1c No Follow-up on file.

## 2016-01-05 NOTE — Progress Notes (Signed)
Pre visit review using our clinic review tool, if applicable. No additional management support is needed unless otherwise documented below in the visit note. 

## 2016-01-05 NOTE — Assessment & Plan Note (Signed)
Due for a1c. Will recheck today. No changes made to rxs today.

## 2016-01-05 NOTE — Assessment & Plan Note (Signed)
Continue statin. No changes made to rxs today.

## 2016-01-05 NOTE — Patient Instructions (Signed)
Great to see you. I will call you with your lab results.   

## 2016-01-12 ENCOUNTER — Other Ambulatory Visit: Payer: Self-pay | Admitting: Family Medicine

## 2016-01-18 ENCOUNTER — Other Ambulatory Visit: Payer: Self-pay | Admitting: Family Medicine

## 2016-01-27 ENCOUNTER — Other Ambulatory Visit: Payer: Self-pay | Admitting: Family Medicine

## 2016-02-10 ENCOUNTER — Other Ambulatory Visit: Payer: Self-pay | Admitting: Family Medicine

## 2016-02-17 ENCOUNTER — Other Ambulatory Visit: Payer: Self-pay | Admitting: Family Medicine

## 2016-02-18 ENCOUNTER — Telehealth: Payer: Self-pay | Admitting: Family Medicine

## 2016-02-18 NOTE — Telephone Encounter (Signed)
Pt called to let us know that she is changing pharmacies.  She will now be using [facility] and their number is [phone].  Can you please update her chart accordingly.  Thanks!

## 2016-02-18 NOTE — Telephone Encounter (Signed)
Please see below.

## 2016-02-19 ENCOUNTER — Other Ambulatory Visit: Payer: Self-pay

## 2016-02-19 MED ORDER — METFORMIN HCL 1000 MG PO TABS: 1000.0000 mg | ORAL_TABLET | Freq: Two times a day (BID) | ORAL | 5 refills | Status: DC

## 2016-02-19 NOTE — Telephone Encounter (Signed)
Pharmacy updated as requested.

## 2016-02-19 NOTE — Telephone Encounter (Signed)
Pt left v/m requestinig refill metformin to [facility]. Pt seen 01/05/16; refilled per protocol.pt did not want cb.

## 2016-02-25 ENCOUNTER — Other Ambulatory Visit: Payer: Self-pay | Admitting: Family Medicine

## 2016-03-04 ENCOUNTER — Other Ambulatory Visit: Payer: Self-pay

## 2016-03-04 ENCOUNTER — Other Ambulatory Visit: Payer: Self-pay | Admitting: Family Medicine

## 2016-03-04 MED ORDER — ALPRAZOLAM 0.5 MG PO TABS: ORAL_TABLET | ORAL | 0 refills | Status: DC

## 2016-03-04 NOTE — Telephone Encounter (Signed)
Ok to phone in Xanax 

## 2016-03-04 NOTE — Telephone Encounter (Signed)
Last filled on 01/05/16 #30 +1, last OV 01/05/16. OK to refill?

## 2016-03-04 NOTE — Telephone Encounter (Signed)
Rx called in to requested pharmacy 

## 2016-03-07 ENCOUNTER — Other Ambulatory Visit: Payer: Self-pay

## 2016-03-07 MED ORDER — MOMETASONE FURO-FORMOTEROL FUM 100-5 MCG/ACT IN AERO: 2.0000 | INHALATION_SPRAY | Freq: Two times a day (BID) | RESPIRATORY_TRACT | 0 refills | Status: DC

## 2016-03-07 MED ORDER — OMEPRAZOLE 20 MG PO CPDR: DELAYED_RELEASE_CAPSULE | ORAL | 1 refills | Status: DC

## 2016-03-07 NOTE — Telephone Encounter (Signed)
Pt request refill dulera and omeprazole. Sp[facility] with Thur[facility] and he did not get transfer from [facility] for omeprazole. Refill done per protocol; last seen 01/05/16. [facility] and pt taking abx that has helped her cold. Pt will cb for appt if not better.

## 2016-03-08 ENCOUNTER — Other Ambulatory Visit: Payer: Self-pay | Admitting: *Deleted

## 2016-03-08 MED ORDER — MOMETASONE FURO-FORMOTEROL FUM 100-5 MCG/ACT IN AERO: 2.0000 | INHALATION_SPRAY | Freq: Two times a day (BID) | RESPIRATORY_TRACT | 12 refills | Status: DC

## 2016-04-26 ENCOUNTER — Other Ambulatory Visit: Payer: Self-pay | Admitting: Family Medicine

## 2016-04-26 DIAGNOSIS — Z23 Encounter for immunization: Secondary | ICD-10-CM | POA: Diagnosis not present

## 2016-05-03 ENCOUNTER — Other Ambulatory Visit: Payer: Self-pay

## 2016-05-03 MED ORDER — ALPRAZOLAM 0.5 MG PO TABS: ORAL_TABLET | ORAL | 0 refills | Status: DC

## 2016-05-03 NOTE — Telephone Encounter (Signed)
Pt called to ck on refill of alprazolam that was supposed to be faxed to Dr Dayton MartesAron. Pt has been to pharmacy x 3 this week. Pt request cb when refilled. Pt had BS this afternoon of 317. Pt wants to know if needs any blood test done. Last Hbg A1C 01/05/16.

## 2016-05-03 NOTE — Telephone Encounter (Signed)
[facility] left v/m requesting refill xanax. Last refilled # 30 on 03/04/16. Pt last seen 01/05/16.

## 2016-05-03 NOTE — Telephone Encounter (Signed)
Yes I would like to check an a1c and please help her with this refill request.

## 2016-05-04 NOTE — Telephone Encounter (Signed)
Spoke to pt and discussed refill, as we have done previous months. Advised pt that we have 2 business days to fill all medications. I explained to her that although she is "going to town" and going by the pharmacy because she is in the city, the medication may not be ready. Paper refill was received on 12/5.  I advised pt that she may make the request up to 3days ahead of her due date, to ensure that she does not run out of medication, within our refill window. Advised pt that if she calls in a refill, but does not hear anything within 2 business days, to contact me directly. Rx called in to pharmacy and f/u labs scheduled.

## 2016-05-04 NOTE — Telephone Encounter (Signed)
Spoke to pharmacist trey, who states pts refills are not on automatic refill. He states that he has suggested that previously, but pt never confirmed. We agreed to place pts xanax on automatic refill so that the request is received a few days early to prevent any delays.

## 2016-05-06 ENCOUNTER — Other Ambulatory Visit: Payer: BLUE CROSS/BLUE SHIELD

## 2016-05-10 ENCOUNTER — Other Ambulatory Visit: Payer: Self-pay | Admitting: Family Medicine

## 2016-05-10 DIAGNOSIS — E0800 Diabetes mellitus due to underlying condition with hyperosmolarity without nonketotic hyperglycemic-hyperosmolar coma (NKHHC): Secondary | ICD-10-CM

## 2016-05-11 ENCOUNTER — Other Ambulatory Visit: Payer: Self-pay | Admitting: Family Medicine

## 2016-05-16 ENCOUNTER — Other Ambulatory Visit (INDEPENDENT_AMBULATORY_CARE_PROVIDER_SITE_OTHER): Payer: BLUE CROSS/BLUE SHIELD

## 2016-05-16 DIAGNOSIS — E0800 Diabetes mellitus due to underlying condition with hyperosmolarity without nonketotic hyperglycemic-hyperosmolar coma (NKHHC): Secondary | ICD-10-CM | POA: Diagnosis not present

## 2016-05-16 LAB — COMPREHENSIVE METABOLIC PANEL
ALBUMIN: 4.4 g/dL (ref 3.5–5.2)
ALK PHOS: 61 U/L (ref 39–117)
ALT: 14 U/L (ref 0–35)
AST: 17 U/L (ref 0–37)
BUN: 11 mg/dL (ref 6–23)
CO2: 29 mEq/L (ref 19–32)
Calcium: 9.2 mg/dL (ref 8.4–10.5)
Chloride: 101 mEq/L (ref 96–112)
Creatinine, Ser: 0.67 mg/dL (ref 0.40–1.20)
GFR: 91.83 mL/min (ref >60.00)
Glucose, Bld: 129 mg/dL — ABNORMAL HIGH (ref 70–99)
POTASSIUM: 4.4 meq/L (ref 3.5–5.1)
SODIUM: 139 meq/L (ref 135–145)
TOTAL PROTEIN: 6.9 g/dL (ref 6.0–8.3)
Total Bilirubin: 0.5 mg/dL (ref 0.2–1.2)

## 2016-05-16 LAB — HEMOGLOBIN A1C: Hgb A1c MFr Bld: 7.6 % — ABNORMAL HIGH (ref 4.6–6.5)

## 2016-05-16 LAB — LIPID PANEL
CHOL/HDL RATIO: 4
Cholesterol: 154 mg/dL (ref 0–200)
HDL: 43.2 mg/dL (ref >39.00)
LDL CALC: 73 mg/dL (ref 0–99)
NONHDL: 111.06
Triglycerides: 189 mg/dL — ABNORMAL HIGH (ref 0.0–149.0)
VLDL: 37.8 mg/dL (ref 0.0–40.0)

## 2016-05-18 ENCOUNTER — Encounter: Payer: Self-pay | Admitting: *Deleted

## 2016-05-31 ENCOUNTER — Other Ambulatory Visit: Payer: Self-pay | Admitting: *Deleted

## 2016-05-31 MED ORDER — ALPRAZOLAM 0.5 MG PO TABS: ORAL_TABLET | ORAL | 0 refills | Status: DC

## 2016-05-31 NOTE — Telephone Encounter (Signed)
Rx called in to requested pharmacy 

## 2016-05-31 NOTE — Telephone Encounter (Signed)
Last f/u 07/2015-CPE 

## 2016-06-06 ENCOUNTER — Telehealth: Payer: Self-pay

## 2016-06-06 NOTE — Telephone Encounter (Signed)
Pt left v/m requesting status [facility]. [facility] request [facility]. I spoke with  [facility] and has sent couple request for [facility] via cover my meds. Cala BradfordKimberly will fax PA to [phone] today.

## 2016-06-06 NOTE — Telephone Encounter (Signed)
Form received and completed; awaiting response

## 2016-06-07 DIAGNOSIS — M2040 Other hammer toe(s) (acquired), unspecified foot: Secondary | ICD-10-CM | POA: Diagnosis not present

## 2016-06-07 DIAGNOSIS — E119 Type 2 diabetes mellitus without complications: Secondary | ICD-10-CM | POA: Diagnosis not present

## 2016-06-07 NOTE — Telephone Encounter (Signed)
Approval received for pts med valid through 05/29/2038. Copy faxed to pharmacy

## 2016-06-21 ENCOUNTER — Other Ambulatory Visit: Payer: Self-pay | Admitting: Family Medicine

## 2016-06-29 ENCOUNTER — Other Ambulatory Visit: Payer: Self-pay | Admitting: *Deleted

## 2016-06-29 DIAGNOSIS — J019 Acute sinusitis, unspecified: Secondary | ICD-10-CM | POA: Diagnosis not present

## 2016-06-29 DIAGNOSIS — J40 Bronchitis, not specified as acute or chronic: Secondary | ICD-10-CM | POA: Diagnosis not present

## 2016-06-29 MED ORDER — ALPRAZOLAM 0.5 MG PO TABS: ORAL_TABLET | ORAL | 0 refills | Status: DC

## 2016-06-29 NOTE — Telephone Encounter (Signed)
Last f/u 07/2015 

## 2016-06-30 NOTE — Telephone Encounter (Signed)
Rx called in to requested pharmacy 

## 2016-07-04 ENCOUNTER — Other Ambulatory Visit: Payer: Self-pay | Admitting: Family Medicine

## 2016-07-08 ENCOUNTER — Other Ambulatory Visit: Payer: Self-pay | Admitting: Family Medicine

## 2016-07-29 ENCOUNTER — Other Ambulatory Visit: Payer: Self-pay | Admitting: Family Medicine

## 2016-07-29 ENCOUNTER — Other Ambulatory Visit: Payer: Self-pay

## 2016-07-29 ENCOUNTER — Ambulatory Visit (INDEPENDENT_AMBULATORY_CARE_PROVIDER_SITE_OTHER): Payer: BLUE CROSS/BLUE SHIELD

## 2016-07-29 VITALS — BP 102/70 | HR 91 | Temp 98.4°F | Ht 64.5 in | Wt 142.5 lb

## 2016-07-29 DIAGNOSIS — Z01419 Encounter for gynecological examination (general) (routine) without abnormal findings: Secondary | ICD-10-CM | POA: Insufficient documentation

## 2016-07-29 DIAGNOSIS — E119 Type 2 diabetes mellitus without complications: Secondary | ICD-10-CM

## 2016-07-29 DIAGNOSIS — Z130 Encounter for screening for diseases of the blood and blood-forming organs and certain disorders involving the immune mechanism: Secondary | ICD-10-CM

## 2016-07-29 DIAGNOSIS — Z1159 Encounter for screening for other viral diseases: Secondary | ICD-10-CM | POA: Diagnosis not present

## 2016-07-29 DIAGNOSIS — E782 Mixed hyperlipidemia: Secondary | ICD-10-CM

## 2016-07-29 DIAGNOSIS — M810 Age-related osteoporosis without current pathological fracture: Secondary | ICD-10-CM

## 2016-07-29 DIAGNOSIS — E7849 Other hyperlipidemia: Secondary | ICD-10-CM

## 2016-07-29 DIAGNOSIS — E784 Other hyperlipidemia: Secondary | ICD-10-CM | POA: Diagnosis not present

## 2016-07-29 DIAGNOSIS — E0800 Diabetes mellitus due to underlying condition with hyperosmolarity without nonketotic hyperglycemic-hyperosmolar coma (NKHHC): Secondary | ICD-10-CM

## 2016-07-29 DIAGNOSIS — Z0000 Encounter for general adult medical examination without abnormal findings: Secondary | ICD-10-CM

## 2016-07-29 LAB — CBC WITH DIFFERENTIAL/PLATELET
Basophils Absolute: 0.1 10*3/uL (ref 0.0–0.1)
Basophils Relative: 0.8 % (ref 0.0–3.0)
EOS ABS: 0.1 10*3/uL (ref 0.0–0.7)
Eosinophils Relative: 1.4 % (ref 0.0–5.0)
HCT: 40.2 % (ref 36.0–46.0)
HEMOGLOBIN: 13.2 g/dL (ref 12.0–15.0)
Lymphocytes Relative: 25.3 % (ref 12.0–46.0)
Lymphs Abs: 1.7 10*3/uL (ref 0.7–4.0)
MCHC: 32.8 g/dL (ref 30.0–36.0)
MCV: 89.3 fl (ref 78.0–100.0)
MONO ABS: 0.4 10*3/uL (ref 0.1–1.0)
Monocytes Relative: 6.1 % (ref 3.0–12.0)
Neutro Abs: 4.4 10*3/uL (ref 1.4–7.7)
Neutrophils Relative %: 66.4 % (ref 43.0–77.0)
Platelets: 301 10*3/uL (ref 150.0–400.0)
RBC: 4.51 Mil/uL (ref 3.87–5.11)
RDW: 13.9 % (ref 11.5–15.5)
WBC: 6.6 10*3/uL (ref 4.0–10.5)

## 2016-07-29 LAB — VITAMIN D 25 HYDROXY (VIT D DEFICIENCY, FRACTURES): VITD: 21.48 ng/mL — ABNORMAL LOW (ref 30.00–100.00)

## 2016-07-29 LAB — MICROALBUMIN / CREATININE URINE RATIO
CREATININE, U: 27.3 mg/dL
MICROALB/CREAT RATIO: 2.6 mg/g (ref 0.0–30.0)

## 2016-07-29 LAB — TSH: TSH: 2.74 u[IU]/mL (ref 0.35–4.50)

## 2016-07-29 MED ORDER — ALPRAZOLAM 0.5 MG PO TABS: ORAL_TABLET | ORAL | 0 refills | Status: DC

## 2016-07-29 NOTE — Telephone Encounter (Signed)
Ok to phone in Xanax 

## 2016-07-29 NOTE — Progress Notes (Signed)
PCP notes:   Health maintenance:  Foot exam - PCP will complete at next appt Mammogram - addressed; pt plans to schedule future appt Fecal cologuard - video watched during ivisit on how to process specimen Tetanus - postponed/insurance Eye exam - per pt, exam in June 2017 Urine microalbumin - completed Hep C screening - completed Flu vaccine - per pt, administered in Oct 2017  Abnormal screenings:   Fall risk - hx of fall with injury  Patient concerns:   Patient wants to discuss swelling in both ankles. Onset: 1-2 weeks ago. Patient states she elevates her ankles but this has not been effective.   Patient [facility] to discuss an alternative to [facility] due to increased [facility]-pay.  Nurse concerns:  None  Next PCP appt:   08/03/16 @ 1215

## 2016-07-29 NOTE — Patient Instructions (Addendum)
**Please contact insurance regarding coverage for tetanus vaccine.    Ms. Sara Stafford , Thank you for taking time to come for your Medicare Wellness Visit. I appreciate your ongoing commitment to your health goals. Please review the following plan we discussed and let me know if I can assist you in the future.   These are the goals we discussed: Goals    . Eat more fruits and vegetables          Starting 07/29/15, I will decrease the amount of starchy foods and sweets during evening meal.       This is a list of the screening recommended for you and due dates:  Health Maintenance  Topic Date Due  . Complete foot exam   08/27/2016*  . Mammogram  07/29/2017*  . Cologuard (Stool DNA test)  08/27/2017*  . Tetanus Vaccine  07/30/2026*  . Eye exam for diabetics  10/28/2016  . Hemoglobin A1C  11/14/2016  . Urine Protein Check  07/29/2017  . Flu Shot  Addressed  . DEXA scan (bone density measurement)  Completed  .  Hepatitis C: One time screening is recommended by Center for Disease Control  (CDC) for  adults born from [birth year] through 1965.   Completed  . Pneumonia vaccines  Completed  *Topic was postponed. The date shown is not the original due date.   Preventive Care for Adults  A healthy lifestyle and preventive care can promote health and wellness. Preventive health guidelines for adults include the following key practices.  . A routine yearly physical is a good way to check with your health care provider about your health and preventive screening. It is a chance to share any concerns and updates on your health and to receive a thorough exam.  . Visit your dentist for a routine exam and preventive care every 6 months. Brush your teeth twice a day and floss once a day. Good oral hygiene prevents tooth decay and gum disease.  . The frequency of eye exams is based on your age, health, family medical history, use  of contact lenses, and other factors. Follow your health care provider's  ecommendations for frequency of eye exams.  . Eat a healthy diet. Foods like vegetables, fruits, whole grains, low-fat dairy products, and lean protein foods contain the nutrients you need without too many calories. Decrease your intake of foods high in solid fats, added sugars, and salt. Eat the right amount of calories for you. Get information about a proper diet from your health care provider, if necessary.  . Regular physical exercise is one of the most important things you can do for your health. Most adults should get at least 150 minutes of moderate-intensity exercise (any activity that increases your heart rate and causes you to sweat) each week. In addition, most adults need muscle-strengthening exercises on 2 or more days a week.  Silver Sneakers may be a benefit available to you. To determine eligibility, you may visit the website: www.silversneakers.com or contact program at [phone] Mon-Fri between 8AM-8PM.   . Maintain a healthy weight. The body mass index (BMI) is a screening tool to identify possible weight problems. It provides an estimate of body fat based on height and weight. Your health care provider can find your BMI and can help you achieve or maintain a healthy weight.   For adults 20 years and older: ? A BMI below 18.5 is considered underweight. ? A BMI of 18.5 to 24.9 is normal. ? A BMI of 25  to 29.9 is considered overweight. ? A BMI of 30 and above is considered obese.   . Maintain normal blood lipids and cholesterol levels by exercising and minimizing your intake of saturated fat. Eat a balanced diet with plenty of fruit and vegetables. Blood tests for lipids and cholesterol should begin at age 64 and be repeated every 5 years. If your lipid or cholesterol levels are high, you are over 50, or you are at high risk for heart disease, you may need your cholesterol levels checked more frequently. Ongoing high lipid and cholesterol levels should be treated with medicines  if diet and exercise are not working.  . If you smoke, find out from your health care provider how to quit. If you do not use tobacco, please do not start.  . If you choose to drink alcohol, please do not consume more than 2 drinks per day. One drink is considered to be 12 ounces (355 mL) of beer, 5 ounces (148 mL) of wine, or 1.5 ounces (44 mL) of liquor.  . If you are 22-27 years old, ask your health care provider if you should take aspirin to prevent strokes.  . Use sunscreen. Apply sunscreen liberally and repeatedly throughout the day. You should seek shade when your shadow is shorter than you. Protect yourself by wearing long sleeves, pants, a wide-brimmed hat, and sunglasses year round, whenever you are outdoors.  . Once a month, do a whole body skin exam, using a mirror to look at the skin on your back. Tell your health care provider of new moles, moles that have irregular borders, moles that are larger than a pencil eraser, or moles that have changed in shape or color.

## 2016-07-29 NOTE — Progress Notes (Signed)
Subjective:   Sara Stafford is a 71 y.o. female who presents for Medicare Annual (Subsequent) preventive examination.  Review of Systems:  N/A Cardiac Risk Factors include: advanced age (>6655men, 66>65 women);diabetes mellitus;dyslipidemia     Objective:     Vitals: BP 102/70 (BP Location: Right Arm, Patient Position: Sitting, Cuff Size: Normal)   Pulse 91   Temp 98.4 F (36.9 C) (Oral)   Ht 5' 4.5" (1.638 m) Comment: no shoes  Wt 142 lb 8 oz (64.6 kg)   SpO2 96%   BMI 24.08 kg/m   Body mass index is 24.08 kg/m.   Tobacco History  Smoking Status  . Former Smoker  . Packs/day: 1.00  . Years: 50.00  . Quit date: 06/06/2010  Smokeless Tobacco  . Never Used     Counseling given: No   Past Medical History:  Diagnosis Date  . COPD (chronic obstructive pulmonary disease) (HCC)    no change with spiriva, poor tolerance of advair  . Depressive disorder, not elsewhere classified   . Diabetes mellitus   . Fatigue   . Fibromyalgia   . GERD (gastroesophageal reflux disease)   . Hyperlipidemia   . Routine general medical examination at a health care facility    Past Surgical History:  Procedure Laterality Date  . ABDOMINAL HYSTERECTOMY     partial  . CATARACT EXTRACTION Left 02/09/2015  . CATARACT EXTRACTION Right 01/26/2015   Family History  Problem Relation Age of Onset  . Stroke Father 4080  . Diabetes Father   . Asthma Father   . Liver cancer Sister 1181  . Pancreatic cancer Sister 5372  . Brain cancer Brother   . Diabetes Sister   . Diabetes Sister   . Diabetes Sister   . Diabetes Sister   . Diabetes Sister   . Diabetes Sister   . Diabetes Sister   . Diabetes Paternal Grandmother    History  Sexual Activity  . Sexual activity: No    Outpatient Encounter Prescriptions as of 07/29/2016  Medication Sig  . alendronate (FOSAMAX) 70 MG tablet Take 1 tablet (70 mg total) by mouth every 7 (seven) days. Take with a full glass of water on an empty stomach.  .  ALPRAZolam (XANAX) 0.5 MG tablet TAKE ONE-HALF TABLET BY MOUTH TWICE DAILY  . aspirin 81 MG tablet Take 81 mg by mouth as needed.    . cetirizine (ZYRTEC) 10 MG tablet TAKE 1 TABLET BY MOUTH ONCE DAILY.  Marland Kitchen. diclofenac sodium (VOLTAREN) 1 % GEL Apply 4 g topically 4 (four) times daily.  Marland Kitchen. glipiZIDE (GLUCOTROL XL) 10 MG 24 hr tablet TAKE (2) TABLETS BY MOUTH ONCE DAILY.  Marland Kitchen. JANUVIA 50 MG tablet TAKE 1 TABLET BY MOUTH ONCE DAILY.  . metFORMIN (GLUCOPHAGE) 1000 MG tablet TAKE (1) TABLET TWICE A DAY WITH FOOD---BREAKFAST AND SUPPER.  . mometasone-formoterol (DULERA) 100-5 MCG/ACT AERO Inhale 2 puffs into the lungs 2 (two) times daily.  . montelukast (SINGULAIR) 10 MG tablet TAKE ONE TABLET BY MOUTH AT BEDTIME.  Marland Kitchen. omeprazole (PRILOSEC) 20 MG capsule TAKE (1) CAPSULE BY MOUTH ONCE DAILY AS NEEDED.  Marland Kitchen. simvastatin (ZOCOR) 80 MG tablet TAKE 1 TABLET AT BEDTIME.  . traMADol (ULTRAM) 50 MG tablet Take 1 tablet (50 mg total) by mouth every 6 (six) hours as needed.  . venlafaxine (EFFEXOR) 37.5 MG tablet TAKE (1/2) TABLET TWICE DAILY  . VENTOLIN HFA 108 (90 Base) MCG/ACT inhaler INHALE 2 PUFFS BY MOUTH EVERY 4 HOURS AS NEEDED  . [  DISCONTINUED] ALPRAZolam (XANAX) 0.5 MG tablet TAKE ONE-HALF TABLET BY MOUTH TWICE DAILY   No facility-administered encounter medications on file as of 07/29/2016.     Activities of Daily Living In your present state of health, do you have any difficulty performing the following activities: 07/29/2016  Hearing? Y  Vision? N  Difficulty concentrating or making decisions? N  Walking or climbing stairs? N  Dressing or bathing? N  Doing errands, shopping? N  Preparing Food and eating ? N  Using the Toilet? N  In the past six months, have you accidently leaked urine? N  Do you have problems with loss of bowel control? N  Managing your Medications? N  Managing your Finances? N  Housekeeping or managing your Housekeeping? N  Some recent data might be hidden    Patient Care  Team: Dianne Dun, MD as PCP - General (Family Medicine) Barbaraann Share, MD as Consulting Physician (Pulmonary Disease) Stephannie Li, MD as Consulting Physician (Ophthalmology) Nyoka Cowden, MD as Consulting Physician (Pulmonary Disease) Hannah Beat, MD as Consulting Physician (Family Medicine)    Assessment:     Hearing Screening   125Hz  250Hz  500Hz  1000Hz  2000Hz  3000Hz  4000Hz  6000Hz  8000Hz   Right ear:   40 40 40  40    Left ear:   40 40 40  40    Vision Screening Comments: Last vision exam in June 2017    Exercise Activities and Dietary recommendations Current Exercise Habits: The patient does not participate in regular exercise at present, Exercise limited by: None identified  Goals    . Eat more fruits and vegetables          Starting 07/29/15, I will decrease the amount of starchy foods and sweets during evening meal.      Fall Risk Fall Risk  07/29/2016 07/29/2015 06/02/2014  Falls in the past year? Yes No No  Number falls in past yr: 1 - -  Injury with Fall? Yes - -   Depression Screen PHQ 2/9 Scores 07/29/2016 07/29/2015 06/02/2014  PHQ - 2 Score 0 0 0     Cognitive Function MMSE - Mini Mental State Exam 07/29/2016 07/29/2015  Orientation to time 5 5  Orientation to Place 5 5  Registration 3 3  Attention/ Calculation 0 5  Recall 3 3  Language- name 2 objects 0 0  Language- repeat 1 1  Language- follow 3 step command 3 3  Language- read & follow direction 0 1  Write a sentence 0 0  Copy design 0 0  Total score 20 26     PLEASE NOTE: A Mini-Cog screen was completed. Maximum score is 20. A value of 0 denotes this part of Folstein MMSE was not completed or the patient failed this part of the Mini-Cog screening.   Mini-Cog Screening Orientation to Time - Max 5 pts Orientation to Place - Max 5 pts Registration - Max 3 pts Recall - Max 3 pts Language Repeat - Max 1 pts Language Follow 3 Step Command - Max 3 pts     Immunization History  Administered Date(s)  Administered  . Influenza Split 02/29/2012  . Influenza Whole 03/09/2010, 02/28/2011  . Influenza,inj,Quad PF,36+ Mos 03/19/2013, 03/02/2015  . Influenza-Unspecified 01/28/2014, 02/28/2016  . Pneumococcal Conjugate-13 03/03/2014  . Pneumococcal Polysaccharide-23 09/17/2009  . Zoster 10/13/2009   Screening Tests Health Maintenance  Topic Date Due  . FOOT EXAM  08/27/2016 (Originally 07/28/2016)  . MAMMOGRAM  07/29/2017 (Originally 02/07/2015)  . Fecal DNA (Cologuard)  08/27/2017 (Originally 11/18/1993)  . TETANUS/TDAP  07/30/2026 (Originally 11/19/1962)  . OPHTHALMOLOGY EXAM  10/28/2016  . HEMOGLOBIN A1C  11/14/2016  . URINE MICROALBUMIN  07/29/2017  . INFLUENZA VACCINE  Addressed  . DEXA SCAN  Completed  . Hepatitis C Screening  Completed  . PNA vac Low Risk Adult  Completed      Plan:     I have personally reviewed and addressed the Medicare Annual Wellness questionnaire and have noted the following in the patient's chart:  A. Medical and social history B. Use of alcohol, tobacco or illicit drugs  C. Current medications and supplements D. Functional ability and status E.  Nutritional status F.  Physical activity G. Advance directives H. List of other physicians I.  Hospitalizations, surgeries, and ER visits in previous 12 months J.  Vitals K. Screenings to include hearing, vision, cognitive, depression L. Referrals and appointments - none  In addition, I have reviewed and discussed with patient certain preventive protocols, quality metrics, and best practice recommendations. A written personalized care plan for preventive services as well as general preventive health recommendations were provided to patient.  See attached scanned questionnaire for additional information.   Signed,   Randa Evens, MHA, BS, LPN Health Coach

## 2016-07-29 NOTE — Progress Notes (Signed)
Pre visit review using our clinic review tool, if applicable. No additional management support is needed unless otherwise documented below in the visit note. 

## 2016-07-29 NOTE — Telephone Encounter (Signed)
Rx called in to pharmacy. 

## 2016-07-29 NOTE — Progress Notes (Signed)
I reviewed health advisor's note, was available for consultation, and agree with documentation and plan.  

## 2016-07-29 NOTE — Telephone Encounter (Signed)
Patient was in today and requested a refill for Xanax. PCP is out of office today. Please review for approval.

## 2016-07-30 LAB — HEPATITIS C ANTIBODY: HCV AB: NEGATIVE

## 2016-08-03 ENCOUNTER — Telehealth: Payer: Self-pay

## 2016-08-03 ENCOUNTER — Ambulatory Visit (INDEPENDENT_AMBULATORY_CARE_PROVIDER_SITE_OTHER): Payer: BLUE CROSS/BLUE SHIELD | Admitting: Family Medicine

## 2016-08-03 ENCOUNTER — Encounter: Payer: Self-pay | Admitting: Family Medicine

## 2016-08-03 VITALS — BP 122/82 | HR 107 | Temp 97.7°F | Wt 143.0 lb

## 2016-08-03 DIAGNOSIS — J449 Chronic obstructive pulmonary disease, unspecified: Secondary | ICD-10-CM

## 2016-08-03 DIAGNOSIS — F32A Depression, unspecified: Secondary | ICD-10-CM

## 2016-08-03 DIAGNOSIS — E0800 Diabetes mellitus due to underlying condition with hyperosmolarity without nonketotic hyperglycemic-hyperosmolar coma (NKHHC): Secondary | ICD-10-CM

## 2016-08-03 DIAGNOSIS — Z01419 Encounter for gynecological examination (general) (routine) without abnormal findings: Secondary | ICD-10-CM

## 2016-08-03 DIAGNOSIS — E782 Mixed hyperlipidemia: Secondary | ICD-10-CM | POA: Diagnosis not present

## 2016-08-03 DIAGNOSIS — E119 Type 2 diabetes mellitus without complications: Secondary | ICD-10-CM

## 2016-08-03 DIAGNOSIS — E559 Vitamin D deficiency, unspecified: Secondary | ICD-10-CM | POA: Diagnosis not present

## 2016-08-03 DIAGNOSIS — F329 Major depressive disorder, single episode, unspecified: Secondary | ICD-10-CM | POA: Diagnosis not present

## 2016-08-03 DIAGNOSIS — Z0000 Encounter for general adult medical examination without abnormal findings: Secondary | ICD-10-CM | POA: Diagnosis not present

## 2016-08-03 MED ORDER — ONETOUCH DELICA LANCING DEV MISC: 1.0000 "pen " | Freq: Once | 0 refills | Status: DC

## 2016-08-03 MED ORDER — GLUCOSE BLOOD VI STRP: ORAL_STRIP | 3 refills | Status: DC

## 2016-08-03 MED ORDER — ONETOUCH ULTRA 2 W/DEVICE KIT: 1.0000 | PACK | Freq: Once | 0 refills | Status: DC

## 2016-08-03 MED ORDER — ONETOUCH DELICA LANCETS FINE MISC: 1.0000 [IU] | Freq: Every day | 4 refills | Status: DC

## 2016-08-03 NOTE — Progress Notes (Signed)
Subjective:   Patient ID: Sara Stafford, female    DOB: 1946-03-07, 71 y.o.   MRN: [id number]  Sara Stafford is a pleasant 71 y.o. year old female who presents to clinic today with Medicare Wellness (Part 2. Saw Sara 07-29-16. Needs a diabetic meter and supplies. Most likely one touch) and Discuss Dulera  on 08/03/2016  HPI:  Annual medicare wellness visit with [name], RN on 07/29/16. Note reviewed.  UTD on prevention (all discussed with Lu Duffelarlesia Stafford)  but she is do for foot exam today.  DM- On Metformin 1000 mg twice daily, januvia 50 mg daily and glipizide 10 mg daily.    Denies any episodes of hypoglycemia.  Admits to not checking FSBS on regular basis.  Has been to a diabetic educator and says she knows what to do..  She is compliant with her medications.  Refused endo referral previously- wanted to work on diet.  Lab Results  Component Value Date   HGBA1C 7.6 (H) 05/16/2016    HLD- taking simvastatin 80 mg daily.  LDL has been at goal for a diabetic.  Lab Results  Component Value Date   CHOL 154 05/16/2016   HDL 43.20 05/16/2016   LDLCALC 73 05/16/2016   LDLDIRECT 80.8 10/24/2012   TRIG 189.0 (H) 05/16/2016   CHOLHDL 4 05/16/2016   COPD- has been taking Dulera and Singulair. Lab Results  Component Value Date   ALT 14 05/16/2016   AST 17 05/16/2016   ALKPHOS 61 05/16/2016   BILITOT 0.5 05/16/2016   Depression- symptoms have been well controlled with current rxs.   Current Outpatient Prescriptions on File Prior to Visit  Medication Sig Dispense Refill  . ALPRAZolam (XANAX) 0.5 MG tablet TAKE ONE-HALF TABLET BY MOUTH TWICE DAILY 30 tablet 0  . aspirin 81 MG tablet Take 81 mg by mouth as needed.      . cetirizine (ZYRTEC) 10 MG tablet TAKE 1 TABLET BY MOUTH ONCE DAILY. 30 tablet 5  . glipiZIDE (GLUCOTROL XL) 10 MG 24 hr tablet TAKE (2) TABLETS BY MOUTH ONCE DAILY. 60 tablet 5  . JANUVIA 50 MG tablet TAKE 1 TABLET BY MOUTH ONCE DAILY. 30  tablet 5  . metFORMIN (GLUCOPHAGE) 1000 MG tablet TAKE (1) TABLET TWICE A DAY WITH FOOD---BREAKFAST AND SUPPER. 60 tablet 2  . mometasone-formoterol (DULERA) 100-5 MCG/ACT AERO Inhale 2 puffs into the lungs 2 (two) times daily. 13 g 12  . montelukast (SINGULAIR) 10 MG tablet TAKE ONE TABLET BY MOUTH AT BEDTIME. 30 tablet 2  . omeprazole (PRILOSEC) 20 MG capsule TAKE (1) CAPSULE BY MOUTH ONCE DAILY AS NEEDED. 30 capsule 2  . simvastatin (ZOCOR) 80 MG tablet TAKE 1 TABLET AT BEDTIME. 30 tablet 10  . traMADol (ULTRAM) 50 MG tablet Take 1 tablet (50 mg total) by mouth every 6 (six) hours as needed. 50 tablet 1  . venlafaxine (EFFEXOR) 37.5 MG tablet TAKE (1/2) TABLET TWICE DAILY 30 tablet 5  . VENTOLIN HFA 108 (90 Base) MCG/ACT inhaler INHALE 2 PUFFS BY MOUTH EVERY 4 HOURS AS NEEDED 18 each 5  . diclofenac sodium (VOLTAREN) 1 % GEL Apply 4 g topically 4 (four) times daily. (Patient not taking: Reported on 08/03/2016) 100 g 0  . [DISCONTINUED] omeprazole (PRILOSEC OTC) 20 MG tablet Take 1 tablet (20 mg total) by mouth daily as needed. 90 tablet 1   No current facility-administered medications on file prior to visit.     Allergies  Allergen Reactions  . Doxycycline  REACTION: thrush    Past Medical History:  Diagnosis Date  . COPD (chronic obstructive pulmonary disease) (HCC)    no change with spiriva, poor tolerance of advair  . Depressive disorder, not elsewhere classified   . Diabetes mellitus   . Fatigue   . Fibromyalgia   . GERD (gastroesophageal reflux disease)   . Hyperlipidemia   . Routine general medical examination at a health care facility     Past Surgical History:  Procedure Laterality Date  . ABDOMINAL HYSTERECTOMY     partial  . CATARACT EXTRACTION Left 02/09/2015  . CATARACT EXTRACTION Right 01/26/2015    Family History  Problem Relation Age of Onset  . Stroke Father 26  . Diabetes Father   . Asthma Father   . Liver cancer Sister 27  . Pancreatic cancer Sister  54  . Brain cancer Brother   . Diabetes Sister   . Diabetes Sister   . Diabetes Sister   . Diabetes Sister   . Diabetes Sister   . Diabetes Sister   . Diabetes Sister   . Diabetes Paternal Grandmother     Social History   Social History  . Marital status: Married    Spouse name: N/A  . Number of children: 2  . Years of education: N/A   Occupational History  . retired Retired    Actuary   Social History Main Topics  . Smoking status: Former Smoker    Packs/day: 1.00    Years: 50.00    Quit date: 06/06/2010  . Smokeless tobacco: Never Used  . Alcohol use No  . Drug use: No  . Sexual activity: No   Other Topics Concern  . Not on file   Social History Narrative   Children aged 58, and 70, expecting 2nd grandchild   The PMH, PSH, Social History, Family History, Medications, and allergies have been reviewed in [facility], and have been updated if relevant.  Review of Systems  Constitutional: Negative.   HENT: Negative.   Eyes: Negative.   Respiratory: Negative.   Cardiovascular: Negative.   Gastrointestinal: Negative.   Endocrine: Negative.   Genitourinary: Negative.   Musculoskeletal: Negative.   Skin: Negative.   Allergic/Immunologic: Negative.   Neurological: Negative.   Hematological: Negative.   Psychiatric/Behavioral: Negative.   All other systems reviewed and are negative.      Objective:    BP 122/82 (BP Location: Left Arm, Patient Position: Sitting, Cuff Size: Normal)   Pulse (!) 107   Temp 97.7 F (36.5 C) (Oral)   Wt 143 lb (64.9 kg)   SpO2 97%   BMI 24.17 kg/m    Physical Exam    General:  Well-developed,well-nourished,in no acute distress; alert,appropriate and cooperative throughout examination Head:  normocephalic and atraumatic.   Eyes:  vision grossly intact, PERRL Ears:  R ear normal and L ear normal externally, TMs clear bilaterally Nose:  no external deformity.   Mouth:  good dentition.   Neck:  No deformities, masses, or  tenderness noted. Breasts:  No mass, nodules, thickening, tenderness, bulging, retraction, inflamation, nipple discharge or skin changes noted.   Lungs:  Normal respiratory effort, chest expands symmetrically. Lungs are clear to auscultation, no crackles or wheezes. Heart:  Normal rate and regular rhythm. S1 and S2 normal without gallop, murmur, click, rub or other extra sounds. Abdomen:  Bowel sounds positive,abdomen soft and non-tender without masses, organomegaly or hernias noted. Msk:  No deformity or scoliosis noted of thoracic or lumbar spine.  Extremities:  No clubbing, cyanosis, edema, or deformity noted with normal full range of motion of all joints.   Neurologic:  alert & oriented X3 and gait normal.   Skin:  Intact without suspicious lesions or rashes Cervical Nodes:  No lymphadenopathy noted Axillary Nodes:  No palpable lymphadenopathy Psych:  Cognition and judgment appear intact. Alert and cooperative with normal attention span and concentration. No apparent delusions, illusions, hallucinations      Assessment & Plan:   Well woman exam  Depression, unspecified depression type  Diabetes mellitus due to underlying condition with hyperosmolarity without coma, without long-term current use of insulin (HCC)  HYPERLIPIDEMIA No Follow-up on file.

## 2016-08-03 NOTE — Assessment & Plan Note (Signed)
Reviewed preventive care protocols, scheduled due services, and updated immunizations Discussed nutrition, exercise, diet, and healthy lifestyle.  

## 2016-08-03 NOTE — Telephone Encounter (Signed)
Pharmacist didn't leave a name or number is calling to request PA for one touch test strips. Thank you.

## 2016-08-03 NOTE — Progress Notes (Signed)
Pre visit review using our clinic review tool, if applicable. No additional management support is needed unless otherwise documented below in the visit note. 

## 2016-08-03 NOTE — Assessment & Plan Note (Signed)
Continue current rxs. 

## 2016-08-03 NOTE — Patient Instructions (Addendum)
Great to see you.  Check with your insurance company or pharmacist to see what medication similar to dulera they will cover.  Please return in a few weeks to have an a1c rechecked.  Please start taking Caltrate- calcium and vitamin D in it.  Follow directions on the bottle.

## 2016-08-03 NOTE — Assessment & Plan Note (Signed)
Well controlled on current rxs. No changes made to rxs today. 

## 2016-08-03 NOTE — Addendum Note (Signed)
Addended by: Eual FinesBRIDGES, Shaundrea Carrigg P on: 08/03/2016 01:00 PM   Modules accepted: Orders

## 2016-08-03 NOTE — Telephone Encounter (Signed)
For some reason, the insurance covered the unit and the lancets for $0 co-pay. The test strips are not going through no matter how they do it. I have done the PA request on CMM.

## 2016-08-03 NOTE — Assessment & Plan Note (Signed)
New- add caltrate daily.

## 2016-08-03 NOTE — Assessment & Plan Note (Signed)
Not yet due for a1c. She will return for that in a few weeks. No changes made today to rxs.  Foot exam today.

## 2016-08-04 MED ORDER — GLUCOSE BLOOD VI STRP: ORAL_STRIP | 4 refills | Status: DC

## 2016-08-04 MED ORDER — LANCET DEVICE MISC: 3 refills | Status: DC

## 2016-08-04 MED ORDER — BAYER CONTOUR NEXT MONITOR W/DEVICE KIT: 1.0000 | PACK | Freq: Once | 0 refills | Status: AC

## 2016-08-04 NOTE — Addendum Note (Signed)
Addended by: Eual FinesBRIDGES, Ramonte Mena P on: 08/04/2016 10:34 AM   Modules accepted: Orders

## 2016-08-04 NOTE — Telephone Encounter (Signed)
Please see previous telephone encounter. We are actively working on it

## 2016-08-04 NOTE — Telephone Encounter (Signed)
Finally received a PA form that stated Freestyle and Contour are on formulary. Will send new rxs for one of those.

## 2016-08-08 ENCOUNTER — Other Ambulatory Visit: Payer: Self-pay | Admitting: Family Medicine

## 2016-08-30 ENCOUNTER — Other Ambulatory Visit: Payer: Self-pay | Admitting: *Deleted

## 2016-08-30 MED ORDER — ALPRAZOLAM 0.5 MG PO TABS: ORAL_TABLET | ORAL | 0 refills | Status: DC

## 2016-08-30 NOTE — Telephone Encounter (Signed)
Last office visit 08/03/16.  Last refilled 07/29/16 for #30 with no refills.  Ok to refill?

## 2016-08-30 NOTE — Telephone Encounter (Signed)
Alprazolam called into [facility], [place]. [place], [place] - [address]: [phone].

## 2016-09-08 ENCOUNTER — Other Ambulatory Visit: Payer: Self-pay | Admitting: Family Medicine

## 2016-09-08 ENCOUNTER — Telehealth: Payer: Self-pay | Admitting: Family Medicine

## 2016-09-08 DIAGNOSIS — Z1231 Encounter for screening mammogram for malignant neoplasm of breast: Secondary | ICD-10-CM

## 2016-09-08 NOTE — Telephone Encounter (Signed)
Spoke to pt. Apologized that it slipped by [place]. Sent the order for it. She messed it up last year. Says she understands how to do it now.

## 2016-09-08 NOTE — Telephone Encounter (Signed)
Patient called and said she hasn't received Cologuard.  Patient had her physical in March.

## 2016-09-28 ENCOUNTER — Other Ambulatory Visit: Payer: Self-pay | Admitting: *Deleted

## 2016-09-28 MED ORDER — ALPRAZOLAM 0.5 MG PO TABS: ORAL_TABLET | ORAL | 0 refills | Status: DC

## 2016-09-28 NOTE — Telephone Encounter (Signed)
Last office visit 08/03/2016.  Last refilled 08/30/2016 for #30 with no refills.  Ok to refill?

## 2016-09-28 NOTE — Telephone Encounter (Signed)
Alprazolam called into [facility], [place]. [place], [place] - [address] Phone: [phone]

## 2016-10-04 ENCOUNTER — Ambulatory Visit: Payer: BLUE CROSS/BLUE SHIELD

## 2016-10-07 ENCOUNTER — Other Ambulatory Visit: Payer: Self-pay | Admitting: Family Medicine

## 2016-10-31 ENCOUNTER — Other Ambulatory Visit: Payer: Self-pay

## 2016-10-31 MED ORDER — ALPRAZOLAM 0.5 MG PO TABS: ORAL_TABLET | ORAL | 0 refills | Status: DC

## 2016-10-31 NOTE — Telephone Encounter (Signed)
FAXED TO  [facility], [place] - [place], [place] - [address]  [address] [place] [address]  Phone: [phone] Fax: [phone]

## 2016-10-31 NOTE — Telephone Encounter (Signed)
Last refill 09/28/16 last OV 08/03/16

## 2016-11-07 ENCOUNTER — Other Ambulatory Visit: Payer: Self-pay | Admitting: Family Medicine

## 2016-11-21 DIAGNOSIS — H2513 Age-related nuclear cataract, bilateral: Secondary | ICD-10-CM | POA: Diagnosis not present

## 2016-11-22 ENCOUNTER — Ambulatory Visit
Admission: RE | Admit: 2016-11-22 | Discharge: 2016-11-22 | Disposition: A | Discharge status: Home / Self Care | Payer: BLUE CROSS/BLUE SHIELD | Source: Ambulatory Visit | Attending: Family Medicine | Admitting: Family Medicine

## 2016-11-22 DIAGNOSIS — Z1231 Encounter for screening mammogram for malignant neoplasm of breast: Secondary | ICD-10-CM | POA: Diagnosis not present

## 2016-11-28 ENCOUNTER — Other Ambulatory Visit: Payer: Self-pay | Admitting: Family Medicine

## 2016-11-28 MED ORDER — ALPRAZOLAM 0.5 MG PO TABS: ORAL_TABLET | ORAL | 0 refills | Status: DC

## 2016-11-28 NOTE — Telephone Encounter (Signed)
Received faxed refill request for ALPRAZolam (XANAX) 0.5 MG tablet #30 with 0 refills.  Last prescribed on 10/31/2016. Last seen on 08/03/2016

## 2016-11-28 NOTE — Telephone Encounter (Signed)
Called in medication to the pharmacy as instructed. 

## 2016-12-06 DIAGNOSIS — E119 Type 2 diabetes mellitus without complications: Secondary | ICD-10-CM | POA: Diagnosis not present

## 2016-12-07 ENCOUNTER — Other Ambulatory Visit: Payer: Self-pay | Admitting: Family Medicine

## 2016-12-29 ENCOUNTER — Other Ambulatory Visit (INDEPENDENT_AMBULATORY_CARE_PROVIDER_SITE_OTHER): Payer: BLUE CROSS/BLUE SHIELD

## 2016-12-29 ENCOUNTER — Other Ambulatory Visit: Payer: Self-pay

## 2016-12-29 DIAGNOSIS — E0800 Diabetes mellitus due to underlying condition with hyperosmolarity without nonketotic hyperglycemic-hyperosmolar coma (NKHHC): Secondary | ICD-10-CM | POA: Diagnosis not present

## 2016-12-29 LAB — HEMOGLOBIN A1C: Hgb A1c MFr Bld: 8.8 % — ABNORMAL HIGH (ref 4.6–6.5)

## 2016-12-29 MED ORDER — ALPRAZOLAM 0.5 MG PO TABS: ORAL_TABLET | ORAL | 3 refills | Status: DC

## 2016-12-29 NOTE — Telephone Encounter (Signed)
rx faxed to  [facility] [place] - [place], [place] - [address]  [address] [place] [address]  Phone: [phone] Fax: [phone]

## 2016-12-29 NOTE — Telephone Encounter (Signed)
Last refill 11/28/16 Last OV 07/29/16 Ok to refill?

## 2017-01-05 ENCOUNTER — Ambulatory Visit (INDEPENDENT_AMBULATORY_CARE_PROVIDER_SITE_OTHER): Payer: BLUE CROSS/BLUE SHIELD | Admitting: Family Medicine

## 2017-01-05 ENCOUNTER — Encounter: Payer: Self-pay | Admitting: Family Medicine

## 2017-01-05 VITALS — BP 140/60 | HR 95 | Ht 64.75 in | Wt 147.0 lb

## 2017-01-05 DIAGNOSIS — E0800 Diabetes mellitus due to underlying condition with hyperosmolarity without nonketotic hyperglycemic-hyperosmolar coma (NKHHC): Secondary | ICD-10-CM

## 2017-01-05 MED ORDER — SITAGLIPTIN PHOSPHATE 100 MG PO TABS: 100.0000 mg | ORAL_TABLET | Freq: Every day | ORAL | 3 refills | Status: DC

## 2017-01-05 NOTE — Progress Notes (Signed)
Subjective:   Patient ID: Sara Stafford, female    DOB: Jun 09, 1945, 71 y.o.   MRN: [id number]  Sara Stafford is a pleasant 71 y.o. year old female who presents to clinic today with Follow-up  on 01/05/2017  HPI:   DM-  On Metformin 1000 mg twice daily, januvia 50 mg daily and glipizide 10 mg daily.    Denies any episodes of hypoglycemia.  Admits to not checking FSBS on regular basis.  Has been to a diabetic educator and says she knows what to do. She is compliant with her medications.  Refused endo referral previously- wanted to work on diet but a1c continues to go up.  Admits to eating a candy bar and chips every day.  Has also been eating ice cream sandwiches almost nightly.   Lab Results  Component Value Date   HGBA1C 8.8 (H) 12/29/2016    HLD- taking simvastatin 80 mg daily.  LDL has been at goal for a diabetic.  Lab Results  Component Value Date   CHOL 154 05/16/2016   HDL 43.20 05/16/2016   LDLCALC 73 05/16/2016   LDLDIRECT 80.8 10/24/2012   TRIG 189.0 (H) 05/16/2016   CHOLHDL 4 05/16/2016      Current Outpatient Prescriptions on File Prior to Visit  Medication Sig Dispense Refill  . ALPRAZolam (XANAX) 0.5 MG tablet TAKE ONE-HALF TABLET BY MOUTH TWICE DAILY 30 tablet 3  . aspirin 81 MG tablet Take 81 mg by mouth as needed.      . cetirizine (ZYRTEC) 10 MG tablet TAKE 1 TABLET BY MOUTH ONCE DAILY. 30 tablet 0  . diclofenac sodium (VOLTAREN) 1 % GEL Apply 4 g topically 4 (four) times daily. 100 g 0  . glipiZIDE (GLUCOTROL XL) 10 MG 24 hr tablet TAKE (2) TABLETS BY MOUTH ONCE DAILY. 60 tablet 5  . glucose blood (BAYER CONTOUR NEXT TEST) test strip USE TO CHECK BLOOD SUGAR ONCE A DAY 100 each 4  . Lancet Device MISC USE DAILY TO OBTAIN BLOOD SUGAR Dx Code E08.00 100 each 3  . metFORMIN (GLUCOPHAGE) 1000 MG tablet TAKE (1) TABLET TWICE A DAY WITH FOOD---BREAKFAST AND SUPPER. 60 tablet 5  . mometasone-formoterol (DULERA) 100-5 MCG/ACT AERO Inhale 2 puffs  into the lungs 2 (two) times daily. 13 g 12  . montelukast (SINGULAIR) 10 MG tablet TAKE ONE TABLET BY MOUTH AT BEDTIME. 30 tablet 0  . omeprazole (PRILOSEC) 20 MG capsule TAKE (1) CAPSULE BY MOUTH ONCE DAILY AS NEEDED. 30 capsule 5  . simvastatin (ZOCOR) 80 MG tablet TAKE 1 TABLET AT BEDTIME. 30 tablet 10  . traMADol (ULTRAM) 50 MG tablet Take 1 tablet (50 mg total) by mouth every 6 (six) hours as needed. 50 tablet 1  . venlafaxine (EFFEXOR) 37.5 MG tablet TAKE (1/2) TABLET BY MOUTH TWICE DAILY. 30 tablet 11  . VENTOLIN HFA 108 (90 Base) MCG/ACT inhaler INHALE 2 PUFFS BY MOUTH EVERY 4 HOURS AS NEEDED 18 each 5  . [DISCONTINUED] omeprazole (PRILOSEC OTC) 20 MG tablet Take 1 tablet (20 mg total) by mouth daily as needed. 90 tablet 1   No current facility-administered medications on file prior to visit.     Allergies  Allergen Reactions  . Doxycycline     REACTION: thrush    Past Medical History:  Diagnosis Date  . COPD (chronic obstructive pulmonary disease) (HCC)    no change with spiriva, poor tolerance of advair  . Depressive disorder, not elsewhere classified   . Diabetes mellitus   .  Fatigue   . Fibromyalgia   . GERD (gastroesophageal reflux disease)   . Hyperlipidemia   . Routine general medical examination at a health care facility     Past Surgical History:  Procedure Laterality Date  . ABDOMINAL HYSTERECTOMY     partial  . CATARACT EXTRACTION Left 02/09/2015  . CATARACT EXTRACTION Right 01/26/2015    Family History  Problem Relation Age of Onset  . Stroke Father 5080  . Diabetes Father   . Asthma Father   . Liver cancer Sister 5881  . Pancreatic cancer Sister 7072  . Brain cancer Brother   . Diabetes Sister   . Diabetes Sister   . Diabetes Sister   . Diabetes Sister   . Diabetes Sister   . Diabetes Sister   . Diabetes Sister   . Diabetes Paternal Grandmother   . Breast cancer Maternal Aunt 6170    Social History   Social History  . Marital status: Married     Spouse name: N/A  . Number of children: 2  . Years of education: N/A   Occupational History  . retired Retired    [employer]uary[employer]   Social History Main Topics  . Smoking status: Former Smoker    Packs/day: 1.00    Years: 50.00    Quit date: 06/06/2010  . Smokeless tobacco: Never Used  . Alcohol use No  . Drug use: No  . Sexual activity: No   Other Topics Concern  . Not on file   Social History Narrative   Children aged 73, and 5335, expecting 2nd grandchild   The PMH, PSH, Social History, Family History, Medications, and allergies [facility], and have been updated if relevant.  Review of Systems  Constitutional: Negative.   HENT: Negative.   Eyes: Negative.   Respiratory: Negative.   Cardiovascular: Negative.   Gastrointestinal: Negative.   Endocrine: Negative.   Genitourinary: Negative.   Musculoskeletal: Negative.   Skin: Negative.   Allergic/Immunologic: Negative.   Neurological: Negative.   Hematological: Negative.   Psychiatric/Behavioral: Negative.   All other systems reviewed and are negative.      Objective:    BP 140/60   Pulse 95   Ht 5' 4.75" (1.645 m)   Wt 147 lb (66.7 kg)   SpO2 96%   BMI 24.65 kg/m  Wt Readings from Last 3 Encounters:  01/05/17 147 lb (66.7 kg)  08/03/16 143 lb (64.9 kg)  07/29/16 142 lb 8 oz (64.6 kg)     Physical Exam  Constitutional: She is oriented to person, place, and time. She appears well-developed and well-nourished. No distress.  HENT:  Head: Normocephalic and atraumatic.  Eyes: Conjunctivae are normal.  Cardiovascular: Normal rate and regular rhythm.   Pulmonary/Chest: Effort normal and breath sounds normal.  Musculoskeletal: Normal range of motion.  Neurological: She is alert and oriented to person, place, and time.  Skin: Skin is warm and dry. She is not diaphoretic.  Psychiatric: She has a normal mood and affect. Her behavior is normal. Judgment and thought content normal.  Nursing note and  vitals reviewed.         Assessment & Plan:   Diabetes mellitus due to underlying condition with hyperosmolarity without coma, without long-term current use of insulin (HCC) No Follow-up on file.

## 2017-01-05 NOTE — Assessment & Plan Note (Signed)
Deteriorated. Discussed importance of following a diabetic diet. Increase januvia to 100 mg daily. She feels she is now motivated to make these changes. Follow up labs in 3 months.

## 2017-01-05 NOTE — Patient Instructions (Signed)
Great to see you.  We are increasing your Januvia to 100 mg daily.  Please return in 3 months for labs.

## 2017-01-09 ENCOUNTER — Other Ambulatory Visit: Payer: Self-pay | Admitting: Family Medicine

## 2017-02-23 ENCOUNTER — Other Ambulatory Visit: Payer: Self-pay | Admitting: Family Medicine

## 2017-02-28 DIAGNOSIS — Z23 Encounter for immunization: Secondary | ICD-10-CM | POA: Diagnosis not present

## 2017-03-05 IMAGING — CR DG KNEE COMPLETE 4+V*R*
5 series · 5 of 5 positions shown · non-contrast
Comparison: None.

CLINICAL DATA: Constant right knee pain medially for 2 days,
swelling, no trauma

EXAM:
RIGHT KNEE - COMPLETE 4+ VIEW

[view not recorded (1 of 5)]
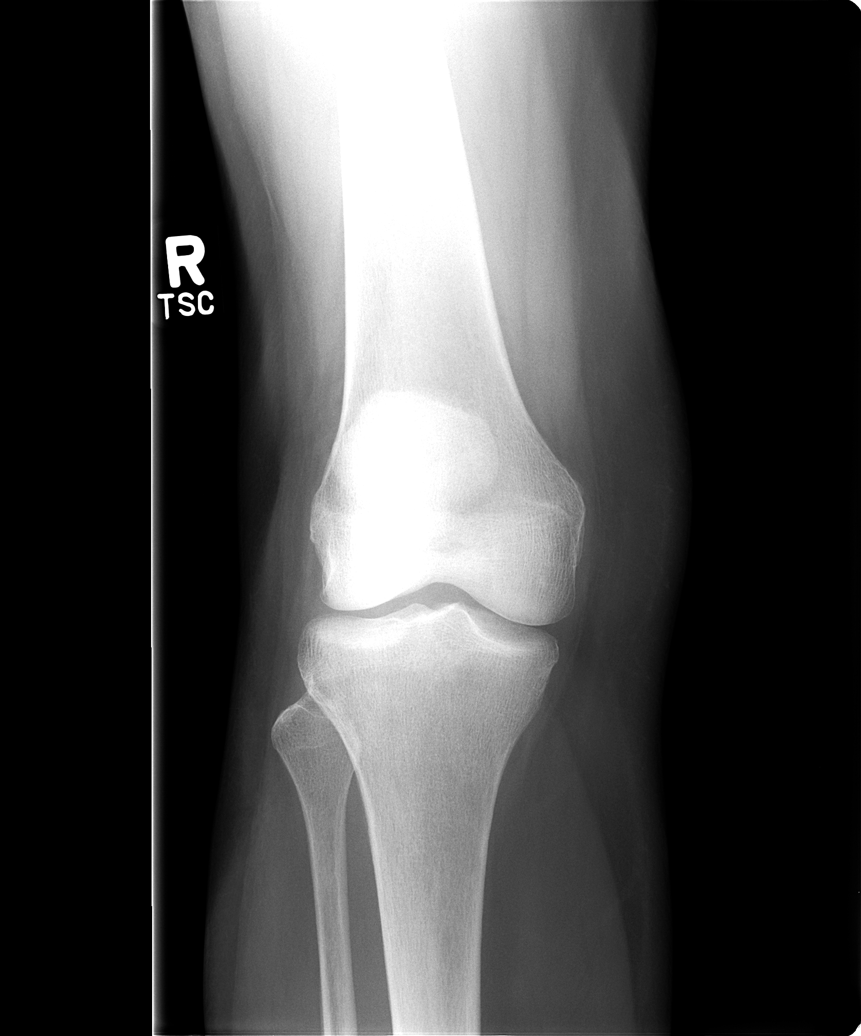

[view not recorded (2 of 5)]
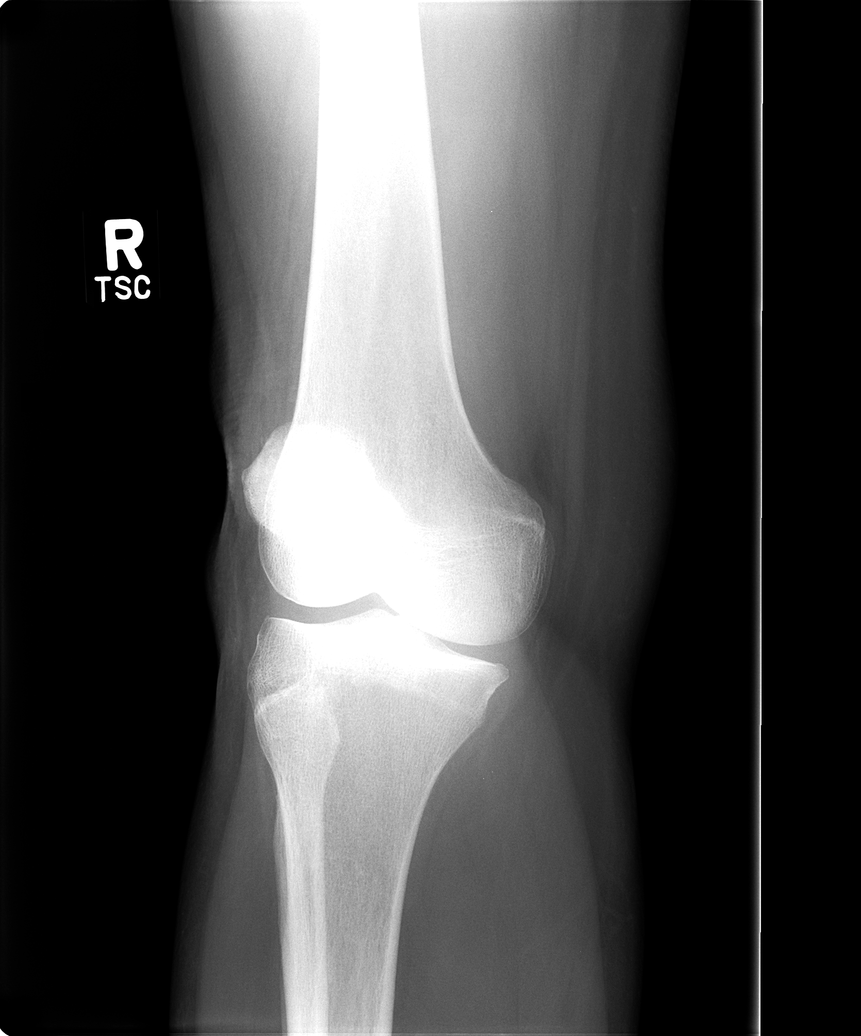

[view not recorded (3 of 5)]
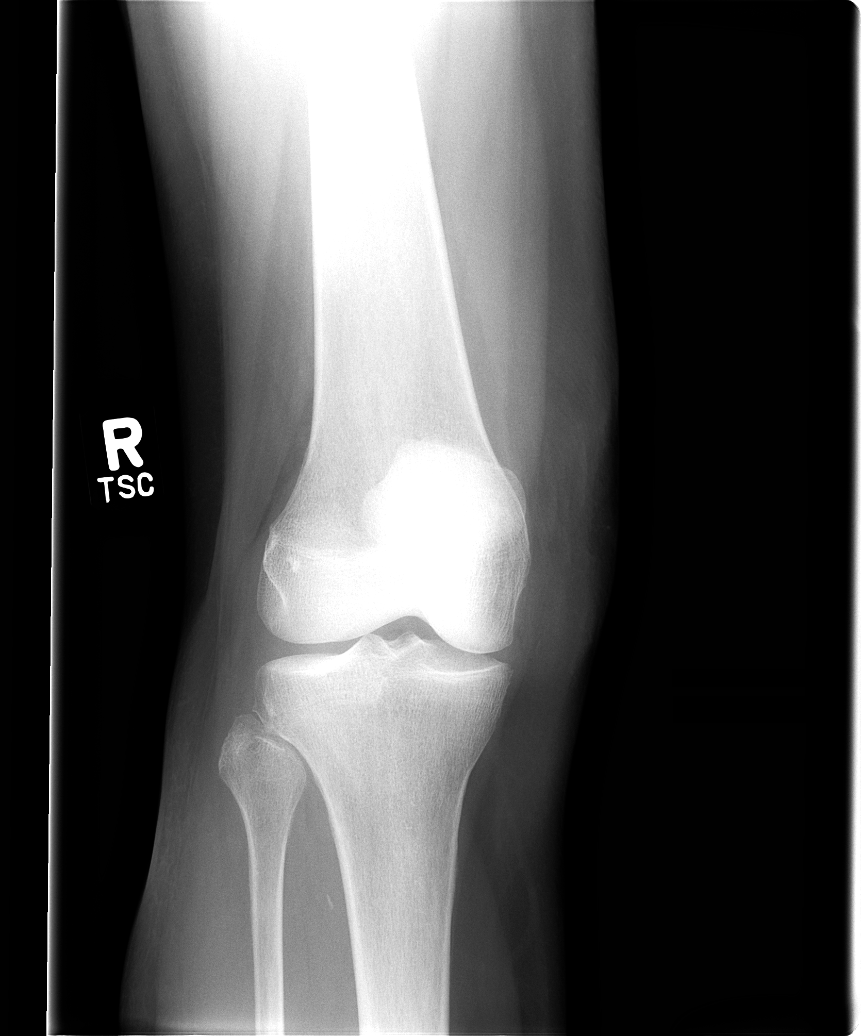

[view not recorded (4 of 5)]
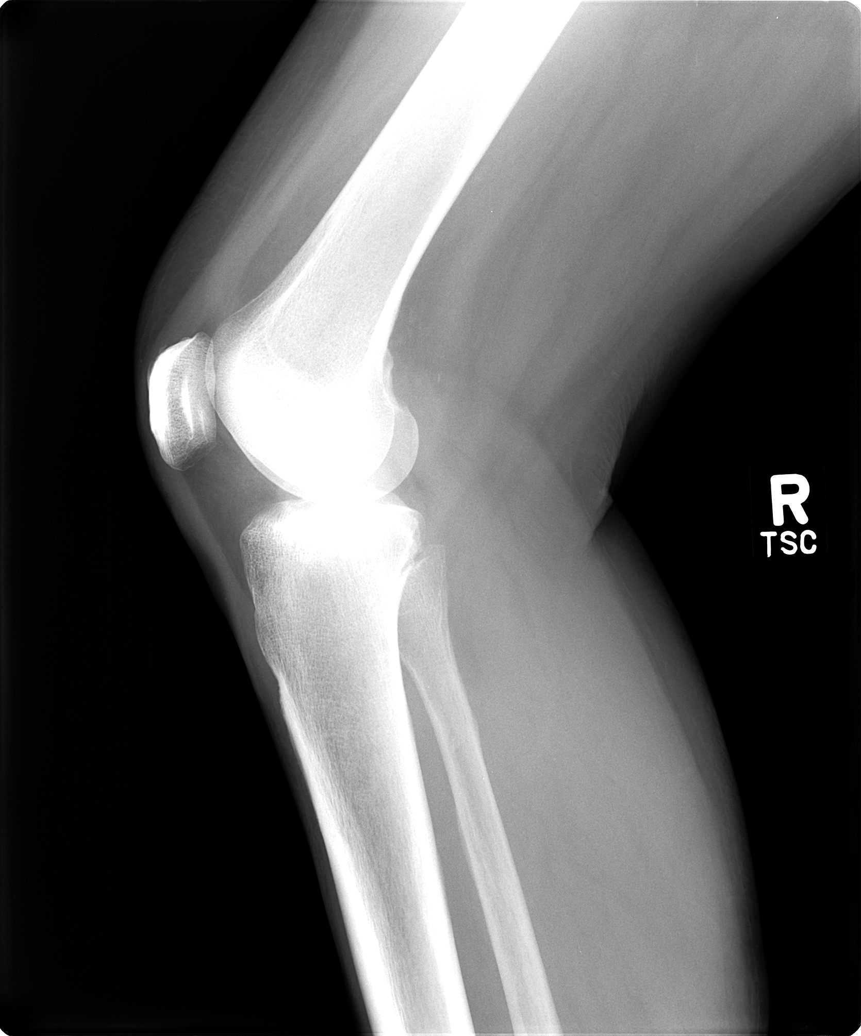

[view not recorded (5 of 5)]
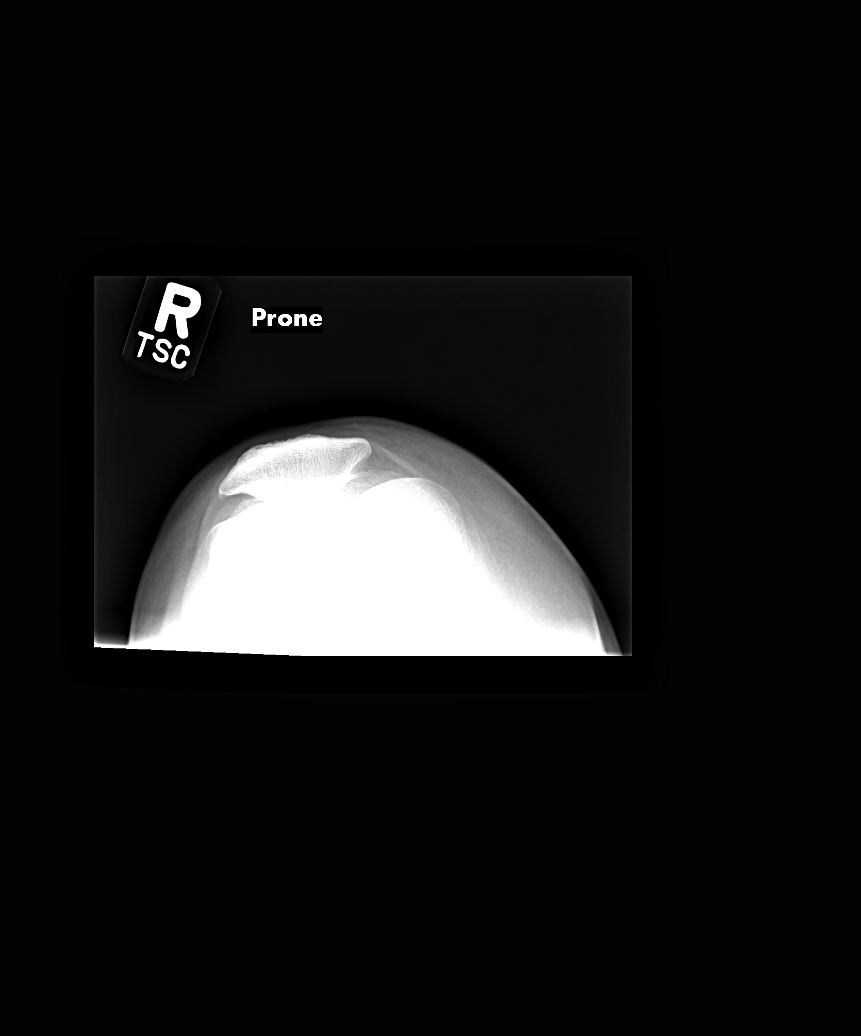

[5 of 5 positions shown; findings below may reference images not displayed]

FINDINGS: The knee joint spaces are relatively well preserved for age. No
fracture is seen. No joint effusion is noted. The patella appears
normally positioned.
IMPRESSION: Negative.

## 2017-03-08 ENCOUNTER — Other Ambulatory Visit: Payer: Self-pay | Admitting: Family Medicine

## 2017-03-27 ENCOUNTER — Other Ambulatory Visit: Payer: Self-pay | Admitting: Family Medicine

## 2017-04-05 ENCOUNTER — Other Ambulatory Visit: Payer: Self-pay | Admitting: Family Medicine

## 2017-04-07 ENCOUNTER — Other Ambulatory Visit (INDEPENDENT_AMBULATORY_CARE_PROVIDER_SITE_OTHER): Payer: BLUE CROSS/BLUE SHIELD

## 2017-04-07 DIAGNOSIS — E0800 Diabetes mellitus due to underlying condition with hyperosmolarity without nonketotic hyperglycemic-hyperosmolar coma (NKHHC): Secondary | ICD-10-CM | POA: Diagnosis not present

## 2017-04-07 DIAGNOSIS — Z01419 Encounter for gynecological examination (general) (routine) without abnormal findings: Secondary | ICD-10-CM | POA: Diagnosis not present

## 2017-04-07 DIAGNOSIS — E782 Mixed hyperlipidemia: Secondary | ICD-10-CM

## 2017-04-07 LAB — LIPID PANEL
CHOLESTEROL: 147 mg/dL (ref 0–200)
HDL: 40 mg/dL (ref >39.00)
LDL Cholesterol: 77 mg/dL (ref 0–99)
NONHDL: 107.48
Total CHOL/HDL Ratio: 4
Triglycerides: 152 mg/dL — ABNORMAL HIGH (ref 0.0–149.0)
VLDL: 30.4 mg/dL (ref 0.0–40.0)

## 2017-04-07 LAB — MICROALBUMIN / CREATININE URINE RATIO
CREATININE, U: 90.3 mg/dL
MICROALB UR: 1 mg/dL (ref 0.0–1.9)
Microalb Creat Ratio: 1.1 mg/g (ref 0.0–30.0)

## 2017-04-07 LAB — CBC WITH DIFFERENTIAL/PLATELET
BASOS PCT: 0.7 % (ref 0.0–3.0)
Basophils Absolute: 0 10*3/uL (ref 0.0–0.1)
EOS ABS: 0.1 10*3/uL (ref 0.0–0.7)
EOS PCT: 1.9 % (ref 0.0–5.0)
HCT: 43.2 % (ref 36.0–46.0)
Hemoglobin: 13.8 g/dL (ref 12.0–15.0)
LYMPHS ABS: 2 10*3/uL (ref 0.7–4.0)
Lymphocytes Relative: 31.7 % (ref 12.0–46.0)
MCHC: 32 g/dL (ref 30.0–36.0)
MCV: 90.7 fl (ref 78.0–100.0)
MONO ABS: 0.4 10*3/uL (ref 0.1–1.0)
Monocytes Relative: 6.7 % (ref 3.0–12.0)
NEUTROS PCT: 59 % (ref 43.0–77.0)
Neutro Abs: 3.8 10*3/uL (ref 1.4–7.7)
PLATELETS: 291 10*3/uL (ref 150.0–400.0)
RBC: 4.76 Mil/uL (ref 3.87–5.11)
RDW: 13.7 % (ref 11.5–15.5)
WBC: 6.4 10*3/uL (ref 4.0–10.5)

## 2017-04-07 LAB — COMPREHENSIVE METABOLIC PANEL
ALBUMIN: 4.6 g/dL (ref 3.5–5.2)
ALT: 15 U/L (ref 0–35)
AST: 16 U/L (ref 0–37)
Alkaline Phosphatase: 63 U/L (ref 39–117)
BUN: 12 mg/dL (ref 6–23)
CO2: 31 mEq/L (ref 19–32)
CREATININE: 0.74 mg/dL (ref 0.40–1.20)
Calcium: 10.1 mg/dL (ref 8.4–10.5)
Chloride: 99 mEq/L (ref 96–112)
GFR: 81.68 mL/min (ref >60.00)
GLUCOSE: 144 mg/dL — AB (ref 70–99)
POTASSIUM: 4.7 meq/L (ref 3.5–5.1)
SODIUM: 138 meq/L (ref 135–145)
TOTAL PROTEIN: 7.4 g/dL (ref 6.0–8.3)
Total Bilirubin: 0.5 mg/dL (ref 0.2–1.2)

## 2017-04-07 LAB — TSH: TSH: 1.83 u[IU]/mL (ref 0.35–4.50)

## 2017-04-07 LAB — HEMOGLOBIN A1C: HEMOGLOBIN A1C: 7.2 % — AB (ref 4.6–6.5)

## 2017-04-28 ENCOUNTER — Telehealth: Payer: Self-pay | Admitting: Family Medicine

## 2017-04-28 NOTE — Telephone Encounter (Signed)
Let's just ask her to stop taking it and to see if the pain resolves prior to adding another rx.

## 2017-04-28 NOTE — Telephone Encounter (Signed)
Pt aware to Maigan Bittinger/C Januvia for now and will discuss with Vernona RiegerKatherine Clark at upcoming OV on the 2nd/thx dmf

## 2017-04-28 NOTE — Telephone Encounter (Signed)
TA-Plz see patient complaint/she states that since the Januvia increase she has pain when she takes it/is asking if this can be split in half or if there is an alternative to this medication/plz advise/thx dmf

## 2017-04-28 NOTE — Telephone Encounter (Signed)
Copied from CRM [id number]. Topic: Quick Communication - See Telephone Encounter >> Apr 28, 2017 10:19 AM Arlyss Gandyichardson, Pinkney Venard N, NT wrote: CRM for notification. See Telephone encounter for: Patient states since Dr. Dayton MartesAron has increased her Januvia she has had stomach pain every time she takes it. She would like to see if she can half the pill or if Dr. Dayton MartesAron would like to change to rx. Please advise.  04/28/17.

## 2017-05-02 ENCOUNTER — Other Ambulatory Visit: Payer: Self-pay

## 2017-05-02 MED ORDER — ALPRAZOLAM 0.5 MG PO TABS: ORAL_TABLET | ORAL | 1 refills | Status: DC

## 2017-05-02 NOTE — Progress Notes (Signed)
Printed Alprazolam for 30d+1 to last till OV with new provider [employer]atherine Clark/will [facility] to [facility] when signed/thx dmf

## 2017-05-03 ENCOUNTER — Telehealth: Payer: Self-pay | Admitting: Family Medicine

## 2017-05-03 ENCOUNTER — Telehealth: Payer: Self-pay | Admitting: Primary Care

## 2017-05-03 ENCOUNTER — Telehealth: Payer: Self-pay

## 2017-05-03 NOTE — Telephone Encounter (Signed)
Left message for pt to return call to office regarding possible reaction to Januvia.

## 2017-05-03 NOTE — Telephone Encounter (Signed)
Noted  

## 2017-05-03 NOTE — Telephone Encounter (Signed)
Copied from CRM [id number]. Topic: Quick Communication - See Telephone Encounter >> May 03, 2017  9:32 AM Louie BunPalacios Medina, Rosey Batheresa D wrote: CRM for notification. See Telephone encounter for: 05/03/17. Patient called and said that she stopped taking JANUVIA 100 MG tablet last week and her stomach is feeling better. She would like something new called in for her to her pharmacy [facility] [place] - [place], [place] - [address] Please call patient if any questions, thanks.

## 2017-05-03 NOTE — Telephone Encounter (Signed)
Pt requested a refill.   Pharmacy said they have not received it.  It's for Xanx .5mg .   It looks like in the chart it was approved 12/4.  The original Rx was for Aug. 2nd for 3 refills which she has done.  She sees Dr. Dayton MartesAron.  If she approves can the Rx be sent to Fax number [phone].  Thanks so much.

## 2017-05-03 NOTE — Telephone Encounter (Signed)
Tonya from the pharmacy is calling stating they havent receive the fax and the pt is at the pharmacy for the second time today. [phone] Pharmacy number

## 2017-05-03 NOTE — Telephone Encounter (Signed)
Pt called back and message was given to her to take 1/2 tablet of the Januvia, as per request. Which would be 50 mg. Pt voiced understanding. Her pharmacist was notified.

## 2017-05-03 NOTE — Telephone Encounter (Signed)
I called in to pharmacy/thx dmf

## 2017-05-03 NOTE — Telephone Encounter (Signed)
Copied from CRM [phone] [id number]. Topic: Quick Communication - See Telephone Encounter >> May 03, 2017  8:41 AM Oneal GroutSebastian, Jennifer S wrote: CRM for notification. See Telephone encounter for:  Needing refill on xanax 0.5mg , patient is at pharmacy now. Pharmacy states they have been requesting all week 05/03/17.

## 2017-05-03 NOTE — Telephone Encounter (Signed)
I have now faxed this signed Rx twice/thx dmf

## 2017-05-03 NOTE — Telephone Encounter (Signed)
Yes that is a great idea.  Let's cut back to 1/2 her current dose, which would be 50 mg daily.

## 2017-05-03 NOTE — Telephone Encounter (Signed)
TA-Patient was instructed to Sara Stafford/C the Januvia due to the GI SE/her appt with Sara RiegerKatherine Stafford has been pushed out to January  Her fasting blood sugar was 193 @ 10:30am on 12.04.18 and she had black coffee no sugar  She did good on the lower dosage of Januvia/but although the current increased dosage is not scored is it possible to cut that in half and her take 1/2 of it bid as she will end up getting the full amount of the medication daily? Just asking/Plz advise/thx dmf

## 2017-05-04 MED ORDER — SITAGLIPTIN PHOSPHATE 50 MG PO TABS: 50.0000 mg | ORAL_TABLET | Freq: Every day | ORAL | 1 refills | Status: DC

## 2017-05-04 NOTE — Telephone Encounter (Signed)
LMOVM stating that Dr. Dayton MartesAron agrees to go back to the original 1/2 dosage of the BS medication (Januvia) and that I sent it to the pharmacy/note to pharmacy added stating that pt not tolerating 100mg  to go back to 50mg /thx dmf

## 2017-05-05 ENCOUNTER — Other Ambulatory Visit: Payer: Self-pay | Admitting: Family Medicine

## 2017-05-09 ENCOUNTER — Other Ambulatory Visit: Payer: Self-pay | Admitting: Family Medicine

## 2017-05-22 ENCOUNTER — Other Ambulatory Visit: Payer: Self-pay | Admitting: Family Medicine

## 2017-05-25 ENCOUNTER — Other Ambulatory Visit: Payer: Self-pay | Admitting: Family Medicine

## 2017-05-31 ENCOUNTER — Ambulatory Visit (INDEPENDENT_AMBULATORY_CARE_PROVIDER_SITE_OTHER): Payer: BLUE CROSS/BLUE SHIELD | Admitting: Primary Care

## 2017-05-31 ENCOUNTER — Encounter: Payer: Self-pay | Admitting: Primary Care

## 2017-05-31 ENCOUNTER — Other Ambulatory Visit: Payer: Self-pay | Admitting: Primary Care

## 2017-05-31 ENCOUNTER — Other Ambulatory Visit: Payer: Self-pay | Admitting: *Deleted

## 2017-05-31 VITALS — BP 122/82 | HR 78 | Temp 98.1°F | Ht 64.75 in | Wt 137.1 lb

## 2017-05-31 DIAGNOSIS — E0800 Diabetes mellitus due to underlying condition with hyperosmolarity without nonketotic hyperglycemic-hyperosmolar coma (NKHHC): Secondary | ICD-10-CM | POA: Diagnosis not present

## 2017-05-31 DIAGNOSIS — E782 Mixed hyperlipidemia: Secondary | ICD-10-CM

## 2017-05-31 DIAGNOSIS — J449 Chronic obstructive pulmonary disease, unspecified: Secondary | ICD-10-CM | POA: Diagnosis not present

## 2017-05-31 DIAGNOSIS — F411 Generalized anxiety disorder: Secondary | ICD-10-CM

## 2017-05-31 MED ORDER — HYDROXYZINE HCL 10 MG PO TABS: ORAL_TABLET | ORAL | 0 refills | Status: DC

## 2017-05-31 MED ORDER — VENLAFAXINE HCL 37.5 MG PO TABS: 37.5000 mg | ORAL_TABLET | Freq: Two times a day (BID) | ORAL | 0 refills | Status: DC

## 2017-05-31 NOTE — Assessment & Plan Note (Signed)
Stable on Dulera. Using albuterol once weekly on average. [facility] to [facility]. Continue current regimen.

## 2017-05-31 NOTE — Assessment & Plan Note (Signed)
Recent A1C stable, improved from prior level. Continue glipizide, metformin, and januvia. Will repeat A1C in 3 months to ensure stability of A1C.

## 2017-05-31 NOTE — Patient Instructions (Addendum)
We've increased the dose of your venlafaxine (Effexor) FROM 1/2 tablet twice daily TO 1 full tablet twice daily. This will help with anxiety symptoms.   Try using hydroxyzine 10 mg tablets as needed for breakthrough anxiety. Use this only as needed.  Please update me in a few weeks.  Schedule your Medicare Wellness Visit in March 2019 with our nurse. Make sure you come fasting for labs.  It was a pleasure meeting you!

## 2017-05-31 NOTE — Progress Notes (Signed)
Subjective:    Patient ID: Sara Stafford Stafford, female    DOB: 1945/08/24, 72 y.o.   MRN: [id number]  HPI  Sara Stafford Stafford is a 72 year old female who presents today to transfer care from Dr. Dayton Martes.    1) Type 2 Diabetes: Currently managed on glipizide XL 10 mg, metformin 1000 mg BID, sitagliptin 50 mg daily. Her last A1C was 7.2 in November 2018. Urine microalbumin negative in November 2018. She checks her blood sugars 1-2 times weekly typically fasting in the morning. This morning fasting was 161.  2) COPD: Currently managed on Dulera 100-5 mcg (2 puffs BID), ventolin HFA, and Singulair 10 mg. She uses her Ventolin once weekly on average. Previously managed per pulmolongy, no recent follow up as she was released given stable symptoms. She admits that the [facility] is expensive, but works better than other inhalers she's tried in the past.  3) Hyperlipidemia: Currently managed on simvastatin 80 mg. Her last lipid panel was with LDL of 77, TC of 174, Trigs of 152. She denies myalgias.   4) Anxiety and Depression: Currently managed on venlafaxine 37.5 mg (1/2 tablet BID) and alprazolam 0.5 mg (1/2 tablet BID). She's tried Zoloft in past which caused increase anxiety. She is out of her alprazolam, didn't sleep well last night. She takes her alprazolam twice daily with her venlafaxine. She does this to prevent anxiety, denies feeling anxious on a regular basis. No recent panic attack.   Review of Systems  Respiratory: Negative for shortness of breath.   Cardiovascular: Negative for chest pain.  Musculoskeletal: Negative for myalgias.  Neurological: Negative for dizziness and headaches.  Psychiatric/Behavioral:       See HPI       Past Medical History:  Diagnosis Date  . COPD (chronic obstructive pulmonary disease) (HCC)    no change with spiriva, poor tolerance of advair  . Depressive disorder, not elsewhere classified   . Diabetes mellitus   . Fatigue   . Fibromyalgia   . GERD  (gastroesophageal reflux disease)   . Hyperlipidemia   . Routine general medical examination at a health care facility      Social History   Socioeconomic History  . Marital status: Married    Spouse name: Not on file  . Number of children: 2  . Years of education: Not on file  . Highest education level: Not on file  Social Needs  . Financial resource strain: Not on file  . Food insecurity - worry: Not on file  . Food insecurity - inability: Not on file  . Transportation needs - medical: Not on file  . Transportation needs - non-medical: Not on file  Occupational History  . Occupation: retired    Associate Professor: RETIRED    Comment: [employer]  Tobacco Use  . Smoking status: Former Smoker    Packs/day: 1.00    Years: 50.00    Pack years: 50.00    Last attempt to quit: 06/06/2010    Years since quitting: 6.9  . Smokeless tobacco: Never Used  Substance and Sexual Activity  . Alcohol use: No    Alcohol/week: 0.0 oz  . Drug use: No  . Sexual activity: No  Other Topics Concern  . Not on file  Social History Narrative   Children aged 67, and 52, expecting 2nd grandchild    Past Surgical History:  Procedure Laterality Date  . ABDOMINAL HYSTERECTOMY     partial  . CATARACT EXTRACTION Left 02/09/2015  . CATARACT  EXTRACTION Right 01/26/2015    Family History  Problem Relation Age of Onset  . Stroke Father 10480  . Diabetes Father   . Asthma Father   . Liver cancer Sister 4381  . Pancreatic cancer Sister 4172  . Brain cancer Brother   . Diabetes Sister   . Diabetes Sister   . Diabetes Sister   . Diabetes Sister   . Diabetes Sister   . Diabetes Sister   . Diabetes Sister   . Diabetes Paternal Grandmother   . Breast cancer Maternal Aunt 70    Allergies  Allergen Reactions  . Doxycycline     REACTION: thrush    Current Outpatient Medications on File Prior to Visit  Medication Sig Dispense Refill  . aspirin 81 MG tablet Take 81 mg by mouth as needed.      . cetirizine  (ZYRTEC) 10 MG tablet TAKE 1 TABLET BY MOUTH ONCE DAILY. 30 tablet 0  . glipiZIDE (GLUCOTROL XL) 10 MG 24 hr tablet TAKE (2) TABLETS BY MOUTH ONCE DAILY. 60 tablet 5  . glucose blood (BAYER CONTOUR NEXT TEST) test strip USE TO CHECK BLOOD SUGAR ONCE A DAY 100 each 4  . Lancet Device MISC USE DAILY TO OBTAIN BLOOD SUGAR Dx Code E08.00 100 each 3  . metFORMIN (GLUCOPHAGE) 1000 MG tablet TAKE (1) TABLET TWICE A DAY WITH FOOD---BREAKFAST AND SUPPER. 60 tablet 0  . mometasone-formoterol (DULERA) 100-5 MCG/ACT AERO Inhale 2 puffs into the lungs 2 (two) times daily. 13 g 0  . montelukast (SINGULAIR) 10 MG tablet TAKE ONE TABLET BY MOUTH AT BEDTIME. 30 tablet 0  . omeprazole (PRILOSEC) 20 MG capsule TAKE (1) CAPSULE BY MOUTH ONCE DAILY AS NEEDED. 30 capsule 0  . simvastatin (ZOCOR) 80 MG tablet TAKE ONE TABLET BY MOUTH AT BEDTIME. 30 tablet 0  . sitaGLIPtin (JANUVIA) 50 MG tablet Take 1 tablet (50 mg total) by mouth daily. 30 tablet 1  . VENTOLIN HFA 108 (90 Base) MCG/ACT inhaler INHALE 2 PUFFS BY MOUTH EVERY 4 HOURS AS NEEDED 18 each 5  . [DISCONTINUED] omeprazole (PRILOSEC OTC) 20 MG tablet Take 1 tablet (20 mg total) by mouth daily as needed. 90 tablet 1   No current facility-administered medications on file prior to visit.     BP 122/82   Pulse 78   Temp 98.1 F (36.7 C) (Oral)   Ht 5' 4.75" (1.645 m)   Wt 137 lb 1.9 oz (62.2 kg)   SpO2 98%   BMI 22.99 kg/m    Objective:   Physical Exam  Constitutional: She appears well-nourished.  Neck: Neck supple.  Cardiovascular: Normal rate and regular rhythm.  Pulmonary/Chest: Effort normal and breath sounds normal.  Skin: Skin is warm and dry.  Psychiatric: She has a normal mood and affect.          Assessment & Plan:

## 2017-05-31 NOTE — Assessment & Plan Note (Addendum)
Compliant to Effexor, using Xanax twice daily scheduled. Discussed that I do not support daily benzo use, also discussed potential long term risks including dependence/addiction, mental changes.   Will increase dose of Effexor to help control daily anxiety, also trial low dose hydroxyzine as needed for breakthrough anxiety or sleep. She will update.

## 2017-05-31 NOTE — Assessment & Plan Note (Signed)
Recent lipid panel unremarkable, continue simvastatin 80 mg.

## 2017-06-01 ENCOUNTER — Other Ambulatory Visit: Payer: Self-pay | Admitting: Family Medicine

## 2017-06-01 ENCOUNTER — Telehealth: Payer: Self-pay | Admitting: Primary Care

## 2017-06-01 DIAGNOSIS — E0800 Diabetes mellitus due to underlying condition with hyperosmolarity without nonketotic hyperglycemic-hyperosmolar coma (NKHHC): Secondary | ICD-10-CM

## 2017-06-01 DIAGNOSIS — F411 Generalized anxiety disorder: Secondary | ICD-10-CM

## 2017-06-01 NOTE — Telephone Encounter (Signed)
I spoke with pt; pt started Hydroxyzine instead of Xanax. Today pt said she did not sleep last night; FBS this morning was 198; pt thinks BS elevated due to stress and not sleeping last night. Pt felt like mind wandering due to not taking the Xanax. Pt does not want to start having panic attacks again and has been taking the Xanax since 1993. Pt request to restart the Xanax and pt wants to see a doctor rather than a NP since Mayra ReelKate Clark NP does not want to prescribe Xanax for long term. Pt request cb. [facility].

## 2017-06-01 NOTE — Telephone Encounter (Signed)
KC-Plz see refill req/thx dmf 

## 2017-06-01 NOTE — Telephone Encounter (Signed)
Copied from CRM [phone]. Topic: Quick Communication - See Telephone Encounter >> Jun 01, 2017 11:37 AM Oneal GroutSebastian, Jennifer S wrote: CRM for notification. See Telephone encounter for:  [facility], saw new provider, Vernona RiegerKatherine Clark and was put on hydroxyzine. Has some questions.  06/01/17.

## 2017-06-01 NOTE — Telephone Encounter (Signed)
Please notify patient: I'm willing to provide her with a short term course of Xanax to help wean her off, but I will not continue to prescribe Xanax for daily use. We will slowly wean her down but the goal will be to completely come off. I recommend she increase her venlafaxine to 37.5 mg (1 tablet) twice daily as discussed yesterday, this will help to better control anxiety in the long run. Let me know what she decides.

## 2017-06-01 NOTE — Telephone Encounter (Signed)
Refill sent to pharmacy.   

## 2017-06-02 ENCOUNTER — Telehealth: Payer: Self-pay | Admitting: Primary Care

## 2017-06-02 MED ORDER — ALPRAZOLAM 0.25 MG PO TABS
ORAL_TABLET | ORAL | 0 refills | Status: DC
Start: 2017-06-02 — End: 2018-11-29

## 2017-06-02 NOTE — Telephone Encounter (Signed)
Copied from CRM [id number]. Topic: General - Other >> Jun 02, 2017  9:38 AM Gerrianne ScalePayne, Angeles Zehner L wrote: Reason for CRM: patient calling stating that [facility] due to medication management she states that she is diabetic and has a hard time sleeping and that Chestine Sporelark had took her off of Xanax which help her sleep at night she states that she has not had a good night rest she had put patient on another medication and its not working I had advise pt to schedule an appointment with Chestine Sporelark to talk to her about this situation she doesn't want to talk to her because provider was against her taking it stating that she doesn't like this medication per patient

## 2017-06-02 NOTE — Telephone Encounter (Signed)
This was supposed to be discarded since this was addressed in another encounter.

## 2017-06-02 NOTE — Telephone Encounter (Signed)
Noted, will reduce dose of alprazolam to 0.25 mg. Take 1 half tablet by mouth twice daily. #15, no refills. Next refill will be for 0.25 mg, 1/2 tablet once daily.

## 2017-06-02 NOTE — Telephone Encounter (Signed)
Called in medication to the pharmacy as instructed. 

## 2017-06-02 NOTE — Telephone Encounter (Signed)
I do not feel that a change in provider is appropriate. Mayra ReelKate Clark has provided patient with a treatment plan and I suggest she give it 4-6 weeks to see if working.

## 2017-06-02 NOTE — Telephone Encounter (Signed)
Please see phone note in system regarding Xanax use. I will agree to wean her off of her Xanax with the goal of coming off. Please notify me if she's agreeable.

## 2017-06-02 NOTE — Telephone Encounter (Signed)
Spoken and notified patient of Kate's comments. Patient verbalized understanding. 

## 2017-06-02 NOTE — Telephone Encounter (Signed)
Spoken and notified patient of Kate's comments. Patient verbalized understanding.  [facility]. Patient have already started to take her venlafaxine 37.5 BID. Patient stated that if we can call her when the Xanax is called in.

## 2017-06-06 ENCOUNTER — Other Ambulatory Visit: Payer: Self-pay | Admitting: Primary Care

## 2017-06-06 DIAGNOSIS — E119 Type 2 diabetes mellitus without complications: Secondary | ICD-10-CM | POA: Diagnosis not present

## 2017-06-06 NOTE — Telephone Encounter (Signed)
[facility] requesting to pick up glipizide at pharmacy now. Pt seen 05/31/17 and pt to continue glipizide. Will refill per protocol and andra will notify pt.[facility] as [facility].

## 2017-07-03 ENCOUNTER — Other Ambulatory Visit: Payer: Self-pay | Admitting: Family Medicine

## 2017-07-03 DIAGNOSIS — E0800 Diabetes mellitus due to underlying condition with hyperosmolarity without nonketotic hyperglycemic-hyperosmolar coma (NKHHC): Secondary | ICD-10-CM

## 2017-07-03 NOTE — Telephone Encounter (Signed)
Refill sent to pharmacy.   

## 2017-07-17 ENCOUNTER — Other Ambulatory Visit: Payer: Self-pay | Admitting: Primary Care

## 2017-07-31 ENCOUNTER — Other Ambulatory Visit: Payer: Self-pay | Admitting: Primary Care

## 2017-08-08 ENCOUNTER — Ambulatory Visit: Payer: BLUE CROSS/BLUE SHIELD

## 2017-08-10 ENCOUNTER — Ambulatory Visit: Payer: BLUE CROSS/BLUE SHIELD | Admitting: Primary Care

## 2017-08-28 ENCOUNTER — Other Ambulatory Visit: Payer: Self-pay | Admitting: Primary Care

## 2017-08-28 DIAGNOSIS — F411 Generalized anxiety disorder: Secondary | ICD-10-CM

## 2017-08-31 ENCOUNTER — Other Ambulatory Visit: Payer: Self-pay | Admitting: Family Medicine

## 2017-08-31 DIAGNOSIS — E0800 Diabetes mellitus due to underlying condition with hyperosmolarity without nonketotic hyperglycemic-hyperosmolar coma (NKHHC): Secondary | ICD-10-CM

## 2017-08-31 NOTE — Telephone Encounter (Signed)
Needs office visit follow up for diabetes in May, please schedule. Refills sent to pharmacy.

## 2017-08-31 NOTE — Telephone Encounter (Signed)
KC-Plz see refill req/thx dmf 

## 2017-09-01 NOTE — Telephone Encounter (Signed)
Message left for patient to return my call.  

## 2017-09-05 NOTE — Telephone Encounter (Signed)
Message left for patient to return my call.  

## 2017-09-06 NOTE — Telephone Encounter (Signed)
Sending letter for patient to call to schedule appointment

## 2017-11-02 ENCOUNTER — Other Ambulatory Visit: Payer: Self-pay | Admitting: Emergency Medicine

## 2017-11-02 ENCOUNTER — Other Ambulatory Visit: Payer: Self-pay | Admitting: Primary Care

## 2017-11-02 DIAGNOSIS — Z1231 Encounter for screening mammogram for malignant neoplasm of breast: Secondary | ICD-10-CM

## 2017-11-07 ENCOUNTER — Encounter: Payer: Self-pay | Admitting: Emergency Medicine

## 2017-11-07 ENCOUNTER — Emergency Department
Admission: EM | Admit: 2017-11-07 | Discharge: 2017-11-07 | Disposition: A | Discharge status: Home / Self Care | Payer: BLUE CROSS/BLUE SHIELD | Attending: Emergency Medicine | Admitting: Emergency Medicine

## 2017-11-07 ENCOUNTER — Other Ambulatory Visit: Payer: Self-pay

## 2017-11-07 ENCOUNTER — Emergency Department: Payer: BLUE CROSS/BLUE SHIELD

## 2017-11-07 DIAGNOSIS — Z79899 Other long term (current) drug therapy: Secondary | ICD-10-CM | POA: Insufficient documentation

## 2017-11-07 DIAGNOSIS — Z7982 Long term (current) use of aspirin: Secondary | ICD-10-CM | POA: Diagnosis not present

## 2017-11-07 DIAGNOSIS — E871 Hypo-osmolality and hyponatremia: Secondary | ICD-10-CM | POA: Diagnosis not present

## 2017-11-07 DIAGNOSIS — Z87891 Personal history of nicotine dependence: Secondary | ICD-10-CM | POA: Diagnosis not present

## 2017-11-07 DIAGNOSIS — Z7984 Long term (current) use of oral hypoglycemic drugs: Secondary | ICD-10-CM | POA: Diagnosis not present

## 2017-11-07 DIAGNOSIS — E119 Type 2 diabetes mellitus without complications: Secondary | ICD-10-CM | POA: Insufficient documentation

## 2017-11-07 DIAGNOSIS — N201 Calculus of ureter: Secondary | ICD-10-CM | POA: Diagnosis not present

## 2017-11-07 DIAGNOSIS — R109 Unspecified abdominal pain: Secondary | ICD-10-CM | POA: Diagnosis present

## 2017-11-07 DIAGNOSIS — J449 Chronic obstructive pulmonary disease, unspecified: Secondary | ICD-10-CM | POA: Insufficient documentation

## 2017-11-07 LAB — URINALYSIS, COMPLETE (UACMP) WITH MICROSCOPIC
BILIRUBIN URINE: NEGATIVE
Glucose, UA: >=500 mg/dL — AB
KETONES UR: 20 mg/dL — AB
LEUKOCYTES UA: NEGATIVE
NITRITE: NEGATIVE
PH: 8 (ref 5.0–8.0)
Protein, ur: NEGATIVE mg/dL
Specific Gravity, Urine: 1.01 (ref 1.005–1.030)

## 2017-11-07 LAB — COMPREHENSIVE METABOLIC PANEL
ALT: 19 U/L (ref 14–54)
AST: 45 U/L — ABNORMAL HIGH (ref 15–41)
Albumin: 5.3 g/dL — ABNORMAL HIGH (ref 3.5–5.0)
Alkaline Phosphatase: 81 U/L (ref 38–126)
Anion gap: 14 (ref 5–15)
BILIRUBIN TOTAL: 0.8 mg/dL (ref 0.3–1.2)
BUN: 11 mg/dL (ref 6–20)
CO2: 23 mmol/L (ref 22–32)
Calcium: 9.7 mg/dL (ref 8.9–10.3)
Chloride: 90 mmol/L — ABNORMAL LOW (ref 101–111)
Creatinine, Ser: 0.81 mg/dL (ref 0.44–1.00)
GFR calc non Af Amer: >60 mL/min (ref >60)
Glucose, Bld: 206 mg/dL — ABNORMAL HIGH (ref 65–99)
POTASSIUM: 4.1 mmol/L (ref 3.5–5.1)
Sodium: 127 mmol/L — ABNORMAL LOW (ref 135–145)
TOTAL PROTEIN: 8.2 g/dL — AB (ref 6.5–8.1)

## 2017-11-07 LAB — CBC
HEMATOCRIT: 41.2 % (ref 35.0–47.0)
HEMOGLOBIN: 13.8 g/dL (ref 12.0–16.0)
MCH: 29.2 pg (ref 26.0–34.0)
MCHC: 33.5 g/dL (ref 32.0–36.0)
MCV: 87.2 fL (ref 80.0–100.0)
Platelets: 329 10*3/uL (ref 150–440)
RBC: 4.72 MIL/uL (ref 3.80–5.20)
RDW: 14.6 % — AB (ref 11.5–14.5)
WBC: 13.2 10*3/uL — AB (ref 3.6–11.0)

## 2017-11-07 LAB — BASIC METABOLIC PANEL
Anion gap: 10 (ref 5–15)
BUN: 9 mg/dL (ref 6–20)
CALCIUM: 8.2 mg/dL — AB (ref 8.9–10.3)
CO2: 23 mmol/L (ref 22–32)
CREATININE: 0.75 mg/dL (ref 0.44–1.00)
Chloride: 93 mmol/L — ABNORMAL LOW (ref 101–111)
GFR calc non Af Amer: >60 mL/min (ref >60)
Glucose, Bld: 127 mg/dL — ABNORMAL HIGH (ref 65–99)
Potassium: 3.8 mmol/L (ref 3.5–5.1)
Sodium: 126 mmol/L — ABNORMAL LOW (ref 135–145)

## 2017-11-07 LAB — LIPASE, BLOOD: Lipase: 33 U/L (ref 11–51)

## 2017-11-07 MED ORDER — OXYCODONE-ACETAMINOPHEN 5-325 MG PO TABS: 1.0000 | ORAL_TABLET | Freq: Four times a day (QID) | ORAL | 0 refills | Status: AC | PRN

## 2017-11-07 MED ORDER — ONDANSETRON HCL 4 MG/2ML IJ SOLN
INTRAMUSCULAR | Status: AC
  Filled 2017-11-07: qty 2

## 2017-11-07 MED ORDER — ONDANSETRON HCL 4 MG PO TABS: 4.0000 mg | ORAL_TABLET | Freq: Three times a day (TID) | ORAL | 0 refills | Status: AC | PRN

## 2017-11-07 MED ORDER — IOPAMIDOL (ISOVUE-300) INJECTION 61%
30.0000 mL | Freq: Once | INTRAVENOUS | Status: AC | PRN
  Administered 2017-11-07: 30 mL via ORAL

## 2017-11-07 MED ORDER — SODIUM CHLORIDE 0.9 % IV BOLUS
1000.0000 mL | Freq: Once | INTRAVENOUS | Status: AC
  Administered 2017-11-07: 1000 mL via INTRAVENOUS

## 2017-11-07 MED ORDER — TAMSULOSIN HCL 0.4 MG PO CAPS: 0.4000 mg | ORAL_CAPSULE | Freq: Every day | ORAL | 0 refills | Status: AC

## 2017-11-07 MED ORDER — OXYCODONE-ACETAMINOPHEN 5-325 MG PO TABS
ORAL_TABLET | ORAL | Status: AC
  Administered 2017-11-07: 1 via ORAL
  Filled 2017-11-07: qty 1

## 2017-11-07 MED ORDER — IOHEXOL 300 MG/ML  SOLN
100.0000 mL | Freq: Once | INTRAMUSCULAR | Status: AC | PRN
  Administered 2017-11-07: 100 mL via INTRAVENOUS

## 2017-11-07 MED ORDER — SODIUM CHLORIDE 0.9 % IV BOLUS
500.0000 mL | Freq: Once | INTRAVENOUS | Status: AC
  Administered 2017-11-07: 500 mL via INTRAVENOUS

## 2017-11-07 MED ORDER — OXYCODONE-ACETAMINOPHEN 5-325 MG PO TABS
1.0000 | ORAL_TABLET | Freq: Once | ORAL | Status: AC
  Administered 2017-11-07: 1 via ORAL

## 2017-11-07 MED ORDER — ONDANSETRON HCL 4 MG/2ML IJ SOLN
4.0000 mg | Freq: Once | INTRAMUSCULAR | Status: AC
  Administered 2017-11-07: 4 mg via INTRAVENOUS

## 2017-11-07 NOTE — ED Triage Notes (Signed)
Pt reports started with abdominal pain since last pm and her MD advised her to come to the ED for a scan.

## 2017-11-07 NOTE — Discharge Instructions (Signed)
Call your doctor tomorrow to arrange for a follow-up within the next week due to the low sodium level today.  Take the Flomax as prescribed to help the stone pass.  You may use the oxycodone as needed for pain and the Zofran as needed for nausea.  Return to the ER for new or worsening pain, fevers, vomiting, generalized weakness, lightheadedness, or any other new or worsening symptoms that concern you.

## 2017-11-07 NOTE — ED Provider Notes (Signed)
[facility] Emergency Department Provider Note ____________________________________________   First MD Initiated Contact with Patient 11/07/17 1111     (approximate)  I have reviewed the triage vital signs and the nursing notes.   HISTORY  Chief Complaint Abdominal Pain    HPI Sara Stafford is a 72 y.o. female with PMH as noted below who presents with left lower quadrant abdominal pain, acute onset last night, and now almost completely resolved, associated with nausea but no vomiting, and not associated with fever or any urinary symptoms.  Patient states that she normally has a bowel movement in the morning and has not had one today.  Her last bowel movement yesterday was normal.  No prior history of this pain.  Past Medical History:  Diagnosis Date  . COPD (chronic obstructive pulmonary disease) (HCC)    no change with spiriva, poor tolerance of advair  . Depressive disorder, not elsewhere classified   . Diabetes mellitus   . Fatigue   . Fibromyalgia   . GERD (gastroesophageal reflux disease)   . Hyperlipidemia   . Routine general medical examination at a health care facility     Patient Active Problem List   Diagnosis Date Noted  . GAD (generalized anxiety disorder) 05/31/2017  . Vitamin D deficiency 08/03/2016  . Well woman exam 07/29/2016  . Osteoporosis 10/05/2015  . Macular degeneration 06/02/2014  . MAMMOGRAM, ABNORMAL, LEFT 09/10/2009  . Pulmonary nodules 03/20/2009  . ANEMIAS DUE TO DISORDERS GLUTATHIONE METABOLISM 07/31/2008  . COPD GOLD II  10/11/2007  . Diabetes (HCC) 06/04/2007  . HYPERLIPIDEMIA 06/04/2007  . Depression 06/04/2007  . GERD 06/04/2007  . FIBROMYALGIA 06/04/2007    Past Surgical History:  Procedure Laterality Date  . ABDOMINAL HYSTERECTOMY     partial  . CATARACT EXTRACTION Left 02/09/2015  . CATARACT EXTRACTION Right 01/26/2015    Prior to Admission medications   Medication Sig Start Date End Date  Taking? Authorizing Provider  ALPRAZolam Prudy Feeler) 0.25 MG tablet Take one half tablet by mouth twice daily as needed for anxiety. Use sparingly. 06/02/17   Doreene Nest, NP  aspirin 81 MG tablet Take 81 mg by mouth as needed.      [provider]  cetirizine (ZYRTEC) 10 MG tablet TAKE 1 TABLET BY MOUTH ONCE DAILY. 05/31/17   Doreene Nest, NP  glipiZIDE (GLUCOTROL XL) 10 MG 24 hr tablet TAKE (2) TABLETS BY MOUTH ONCE DAILY. 11/02/17   Doreene Nest, NP  glucose blood (BAYER CONTOUR NEXT TEST) test strip USE TO CHECK BLOOD SUGAR ONCE A DAY 08/04/16   Dianne Dun, MD  hydrOXYzine (ATARAX/VISTARIL) 10 MG tablet Take 1 tablet by mouth once to twice daily as needed for anxiety. 05/31/17   Doreene Nest, NP  JANUVIA 50 MG tablet TAKE 1 TABLET BY MOUTH ONCE DAILY. 06/01/17   Doreene Nest, NP  Lancet Device MISC USE DAILY TO OBTAIN BLOOD SUGAR Dx Code Z61.09 08/04/16   Dianne Dun, MD  metFORMIN (GLUCOPHAGE) 1000 MG tablet TAKE (1) TABLET TWICE A DAY WITH FOOD---BREAKFAST AND SUPPER. 08/31/17   Doreene Nest, NP  mometasone-formoterol (DULERA) 100-5 MCG/ACT AERO Inhale 2 puffs into the lungs 2 (two) times daily. 05/24/17   Dianne Dun, MD  montelukast (SINGULAIR) 10 MG tablet TAKE ONE TABLET BY MOUTH AT BEDTIME. 05/31/17   Doreene Nest, NP  omeprazole (PRILOSEC) 20 MG capsule TAKE (1) CAPSULE BY MOUTH ONCE DAILY AS NEEDED. 08/01/17   Chestine Spore,  Keane ScrapeKatherine K, NP  ondansetron (ZOFRAN) 4 MG tablet Take 1 tablet (4 mg total) by mouth every 8 (eight) hours as needed for up to 5 days for nausea or vomiting. 11/07/17 11/12/17  Dionne BucySiadecki, Kalai Baca, MD  oxyCODONE-acetaminophen (PERCOCET) 5-325 MG tablet Take 1 tablet by mouth every 6 (six) hours as needed for up to 5 days for severe pain. 11/07/17 11/12/17  Dionne BucySiadecki, Catalia Massett, MD  simvastatin (ZOCOR) 80 MG tablet Take 1 tablet (80 mg total) by mouth at bedtime. 07/17/17   Doreene Nestlark, Katherine K, NP  tamsulosin (FLOMAX) 0.4 MG CAPS capsule Take 1  capsule (0.4 mg total) by mouth daily after supper for 7 days. 11/07/17 11/14/17  Dionne BucySiadecki, Latavia Goga, MD  venlafaxine (EFFEXOR) 37.5 MG tablet TAKE 1 TABLET BY MOUTH TWICE DAILY 08/28/17   Doreene Nestlark, Katherine K, NP  VENTOLIN HFA 108 (90 Base) MCG/ACT inhaler INHALE 2 PUFFS BY MOUTH EVERY 4 HOURS AS NEEDED 06/21/16   Dianne DunAron, Talia M, MD  omeprazole (PRILOSEC OTC) 20 MG tablet Take 1 tablet (20 mg total) by mouth daily as needed. 02/29/12 10/15/12  Dianne DunAron, Talia M, MD    Allergies Doxycycline  Family History  Problem Relation Age of Onset  . Stroke Father [id number]  . Diabetes Father   . Asthma Father   . Liver cancer Sister [id number]  . Pancreatic cancer Sister 972  . Brain cancer Brother   . Diabetes Sister   . Diabetes Sister   . Diabetes Sister   . Diabetes Sister   . Diabetes Sister   . Diabetes Sister   . Diabetes Sister   . Diabetes Paternal Grandmother   . Breast cancer Maternal Aunt [phone]    Social History Social History   Tobacco Use  . Smoking status: Former Smoker    Packs/day: 1.00    Years: 50.00    Pack years: 50.00    Last attempt to quit: 06/06/2010    Years since quitting: 7.4  . Smokeless tobacco: Never Used  Substance Use Topics  . Alcohol use: No    Alcohol/week: 0.0 oz  . Drug use: No    Review of Systems  Constitutional: No fever. Eyes: No redness. ENT: No sore throat. Cardiovascular: Denies chest pain. Respiratory: Denies shortness of breath. Gastrointestinal: Positive for nausea.  Genitourinary: Negative for dysuria.  Musculoskeletal: Negative for back pain. Skin: Negative for rash. Neurological: Negative for headache.   ____________________________________________   PHYSICAL EXAM:  VITAL SIGNS: ED Triage Vitals [11/07/17 1053]  Enc Vitals Group     BP (!) 169/89     Pulse Rate (!) 106     Resp 18     Temp 98.2 F (36.8 C)     Temp Source Oral     SpO2 98 %     Weight 140 lb (63.5 kg)     Height 5\' 5"  (1.651 m)     Head Circumference      Peak Flow        Pain Score 5     Pain Loc      Pain Edu?      Excl. in GC?     Constitutional: Alert and oriented. Well appearing and in no acute distress. Eyes: Conjunctivae are normal.  Head: Atraumatic. Nose: No congestion/rhinnorhea. Mouth/Throat: Mucous membranes are moist.   Neck: Normal range of motion.  Cardiovascular: Good peripheral circulation. Respiratory: Normal respiratory effort.  Gastrointestinal: Mild left lower quadrant and left flank tenderness. No distention.  Genitourinary: No CVA tenderness. Musculoskeletal:  Extremities warm  and well perfused.  Neurologic:  Normal speech and language. No gross focal neurologic deficits are appreciated.  Skin:  Skin is warm and dry. No rash noted. Psychiatric: Mood and affect are normal. Speech and behavior are normal.  ____________________________________________   LABS (all labs ordered are listed, but only abnormal results are displayed)  Labs Reviewed  COMPREHENSIVE METABOLIC PANEL - Abnormal; Notable for the following components:      Result Value   Sodium 127 (*)    Chloride 90 (*)    Glucose, Bld 206 (*)    Total Protein 8.2 (*)    Albumin 5.3 (*)    AST 45 (*)    All other components within normal limits  CBC - Abnormal; Notable for the following components:   WBC 13.2 (*)    RDW 14.6 (*)    All other components within normal limits  URINALYSIS, COMPLETE (UACMP) WITH MICROSCOPIC - Abnormal; Notable for the following components:   Color, Urine YELLOW (*)    APPearance CLEAR (*)    Glucose, UA >=500 (*)    Hgb urine dipstick SMALL (*)    Ketones, ur 20 (*)    Bacteria, UA RARE (*)    All other components within normal limits  BASIC METABOLIC PANEL - Abnormal; Notable for the following components:   Sodium 126 (*)    Chloride 93 (*)    Glucose, Bld 127 (*)    Calcium 8.2 (*)    All other components within normal limits  LIPASE, BLOOD    ____________________________________________  EKG   ____________________________________________  RADIOLOGY  CT abdomen: 4 mm left proximal ureteral stone with hydronephrosis  ____________________________________________   PROCEDURES  Procedure(s) performed: No  Procedures  Critical Care performed: No ____________________________________________   INITIAL IMPRESSION / ASSESSMENT AND PLAN / ED COURSE  Pertinent labs & imaging results that were available during my care of the patient were reviewed by me and considered in my medical decision making (see chart for details).  72 year old female with PMH as noted above presents with left lower quadrant abdominal pain since last night which is now mostly resolved.  I reviewed the past medical records in Epic; the patient has had no recent ED visits or admissions.  On exam she does have some tenderness in the left lower quadrant.  Initial work-up reveals WBC count of 13 and hematuria.  Differential includes ureteral stone which has possibly passed, diverticulitis, colitis, constipation, or other benign etiology.  We will obtain CT abdomen and reassess.  ----------------------------------------- 1:52 PM on 11/07/2017 -----------------------------------------  CT shows ureteral stone which is consistent with patient's UA.  There is no evidence of infection.  The CMP reveals hyponatremia compared to patient's baseline.  Some of this could be related to her elevated glucose, but her corrected and a is 130.  We will give 2 L of NS and repeat the sodium.  I suspect that the hyponatremia is most likely related to dehydration and hyperglycemia patient states that she checks her sugar only infrequently.  ----------------------------------------- 5:39 PM on 11/07/2017 -----------------------------------------  Repeat BMP after 2.5 L of NS shows no significant improvement in the sodium.  However, the patient is completely asymptomatic  and would like to go home.  Since the sodium is not critically low, and the patient has no significant symptoms, discharged with close outpatient follow-up would be appropriate.  I instructed the patient to contact her PMD this week to arrange for follow-up to get repeat labs.  I advised her on  the results of the work-up and the plan of care.  Return precautions given, and she expresses understanding.  ____________________________________________   FINAL CLINICAL IMPRESSION(S) / ED DIAGNOSES  Final diagnoses:  Ureteral stone  Hyponatremia      NEW MEDICATIONS STARTED DURING THIS VISIT:  New Prescriptions   ONDANSETRON (ZOFRAN) 4 MG TABLET    Take 1 tablet (4 mg total) by mouth every 8 (eight) hours as needed for up to 5 days for nausea or vomiting.   OXYCODONE-ACETAMINOPHEN (PERCOCET) 5-325 MG TABLET    Take 1 tablet by mouth every 6 (six) hours as needed for up to 5 days for severe pain.   TAMSULOSIN (FLOMAX) 0.4 MG CAPS CAPSULE    Take 1 capsule (0.4 mg total) by mouth daily after supper for 7 days.     Note:  This document was prepared using Dragon voice recognition software and may include unintentional dictation errors.    Dionne Bucy, MD 11/07/17 1740

## 2017-11-07 NOTE — ED Triage Notes (Signed)
Pt c/o abdominal pain that started around 8pm last night.  Pt states she last had BM yesterday morning and usually goes in the morning. Pt states she has not gone this morning.  Pt states the pain is in lower abdomen.  Denies dysuria.  Pt states [facility] and referred to ED for a scan.  Pt denies vomiting but states she has had dry heaves.

## 2017-11-24 ENCOUNTER — Ambulatory Visit
Admission: RE | Admit: 2017-11-24 | Discharge: 2017-11-24 | Disposition: A | Discharge status: Home / Self Care | Payer: BLUE CROSS/BLUE SHIELD | Source: Ambulatory Visit | Attending: Emergency Medicine | Admitting: Emergency Medicine

## 2017-11-24 DIAGNOSIS — Z1231 Encounter for screening mammogram for malignant neoplasm of breast: Secondary | ICD-10-CM | POA: Diagnosis not present

## 2017-12-05 DIAGNOSIS — E119 Type 2 diabetes mellitus without complications: Secondary | ICD-10-CM | POA: Diagnosis not present

## 2017-12-19 ENCOUNTER — Other Ambulatory Visit: Payer: Self-pay | Admitting: Primary Care

## 2018-01-02 ENCOUNTER — Other Ambulatory Visit: Payer: Self-pay | Admitting: Primary Care

## 2018-02-02 ENCOUNTER — Other Ambulatory Visit: Payer: Self-pay | Admitting: Primary Care

## 2018-02-05 ENCOUNTER — Other Ambulatory Visit: Payer: Self-pay | Admitting: Primary Care

## 2018-02-19 DIAGNOSIS — H524 Presbyopia: Secondary | ICD-10-CM | POA: Diagnosis not present

## 2018-02-19 DIAGNOSIS — H35023 Exudative retinopathy, bilateral: Secondary | ICD-10-CM | POA: Diagnosis not present

## 2018-03-02 ENCOUNTER — Other Ambulatory Visit: Payer: Self-pay | Admitting: Primary Care

## 2018-03-02 DIAGNOSIS — E0800 Diabetes mellitus due to underlying condition with hyperosmolarity without nonketotic hyperglycemic-hyperosmolar coma (NKHHC): Secondary | ICD-10-CM

## 2018-03-15 ENCOUNTER — Other Ambulatory Visit: Payer: Self-pay | Admitting: Primary Care

## 2018-03-26 DIAGNOSIS — Z23 Encounter for immunization: Secondary | ICD-10-CM | POA: Diagnosis not present

## 2018-03-29 ENCOUNTER — Other Ambulatory Visit: Payer: Self-pay | Admitting: Primary Care

## 2018-05-07 ENCOUNTER — Other Ambulatory Visit: Payer: Self-pay | Admitting: Primary Care

## 2018-05-24 DIAGNOSIS — M1711 Unilateral primary osteoarthritis, right knee: Secondary | ICD-10-CM | POA: Diagnosis not present

## 2018-06-05 DIAGNOSIS — E119 Type 2 diabetes mellitus without complications: Secondary | ICD-10-CM | POA: Diagnosis not present

## 2018-11-29 ENCOUNTER — Ambulatory Visit (INDEPENDENT_AMBULATORY_CARE_PROVIDER_SITE_OTHER): Payer: Medicare Other | Admitting: Family Medicine

## 2018-11-29 ENCOUNTER — Other Ambulatory Visit: Payer: Self-pay

## 2018-11-29 ENCOUNTER — Encounter: Payer: Self-pay | Admitting: Family Medicine

## 2018-11-29 VITALS — BP 130/72 | HR 96 | Temp 97.8°F | Ht 64.5 in | Wt 149.0 lb

## 2018-11-29 DIAGNOSIS — M1711 Unilateral primary osteoarthritis, right knee: Secondary | ICD-10-CM

## 2018-11-29 MED ORDER — METHYLPREDNISOLONE ACETATE 40 MG/ML IJ SUSP
80.0000 mg | Freq: Once | INTRAMUSCULAR | Status: AC
  Administered 2018-11-29: 80 mg via INTRA_ARTICULAR

## 2018-11-29 NOTE — Progress Notes (Signed)
Ripley Lovecchio T. Gabriellah Rabel, MD Primary Care and Sports Medicine [facility] [place], [address] Phone: [phone]  FAX: [phone]  Sara Stafford - 73 y.o. female  MRN [id number]  Date of Birth: Dec 31, 1945  Visit Date: 11/29/2018  PCP: Vidal Schwalbe, MD  Referred by: Vidal Schwalbe, MD  Chief Complaint  Patient presents with  . Knee Pain    Right   Subjective:   Sara Stafford is a 73 y.o. very pleasant female patient who presents with the following:  Pain all over the R knee.  Has to sit and rub it and then will have to use ice and cannot walk all that well.   Saw Tye [place]. Did have some OA.  At that point he did an intra-articular injection of the right knee, and she did a lot better.  She was fairly asymptomatic for 7 or 8 months.  She is here today following up with me regarding this.  She rarely will take some oral ibuprofen and Tylenol.  She also has some diclofenac gel as well as uses some topical lidocaine.  She also has tried some CBD cream.  Voltaren Gel Uses [employer]  Going out of the house sometimes.  Sometime will give out.    r knee inj  Past Medical History, Surgical History, Social History, Family History, Problem List, Medications, and Allergies have been reviewed and updated if relevant.  Patient Active Problem List   Diagnosis Date Noted  . GAD (generalized anxiety disorder) 05/31/2017  . Vitamin D deficiency 08/03/2016  . Well woman exam 07/29/2016  . Osteoporosis 10/05/2015  . Macular degeneration 06/02/2014  . MAMMOGRAM, ABNORMAL, LEFT 09/10/2009  . Pulmonary nodules 03/20/2009  . ANEMIAS DUE TO DISORDERS GLUTATHIONE METABOLISM 07/31/2008  . COPD GOLD II  10/11/2007  . Diabetes ([place]) 06/04/2007  . HYPERLIPIDEMIA 06/04/2007  . Depression 06/04/2007  . GERD 06/04/2007  . FIBROMYALGIA 06/04/2007    Past Medical History:  Diagnosis Date  . COPD (chronic obstructive pulmonary  disease) (HCC)    no change with spiriva, poor tolerance of advair  . Depressive disorder, not elsewhere classified   . Diabetes mellitus   . Fatigue   . Fibromyalgia   . GERD (gastroesophageal reflux disease)   . Hyperlipidemia   . Routine general medical examination at a health care facility     Past Surgical History:  Procedure Laterality Date  . ABDOMINAL HYSTERECTOMY     partial  . CATARACT EXTRACTION Left 02/09/2015  . CATARACT EXTRACTION Right 01/26/2015    Social History   Socioeconomic History  . Marital status: Married    Spouse name: Not on file  . Number of children: 2  . Years of education: Not on file  . Highest education level: Not on file  Occupational History  . Occupation: retired    Fish farm manager: RETIRED    Comment: [employer]  Social Needs  . Financial resource strain: Not on file  . Food insecurity    Worry: Not on file    Inability: Not on file  . Transportation needs    Medical: Not on file    Non-medical: Not on file  Tobacco Use  . Smoking status: Former Smoker    Packs/day: 1.00    Years: 50.00    Pack years: 50.00    Quit date: 06/06/2010    Years since quitting: 8.4  . Smokeless tobacco: Never Used  Substance and Sexual Activity  .  Alcohol use: No    Alcohol/week: 0.0 standard drinks  . Drug use: No  . Sexual activity: Never  Lifestyle  . Physical activity    Days per week: Not on file    Minutes per session: Not on file  . Stress: Not on file  Relationships  . Social Herbalist on phone: Not on file    Gets together: Not on file    Attends religious service: Not on file    Active member of club or organization: Not on file    Attends meetings of clubs or organizations: Not on file    Relationship status: Not on file  . Intimate partner violence    Fear of current or ex partner: Not on file    Emotionally abused: Not on file    Physically abused: Not on file    Forced sexual activity: Not on file  Other Topics  Concern  . Not on file  Social History Narrative   Children aged 103, and 41, expecting 2nd grandchild    Family History  Problem Relation Age of Onset  . Stroke Father 36  . Diabetes Father   . Asthma Father   . Liver cancer Sister 34  . Pancreatic cancer Sister 34  . Brain cancer Brother   . Diabetes Sister   . Diabetes Sister   . Diabetes Sister   . Diabetes Sister   . Diabetes Sister   . Diabetes Sister   . Diabetes Sister   . Diabetes Paternal Grandmother   . Breast cancer Maternal Aunt 70    Allergies  Allergen Reactions  . Doxycycline     REACTION: thrush    Medication list reviewed and updated in full in [place].  GEN: No fevers, chills. Nontoxic. Primarily MSK c/o today. MSK: Detailed in the HPI GI: tolerating PO intake without difficulty Neuro: No numbness, parasthesias, or tingling associated. Otherwise the pertinent positives of the ROS are noted above.   Objective:   BP 130/72   Pulse 96   Temp 97.8 F (36.6 C) (Temporal)   Ht 5' 4.5" (1.638 m)   Wt 149 lb (67.6 kg)   SpO2 94%   BMI 25.18 kg/m    GEN: WDWN, NAD, Non-toxic, Alert & Oriented x 3 HEENT: Atraumatic, Normocephalic.  Ears and Nose: No external deformity. EXTR: No clubbing/cyanosis/edema NEURO: Normal gait.  PSYCH: Normally interactive. Conversant. Not depressed or anxious appearing.  Calm demeanor.   Knee:  R Gait: Normal heel toe pattern ROM: 0-125 Effusion: neg Echymosis or edema: none Patellar tendon NT Painful PLICA: neg Patellar grind: negative Medial and lateral patellar facet loading: negative medial and lateral joint lines: medial > lateral Mcmurray's neg Flexion-pinch pos Varus and valgus stress: stable Lachman: neg Ant and Post drawer: neg Hip abduction, IR, ER: WNL Hip flexion str: 5/5 Hip abd: 5/5 Quad: 5/5 VMO atrophy:No Hamstring concentric and eccentric: 5/5   Radiology: No results found.  Assessment and Plan:     ICD-10-CM   1.  Primary osteoarthritis of right knee  M17.11 methylPREDNISolone acetate (DEPO-MEDROL) injection 80 mg   Classic knee osteoarthritis with exacerbation.  I reviewed various nonoperative treatments and over-the-counter and herbal remedies with the patient.  Given her functional impairment, think it is reasonable to repeat her intra-articular knee injection.  Aspiration/Injection Procedure Note Sara Stafford September 18, 1945 Date of procedure: 11/29/2018  Procedure: Large Joint Aspiration / Injection of Knee, R Indications: Pain  Procedure Details Patient verbally consented  to procedure. Risks (including potential rare risk of infection), benefits, and alternatives explained. Sterilely prepped with Chloraprep. Ethyl cholride used for anesthesia. 8 cc Lidocaine 1% mixed with 2 mL Depo-Medrol 40 mg injected using the anteromedial approach without difficulty. No complications with procedure and tolerated well. Patient had decreased pain post-injection. Medication: 2 mL of Depo-Medrol 40 mg, equaling Depo-Medrol 80 mg total   Patient Instructions  OSTEOARTHRITIS:  For symptomatic relief:  Tylenol: 2 tablets up to 3-4 times a day Regular NSAIDS are helpful (avoid in kidney disease and ulcers)  Topical Capzaicin Cream, as needed (wear glove to put on) - THIS IS EXCEPTIONALLY HOT Supplements: Tart cherry juice and Curcumin (Turmeric extract) have good scientific evidence  For flares, corticosteroid injections help. Hyaluronic Acid injections have good success, average relief is 6 months  Glucosamine and Chondroitin often helpful - will take about 3 months to see if you have an effect. If you do, great, keep them up, if none at that point, no need to take in the future.  Omega-3 fish oils may help, 2 grams daily  Ice joints on bad days, 20 min, 2-3 x / day REGULAR EXERCISE: swimming, Yoga, Tai Chi, bicycle (NON-IMPACT activity)   Weight loss will always take stress off of the joints and back     Follow-up: No follow-ups on file.  Meds ordered this encounter  Medications  . methylPREDNISolone acetate (DEPO-MEDROL) injection 80 mg   No orders of the defined types were placed in this encounter.   Signed,  Maud Deed. Browning Southwood, MD   Outpatient Encounter Medications as of 11/29/2018  Medication Sig  . amitriptyline (ELAVIL) 25 MG tablet   . aspirin 81 MG tablet Take 81 mg by mouth as needed.    . Blood Glucose Monitoring Suppl (TRUE METRIX METER) w/Device KIT   . diclofenac sodium (VOLTAREN) 1 % GEL   . glipiZIDE (GLUCOTROL XL) 10 MG 24 hr tablet TAKE (2) TABLETS BY MOUTH ONCE DAILY.  . hydrochlorothiazide (MICROZIDE) 12.5 MG capsule   . metFORMIN (GLUCOPHAGE) 1000 MG tablet TAKE (1) TABLET TWICE A DAY WITH FOOD---BREAKFAST AND SUPPER.  . montelukast (SINGULAIR) 10 MG tablet TAKE ONE TABLET BY MOUTH AT BEDTIME.  Marland Kitchen omeprazole (PRILOSEC) 20 MG capsule TAKE (1) CAPSULE BY MOUTH ONCE DAILY AS NEEDED.  Marland Kitchen pioglitazone (ACTOS) 30 MG tablet   . rosuvastatin (CRESTOR) 20 MG tablet   . TRUEplus Lancets 30G MISC   . venlafaxine (EFFEXOR) 37.5 MG tablet TAKE 1 TABLET BY MOUTH TWICE DAILY  . VENTOLIN HFA 108 (90 Base) MCG/ACT inhaler INHALE 2 PUFFS BY MOUTH EVERY 4 HOURS AS NEEDED  . WIXELA INHUB 250-50 MCG/DOSE AEPB TAKE 1 PUFF BY MOUTH TWICE A DAY  . [DISCONTINUED] ALPRAZolam (XANAX) 0.25 MG tablet Take one half tablet by mouth twice daily as needed for anxiety. Use sparingly.  . [DISCONTINUED] cetirizine (ZYRTEC) 10 MG tablet TAKE 1 TABLET BY MOUTH ONCE DAILY.  . [DISCONTINUED] glucose blood (BAYER CONTOUR NEXT TEST) test strip USE TO CHECK BLOOD SUGAR ONCE A DAY  . [DISCONTINUED] hydrOXYzine (ATARAX/VISTARIL) 10 MG tablet Take 1 tablet by mouth once to twice daily as needed for anxiety.  . [DISCONTINUED] JANUVIA 50 MG tablet TAKE 1 TABLET BY MOUTH ONCE DAILY.  . [DISCONTINUED] Lancet Device MISC USE DAILY TO OBTAIN BLOOD SUGAR Dx Code E08.00  . [DISCONTINUED] mometasone-formoterol  (DULERA) 100-5 MCG/ACT AERO Inhale 2 puffs into the lungs 2 (two) times daily.  . [DISCONTINUED] omeprazole (PRILOSEC OTC) 20 MG tablet Take 1  tablet (20 mg total) by mouth daily as needed.  . [DISCONTINUED] simvastatin (ZOCOR) 80 MG tablet Take 1 tablet (80 mg total) by mouth at bedtime.  . [EXPIRED] methylPREDNISolone acetate (DEPO-MEDROL) injection 80 mg    No facility-administered encounter medications on file as of 11/29/2018.

## 2018-11-29 NOTE — Patient Instructions (Signed)
OSTEOARTHRITIS:  For symptomatic relief:  Tylenol: 2 tablets up to 3-4 times a day Regular NSAIDS are helpful (avoid in kidney disease and ulcers)  Topical Capzaicin Cream, as needed (wear glove to put on) - THIS IS EXCEPTIONALLY HOT Supplements: Tart cherry juice and Curcumin (Turmeric extract) have good scientific evidence  For flares, corticosteroid injections help. Hyaluronic Acid injections have good success, average relief is 6 months  Glucosamine and Chondroitin often helpful - will take about 3 months to see if you have an effect. If you do, great, keep them up, if none at that point, no need to take in the future.  Omega-3 fish oils may help, 2 grams daily  Ice joints on bad days, 20 min, 2-3 x / day REGULAR EXERCISE: swimming, Yoga, Tai Chi, bicycle (NON-IMPACT activity)   Weight loss will always take stress off of the joints and back  

## 2019-06-10 ENCOUNTER — Ambulatory Visit (INDEPENDENT_AMBULATORY_CARE_PROVIDER_SITE_OTHER): Payer: Medicare Other | Admitting: Family Medicine

## 2019-06-10 ENCOUNTER — Encounter: Payer: Self-pay | Admitting: Family Medicine

## 2019-06-10 ENCOUNTER — Other Ambulatory Visit: Payer: Self-pay

## 2019-06-10 ENCOUNTER — Ambulatory Visit (INDEPENDENT_AMBULATORY_CARE_PROVIDER_SITE_OTHER)
Admission: RE | Admit: 2019-06-10 | Discharge: 2019-06-10 | Disposition: A | Discharge status: Home / Self Care | Payer: Medicare Other | Source: Ambulatory Visit | Attending: Family Medicine | Admitting: Family Medicine

## 2019-06-10 VITALS — BP 130/60 | HR 102 | Temp 97.5°F | Ht 64.5 in | Wt 149.8 lb

## 2019-06-10 DIAGNOSIS — M1711 Unilateral primary osteoarthritis, right knee: Secondary | ICD-10-CM | POA: Diagnosis not present

## 2019-06-10 DIAGNOSIS — M25561 Pain in right knee: Secondary | ICD-10-CM

## 2019-06-10 MED ORDER — METHYLPREDNISOLONE ACETATE 40 MG/ML IJ SUSP
80.0000 mg | Freq: Once | INTRAMUSCULAR | Status: AC
  Administered 2019-06-10: 80 mg via INTRA_ARTICULAR

## 2019-06-10 NOTE — Progress Notes (Signed)
Sara Schupp T. Deoni Cosey, MD Primary Care and Sports Medicine [facility] at [facility] [place], [address] Phone: [phone]  FAX: [phone]  Sara Stafford - 74 y.o. female  MRN [id number]  Date of Birth: Oct 31, 1945  Visit Date: 06/10/2019  PCP: Vidal Schwalbe, MD  Referred by: Vidal Schwalbe, MD  Chief Complaint  Patient presents with  . Follow-up    Right    This visit occurred during the SARS-CoV-2 public health emergency.  Safety protocols were in place, including screening questions prior to the visit, additional usage of staff PPE, and extensive cleaning of exam room while observing appropriate contact time as indicated for disinfecting solutions.   Subjective:   Sara Stafford is a 74 y.o. very pleasant female patient with Body mass index is 25.31 kg/m. who presents with the following:  F/u R knee OA:  Seen 11/29/2018. And other times over the years. On last visit I did a injection of Depo-Medrol for the R knee with good effect.  She presents today with some more recent knee pain.  She does have pain primarily in the medial compartment.  She does have some difficulty sometimes walking around and doing her shopping.  She does not use any form of an assistive device.  She is not having any mechanical symptoms at all.  She does get some grinding.  Past Medical History, Surgical History, Social History, Family History, Problem List, Medications, and Allergies have been reviewed and updated if relevant.  Patient Active Problem List   Diagnosis Date Noted  . GAD (generalized anxiety disorder) 05/31/2017  . Vitamin D deficiency 08/03/2016  . Well woman exam 07/29/2016  . Osteoporosis 10/05/2015  . Macular degeneration 06/02/2014  . MAMMOGRAM, ABNORMAL, LEFT 09/10/2009  . Pulmonary nodules 03/20/2009  . ANEMIAS DUE TO DISORDERS GLUTATHIONE METABOLISM 07/31/2008  . COPD GOLD II  10/11/2007  . Diabetes ([place]) 06/04/2007    . HYPERLIPIDEMIA 06/04/2007  . Depression 06/04/2007  . GERD 06/04/2007  . FIBROMYALGIA 06/04/2007    Past Medical History:  Diagnosis Date  . COPD (chronic obstructive pulmonary disease) (HCC)    no change with spiriva, poor tolerance of advair  . Depressive disorder, not elsewhere classified   . Diabetes mellitus   . Fatigue   . Fibromyalgia   . GERD (gastroesophageal reflux disease)   . Hyperlipidemia   . Routine general medical examination at a health care facility     Past Surgical History:  Procedure Laterality Date  . ABDOMINAL HYSTERECTOMY     partial  . CATARACT EXTRACTION Left 02/09/2015  . CATARACT EXTRACTION Right 01/26/2015    Social History   Socioeconomic History  . Marital status: Married    Spouse name: Not on file  . Number of children: 2  . Years of education: Not on file  . Highest education level: Not on file  Occupational History  . Occupation: retired    Fish farm manager: RETIRED    Comment: [employer]  Tobacco Use  . Smoking status: Former Smoker    Packs/day: 1.00    Years: 50.00    Pack years: 50.00    Quit date: 06/06/2010    Years since quitting: 9.0  . Smokeless tobacco: Never Used  Substance and Sexual Activity  . Alcohol use: No    Alcohol/week: 0.0 standard drinks  . Drug use: No  . Sexual activity: Never  Other Topics Concern  . Not on file  Social History Narrative  Children aged 41, and 48, expecting 2nd grandchild   Social Determinants of Health   Financial Resource Strain:   . Difficulty of Paying Living Expenses: Not on file  Food Insecurity:   . Worried About Charity fundraiser in the Last Year: Not on file  . Ran Out of Food in the Last Year: Not on file  Transportation Needs:   . Lack of Transportation (Medical): Not on file  . Lack of Transportation (Non-Medical): Not on file  Physical Activity:   . Days of Exercise per Week: Not on file  . Minutes of Exercise per Session: Not on file  Stress:   . Feeling of  Stress : Not on file  Social Connections:   . Frequency of Communication with Friends and Family: Not on file  . Frequency of Social Gatherings with Friends and Family: Not on file  . Attends Religious Services: Not on file  . Active Member of Clubs or Organizations: Not on file  . Attends Archivist Meetings: Not on file  . Marital Status: Not on file  Intimate Partner Violence:   . Fear of Current or Ex-Partner: Not on file  . Emotionally Abused: Not on file  . Physically Abused: Not on file  . Sexually Abused: Not on file    Family History  Problem Relation Age of Onset  . Stroke Father 16  . Diabetes Father   . Asthma Father   . Liver cancer Sister 72  . Pancreatic cancer Sister 54  . Brain cancer Brother   . Diabetes Sister   . Diabetes Sister   . Diabetes Sister   . Diabetes Sister   . Diabetes Sister   . Diabetes Sister   . Diabetes Sister   . Diabetes Paternal Grandmother   . Breast cancer Maternal Aunt 70    Allergies  Allergen Reactions  . Doxycycline     REACTION: thrush    Medication list reviewed and updated in full in [facility].  GEN: No fevers, chills. Nontoxic. Primarily MSK c/o today. MSK: Detailed in the HPI GI: tolerating PO intake without difficulty Neuro: No numbness, parasthesias, or tingling associated. Otherwise the pertinent positives of the ROS are noted above.   Objective:   BP 130/60   Pulse (!) 102   Temp (!) 97.5 F (36.4 C) (Temporal)   Ht 5' 4.5" (1.638 m)   Wt 149 lb 12 oz (67.9 kg)   SpO2 98%   BMI 25.31 kg/m    GEN: WDWN, NAD, Non-toxic, Alert & Oriented x 3 HEENT: Atraumatic, Normocephalic.  Ears and Nose: No external deformity. EXTR: No clubbing/cyanosis/edema NEURO: Normal gait.  PSYCH: Normally interactive. Conversant. Not depressed or anxious appearing.  Calm demeanor.    Right knee: Full extension and flexion to 125 degrees.  There is no effusion.  She does have significant medial joint line  tenderness there is greater than the lateral joint line.  Stable to varus and valgus stress.  Intact ACL and PCL.  Flexion pinch and McMurray's do cause pain without significant mechanical symptoms.  Bounce home is also negative.  Radiology: XR WB Knee 4 Views W/Patella Right  Result Date: 06/10/2019 CLINICAL DATA:  Right knee pain EXAM: RIGHT KNEE - COMPLETE 4+ VIEW COMPARISON:  None. FINDINGS: There is no acute displaced fracture or dislocation. Mild tricompartmental joint space narrowing is noted. There is no significant joint effusion. IMPRESSION: Mild tricompartmental degenerative changes. No acute bony abnormality or significant joint effusion. Electronically Signed  By: Constance Holster M.D.   On: 06/10/2019 15:55     Assessment and Plan:     ICD-10-CM   1. Primary osteoarthritis of right knee  M17.11   2. Recurrent pain of right knee  M25.561 XR WB Knee 4 Views W/Patella Right    methylPREDNISolone acetate (DEPO-MEDROL) injection 80 mg   Level of Medical Decision-Making in this case is Moderate.  She does have some minor to mild knee arthritis on the independent review of the films.  There is no fracture or dislocation. The radiological images were independently reviewed by myself in the office and results were reviewed with the patient. My independent interpretation. Electronically Signed  By: Owens Loffler, MD On: 06/10/2019 12:00 PM EST   I reviewed with her some over-the-counter remedies that she can try and I am going to do an intra-articular injection today.  If she has decreased efficacy of this, then some viscosupplementation would certainly be a good idea.  Degenerative meniscal tear could certainly be a part of the pathology.  Patient Instructions  OSTEOARTHRITIS:  For symptomatic relief:  Tylenol: 2 tablets up to 3-4 times a day Regular NSAIDS are helpful (avoid in kidney disease and ulcers)  Topical Capzaicin Cream, as needed (wear glove to put on) - THIS IS  EXCEPTIONALLY HOT Supplements: Tart cherry juice and Curcumin (Turmeric extract) have good scientific evidence  For flares, corticosteroid injections help. Hyaluronic Acid injections have good success, average relief is 6 months  Glucosamine and Chondroitin often helpful - will take about 3 months to see if you have an effect. If you do, great, keep them up, if none at that point, no need to take in the future.  Omega-3 fish oils may help, 2 grams daily  Ice joints on bad days, 20 min, 2-3 x / day REGULAR EXERCISE: swimming, Yoga, Tai Chi, bicycle (NON-IMPACT activity)   Weight loss will always take stress off of the joints and back     Aspiration/Injection Procedure Note ADRIYANNA CHRISTIANS 1945/08/29 Date of procedure: 06/10/2019  Procedure: Large Joint Aspiration / Injection of Knee, R Indications: Pain  Procedure Details Patient verbally consented to procedure. Risks (including potential rare risk of infection), benefits, and alternatives explained. Sterilely prepped with Chloraprep. Ethyl cholride used for anesthesia. 8 cc Lidocaine 1% mixed with 2 mL Depo-Medrol 40 mg injected using the anteromedial approach without difficulty. No complications with procedure and tolerated well. Patient had decreased pain post-injection. Medication: 2 mL of Depo-Medrol 40 mg, equaling Depo-Medrol 80 mg total  Follow-up: No follow-ups on file.  Meds ordered this encounter  Medications  . methylPREDNISolone acetate (DEPO-MEDROL) injection 80 mg   Orders Placed This Encounter  Procedures  . XR WB Knee 4 Views W/Patella Right    Signed,  Hadlie Gipson T. Khaleem Burchill, MD   Outpatient Encounter Medications as of 06/10/2019  Medication Sig  . aspirin 81 MG tablet Take 81 mg by mouth as needed.    . Blood Glucose Monitoring Suppl (TRUE METRIX METER) w/Device KIT   . cetirizine (ZYRTEC) 10 MG tablet Take 10 mg by mouth daily.  . clonazePAM (KLONOPIN) 0.5 MG tablet Take 0.5 mg by mouth 2 (two) times  daily as needed.  . diclofenac sodium (VOLTAREN) 1 % GEL   . furosemide (LASIX) 20 MG tablet Take 20 mg by mouth daily.  Marland Kitchen glipiZIDE (GLUCOTROL XL) 10 MG 24 hr tablet TAKE (2) TABLETS BY MOUTH ONCE DAILY.  Marland Kitchen lisinopril (ZESTRIL) 10 MG tablet Take 10 mg by mouth  daily.  . metFORMIN (GLUCOPHAGE) 1000 MG tablet TAKE (1) TABLET TWICE A DAY WITH FOOD---BREAKFAST AND SUPPER.  . montelukast (SINGULAIR) 10 MG tablet TAKE ONE TABLET BY MOUTH AT BEDTIME.  Marland Kitchen omeprazole (PRILOSEC) 20 MG capsule TAKE (1) CAPSULE BY MOUTH ONCE DAILY AS NEEDED.  . TRUEplus Lancets 30G MISC   . venlafaxine (EFFEXOR) 37.5 MG tablet TAKE 1 TABLET BY MOUTH TWICE DAILY  . VENTOLIN HFA 108 (90 Base) MCG/ACT inhaler INHALE 2 PUFFS BY MOUTH EVERY 4 HOURS AS NEEDED  . WIXELA INHUB 250-50 MCG/DOSE AEPB TAKE 1 PUFF BY MOUTH TWICE A DAY  . [DISCONTINUED] amitriptyline (ELAVIL) 25 MG tablet   . [DISCONTINUED] hydrochlorothiazide (MICROZIDE) 12.5 MG capsule   . [DISCONTINUED] omeprazole (PRILOSEC OTC) 20 MG tablet Take 1 tablet (20 mg total) by mouth daily as needed.  . [DISCONTINUED] pioglitazone (ACTOS) 30 MG tablet   . [DISCONTINUED] rosuvastatin (CRESTOR) 20 MG tablet   . [EXPIRED] methylPREDNISolone acetate (DEPO-MEDROL) injection 80 mg    No facility-administered encounter medications on file as of 06/10/2019.

## 2019-06-10 NOTE — Patient Instructions (Signed)
OSTEOARTHRITIS:  For symptomatic relief:  Tylenol: 2 tablets up to 3-4 times a day Regular NSAIDS are helpful (avoid in kidney disease and ulcers)  Topical Capzaicin Cream, as needed (wear glove to put on) - THIS IS EXCEPTIONALLY HOT Supplements: Tart cherry juice and Curcumin (Turmeric extract) have good scientific evidence  For flares, corticosteroid injections help. Hyaluronic Acid injections have good success, average relief is 6 months  Glucosamine and Chondroitin often helpful - will take about 3 months to see if you have an effect. If you do, great, keep them up, if none at that point, no need to take in the future.  Omega-3 fish oils may help, 2 grams daily  Ice joints on bad days, 20 min, 2-3 x / day REGULAR EXERCISE: swimming, Yoga, Tai Chi, bicycle (NON-IMPACT activity)   Weight loss will always take stress off of the joints and back  

## 2020-02-20 ENCOUNTER — Encounter: Payer: Self-pay | Admitting: Family Medicine

## 2020-02-20 ENCOUNTER — Ambulatory Visit (INDEPENDENT_AMBULATORY_CARE_PROVIDER_SITE_OTHER): Payer: Medicare Other | Admitting: Family Medicine

## 2020-02-20 ENCOUNTER — Other Ambulatory Visit: Payer: Self-pay

## 2020-02-20 VITALS — BP 116/62 | HR 86 | Temp 97.9°F | Ht 64.5 in | Wt 132.0 lb

## 2020-02-20 DIAGNOSIS — M545 Low back pain: Secondary | ICD-10-CM | POA: Diagnosis not present

## 2020-02-20 DIAGNOSIS — E119 Type 2 diabetes mellitus without complications: Secondary | ICD-10-CM

## 2020-02-20 DIAGNOSIS — M79604 Pain in right leg: Secondary | ICD-10-CM

## 2020-02-20 DIAGNOSIS — Z23 Encounter for immunization: Secondary | ICD-10-CM | POA: Diagnosis not present

## 2020-02-20 MED ORDER — PREDNISONE 20 MG PO TABS: ORAL_TABLET | ORAL | 0 refills | Status: DC

## 2020-02-20 MED ORDER — PREDNISONE 10 MG PO TABS: 10.0000 mg | ORAL_TABLET | Freq: Every day | ORAL | 0 refills | Status: DC

## 2020-02-20 NOTE — Progress Notes (Signed)
Sara Stafford T. Sara Fosdick, MD, [place]  Primary Care and [place] at [facility] [place], [address]  Phone: [phone]  FAX: [phone]  Sara Stafford - 74 y.o. female  MRN [id number]  Date of Birth: 05-Aug-1945  Date: 02/20/2020  PCP: Vidal Schwalbe, MD  Referral: Vidal Schwalbe, MD  Chief Complaint  Patient presents with  . Back Pain    Pt says she has sciatica. Pain states in her back and goes down her legs.    This visit occurred during the SARS-CoV-2 public health emergency.  Safety protocols were in place, including screening questions prior to the visit, additional usage of staff PPE, and extensive cleaning of exam room while observing appropriate contact time as indicated for disinfecting solutions.   Subjective:   Sara Stafford is a 74 y.o. very pleasant female patient with Body mass index is 22.31 kg/m. who presents with the following:  She is a well-known patient she presents today with some ongoing back pain and some sciatica occasionally going down both legs.  She has had intermittent back pain for some time, but in late May she did have an exacerbation when she was moving some furniture.  Her pain does feel better with Aleve, and her primary care doctor in [place] did place her on some oral steroids.  Previously she was really unable to tolerate them and had some significant nauseousness.  She denies numbness or tingling.  She denies saddle anesthesia or incontinence.  Milus Glazier in [place], placed on steroids.   Started in the late May.  Moved her furniture.   Feels better with alleve.  Down both legs.   NO weakness or numbness  Lab Results  Component Value Date   HGBA1C 7.2 (H) 04/07/2017     Review of Systems is noted in the HPI, as appropriate   Objective:   BP 116/62 (BP Location: Left Arm, Patient Position: Sitting, Cuff Size: Normal)   Pulse 86   Temp 97.9 F  (36.6 C)   Ht 5' 4.5" (1.638 m)   Wt 132 lb (59.9 kg)   SpO2 98%   BMI 22.31 kg/m    GEN: No acute distress; alert,appropriate. PULM: Breathing comfortably in no respiratory distress PSYCH: Normally interactive.   Range of motion at  the waist: Flexion, extension, lateral bending and rotation: She does have a notably limited extension with flexion to approximately 85 degrees.  Lateral bending and rotation are preserved.  No echymosis or edema Rises to examination table with mild difficulty Gait: minimally antalgic  Inspection/Deformity: N Paraspinus Tenderness: L5-S1, worse at the sciatic notch and upper pelvic rim.  B Ankle Dorsiflexion (L5,4): 5/5 B Great Toe Dorsiflexion (L5,4): 5/5 Heel Walk (L5): WNL Toe Walk (S1): WNL Rise/Squat (L4): WNL, mild pain  SENSORY B Medial Foot (L4): WNL B Dorsum (L5): WNL B Lateral (S1): WNL Light Touch: WNL Pinprick: WNL  REFLEXES Knee (L4): 2+ Ankle (S1): 2+  B SLR, seated: neg B SLR, supine: neg B FABER: neg B Reverse FABER: neg B Greater Troch: NT B Log Roll: neg B Sciatic Notch: Bilateral, tender to palpation  Radiology: No results found.  Assessment and Plan:     ICD-10-CM   1. Lumbar pain with radiation down both legs  M54.5   2. Need for influenza vaccination  Z23 Flu Vaccine QUAD High Dose(Fluad)  3. Controlled type 2 diabetes mellitus without complication, without long-term current use of insulin (HCC)  E11.9  4 months of radicular pain in a setting of degenerative changes in the back with prior back pain as well.  I think that she is already doing things well for the management of her back albeit she has some significant ongoing symptoms.  I was going to have the patient take a higher dose of some steroids, but with diabetes and her concern about nausea I did ultimately decrease this to 10 mg daily.  Realistically, she is not can be pain-free and will likely have intermittent flareups of back pain off and on  indefinitely.  Follow-up: Return in about 6 weeks (around 04/02/2020).  Meds ordered this encounter  Medications  . DISCONTD: predniSONE (DELTASONE) 20 MG tablet    Sig: 2 tabs po for 7 days, then 1 tab po for 7 days    Dispense:  21 tablet    Refill:  0  . predniSONE (DELTASONE) 10 MG tablet    Sig: Take 1 tablet (10 mg total) by mouth daily with breakfast.    Dispense:  14 tablet    Refill:  0    Cancel the prior high dose prednisone dosing   Medications Discontinued During This Encounter  Medication Reason  . predniSONE (DELTASONE) 20 MG tablet    Orders Placed This Encounter  Procedures  . Flu Vaccine QUAD High Dose(Fluad)    Signed,  Sharell Hilmer T. Anjali Manzella, MD   Outpatient Encounter Medications as of 02/20/2020  Medication Sig  . aspirin 81 MG tablet Take 81 mg by mouth as needed.    . Blood Glucose Monitoring Suppl (TRUE METRIX METER) w/Device KIT   . clonazePAM (KLONOPIN) 0.5 MG tablet Take 0.5 mg by mouth 2 (two) times daily as needed.  . diclofenac sodium (VOLTAREN) 1 % GEL   . furosemide (LASIX) 20 MG tablet Take 20 mg by mouth daily.  Marland Kitchen glipiZIDE (GLUCOTROL XL) 10 MG 24 hr tablet TAKE (2) TABLETS BY MOUTH ONCE DAILY.  Marland Kitchen lisinopril (ZESTRIL) 10 MG tablet Take 10 mg by mouth daily.  . metFORMIN (GLUCOPHAGE) 1000 MG tablet TAKE (1) TABLET TWICE A DAY WITH FOOD---BREAKFAST AND SUPPER.  Marland Kitchen omeprazole (PRILOSEC) 20 MG capsule TAKE (1) CAPSULE BY MOUTH ONCE DAILY AS NEEDED.  . TRUEplus Lancets 30G MISC   . venlafaxine (EFFEXOR) 37.5 MG tablet TAKE 1 TABLET BY MOUTH TWICE DAILY  . VENTOLIN HFA 108 (90 Base) MCG/ACT inhaler INHALE 2 PUFFS BY MOUTH EVERY 4 HOURS AS NEEDED  . WIXELA INHUB 250-50 MCG/DOSE AEPB TAKE 1 PUFF BY MOUTH TWICE A DAY  . cetirizine (ZYRTEC) 10 MG tablet Take 10 mg by mouth daily. (Patient not taking: Reported on 02/20/2020)  . montelukast (SINGULAIR) 10 MG tablet TAKE ONE TABLET BY MOUTH AT BEDTIME. (Patient not taking: Reported on 02/20/2020)  .  predniSONE (DELTASONE) 10 MG tablet Take 1 tablet (10 mg total) by mouth daily with breakfast.  . [DISCONTINUED] omeprazole (PRILOSEC OTC) 20 MG tablet Take 1 tablet (20 mg total) by mouth daily as needed.  . [DISCONTINUED] predniSONE (DELTASONE) 20 MG tablet 2 tabs po for 7 days, then 1 tab po for 7 days   No facility-administered encounter medications on file as of 02/20/2020.

## 2020-02-22 ENCOUNTER — Encounter: Payer: Self-pay | Admitting: Family Medicine

## 2020-04-13 ENCOUNTER — Encounter: Payer: Self-pay | Admitting: Family Medicine

## 2020-04-13 ENCOUNTER — Ambulatory Visit (INDEPENDENT_AMBULATORY_CARE_PROVIDER_SITE_OTHER): Payer: Medicare Other | Admitting: Family Medicine

## 2020-04-13 ENCOUNTER — Other Ambulatory Visit: Payer: Self-pay

## 2020-04-13 VITALS — BP 120/60 | HR 110 | Temp 98.1°F | Ht 64.5 in | Wt 134.5 lb

## 2020-04-13 DIAGNOSIS — M1711 Unilateral primary osteoarthritis, right knee: Secondary | ICD-10-CM | POA: Diagnosis not present

## 2020-04-13 MED ORDER — TRIAMCINOLONE ACETONIDE 40 MG/ML IJ SUSP
40.0000 mg | Freq: Once | INTRAMUSCULAR | Status: AC
  Administered 2020-04-13: 40 mg via INTRA_ARTICULAR

## 2020-04-13 NOTE — Progress Notes (Signed)
Makinley Muscato T. Elesia Pemberton, MD, [place]  [facility] and [place] at [facility] [place], [address]  Phone: [phone]  FAX: [phone]  Sara Stafford - 74 y.o. female  MRN [id number]  Date of Birth: 07-11-1945  Date: 04/13/2020  PCP: Vidal Schwalbe, MD  Referral: Vidal Schwalbe, MD  Chief Complaint  Patient presents with  . Knee Pain    Right    This visit occurred during the SARS-CoV-2 public health emergency.  Safety protocols were in place, including screening questions prior to the visit, additional usage of staff PPE, and extensive cleaning of exam room while observing appropriate contact time as indicated for disinfecting solutions.   Subjective:   Sara Stafford is a 74 y.o. very pleasant female patient with Body mass index is 22.73 kg/m. who presents with the following:  R knee pain acute pain:    She has been having some acute on chronic knee pain on the right with a known history of osteoarthritis.  I remember her quite well and have seen her a number of times over the years.  She is currently using some meloxicam once daily as well as Voltaren gel.  She also uses some Salonpas, and she does think that this helps with her symptoms.  Last time she was here I did review some complementary medicine, but at this point she has not tried tart cherry juice concentrate, curcumin, or capsaicin.  Has tried some ice.  She is using voltaren gel and mobic.  She is doing some PT twice a week.  I saw her last with some osteoarthritis flareup in January, 2021, and at that point I did do a Depo-Medrol injection.  She is also having some global myalgias, and she wonders if this could be from the statin.  Review of Systems is noted in the HPI, as appropriate   Objective:   BP 120/60   Pulse (!) 110   Temp 98.1 F (36.7 C) (Temporal)   Ht 5' 4.5" (1.638 m)   Wt 134 lb 8 oz (61 kg)   SpO2  97%   BMI 22.73 kg/m    Knee: Right Gait: Normal heel toe pattern ROM: 0-118 Effusion: Minimal Echymosis or edema: none Patellar tendon NT Painful PLICA: neg Patellar grind: Mild with some pain  medial and lateral patellar facet loading: Mild with loading medial lateral patellar facet medial and lateral joint lines: Medial joint line tenderness  Mcmurray's positive for pain Flexion-pinch positive Varus and valgus stress: stable Lachman: neg Ant and Post drawer: neg Hip abduction, IR, ER: WNL Hip flexion str: 5/5 Hip abd: 5/5 Quad: 5/5 VMO atrophy:No Hamstring concentric and eccentric: 5/5   Radiology: CLINICAL DATA:  Right knee pain  EXAM: RIGHT KNEE - COMPLETE 4+ VIEW  COMPARISON:  None.  FINDINGS: There is no acute displaced fracture or dislocation. Mild tricompartmental joint space narrowing is noted. There is no significant joint effusion.  IMPRESSION: Mild tricompartmental degenerative changes. No acute bony abnormality or significant joint effusion.   Electronically Signed   By: Constance Holster M.D.   On: 06/10/2019 15:55  I also independently reviewed her plain films again from January, 2021, and he does have some tricompartmental degenerative joint disease.  This would be mild in character. Electronically Signed  By: Owens Loffler, MD On: 04/13/2020  3:40 PM EST   Assessment and Plan:     ICD-10-CM   1. Primary osteoarthritis of right knee  M17.11 triamcinolone acetonide (KENALOG-40) injection 40 mg   She is doing a good job with maintaining her knee strength and function along with doing some traditional measures for arthritic management.  She does have a flareup today.  She also tells me about generalized myalgias and statins.  She does have diabetes which would be highly recommended to take a statin.  It is probably reasonable for her to stop this for a week or so and see if her symptoms resolve.  If it makes no difference, then this  would definitely not be from her statin  I am going to inject her knee today.  She has not had a good response to corticosteroid injections in the past.  Aspiration/Injection Procedure Note Sara Stafford 02-Mar-1946 Date of procedure: 04/13/2020  Procedure: Large Joint Aspiration / Injection of Knee, R Indications: Pain  Procedure Details Patient verbally consented to procedure. Risks (including potential rare risk of infection), benefits, and alternatives explained. Sterilely prepped with Chloraprep. Ethyl cholride used for anesthesia. 9 cc Lidocaine 1% mixed with 1 mL of Kenalog 40 mg injected using the anteromedial approach without difficulty. No complications with procedure and tolerated well. Patient had decreased pain post-injection. Medication: 1 mL of Kenalog 40 mg  Patient Instructions  Supplements: Tart cherry juice and Curcumin (Turmeric extract) have good scientific evidence  Keep taking your Meloxicam once a day  Keep using your Voltaren gel - up to 4 times a day  Salonpas is totally fine, too   Your cholesterol medicine cannot be causing the knee pain, but they can sometimes cause some global muscle pain.    - If you stop it, then do it for about 7-10 days.  If it does not help, then it is not your cholesterol medicine.    Meds ordered this encounter  Medications  . triamcinolone acetonide (KENALOG-40) injection 40 mg   Follow-up: No follow-ups on file.  Signed,  Maud Deed. Dorinda Stehr, MD   Outpatient Encounter Medications as of 04/13/2020  Medication Sig  . aspirin 81 MG tablet Take 81 mg by mouth as needed.    . Blood Glucose Monitoring Suppl (TRUE METRIX METER) w/Device KIT   . cetirizine (ZYRTEC) 10 MG tablet Take 10 mg by mouth daily.   . clonazePAM (KLONOPIN) 0.5 MG tablet Take 0.5 mg by mouth 2 (two) times daily as needed.  . diclofenac sodium (VOLTAREN) 1 % GEL   . furosemide (LASIX) 20 MG tablet Take 20 mg by mouth daily.  Marland Kitchen glipiZIDE  (GLUCOTROL XL) 10 MG 24 hr tablet TAKE (2) TABLETS BY MOUTH ONCE DAILY.  Marland Kitchen JANUVIA 50 MG tablet Take 50 mg by mouth daily.  Marland Kitchen lisinopril (ZESTRIL) 10 MG tablet Take 10 mg by mouth daily.  . meloxicam (MOBIC) 15 MG tablet Take 15 mg by mouth daily.  . metFORMIN (GLUCOPHAGE) 1000 MG tablet TAKE (1) TABLET TWICE A DAY WITH FOOD---BREAKFAST AND SUPPER.  . montelukast (SINGULAIR) 10 MG tablet TAKE ONE TABLET BY MOUTH AT BEDTIME.  Marland Kitchen omeprazole (PRILOSEC) 20 MG capsule TAKE (1) CAPSULE BY MOUTH ONCE DAILY AS NEEDED.  Marland Kitchen rosuvastatin (CRESTOR) 20 MG tablet Take 20 mg by mouth at bedtime.  . traMADol (ULTRAM) 50 MG tablet Take by mouth every 6 (six) hours as needed.  . TRUEplus Lancets 30G MISC   . venlafaxine (EFFEXOR) 37.5 MG tablet TAKE 1 TABLET BY MOUTH TWICE DAILY  . VENTOLIN HFA 108 (90 Base) MCG/ACT inhaler INHALE 2 PUFFS BY MOUTH EVERY 4 HOURS AS NEEDED  .  WIXELA INHUB 250-50 MCG/DOSE AEPB TAKE 1 PUFF BY MOUTH TWICE A DAY  . [DISCONTINUED] omeprazole (PRILOSEC OTC) 20 MG tablet Take 1 tablet (20 mg total) by mouth daily as needed.  . [DISCONTINUED] predniSONE (DELTASONE) 10 MG tablet Take 1 tablet (10 mg total) by mouth daily with breakfast.  . [EXPIRED] triamcinolone acetonide (KENALOG-40) injection 40 mg    No facility-administered encounter medications on file as of 04/13/2020.

## 2020-04-13 NOTE — Patient Instructions (Addendum)
Supplements: Tart cherry juice and Curcumin (Turmeric extract) have good scientific evidence  Keep taking your Meloxicam once a day  Keep using your Voltaren gel - up to 4 times a day  Salonpas is totally fine, too   Your cholesterol medicine cannot be causing the knee pain, but they can sometimes cause some global muscle pain.    - If you stop it, then do it for about 7-10 days.  If it does not help, then it is not your cholesterol medicine.

## 2020-05-06 ENCOUNTER — Encounter: Payer: Self-pay | Admitting: Family Medicine

## 2020-05-06 ENCOUNTER — Other Ambulatory Visit: Payer: Self-pay

## 2020-05-06 ENCOUNTER — Ambulatory Visit (INDEPENDENT_AMBULATORY_CARE_PROVIDER_SITE_OTHER): Payer: Medicare Other | Admitting: Family Medicine

## 2020-05-06 ENCOUNTER — Ambulatory Visit (INDEPENDENT_AMBULATORY_CARE_PROVIDER_SITE_OTHER)
Admission: RE | Admit: 2020-05-06 | Discharge: 2020-05-06 | Disposition: A | Discharge status: Home / Self Care | Payer: Medicare Other | Source: Ambulatory Visit | Attending: Family Medicine | Admitting: Family Medicine

## 2020-05-06 VITALS — BP 140/62 | HR 73 | Temp 97.7°F | Ht 64.5 in | Wt 133.5 lb

## 2020-05-06 DIAGNOSIS — M7062 Trochanteric bursitis, left hip: Secondary | ICD-10-CM

## 2020-05-06 DIAGNOSIS — M5416 Radiculopathy, lumbar region: Secondary | ICD-10-CM

## 2020-05-06 DIAGNOSIS — G5702 Lesion of sciatic nerve, left lower limb: Secondary | ICD-10-CM | POA: Diagnosis not present

## 2020-05-06 MED ORDER — TRIAMCINOLONE ACETONIDE 40 MG/ML IJ SUSP
40.0000 mg | Freq: Once | INTRAMUSCULAR | Status: AC
  Administered 2020-05-06: 40 mg via INTRA_ARTICULAR

## 2020-05-06 NOTE — Progress Notes (Signed)
Sara Stafford T. Garima Chronis, MD, [place]  Primary Care and [place] at [facility] [place], [address]  Phone: [phone]  FAX: [phone]  Sara Stafford - 74 y.o. female  MRN [id number]  Date of Birth: [birth year]  Date: 05/06/2020  PCP: Vidal Schwalbe, MD  Referral: Vidal Schwalbe, MD  Chief Complaint  Patient presents with  . Sciatica    Dr. Bartolo Darter recommneded that she come see you and have you inject her sciatic nerve    This visit occurred during the SARS-CoV-2 public health emergency.  Safety protocols were in place, including screening questions prior to the visit, additional usage of staff PPE, and extensive cleaning of exam room while observing appropriate contact time as indicated for disinfecting solutions.   Subjective:   Sara Stafford is a 74 y.o. very pleasant female patient with Body mass index is 22.56 kg/m. who presents with the following:  74 year old female some ongoing buttocks and back pain with sciatica.  PCP wants to watch her kidney function.  ? Renal panel changes.    She does have some back and buttocks pain, greater on the left compared to the right.  She does have some pain that radiates down the posterior aspect of her leg.  This is much of the time right now.  She also has some pain on the lateral aspect of the hip on the left.  This is exacerbation of prior pain and injury.  She has had some back pain off and on for quite some time.  2/3 piriformis inj on L 1/3 on the GTB, L  Review of Systems is noted in the HPI, as appropriate   Objective:   BP 140/62   Pulse 73   Temp 97.7 F (36.5 C) (Temporal)   Ht 5' 4.5" (1.638 m)   Wt 133 lb 8 oz (60.6 kg)   SpO2 100%   BMI 22.56 kg/m   Inspection/Deformity: N Paraspinus Tenderness: Mild tenderness L4-S1 bilaterally  B Ankle Dorsiflexion (L5,4): 5/5 B Great Toe Dorsiflexion (L5,4): 5/5 Heel Walk (L5): WNL Toe  Walk (S1): WNL Rise/Squat (L4): WNL  SENSORY B Medial Foot (L4): WNL B Dorsum (L5): WNL B Lateral (S1): WNL Light Touch: WNL Pinprick: WNL  REFLEXES Knee (L4): 2+ Ankle (S1): 2+  B SLR, seated: neg B SLR, supine: neg B FABER: Mild tenderness B Log Roll: neg B Sciatic Notch: NT  HIP EXAM: SIDE: Left ROM: Abduction, Flexion, Internal and External range of motion: Full Pain with terminal IROM and EROM: None GTB: Tender to palpation left SLR: NEG Knees: No effusion REVERSE FABER: Positive and tender to palpation at directly at the piriformis. Str: flexion: 4 /5 abduction: 4/5 adduction: 5/5 Strength testing non-tender     Radiology: No results found.  Assessment and Plan:     ICD-10-CM   1. Piriformis syndrome, left  G57.02 DG Lumbar Spine Complete    triamcinolone acetonide (KENALOG-40) injection 40 mg  2. Trochanteric bursitis of left hip  M70.62   3. Lumbar radiculopathy  M54.16 DG Lumbar Spine Complete   She is having some left-sided radicular pain.  By history she is having this some on the right.  Exact etiology is not clear, more likely from the spine, but certainly she does have pain in the piriformis region.  I am going to try to inject her piriformis today to see if this will help and have her continue to do PT.  I do think some focal hip strengthening would help quite a bit.  She also has some trochanteric bursitis on the left, and this is only mild to moderate in character.  Strengthening again will help with this.  Aspiration/Injection Procedure Note [name] March 29, 1946 Date of procedure: 05/06/2020  Procedure: Large Joint Aspiration / Injection of Hip, L Indications: Pain  Procedure Details Verbal consent obtained. Risks, benefits, and alternatives reviewed. Greater trochanter and piriformis region sterilely prepped with Chloraprep. Ethyl Chloride used for anesthesia. 5 cc of Lidocaine 1% injected with 1/2 mL of Kenalog 40 mg into the  piriformis and 3 cc of lidocaine 1% and 1/2 cc of Kenalog 40 mg trochanteric bursa at area of maximal tenderness at greater trochanter. Needle taken to bone to troch bursa, flows easily. Bursa massaged. No bleeding and no complications. Decreased pain after injection. Needle: 22 g, 1 1/2 inch Medication: 1 mL of Kenalog 40 mg   Meds ordered this encounter  Medications  . triamcinolone acetonide (KENALOG-40) injection 40 mg   There are no discontinued medications. Orders Placed This Encounter  Procedures  . DG Lumbar Spine Complete    Follow-up: No follow-ups on file.  Signed,  Maud Deed. Andris Brothers, MD   Outpatient Encounter Medications as of 05/06/2020  Medication Sig  . aspirin 81 MG tablet Take 81 mg by mouth as needed.    . Blood Glucose Monitoring Suppl (TRUE METRIX METER) w/Device KIT   . cetirizine (ZYRTEC) 10 MG tablet Take 10 mg by mouth daily.   . clonazePAM (KLONOPIN) 0.5 MG tablet Take 0.5 mg by mouth 2 (two) times daily as needed.  . diclofenac sodium (VOLTAREN) 1 % GEL   . furosemide (LASIX) 20 MG tablet Take 20 mg by mouth daily.  Marland Kitchen glipiZIDE (GLUCOTROL XL) 10 MG 24 hr tablet TAKE (2) TABLETS BY MOUTH ONCE DAILY.  Marland Kitchen JANUVIA 50 MG tablet Take 50 mg by mouth daily.  Marland Kitchen lisinopril (ZESTRIL) 10 MG tablet Take 10 mg by mouth daily.  . meloxicam (MOBIC) 15 MG tablet Take 15 mg by mouth daily.  . metFORMIN (GLUCOPHAGE) 1000 MG tablet TAKE (1) TABLET TWICE A DAY WITH FOOD---BREAKFAST AND SUPPER.  . metoprolol tartrate (LOPRESSOR) 25 MG tablet Take 25 mg by mouth 2 (two) times daily.  . montelukast (SINGULAIR) 10 MG tablet TAKE ONE TABLET BY MOUTH AT BEDTIME.  Marland Kitchen omeprazole (PRILOSEC) 20 MG capsule TAKE (1) CAPSULE BY MOUTH ONCE DAILY AS NEEDED.  Marland Kitchen rosuvastatin (CRESTOR) 20 MG tablet Take 20 mg by mouth at bedtime.  . traMADol (ULTRAM) 50 MG tablet Take by mouth every 6 (six) hours as needed.  . TRUEplus Lancets 30G MISC   . venlafaxine (EFFEXOR) 37.5 MG tablet TAKE 1 TABLET  BY MOUTH TWICE DAILY  . VENTOLIN HFA 108 (90 Base) MCG/ACT inhaler INHALE 2 PUFFS BY MOUTH EVERY 4 HOURS AS NEEDED  . WIXELA INHUB 250-50 MCG/DOSE AEPB TAKE 1 PUFF BY MOUTH TWICE A DAY  . [DISCONTINUED] omeprazole (PRILOSEC OTC) 20 MG tablet Take 1 tablet (20 mg total) by mouth daily as needed.  . [EXPIRED] triamcinolone acetonide (KENALOG-40) injection 40 mg    No facility-administered encounter medications on file as of 05/06/2020.

## 2020-05-06 NOTE — Patient Instructions (Signed)
Hip Rehab:  Hip Flexion: Toe up to ceiling, laying on your back. Lift your whole leg, 3 sets. Work up to being able to do #30 with each set.  Hip Abductions: Lying on side, straight out to side. 3 sets, work up to being able to do #30 with each set.  At the beginning you may only be able to do a lot less, try to do #10.  

## 2020-06-02 DIAGNOSIS — I10 Essential (primary) hypertension: Secondary | ICD-10-CM | POA: Diagnosis present

## 2020-06-05 ENCOUNTER — Telehealth: Payer: Self-pay | Admitting: Emergency Medicine

## 2020-06-05 NOTE — Telephone Encounter (Signed)
Pt called in wanted to know about becoming Dr. Michele Mcalpine pt

## 2020-06-08 NOTE — Telephone Encounter (Signed)
I would like to help this patient out but given her current staffing limitations I am unable to take on new patients right now.  I apologize.

## 2020-06-11 ENCOUNTER — Other Ambulatory Visit: Payer: Self-pay | Admitting: Student

## 2020-06-11 DIAGNOSIS — E1122 Type 2 diabetes mellitus with diabetic chronic kidney disease: Secondary | ICD-10-CM

## 2020-06-11 DIAGNOSIS — N183 Chronic kidney disease, stage 3 unspecified: Secondary | ICD-10-CM

## 2020-06-19 ENCOUNTER — Ambulatory Visit (HOSPITAL_COMMUNITY): Payer: Medicare (Managed Care)

## 2020-06-26 ENCOUNTER — Other Ambulatory Visit: Payer: Self-pay

## 2020-06-26 ENCOUNTER — Ambulatory Visit (HOSPITAL_COMMUNITY)
Admission: RE | Admit: 2020-06-26 | Discharge: 2020-06-26 | Disposition: A | Discharge status: Home / Self Care | Payer: Medicare (Managed Care) | Source: Ambulatory Visit | Attending: Student | Admitting: Student

## 2020-06-26 DIAGNOSIS — N183 Chronic kidney disease, stage 3 unspecified: Secondary | ICD-10-CM | POA: Insufficient documentation

## 2020-06-26 DIAGNOSIS — E1122 Type 2 diabetes mellitus with diabetic chronic kidney disease: Secondary | ICD-10-CM

## 2020-09-29 ENCOUNTER — Ambulatory Visit (INDEPENDENT_AMBULATORY_CARE_PROVIDER_SITE_OTHER): Payer: Medicare (Managed Care) | Admitting: Urology

## 2020-09-29 ENCOUNTER — Encounter: Payer: Self-pay | Admitting: Urology

## 2020-09-29 ENCOUNTER — Other Ambulatory Visit: Payer: Self-pay

## 2020-09-29 VITALS — BP 125/78 | HR 81 | Ht 64.5 in | Wt 133.0 lb

## 2020-09-29 DIAGNOSIS — N281 Cyst of kidney, acquired: Secondary | ICD-10-CM

## 2020-09-29 DIAGNOSIS — N2889 Other specified disorders of kidney and ureter: Secondary | ICD-10-CM | POA: Diagnosis not present

## 2020-09-29 LAB — URINALYSIS, COMPLETE
Bilirubin, UA: NEGATIVE
Glucose, UA: NEGATIVE
Ketones, UA: NEGATIVE
Leukocytes,UA: NEGATIVE
Nitrite, UA: NEGATIVE
Protein,UA: NEGATIVE
RBC, UA: NEGATIVE
Specific Gravity, UA: <1.005 — ABNORMAL LOW (ref 1.005–1.030)
Urobilinogen, Ur: 0.2 mg/dL (ref 0.2–1.0)
pH, UA: 5 (ref 5.0–7.5)

## 2020-09-29 LAB — MICROSCOPIC EXAMINATION
Bacteria, UA: NONE SEEN
RBC, Urine: NONE SEEN /hpf (ref 0–2)

## 2020-09-29 NOTE — Progress Notes (Signed)
09/29/2020 1:54 PM   Sara Stafford 1945/08/21 [id number]  Referring provider: Barbette Merino, PA-C [id number] [place],  [place] [address]  Chief Complaint  Patient presents with  . renal cyst    HPI: 75 year old female who presents today for further evaluation of a right renal cyst.  She underwent a renal ultrasound on 06/26/2020 for further evaluation of chronic kidney disease.  She had a 2.7 cm interpolar right lower cyst with internal septations and a probable calcification within the septum.  Notably, per the report, this did increase slightly since CT in June 2019.  She did have a CT abdomen pelvis with contrast on 10/2017 which was personally reviewed today.  The cyst was present on this study however there is no clear septation and was slightly smaller in size.  She denies a personal history of renal cell carcinoma.  She does report that her sister was diagnosed with kidney cancer and advanced age but never had any treatment for this.  She did not die of kidney cancer.  She another sister who progressed to end-stage renal disease on dialysis.  She does have some baseline CKD.  Most recent creatinine 1.07, GFR 50.  No flank pain.  Remote history of kidney stones in 2019 without recurrence.  No urinary issues including no dysuria or gross hematuria.   PMH: Past Medical History:  Diagnosis Date  . COPD (chronic obstructive pulmonary disease) (HCC)    no change with spiriva, poor tolerance of advair  . Depressive disorder, not elsewhere classified   . Diabetes mellitus   . Fatigue   . Fibromyalgia   . GERD (gastroesophageal reflux disease)   . Hyperlipidemia   . Routine general medical examination at a health care facility     Surgical History: Past Surgical History:  Procedure Laterality Date  . ABDOMINAL HYSTERECTOMY     partial  . CATARACT EXTRACTION Left 02/09/2015  . CATARACT EXTRACTION Right 01/26/2015    Home Medications:   Allergies as of 09/29/2020      Reactions   Doxycycline    REACTION: thrush      Medication List       Accurate as of Sep 29, 2020  1:54 PM. If you have any questions, ask your nurse or doctor.        aspirin 81 MG tablet Take 81 mg by mouth as needed.   cetirizine 10 MG tablet Commonly known as: ZYRTEC Take 10 mg by mouth daily.   clonazePAM 0.5 MG tablet Commonly known as: KLONOPIN Take 0.5 mg by mouth 2 (two) times daily as needed.   diclofenac sodium 1 % Gel Commonly known as: VOLTAREN   furosemide 20 MG tablet Commonly known as: LASIX Take 20 mg by mouth daily.   glipiZIDE 10 MG 24 hr tablet Commonly known as: GLUCOTROL XL TAKE (2) TABLETS BY MOUTH ONCE DAILY.   Januvia 50 MG tablet Generic drug: sitaGLIPtin Take 50 mg by mouth daily.   lisinopril 10 MG tablet Commonly known as: ZESTRIL Take 10 mg by mouth daily.   meloxicam 15 MG tablet Commonly known as: MOBIC Take 15 mg by mouth daily.   metFORMIN 1000 MG tablet Commonly known as: GLUCOPHAGE TAKE (1) TABLET TWICE A DAY WITH FOOD---BREAKFAST AND SUPPER.   metoprolol tartrate 25 MG tablet Commonly known as: LOPRESSOR Take 25 mg by mouth 2 (two) times daily.   montelukast 10 MG tablet Commonly known as: SINGULAIR TAKE ONE TABLET BY MOUTH AT BEDTIME.  omeprazole 20 MG capsule Commonly known as: PRILOSEC TAKE (1) CAPSULE BY MOUTH ONCE DAILY AS NEEDED.   rosuvastatin 20 MG tablet Commonly known as: CRESTOR Take 20 mg by mouth at bedtime.   traMADol 50 MG tablet Commonly known as: ULTRAM Take by mouth every 6 (six) hours as needed.   True Metrix Meter w/Device Kit   TRUEplus Lancets 30G Misc   venlafaxine 37.5 MG tablet Commonly known as: EFFEXOR TAKE 1 TABLET BY MOUTH TWICE DAILY   Ventolin HFA 108 (90 Base) MCG/ACT inhaler Generic drug: albuterol INHALE 2 PUFFS BY MOUTH EVERY 4 HOURS AS NEEDED   Wixela Inhub 250-50 MCG/ACT Aepb Generic drug: fluticasone-salmeterol TAKE 1 PUFF BY  MOUTH TWICE A DAY       Allergies:  Allergies  Allergen Reactions  . Doxycycline     REACTION: thrush    Family History: Family History  Problem Relation Age of Onset  . Stroke Father 69  . Diabetes Father   . Asthma Father   . Liver cancer Sister 22  . Pancreatic cancer Sister 64  . Brain cancer Brother   . Diabetes Sister   . Diabetes Sister   . Diabetes Sister   . Diabetes Sister   . Diabetes Sister   . Diabetes Sister   . Diabetes Sister   . Diabetes Paternal Grandmother   . Breast cancer Maternal Aunt 70    Social History:  reports that she quit smoking about 10 years ago. She has a 50.00 pack-year smoking history. She has never used smokeless tobacco. She reports that she does not drink alcohol and does not use drugs.   Physical Exam: BP 125/78   Pulse 81   Ht 5' 4.5" (1.638 m)   Wt 133 lb (60.3 kg)   BMI 22.48 kg/m   Constitutional:  Alert and oriented, No acute distress. HEENT: [place] AT, moist mucus membranes.  Trachea midline, no masses. Cardiovascular: No clubbing, cyanosis, or edema. Respiratory: Normal respiratory effort, no increased work of breathing. Skin: No rashes, bruises or suspicious lesions. Neurologic: Grossly intact, no focal deficits, moving all 4 extremities. Psychiatric: Normal mood and affect.  Urinalysis today is negative  Pertinent Imaging: US RENAL  Narrative CLINICAL DATA:  Stage III CKD.  EXAM: RENAL / URINARY TRACT ULTRASOUND COMPLETE  COMPARISON:  CT abdomen pelvis November 07, 2017.  FINDINGS: Right Kidney:  Renal measurements: 8.8 x 4.7 x 4.3 cm = volume: 93 mL. Echogenicity within normal limits. There is a 2.7 cm interpolar cysts which demonstrates internal septations with a probable calcification within a septation, which is slightly increased in size in comparison to CT November 07, 2017. No solid mass or hydronephrosis visualized.  Left Kidney:  Renal measurements: 9.5 x 4.8 x 4.4 cm = volume: 104  mL. Echogenicity within normal limits. No mass or hydronephrosis visualized.  Bladder:  Appears normal for degree of bladder distention.  Other:  None.  IMPRESSION: 1. No hydronephrosis. 2. Increased size of the complex cyst in the interpolar right kidney, recommend renal protocol MR with and without contrast for further evaluation.   Electronically Signed By: Dahlia Bailiff MD On: 06/26/2020 17:00  Renal ultrasound images were personally reviewed today.  These were also compared directly to CT scan from 2019 as outlined above.  Assessment & Plan:    1. Renal cyst Enlarging right incidental renal cyst with increasing complexity  We discussed the Bosniak classification system, most likely this of Bosniak 2 versus 33F.  Further characterization with MRI  would help sort this out whether or not this lesion needed to be followed.  Risk factor for malignancy this point is very low.  She is agreeable to pursue MRI of the abdomen with and without contrast.  If this is fairly benign in appearance, will give her call, if any is to be followed to have her come back in to discuss.  All questions answered. - Urinalysis, Complete  2. Other specified disorders of kidney and ureter - MR Abdomen W Wo Contrast; Future   Hollice Espy, MD  [place] [address], [place], [place] [address] [phone]

## 2020-10-22 ENCOUNTER — Ambulatory Visit: Payer: Medicare (Managed Care)

## 2020-10-29 ENCOUNTER — Ambulatory Visit: Payer: Self-pay | Admitting: Urology

## 2020-11-03 NOTE — Progress Notes (Signed)
Sareen Randon T. Tye Vigo, MD, [place] at [facility] [place], [address]  Phone: [phone]  FAX: [phone]  Sara Stafford - 75 y.o. female  MRN [id number]  Date of Birth: 1945-10-21  Date: 11/04/2020  PCP: Vidal Schwalbe, MD  Referral: Vidal Schwalbe, MD  Chief Complaint  Patient presents with   Hip Pain    Radiating down to ankle bone     This visit occurred during the SARS-CoV-2 public health emergency.  Safety protocols were in place, including screening questions prior to the visit, additional usage of staff PPE, and extensive cleaning of exam room while observing appropriate contact time as indicated for disinfecting solutions.   Subjective:   Sara Stafford is a 75 y.o. very pleasant female patient with Body mass index is 22.31 kg/m. who presents with the following:  She is a well-known patient, and I have seen her multiple times in recent years.  Seen her for knee pain off and on, and she more recently has had some lumbar radiculopathy and some hip pain.  6 months ago, I did do a combined piriformis as well as trochanteric bursa injection with some Kenalog.  Most recent x-rays from December 2021 of the lumbar spine did show some multilevel degenerative disc disease with facet arthropathy.  Slept on the L back and down the leg all the way to the foot.  This is not new, patient does have known degenerative disc disease.  Tried to dig a hole a couple of weeks ago, she thinks that this flared up her pain. She also has bilateral lateral hip pain.  Has DM, 6.3 on her last OV.   Review of Systems is noted in the HPI, as appropriate   Objective:   BP 110/74   Pulse 73   Temp 98 F (36.7 C) (Temporal)   Ht 5' 4.5" (1.638 m)   Wt 132 lb (59.9 kg)   SpO2 97%   BMI 22.31 kg/m    Range of motion at  the waist: Flexion: normal Extension: normal Lateral bending: normal Rotation: all  normal  No echymosis or edema Rises to examination table with no difficulty Gait: non antalgic  Inspection/Deformity: N Paraspinus Tenderness: L3-L5, bilateral  B Ankle Dorsiflexion (L5,4): 5/5 B Great Toe Dorsiflexion (L5,4): 5/5 Heel Walk (L5): WNL Toe Walk (S1): WNL Rise/Squat (L4): WNL  SENSORY B Medial Foot (L4): WNL B Dorsum (L5): WNL B Lateral (S1): WNL Light Touch: WNL Pinprick: WNL  REFLEXES Knee (L4): 2+ Ankle (S1): 2+  B SLR, seated: neg B SLR, supine: neg B FABER: neg B Reverse FABER: neg B Greater Troch: NT B Log Roll: neg B Sciatic Notch: NT    HIP EXAM: SIDE: Bilateral ROM: Abduction, Flexion, Internal and External range of motion: Full Pain with terminal IROM and EROM: Minimal GTB: Tender bilaterally SLR: NEG Knees: No effusion FABER: NT REVERSE FABER: NT, neg Piriformis: NT at direct palpation Str: flexion: 5/5 abduction: 5/5 adduction: 5/5 Strength testing non-tender    Radiology: No results found.  Assessment and Plan:     ICD-10-CM   1. Acute left-sided low back pain with left-sided sciatica  M54.42     2. Trochanteric bursitis, right hip  M70.61 triamcinolone acetonide (KENALOG-40) injection 40 mg    3. Trochanteric bursitis, left hip  M70.62 triamcinolone acetonide (KENALOG-40) injection 40 mg     I think that by far and away the primary driver here is her radicular  pain from her back and degenerative disc disease with almost certainly some encroachment on nerve from L4-S1 on the left.  She is still quite active, and her hips and legs are very strong.  Initially, I was going to give the patient some oral steroids, but she told me that this did not really agree with her the last time we did.  I do think is probably reasonable to inject both of her hips, and she will get some systemic absorption from this as well, and I think that this will likely also help her back.  Aspiration/Injection Procedure Note Sara Stafford 08-19-1945 Date of procedure: 11/04/2020  Procedure: Large Joint Aspiration / Injection of Hip, Trochanteric Bursa, R Indications: Pain  Procedure Details Verbal consent obtained. Risks, benefits, and alternatives reviewed. Greater trochanter sterilely prepped with Chloraprep. Ethyl Chloride used for anesthesia. 9 cc of Lidocaine 1% injected with 1 mL of Kenalog 40 mg into trochanteric bursa at area of maximal tenderness at greater trochanter. Needle taken to bone to troch bursa, flows easily. Bursa massaged. No bleeding and no complications. Decreased pain after injection. Needle: 22 gauge spinal needle Medication: 1 mL of Kenalog 40 mg   Aspiration/Injection Procedure Note Sara Stafford 12-30-1945 Date of procedure: 11/04/2020  Procedure: Large Joint Aspiration / Injection of Hip, Trochanteric Bursa, L Indications: Pain  Procedure Details Verbal consent obtained. Risks, benefits, and alternatives reviewed. Greater trochanter sterilely prepped with Chloraprep. Ethyl Chloride used for anesthesia. 9 cc of Lidocaine 1% injected with 1 mL of Kenalog 40 mg into trochanteric bursa at area of maximal tenderness at greater trochanter. Needle taken to bone to troch bursa, flows easily. Bursa massaged. No bleeding and no complications. Decreased pain after injection. Needle: 22 gauge spinal needle Medication: 1 mL of Kenalog 40 mg   Meds ordered this encounter  Medications   triamcinolone acetonide (KENALOG-40) injection 40 mg   triamcinolone acetonide (KENALOG-40) injection 40 mg   Medications Discontinued During This Encounter  Medication Reason   metFORMIN (GLUCOPHAGE) 1000 MG tablet    meloxicam (MOBIC) 15 MG tablet    No orders of the defined types were placed in this encounter.   Follow-up: No follow-ups on file.  Signed,  Maud Deed. Harim Bi, MD   Outpatient Encounter Medications as of 11/04/2020  Medication Sig   aspirin 81 MG tablet Take 81 mg by mouth as  needed.   Blood Glucose Monitoring Suppl (TRUE METRIX METER) w/Device KIT    cetirizine (ZYRTEC) 10 MG tablet Take 10 mg by mouth daily.    clonazePAM (KLONOPIN) 0.5 MG tablet Take 0.5 mg by mouth 2 (two) times daily as needed.   diclofenac sodium (VOLTAREN) 1 % GEL    furosemide (LASIX) 20 MG tablet Take 20 mg by mouth daily.   glipiZIDE (GLUCOTROL XL) 10 MG 24 hr tablet TAKE (2) TABLETS BY MOUTH ONCE DAILY.   JANUVIA 50 MG tablet Take 50 mg by mouth daily.   lisinopril (ZESTRIL) 10 MG tablet Take 10 mg by mouth daily.   metFORMIN (GLUCOPHAGE) 500 MG tablet Take 1 tablet by mouth 2 (two) times daily.   metoprolol tartrate (LOPRESSOR) 25 MG tablet Take 25 mg by mouth 2 (two) times daily.   montelukast (SINGULAIR) 10 MG tablet TAKE ONE TABLET BY MOUTH AT BEDTIME.   omeprazole (PRILOSEC) 20 MG capsule TAKE (1) CAPSULE BY MOUTH ONCE DAILY AS NEEDED.   rosuvastatin (CRESTOR) 20 MG tablet Take 20 mg by mouth at bedtime.   traMADol (ULTRAM) 50  MG tablet Take by mouth every 6 (six) hours as needed.   TRUEplus Lancets 30G MISC    venlafaxine (EFFEXOR) 37.5 MG tablet TAKE 1 TABLET BY MOUTH TWICE DAILY   VENTOLIN HFA 108 (90 Base) MCG/ACT inhaler INHALE 2 PUFFS BY MOUTH EVERY 4 HOURS AS NEEDED   WIXELA INHUB 250-50 MCG/DOSE AEPB TAKE 1 PUFF BY MOUTH TWICE A DAY   [DISCONTINUED] meloxicam (MOBIC) 15 MG tablet Take 15 mg by mouth daily.   [DISCONTINUED] metFORMIN (GLUCOPHAGE) 1000 MG tablet TAKE (1) TABLET TWICE A DAY WITH FOOD---BREAKFAST AND SUPPER.   [DISCONTINUED] omeprazole (PRILOSEC OTC) 20 MG tablet Take 1 tablet (20 mg total) by mouth daily as needed.   [EXPIRED] triamcinolone acetonide (KENALOG-40) injection 40 mg    [EXPIRED] triamcinolone acetonide (KENALOG-40) injection 40 mg    No facility-administered encounter medications on file as of 11/04/2020.

## 2020-11-04 ENCOUNTER — Other Ambulatory Visit: Payer: Self-pay

## 2020-11-04 ENCOUNTER — Ambulatory Visit (INDEPENDENT_AMBULATORY_CARE_PROVIDER_SITE_OTHER): Payer: Medicare (Managed Care) | Admitting: Family Medicine

## 2020-11-04 ENCOUNTER — Encounter: Payer: Self-pay | Admitting: Family Medicine

## 2020-11-04 VITALS — BP 110/74 | HR 73 | Temp 98.0°F | Ht 64.5 in | Wt 132.0 lb

## 2020-11-04 DIAGNOSIS — M7062 Trochanteric bursitis, left hip: Secondary | ICD-10-CM

## 2020-11-04 DIAGNOSIS — M5442 Lumbago with sciatica, left side: Secondary | ICD-10-CM | POA: Diagnosis not present

## 2020-11-04 DIAGNOSIS — M7061 Trochanteric bursitis, right hip: Secondary | ICD-10-CM

## 2020-11-04 DIAGNOSIS — M5137 Other intervertebral disc degeneration, lumbosacral region: Secondary | ICD-10-CM

## 2020-11-04 MED ORDER — TRIAMCINOLONE ACETONIDE 40 MG/ML IJ SUSP
40.0000 mg | Freq: Once | INTRAMUSCULAR | Status: AC
  Administered 2020-11-04: 40 mg via INTRA_ARTICULAR

## 2021-01-14 ENCOUNTER — Emergency Department: Payer: Medicare Other

## 2021-01-14 ENCOUNTER — Other Ambulatory Visit: Payer: Self-pay

## 2021-01-14 ENCOUNTER — Emergency Department
Admission: EM | Admit: 2021-01-14 | Discharge: 2021-01-14 | Disposition: A | Discharge status: Home / Self Care | Payer: Medicare Other | Attending: Emergency Medicine | Admitting: Emergency Medicine

## 2021-01-14 DIAGNOSIS — Z87891 Personal history of nicotine dependence: Secondary | ICD-10-CM | POA: Diagnosis not present

## 2021-01-14 DIAGNOSIS — M545 Low back pain, unspecified: Secondary | ICD-10-CM | POA: Diagnosis not present

## 2021-01-14 DIAGNOSIS — Z7982 Long term (current) use of aspirin: Secondary | ICD-10-CM | POA: Insufficient documentation

## 2021-01-14 DIAGNOSIS — J449 Chronic obstructive pulmonary disease, unspecified: Secondary | ICD-10-CM | POA: Diagnosis not present

## 2021-01-14 DIAGNOSIS — K8020 Calculus of gallbladder without cholecystitis without obstruction: Secondary | ICD-10-CM | POA: Insufficient documentation

## 2021-01-14 DIAGNOSIS — B029 Zoster without complications: Secondary | ICD-10-CM | POA: Insufficient documentation

## 2021-01-14 DIAGNOSIS — E1136 Type 2 diabetes mellitus with diabetic cataract: Secondary | ICD-10-CM | POA: Diagnosis not present

## 2021-01-14 DIAGNOSIS — Z7984 Long term (current) use of oral hypoglycemic drugs: Secondary | ICD-10-CM | POA: Insufficient documentation

## 2021-01-14 DIAGNOSIS — E785 Hyperlipidemia, unspecified: Secondary | ICD-10-CM | POA: Diagnosis not present

## 2021-01-14 DIAGNOSIS — Z7951 Long term (current) use of inhaled steroids: Secondary | ICD-10-CM | POA: Diagnosis not present

## 2021-01-14 DIAGNOSIS — E1169 Type 2 diabetes mellitus with other specified complication: Secondary | ICD-10-CM | POA: Insufficient documentation

## 2021-01-14 DIAGNOSIS — Z79899 Other long term (current) drug therapy: Secondary | ICD-10-CM | POA: Diagnosis not present

## 2021-01-14 LAB — URINALYSIS, COMPLETE (UACMP) WITH MICROSCOPIC
Bacteria, UA: NONE SEEN
Bilirubin Urine: NEGATIVE
Glucose, UA: NEGATIVE mg/dL
Hgb urine dipstick: NEGATIVE
Ketones, ur: 5 mg/dL — AB
Leukocytes,Ua: NEGATIVE
Nitrite: NEGATIVE
Protein, ur: NEGATIVE mg/dL
Specific Gravity, Urine: 1.019 (ref 1.005–1.030)
WBC, UA: NONE SEEN WBC/hpf (ref 0–5)
pH: 5 (ref 5.0–8.0)

## 2021-01-14 LAB — BASIC METABOLIC PANEL
Anion gap: 8 (ref 5–15)
BUN: 14 mg/dL (ref 8–23)
CO2: 28 mmol/L (ref 22–32)
Calcium: 8.9 mg/dL (ref 8.9–10.3)
Chloride: 97 mmol/L — ABNORMAL LOW (ref 98–111)
Creatinine, Ser: 0.97 mg/dL (ref 0.44–1.00)
GFR, Estimated: >60 mL/min (ref >60)
Glucose, Bld: 191 mg/dL — ABNORMAL HIGH (ref 70–99)
Potassium: 4.6 mmol/L (ref 3.5–5.1)
Sodium: 133 mmol/L — ABNORMAL LOW (ref 135–145)

## 2021-01-14 LAB — CBC
HCT: 33 % — ABNORMAL LOW (ref 36.0–46.0)
Hemoglobin: 10.9 g/dL — ABNORMAL LOW (ref 12.0–15.0)
MCH: 29.7 pg (ref 26.0–34.0)
MCHC: 33 g/dL (ref 30.0–36.0)
MCV: 89.9 fL (ref 80.0–100.0)
Platelets: 403 10*3/uL — ABNORMAL HIGH (ref 150–400)
RBC: 3.67 MIL/uL — ABNORMAL LOW (ref 3.87–5.11)
RDW: 13.9 % (ref 11.5–15.5)
WBC: 7.8 10*3/uL (ref 4.0–10.5)
nRBC: 0 % (ref 0.0–0.2)

## 2021-01-14 LAB — HEPATIC FUNCTION PANEL
ALT: 12 U/L (ref 0–44)
AST: 16 U/L (ref 15–41)
Albumin: 3.3 g/dL — ABNORMAL LOW (ref 3.5–5.0)
Alkaline Phosphatase: 74 U/L (ref 38–126)
Bilirubin, Direct: <0.1 mg/dL (ref 0.0–0.2)
Total Bilirubin: 0.5 mg/dL (ref 0.3–1.2)
Total Protein: 6.6 g/dL (ref 6.5–8.1)

## 2021-01-14 LAB — LIPASE, BLOOD: Lipase: 44 U/L (ref 11–51)

## 2021-01-14 MED ORDER — HYDROCODONE-ACETAMINOPHEN 5-325 MG PO TABS: 1.0000 | ORAL_TABLET | Freq: Four times a day (QID) | ORAL | 0 refills | Status: AC | PRN

## 2021-01-14 MED ORDER — LIDOCAINE 5 % EX PTCH
1.0000 | MEDICATED_PATCH | CUTANEOUS | Status: DC
  Administered 2021-01-14: 1 via TRANSDERMAL
  Filled 2021-01-14: qty 1

## 2021-01-14 MED ORDER — HYDROCODONE-ACETAMINOPHEN 5-325 MG PO TABS
1.0000 | ORAL_TABLET | Freq: Once | ORAL | Status: AC
  Administered 2021-01-14: 1 via ORAL
  Filled 2021-01-14: qty 1

## 2021-01-14 NOTE — Discharge Instructions (Addendum)
Please continue the Lyrica that you are prescribed by your PCP.  Please continue lidocaine patches every 8-12 hours.  You may take Tylenol, 650 mg every 6 hours as needed for pain.  You may also take the prescribed Norco up to every 6 hours as needed.  Please use caution with this medication as it can increase your risk for falls.  Return to the emergency department if you experience any worsening abdominal pain, fever, nausea or vomiting.  Otherwise, follow-up outpatient with your primary care as well as Dr. Tonna Boehringer of general surgery.

## 2021-01-14 NOTE — ED Notes (Signed)
See triage note. States she has had shingles and uti over the past 2 weeks   conts to have lower back pain  States pain is mainly to right side

## 2021-01-14 NOTE — ED Triage Notes (Signed)
Pt states that she was diagnosed with shingles 2 weeks ago, took her antivirals then started having lower abd pain and was diagnosed with a uti, pt reports she was placed on cipro and lyrica last Wednesday but states it caused her stomach to feel like it was burning and gnawing so she stopped the cipro thinks she may have taken 2-4 tabs, pt states now she is having a stabbing in her left flank area

## 2021-01-14 NOTE — ED Provider Notes (Signed)
[facility] Emergency Department Provider Note  ____________________________________________   Event Date/Time   First MD Initiated Contact with Patient 01/14/21 1103     (approximate)  I have reviewed the triage vital signs and the nursing notes.   HISTORY  Chief Complaint Flank Pain   HPI Sara Stafford is a 75 y.o. female who presents to the ER for evaluation of back and abdominal pain.  Patient states that roughly 2 weeks ago, she was seen by her PCP for left flank pain that radiated under the left breast.  She had a rash at that time, was diagnosed with shingles, given antiviral as well as Lyrica.  She states that a few days later, she developed lower abdominal pain, was seen at an urgent care and diagnosed with a UTI, prescribed Cipro.  She states that she took 5 days of the Cipro, however discontinued the remainder of the course due to significant GI upset.  She also discontinued the Lyrica at that time due to not feeling any difference.  She reports persistence of pain that is in the bilateral flanks, into the lower back and occasionally radiates from the bilateral flanks into the abdomen.  She reports nausea without vomiting, denies constipation or diarrhea.  She reports the pain was so severe last night she was unable to sleep.  She denies any chest pain, shortness of breath, fever or other illness symptoms.  She denies any dysuria, hematuria or vaginal discharge.  She denies any urinary or bowel incontinence, saddle anesthesia, numbness or tingling of the legs or difficulty with ambulation.         Past Medical History:  Diagnosis Date   COPD (chronic obstructive pulmonary disease) ([place])    no change with spiriva, poor tolerance of advair   Depressive disorder, not elsewhere classified    Diabetes mellitus    Fatigue    Fibromyalgia    GERD (gastroesophageal reflux disease)    Hyperlipidemia    Routine general medical examination at a  health care facility     Patient Active Problem List   Diagnosis Date Noted   GAD (generalized anxiety disorder) 05/31/2017   Vitamin D deficiency 08/03/2016   Well woman exam 07/29/2016   Osteoporosis 10/05/2015   Macular degeneration 06/02/2014   MAMMOGRAM, ABNORMAL, LEFT 09/10/2009   Pulmonary nodules 03/20/2009   ANEMIAS DUE TO DISORDERS GLUTATHIONE METABOLISM 07/31/2008   COPD GOLD II  10/11/2007   Diabetes ([place]) 06/04/2007   HYPERLIPIDEMIA 06/04/2007   Depression 06/04/2007   GERD 06/04/2007   FIBROMYALGIA 06/04/2007    Past Surgical History:  Procedure Laterality Date   ABDOMINAL HYSTERECTOMY     partial   CATARACT EXTRACTION Left 02/09/2015   CATARACT EXTRACTION Right 01/26/2015    Prior to Admission medications   Medication Sig Start Date End Date Taking? Authorizing Provider  HYDROcodone-acetaminophen (NORCO) 5-325 MG tablet Take 1 tablet by mouth every 6 (six) hours as needed for up to 3 days for moderate pain. 01/14/21 01/17/21 Yes Marlana Salvage, PA  aspirin 81 MG tablet Take 81 mg by mouth as needed.    [provider]  Blood Glucose Monitoring Suppl (TRUE METRIX METER) w/Device KIT  06/08/18   [provider]  cetirizine (ZYRTEC) 10 MG tablet Take 10 mg by mouth daily.  05/20/19   [provider]  clonazePAM (KLONOPIN) 0.5 MG tablet Take 0.5 mg by mouth 2 (two) times daily as needed. 06/03/19   [provider]  diclofenac sodium (  VOLTAREN) 1 % GEL  08/23/18   [provider]  furosemide (LASIX) 20 MG tablet Take 20 mg by mouth daily. 05/28/19   [provider]  glipiZIDE (GLUCOTROL XL) 10 MG 24 hr tablet TAKE (2) TABLETS BY MOUTH ONCE DAILY. 11/02/17   Doreene Nest, NP  JANUVIA 50 MG tablet Take 50 mg by mouth daily. 03/14/20   [provider]  lisinopril (ZESTRIL) 10 MG tablet Take 10 mg by mouth daily. 05/28/19   [provider]  metFORMIN (GLUCOPHAGE) 500 MG tablet Take 1 tablet by mouth  2 (two) times daily. 10/30/20   [provider]  metoprolol tartrate (LOPRESSOR) 25 MG tablet Take 25 mg by mouth 2 (two) times daily. 04/30/20   [provider]  montelukast (SINGULAIR) 10 MG tablet TAKE ONE TABLET BY MOUTH AT BEDTIME. 05/31/17   Doreene Nest, NP  omeprazole (PRILOSEC) 20 MG capsule TAKE (1) CAPSULE BY MOUTH ONCE DAILY AS NEEDED. 08/01/17   Doreene Nest, NP  rosuvastatin (CRESTOR) 20 MG tablet Take 20 mg by mouth at bedtime. 04/02/20   [provider]  TRUEplus Lancets 30G MISC  06/08/18   [provider]  venlafaxine (EFFEXOR) 37.5 MG tablet TAKE 1 TABLET BY MOUTH TWICE DAILY 08/28/17   Doreene Nest, NP  VENTOLIN HFA 108 (90 Base) MCG/ACT inhaler INHALE 2 PUFFS BY MOUTH EVERY 4 HOURS AS NEEDED 06/21/16   Dianne Dun, MD  WIXELA INHUB 250-50 MCG/DOSE AEPB TAKE 1 PUFF BY MOUTH TWICE A DAY 11/16/18   [provider]  omeprazole (PRILOSEC OTC) 20 MG tablet Take 1 tablet (20 mg total) by mouth daily as needed. 02/29/12 10/15/12  Dianne Dun, MD    Allergies Doxycycline  Family History  Problem Relation Age of Onset   Stroke Father 66   Diabetes Father    Asthma Father    Liver cancer Sister 23   Pancreatic cancer Sister 61   Brain cancer Brother    Diabetes Sister    Diabetes Sister    Diabetes Sister    Diabetes Sister    Diabetes Sister    Diabetes Sister    Diabetes Sister    Diabetes Paternal Grandmother    Breast cancer Maternal Aunt 60    Social History Social History   Tobacco Use   Smoking status: Former    Packs/day: 1.00    Years: 50.00    Pack years: 50.00    Types: Cigarettes    Quit date: 06/06/2010    Years since quitting: 10.6   Smokeless tobacco: Never  Substance Use Topics   Alcohol use: No    Alcohol/week: 0.0 standard drinks   Drug use: No    Review of Systems Constitutional: No fever/chills Eyes: No visual changes. ENT: No sore throat. Cardiovascular: Denies chest  pain. Respiratory: Denies shortness of breath. Gastrointestinal: + abdominal pain.  + nausea, no vomiting.  No diarrhea.  No constipation. Genitourinary: Negative for dysuria. Musculoskeletal: + back pain. Skin: Negative for rash. Neurological: Negative for headaches, focal weakness or numbness.  ____________________________________________   PHYSICAL EXAM:  VITAL SIGNS: ED Triage Vitals  Enc Vitals Group     BP 01/14/21 1002 (!) 173/103     Pulse Rate 01/14/21 1002 90     Resp 01/14/21 1002 16     Temp 01/14/21 1002 98.2 F (36.8 C)     Temp Source 01/14/21 1002 Oral     SpO2 01/14/21 1002 97 %  Weight 01/14/21 1003 126 lb (57.2 kg)     Height 01/14/21 1003 $RemoveBefor'5\' 5"'EMBkobYOvyNg$  (1.651 m)     Head Circumference --      Peak Flow --      Pain Score 01/14/21 1002 8     Pain Loc --      Pain Edu? --      Excl. in [place]? --    Constitutional: Alert and oriented. Well appearing and in no acute distress. Eyes: Conjunctivae are normal. PERRL. EOMI. Head: Atraumatic. Nose: No congestion/rhinnorhea. Mouth/Throat: Mucous membranes are moist.  Oropharynx non-erythematous. Neck: No stridor.   Cardiovascular: Normal rate, regular rhythm. Grossly normal heart sounds.  Good peripheral circulation. Respiratory: Normal respiratory effort.  No retractions. Lungs CTAB. Gastrointestinal: Soft without guarding.  Specifically no tenderness of the left or right upper quadrants.  Mild suprapubic tenderness with no lower left or right quadrant pain.  No distention. No abdominal bruits. No CVA tenderness bilaterally. Musculoskeletal: No lower extremity tenderness nor edema.  No joint effusions.  Bilateral lower extremities with 5/5 strength in ankle plantarflexion, dorsiflexion, knee flexion and extension. Neurologic:  Normal speech and language. No gross focal neurologic deficits are appreciated. No gait instability. Skin:  Skin is warm, dry and intact.  No persistence of shingles rash noted. Psychiatric: Mood  and affect are normal. Speech and behavior are normal.  ____________________________________________   LABS (all labs ordered are listed, but only abnormal results are displayed)  Labs Reviewed  URINALYSIS, COMPLETE (UACMP) WITH MICROSCOPIC - Abnormal; Notable for the following components:      Result Value   Color, Urine YELLOW (*)    APPearance HAZY (*)    Ketones, ur 5 (*)    All other components within normal limits  BASIC METABOLIC PANEL - Abnormal; Notable for the following components:   Sodium 133 (*)    Chloride 97 (*)    Glucose, Bld 191 (*)    All other components within normal limits  CBC - Abnormal; Notable for the following components:   RBC 3.67 (*)    Hemoglobin 10.9 (*)    HCT 33.0 (*)    Platelets 403 (*)    All other components within normal limits  HEPATIC FUNCTION PANEL - Abnormal; Notable for the following components:   Albumin 3.3 (*)    All other components within normal limits  URINE CULTURE  LIPASE, BLOOD   ____________________________________________  EKG  Normal sinus rhythm with a rate of 85 bpm.  No ST elevations or depressions.  Mildly prolonged PR of 196, otherwise intervals are within normal limits. ____________________________________________  RADIOLOGY  Official radiology report(s): CT Renal Stone Study  Result Date: 01/14/2021 CLINICAL DATA:  75 year old female with flank pain and suspected kidney stone. EXAM: CT ABDOMEN AND PELVIS WITHOUT CONTRAST TECHNIQUE: Multidetector CT imaging of the abdomen and pelvis was performed following the standard protocol without IV contrast. COMPARISON:  November 07, 2017. FINDINGS: Lower chest: Incidental imaging of the lung bases chest without effusion or sign of consolidative changes. Hepatobiliary: Liver with normal contour. No pericholecystic stranding but with distension of the gallbladder, filled with high density material and a large calculus in the neck of the gallbladder measuring 14 mm. Distension  of the common bile duct suggested up to 8-9 mm. No gross intrahepatic biliary duct dilation. Pancreas: Mild fatty replacement of pancreatic parenchyma. No contour abnormality or signs of inflammation. Spleen: Signs of scarring of splenic parenchyma, no change in contour compared to prior study. Adrenals/Urinary Tract: Adrenal glands  are normal. Mild bilateral perinephric stranding. No hydronephrosis. No nephrolithiasis. No ureteral calculi. Urinary bladder with smooth contour. No perivesical stranding. Cyst in the inferior RIGHT kidney posteriorly similar to prior imaging. Stomach/Bowel: No acute findings related to the gastrointestinal tract. Appendix not visualized though no secondary signs to suggest acute appendicitis. Mild colonic diverticulosis. Vascular/Lymphatic: Aortic atherosclerosis without aneurysm. Smooth contour of the IVC which is mildly flattened. There is no gastrohepatic or hepatoduodenal ligament lymphadenopathy. No retroperitoneal or mesenteric lymphadenopathy. No pelvic sidewall lymphadenopathy. Reproductive: Post hysterectomy.  No adnexal masses. Other: No ascites.  No free air. Musculoskeletal: No acute bone finding or destructive bone process. Spinal degenerative changes. Grade 1 anterolisthesis of L5 on S1 is unchanged compared to previous imaging. IMPRESSION: 1. Distended gallbladder, filled with high density material and a large calculus in the neck of the gallbladder measuring 14 mm. Distension of the common bile duct up to 8-9 mm, increased compared to prior imaging. No gross intrahepatic biliary duct dilation. Large gallstone within this location as far back as 2019. No current evidence of acute inflammation of the gallbladder. Correlate with any symptoms that would suggest biliary colic and with any laboratory values that would suggest even mild obstruction. Consider MRCP as warranted for further evaluation. 2. Mild bilateral perinephric stranding. No hydronephrosis or nephrolithiasis.  Perinephric stranding more likely chronic and symmetric. Correlation with urine studies may be helpful. 3. Mild colonic diverticulosis without evidence of acute gastrointestinal tract. 4. Aortic atherosclerosis. Aortic Atherosclerosis (ICD10-I70.0). Electronically Signed   By: Zetta Bills M.D.   On: 01/14/2021 12:12      ____________________________________________   INITIAL IMPRESSION / ASSESSMENT AND PLAN / ED COURSE  As part of my medical decision making, I reviewed the following data within the [place] notes reviewed and incorporated, Labs reviewed, Notes from prior ED visits, and [place] Controlled Substance Database        Patient is a 75 year old female who reports to the emergency department primarily complaining of low back pain with some bilateral flank pain radiating into the abdomen that initially began with course of shingles 2 weeks ago followed and complicated by UTI treated with 5 days of Cipro.  See HPI for further details.  In triage, patient is hypertensive, but otherwise has normal vital signs.  She denies any headache, chest pain, blurred vision related to the hypertension and states that this is common for her when she is in pain.  She does endorse taking her lisinopril this morning as previously prescribed.  On physical exam patient does not have any quadrant tenderness to palpation, no rebounding or guarding.  No CVA tenderness.  There is lower back pain present, however the patient is neurovascularly intact with no red flag symptoms.  Labs were obtained, BMP with mild hyponatremia of 133, mild hypochloremia of 97, elevated glucose of 191, otherwise within normal limits.  Hepatic function panel with very mild decrease in albumin, otherwise within normal limits.  Urinalysis with 5 ketones, however no bacteria, leukocytes or nitrites, no hematuria.  Urinalysis was sent for culture given recent treatment with Cipro, though the patient has been off of  this for several days.  CBC does demonstrate very mild hemoglobin anemia of 10.9, otherwise grossly normal.  CT renal study was obtained given the patient's flank pain associated with recent treatment for UTI, however is negative for renal calculus.  There is distention of the gallbladder without any findings to suggest cholecystitis, and the patient does not have any right upper  quadrant tenderness to palpation.  LFTs and lipase are within normal limits.  Discussed these findings with attending Dr. Archie Balboa, who recommends outpatient general surgery consult, no indication for acute consult today.  The patient is already established with urology with Dr. Erlene Quan, has an appointment in less than 2 weeks, recommend that she keep this regarding the bilateral likely chronic perinephric stranding.  In the interim, we will treat the patient's pain likely related to low back pain possibly exacerbated by recent episode of shingles.  The patient cannot tolerate anti-inflammatories due to stage III kidney disease.  Recommended lidocaine patches.  Patient does not want to attempt any muscle relaxants as these previously have made her vomit.  I will prescribe a very short course of Norco for her pain, however discussed the increased fall risk and caution with this medication.  She is understanding.  Return precautions were discussed at length, patient stable this time for outpatient follow-up.       ____________________________________________   FINAL CLINICAL IMPRESSION(S) / ED DIAGNOSES  Final diagnoses:  Acute bilateral low back pain without sciatica  Herpes zoster without complication  Calculus of gallbladder without cholecystitis without obstruction     ED Discharge Orders          Ordered    HYDROcodone-acetaminophen (NORCO) 5-325 MG tablet  Every 6 hours PRN        01/14/21 1358             Note:  This document was prepared using Dragon voice recognition software and may include unintentional  dictation errors.    Marlana Salvage, PA 01/14/21 Pauline Aus    Nance Pear, MD 01/14/21 [phone]

## 2021-01-15 LAB — URINE CULTURE

## 2021-01-25 NOTE — Progress Notes (Signed)
01/26/21 1:36 PM   Sara Stafford Sep 18, 1945 [id number]  Referring provider:  Vidal Schwalbe, MD [address] [place],  [place] [address] Chief Complaint  Patient presents with   Flank Pain     HPI: Sara Stafford is a 75 y.o.female who presents today to further evaluate left flank pain.   She underwent a RUS on 06/26/2020 for further evaluation of chronic kidney disease. She has a 2.7 cm interpolar right lower cyst with internal septations and a probable calcification within the septum.   She was seen in the ED on 01/14/2021 for back and abdominal pain. She was seen by her PCP 2 weeks prior for left flank pain that radiated under the left breast. She had a rash at that time, was diagnosed with shingles, given antiviral as well as Lyrica.    She states that a few days later, she developed lower abdominal pain, was seen at an urgent care and diagnosed with a UTI, prescribed Cipro.  She states that she took 5 days of the Cipro, however discontinued the remainder of the course due to significant GI upset.    UA at the time showed 5! Ketones but was otherwise negative. Urine culture showed positive for multiple species and suggested recollection. CT renal stone study showed adrenal glands are normal. Mild bilateral perinephric stranding. No hydronephrosis. No nephrolithiasis. No ureteral calculi. Urinary bladder with smooth contour. No perivesical stranding. Cyst in the inferior RIGHT kidney posteriorly similar to prior imaging. She was prescribed Norco for her pain because she is unable to tolerate ant-inflammatories due to stage III kidney disease.   She has some baseline CKD.  Most recent creatinine 1.07, GFR 50.    She states today she is not having UTI symptoms. Urine is negative today.   PMH: Past Medical History:  Diagnosis Date   COPD (chronic obstructive pulmonary disease) (HCC)    no change with spiriva, poor tolerance of advair   Depressive disorder, not  elsewhere classified    Diabetes mellitus    Fatigue    Fibromyalgia    GERD (gastroesophageal reflux disease)    Hyperlipidemia    Routine general medical examination at a health care facility     Surgical History: Past Surgical History:  Procedure Laterality Date   ABDOMINAL HYSTERECTOMY     partial   CATARACT EXTRACTION Left 02/09/2015   CATARACT EXTRACTION Right 01/26/2015    Home Medications:  Allergies as of 01/26/2021       Reactions   Doxycycline    REACTION: thrush        Medication List        Accurate as of January 26, 2021  1:36 PM. If you have any questions, ask your nurse or doctor.          aspirin 81 MG tablet Take 81 mg by mouth as needed.   cetirizine 10 MG tablet Commonly known as: ZYRTEC Take 10 mg by mouth daily.   clonazePAM 0.5 MG tablet Commonly known as: KLONOPIN Take 0.5 mg by mouth 2 (two) times daily as needed.   diclofenac sodium 1 % Gel Commonly known as: VOLTAREN   furosemide 20 MG tablet Commonly known as: LASIX Take 20 mg by mouth daily.   glipiZIDE 10 MG 24 hr tablet Commonly known as: GLUCOTROL XL TAKE (2) TABLETS BY MOUTH ONCE DAILY.   Januvia 50 MG tablet Generic drug: sitaGLIPtin Take 50 mg by mouth daily.   lisinopril 10 MG tablet Commonly known  as: ZESTRIL Take 10 mg by mouth daily.   metFORMIN 500 MG tablet Commonly known as: GLUCOPHAGE Take 1 tablet by mouth 2 (two) times daily.   metoprolol tartrate 25 MG tablet Commonly known as: LOPRESSOR Take 25 mg by mouth 2 (two) times daily.   montelukast 10 MG tablet Commonly known as: SINGULAIR TAKE ONE TABLET BY MOUTH AT BEDTIME.   omeprazole 20 MG capsule Commonly known as: PRILOSEC TAKE (1) CAPSULE BY MOUTH ONCE DAILY AS NEEDED.   rosuvastatin 20 MG tablet Commonly known as: CRESTOR Take 20 mg by mouth at bedtime.   True Metrix Meter w/Device Kit   TRUEplus Lancets 30G Misc   venlafaxine 37.5 MG tablet Commonly known as: EFFEXOR TAKE 1  TABLET BY MOUTH TWICE DAILY   Ventolin HFA 108 (90 Base) MCG/ACT inhaler Generic drug: albuterol INHALE 2 PUFFS BY MOUTH EVERY 4 HOURS AS NEEDED   Wixela Inhub 250-50 MCG/ACT Aepb Generic drug: fluticasone-salmeterol TAKE 1 PUFF BY MOUTH TWICE A DAY        Allergies:  Allergies  Allergen Reactions   Doxycycline     REACTION: thrush    Family History: Family History  Problem Relation Age of Onset   Stroke Father 59   Diabetes Father    Asthma Father    Liver cancer Sister 74   Pancreatic cancer Sister 51   Brain cancer Brother    Diabetes Sister    Diabetes Sister    Diabetes Sister    Diabetes Sister    Diabetes Sister    Diabetes Sister    Diabetes Sister    Diabetes Paternal Grandmother    Breast cancer Maternal Aunt 70    Social History:  reports that she quit smoking about 10 years ago. She has a 50.00 pack-year smoking history. She has never used smokeless tobacco. She reports that she does not drink alcohol and does not use drugs.   Physical Exam: BP 139/71   Pulse 85   Ht _0  (1.651 m)   Wt 128 lb (58.1 kg)   BMI 21.30 kg/m   Constitutional:  Alert and oriented, No acute distress. HEENT: [place] AT, moist mucus membranes.  Trachea midline, no masses. Cardiovascular: No clubbing, cyanosis, or edema. Respiratory: Normal respiratory effort, no increased work of breathing. Skin: No rashes, bruises or suspicious lesions. Neurologic: Grossly intact, no focal deficits, moving all 4 extremities. Psychiatric: Normal mood and affect.  Laboratory Data: Lab Results  Component Value Date   CREATININE 0.97 01/14/2021    Lab Results  Component Value Date   HGBA1C 7.2 (H) 04/07/2017    Urinalysis Results for orders placed or performed in visit on 01/26/21  Microscopic Examination   Urine  Result Value Ref Range   WBC, UA 0-5 0 - 5 /hpf   RBC 0-2 0 - 2 /hpf   Epithelial Cells (non renal) 0-10 0 - 10 /hpf   Casts Present (A) None seen /lpf   Cast Type  Hyaline casts N/A   Bacteria, UA Few None seen/Few  Urinalysis, Complete  Result Value Ref Range   Specific Gravity, UA 1.010 1.005 - 1.030   pH, UA 5.0 5.0 - 7.5   Color, UA Yellow Yellow   Appearance Ur Hazy (A) Clear   Leukocytes,UA Negative Negative   Protein,UA Negative Negative/Trace   Glucose, UA Negative Negative   Ketones, UA Trace (A) Negative   RBC, UA Negative Negative   Bilirubin, UA Negative Negative   Urobilinogen, Ur 0.2 0.2 -  1.0 mg/dL   Nitrite, UA Negative Negative   Microscopic Examination See below:     Pertinent Imaging: CLINICAL DATA:  75 year old female with flank pain and suspected kidney stone.   IMPRESSION: 1. Distended gallbladder, filled with high density material and a large calculus in the neck of the gallbladder measuring 14 mm. Distension of the common bile duct up to 8-9 mm, increased compared to prior imaging. No gross intrahepatic biliary duct dilation. Large gallstone within this location as far back as 2019. No current evidence of acute inflammation of the gallbladder. Correlate with any symptoms that would suggest biliary colic and with any laboratory values that would suggest even mild obstruction. Consider MRCP as warranted for further evaluation. 2. Mild bilateral perinephric stranding. No hydronephrosis or nephrolithiasis. Perinephric stranding more likely chronic and symmetric. Correlation with urine studies may be helpful. 3. Mild colonic diverticulosis without evidence of acute gastrointestinal tract. 4. Aortic atherosclerosis.   Aortic Atherosclerosis (ICD10-I70.0).     Electronically Signed   By: Zetta Bills M.D.   On: 01/14/2021 12:12  Above CT scan images personally reviewed  Assessment & Plan:    Left flank pain  - secondary to shingles not related to kidney   2. Renal cyst  - Recommend definitive imaging; continue to recommend this - Will reauthorize MRI abdomen W/WO contrast due to cancellation without  reschedule.   3. History of UTI UA today negative, no longer symptomatic Infections less infrequently, no intervention needed   Will call with MRI results  I,Kailey Littlejohn,acting as a scribe for Hollice Espy, MD.,have documented all relevant documentation on the behalf of Hollice Espy, MD,as directed by  Hollice Espy, MD while in the presence of Hollice Espy, [place] [address], [place], [place] [address] [phone]  I spent 30 total minutes on the day of the encounter including pre-visit review of the medical record, face-to-face time with the patient, and post visit ordering of labs/imaging/tests.  Chart and imaging reviewed in detail.

## 2021-01-26 ENCOUNTER — Ambulatory Visit (INDEPENDENT_AMBULATORY_CARE_PROVIDER_SITE_OTHER): Payer: Medicare Other | Admitting: Urology

## 2021-01-26 ENCOUNTER — Other Ambulatory Visit: Payer: Self-pay

## 2021-01-26 ENCOUNTER — Encounter: Payer: Self-pay | Admitting: Urology

## 2021-01-26 VITALS — BP 139/71 | HR 85 | Ht 65.0 in | Wt 128.0 lb

## 2021-01-26 DIAGNOSIS — R109 Unspecified abdominal pain: Secondary | ICD-10-CM

## 2021-01-26 DIAGNOSIS — N281 Cyst of kidney, acquired: Secondary | ICD-10-CM

## 2021-01-26 DIAGNOSIS — N2889 Other specified disorders of kidney and ureter: Secondary | ICD-10-CM

## 2021-01-27 LAB — MICROSCOPIC EXAMINATION

## 2021-01-27 LAB — URINALYSIS, COMPLETE
Bilirubin, UA: NEGATIVE
Glucose, UA: NEGATIVE
Leukocytes,UA: NEGATIVE
Nitrite, UA: NEGATIVE
Protein,UA: NEGATIVE
RBC, UA: NEGATIVE
Specific Gravity, UA: 1.01 (ref 1.005–1.030)
Urobilinogen, Ur: 0.2 mg/dL (ref 0.2–1.0)
pH, UA: 5 (ref 5.0–7.5)

## 2021-02-04 ENCOUNTER — Ambulatory Visit: Payer: Medicare Other | Admitting: Family Medicine

## 2021-02-11 ENCOUNTER — Ambulatory Visit
Admission: RE | Admit: 2021-02-11 | Discharge: 2021-02-11 | Disposition: A | Discharge status: Home / Self Care | Payer: Medicare Other | Source: Ambulatory Visit | Attending: Urology | Admitting: Urology

## 2021-02-11 ENCOUNTER — Other Ambulatory Visit: Payer: Self-pay

## 2021-02-11 DIAGNOSIS — R109 Unspecified abdominal pain: Secondary | ICD-10-CM | POA: Insufficient documentation

## 2021-02-11 DIAGNOSIS — N281 Cyst of kidney, acquired: Secondary | ICD-10-CM | POA: Diagnosis present

## 2021-02-11 DIAGNOSIS — N2889 Other specified disorders of kidney and ureter: Secondary | ICD-10-CM

## 2021-02-11 DIAGNOSIS — R10A0 Flank pain, unspecified side: Secondary | ICD-10-CM

## 2021-02-11 MED ORDER — GADOBUTROL 1 MMOL/ML IV SOLN
6.0000 mL | Freq: Once | INTRAVENOUS | Status: AC | PRN
  Administered 2021-02-11: 5 mL via INTRAVENOUS

## 2021-02-15 ENCOUNTER — Telehealth: Payer: Self-pay | Admitting: *Deleted

## 2021-02-15 NOTE — Telephone Encounter (Addendum)
Patient advised, voiced understanding.      ----- Message from Sara Scotland, MD sent at 02/15/2021  9:20 AM EDT ----- Please let this patient know that I have reviewed her MRI and this cyst looks benign, noncancerous.  It does not need to be followed thus she can follow-up as needed.  Sara Scotland, MD

## 2021-03-19 ENCOUNTER — Other Ambulatory Visit: Payer: Self-pay | Admitting: General Surgery

## 2021-03-19 DIAGNOSIS — K8020 Calculus of gallbladder without cholecystitis without obstruction: Secondary | ICD-10-CM

## 2021-03-19 NOTE — Progress Notes (Signed)
Subjective:     Patient ID: Sara Stafford is a 75 y.o. female.   HPI   The following portions of the patient's history were reviewed and updated as appropriate.   This a new patient is here today for: office visit. Here for evaluation of her gall bladder referred by [facility].  She states back in August started having right abdominal pain describes as a sharp pain that doesn't last. She denies nausea or vomiting. Not associated with foods.  This pain, when questioned is really more of a discomfort or awareness rather than sharp pain.   She has been diabetic for 20+ years.  She does not tend to check her sugars.     The patient had shingles started in March on the left side.   She is here with her daughter in law, Tityana Pagan.   Renal CT scan was 01-14-2021 while in the ED when she presented with left flank pain. Renal cysts identified and MR abdomen completed on 02-11-2021.   The patient reports she is lost about 10 or 15 pounds over the last 3-4 months.  She attributes weight loss to the recent initiation of Januvia.     She reports eating 3 good meals a day and snacking frequently.  She has not noticed any correlation between dietary intake and abdominal pain.  She does not eat a particularly high fat diet, no bacon, sausage, pork products.   No change in her bowel habits.  No history of blood in the rectum.  Only surgical experience was a partial hysterectomy.       Review of Systems  Constitutional: Negative for chills and fever.  Respiratory: Negative for cough.   Gastrointestinal: Negative for nausea and vomiting.         Chief Complaint  Patient presents with   Abdominal Pain      BP 124/72   Pulse 85   Temp 36.6 C (97.8 F)   Ht 165.1 cm (_0 )   Wt 58.5 kg (129 lb)   SpO2 97%   BMI 21.47 kg/m        Past Medical History:  Diagnosis Date   CKD (chronic kidney disease)     COPD (chronic obstructive pulmonary disease) (CMS-HCC)     Depressive  disorder     Diabetes (CMS-HCC)     Fatigue     Fibromyalgia     GERD (gastroesophageal reflux disease)     Hyperlipidemia     Macular degeneration     Osteoarthritis     Sciatica             Past Surgical History:  Procedure Laterality Date   ABDOMINAL HYSTERECTOMY       CATARACT EXTRACTION Left 02/09/2015   CATARACT EXTRACTION Right 01/26/2015                OB History     Gravida  2   Para  2   Term      Preterm      AB      Living         SAB      IAB      Ectopic      Molar      Multiple      Live Births           Obstetric Comments  Age at first period 40 Age of first pregnancy 55  Social History           Socioeconomic History   Marital status: Married  Tobacco Use   Smoking status: Former Smoker      Packs/day: 1.00      Years: 50.00      Pack years: 50.00      Types: Cigarettes      Quit date: 06/06/2010      Years since quitting: 10.7   Smokeless tobacco: Never Used  Substance and Sexual Activity   Alcohol use: Defer   Drug use: Never             Allergies  Allergen Reactions   Doxycycline Other (See Comments)      thrush      Current Medications        Current Outpatient Medications  Medication Sig Dispense Refill   acetaminophen (TYLENOL) 325 MG tablet Take 650 mg by mouth once daily       ADVAIR DISKUS 250-50 mcg/dose diskus inhaler 2 (two) times daily       albuterol 90 mcg/actuation inhaler as needed       azelastine (ASTELIN) 137 mcg nasal spray as needed       clonazePAM (KLONOPIN) 0.5 MG tablet 0.5 tablets at bedtime       diclofenac (VOLTAREN) 1 % topical gel as needed       glipiZIDE (GLUCOTROL XL) 10 MG XL tablet once daily       lisinopriL (ZESTRIL) 10 MG tablet 1 tablet once daily       metFORMIN (GLUCOPHAGE) 1000 MG tablet Take 500 mg by mouth 2 (two) times daily with meals Per pt       metoprolol tartrate (LOPRESSOR) 25 MG tablet 2 (two) times daily       montelukast (SINGULAIR) 10 mg  tablet once daily       omeprazole (PRILOSEC) 20 MG DR capsule once daily       pregabalin (LYRICA) 100 MG capsule Take 100 mg by mouth at bedtime       rosuvastatin (CRESTOR) 20 MG tablet Take by mouth at bedtime       SITagliptin (JANUVIA) 50 MG tablet 1 tablet once daily       venlafaxine (EFFEXOR) 37.5 MG tablet 2 (two) times daily        No current facility-administered medications for this visit.             Family History  Problem Relation Age of Onset   Asthma Father     Diabetes Father     Stroke Father     Liver cancer Sister     Pancreatic cancer Sister     Brain cancer Brother     Diabetes Paternal Grandmother     Breast cancer Maternal Aunt          Labs and Radiology:    Laboratory from presentation to the ED with left flank pain and discharge diagnosis of herpes zoster:     Component Ref Range & Units 2 mo ago  (01/14/21) 5 mo ago  (09/29/20) 3 yr ago  (11/07/17) 3 yr ago  (04/07/17) 4 yr ago  (07/29/16) 5 yr ago  (07/24/15) 7 yr ago  (04/23/13)  WBC 4.0 - 10.5 K/uL 7.8    13.2 High  R  6.4  6.6  8.1  6.6 R   RBC 3.87 - 5.11 MIL/uL 3.67 Low   None seen R  4.72 R  4.76 R  4.51 R  4.34 R  4.65 R   Hemoglobin 12.0 - 15.0 g/dL 10.9 Low     13.8 R  13.8  13.2  12.9  14.0   HCT 36.0 - 46.0 % 33.0 Low     41.2 R  43.2  40.2  38.6  42.1   MCV 80.0 - 100.0 fL 89.9    87.2  90.7 R  89.3 R  88.9 R  90.5 R   MCH 26.0 - 34.0 pg 29.7    29.2           MCHC 30.0 - 36.0 g/dL 33.0    33.5 R  32.0  32.8  33.4  33.2   RDW 11.5 - 15.5 % 13.9    14.6 High  R  13.7  13.9  13.8  13.7 R   Platelets 150 - 400 K/uL 403 High     329 R, CM  291.0 R  301.0 R  318.0 R  307.0 R   nRBC 0.0 - 0.2 % 0.0                 Component Ref Range & Units 2 mo ago  (01/14/21) 3 yr ago  (11/07/17) 3 yr ago  (11/07/17) 3 yr ago  (04/07/17) 4 yr ago  (05/16/16) 5 yr ago  (07/24/15) 6 yr ago  (06/02/14)  Sodium 135 - 145 mmol/L 133 Low   126 Low   127 Low   138 R  139 R  137 R  138 R   Potassium 3.5 - 5.1  mmol/L 4.6  3.8  4.1  4.7 R  4.4 R  4.5 R  4.0 R   Chloride 98 - 111 mmol/L 97 Low   93 Low  R  90 Low  R  99 R  101 R  99 R  101 R   CO2 22 - 32 mmol/L _0 R  29 R  30 R  28 R   Glucose, Bld 70 - 99 mg/dL 191 High   127 High  R  206 High  R  144 High   129 High   174 High   193 High    Comment: Glucose reference range applies only to samples taken after fasting for at least 8 hours.  BUN 8 - 23 mg/dL 14  9 R  11 R  12 R  11 R  14 R  12 R   Creatinine, Ser 0.44 - 1.00 mg/dL 0.97  0.75  0.81  0.74 R  0.67 R  0.78 R  0.7 R   Calcium 8.9 - 10.3 mg/dL 8.9  8.2 Low   9.7  10.1 R  9.2 R  9.8 R  9.4 R   GFR, Estimated >60 mL/min >60                   Component Ref Range & Units 2 mo ago 3 yr ago  Lipase 11 - 51 U/L 44  33 CM         CT of the abdomen January 14, 2021 for suspected ureteral calculus:   IMPRESSION: 1. Distended gallbladder, filled with high density material and a large calculus in the neck of the gallbladder measuring 14 mm. Distension of the common bile duct up to 8-9 mm, increased compared to prior imaging. No gross intrahepatic biliary duct dilation. Large gallstone within this location as far back as 2019. No current evidence of acute inflammation of  the gallbladder. Correlate with any symptoms that would suggest biliary colic and with any laboratory values that would suggest even mild obstruction. Consider MRCP as warranted for further evaluation. 2. Mild bilateral perinephric stranding. No hydronephrosis or nephrolithiasis. Perinephric stranding more likely chronic and symmetric. Correlation with urine studies may be helpful. 3. Mild colonic diverticulosis without evidence of acute gastrointestinal tract. 4. Aortic atherosclerosis.   This film was independently reviewed.  Comparison with the November 07, 2017 CT of the abdomen pelvis showed the stones (calcified) in the same location, same size in the gallbladder wall again unremarkable.  No pericholecystic  fluid or inflammation on either study.   February 11, 2021 MRI of the abdomen:   IMPRESSION: 1. There is a benign, thinly septated cyst of the posterior midportion of the right kidney measuring 2.3 x 2.1 cm. There is no associated nodular component or septal contrast enhancement. No further follow-up or characterization required. 2. Gallstones and sludge in the gallbladder. No biliary ductal dilatation.      Objective:   Physical Exam Exam conducted with a chaperone present.  Constitutional:      Appearance: Normal appearance.  Cardiovascular:     Rate and Rhythm: Normal rate and regular rhythm.     Pulses: Normal pulses.          Carotid pulses are 2+ on the right side and 2+ on the left side.      Femoral pulses are 2+ on the right side and 2+ on the left side.    Heart sounds: Normal heart sounds.  Pulmonary:     Effort: Pulmonary effort is normal.     Breath sounds: Normal breath sounds.  Abdominal:     General: Abdomen is flat. Bowel sounds are normal.     Palpations: Abdomen is soft.     Tenderness: There is abdominal tenderness in the right upper quadrant. Negative signs include Murphy's sign.     Hernia: No hernia is present.       Comments: Mild tenderness to palpation in the right upper quadrant without focal tenderness in the gallbladder fossa.  Musculoskeletal:     Cervical back: Neck supple.     Right lower leg: No edema.     Left lower leg: No edema.  Skin:    General: Skin is warm and dry.  Neurological:     Mental Status: She is alert and oriented to person, place, and time.  Psychiatric:        Mood and Affect: Mood normal.        Behavior: Behavior normal.           Assessment:     New normal chromic anemia.   Longstanding cholelithiasis, doubtful cholecystitis.   Recent herpes zoster.   Uncomplicated renal cyst.   No prior colon screening.    Plan:     Laboratory studies obtained today to assess the above-noted anemia:    Component  Ref Range & Units 1 d ago   Glucose 70 - 110 mg/dL 162 High    Sodium 136 - 145 mmol/L 138   Potassium 3.6 - 5.1 mmol/L 5.0   Chloride 97 - 109 mmol/L 103   Carbon Dioxide (CO2) 22.0 - 32.0 mmol/L 31.1   Urea Nitrogen (BUN) 7 - 25 mg/dL 13   Creatinine 0.6 - 1.1 mg/dL 1.0   Glomerular Filtration Rate (eGFR), MDRD Estimate >60 mL/min/1.73sq m 54 Low    Calcium 8.7 - 10.3 mg/dL 9.8   AST  8 -  39 U/L 16   ALT  5 - 38 U/L 9   Alk Phos (alkaline Phosphatase) 34 - 104 U/L 65   Albumin 3.5 - 4.8 g/dL 4.3   Bilirubin, Total 0.3 - 1.2 mg/dL 0.4   Protein, Total 6.1 - 7.9 g/dL 6.6   A/G Ratio 1.0 - 5.0 gm/dL 1.9     Hemoglobin A1C 4.2 - 5.6 % 6.5 High    Average Blood Glucose (Calc) mg/dL 140       WBC (White Blood Cell Count) 4.1 - 10.2 10^3/uL 6.4   RBC (Red Blood Cell Count) 4.04 - 5.48 10^6/uL 3.97 Low    Hemoglobin 12.0 - 15.0 gm/dL 11.4 Low    Hematocrit 35.0 - 47.0 % 37.1   MCV (Mean Corpuscular Volume) 80.0 - 100.0 fl 93.5   MCH (Mean Corpuscular Hemoglobin) 27.0 - 31.2 pg 28.7   MCHC (Mean Corpuscular Hemoglobin Concentration) 32.0 - 36.0 gm/dL 30.7 Low    Platelet Count 150 - 450 10^3/uL 299   RDW-CV (Red Cell Distribution Width) 11.6 - 14.8 % 13.2   MPV (Mean Platelet Volume) 9.4 - 12.4 fl 10.1   Neutrophils 1.50 - 7.80 10^3/uL 4.75   Lymphocytes 1.00 - 3.60 10^3/uL 1.21   Monocytes 0.00 - 1.50 10^3/uL 0.31   Eosinophils 0.00 - 0.55 10^3/uL 0.08   Basophils 0.00 - 0.09 10^3/uL 0.05   Neutrophil % 32.0 - 70.0 % 74.1 High    Lymphocyte % 10.0 - 50.0 % 18.8   Monocyte % 4.0 - 13.0 % 4.8   Eosinophil % 1.0 - 5.0 % 1.2   Basophil% 0.0 - 2.0 % 0.8   Immature Granulocyte % <=0.7 % 0.3   Immature Granulocyte Count <=0.06 10^3/L 0.02     Iron 28 - 170 ug/dL 80   Total Iron Binding Capacity (TIBC) 261.0 - 478.0 ug/dL 403.6   Transferrin 203.0 - 362.0 mg/dL 288.3   % Saturation % 20     The patient has been asked to complete a set of stool Hemoccult cards.  In light of her  vague pain and new anemia, although not microcytic, upper and lower endoscopy has been recommended.  The procedure was reviewed and risk discussed.   We will arrange for a HIDA scan anticipating a functional gallbladder.   Follow-up will take place after the imaging and endoscopic studies are completed.   This note is partially prepared by Karie Fetch, RN, acting as a scribe in the presence of Dr. Hervey Ard, MD.  The documentation recorded by the scribe accurately reflects the service I personally performed and the decisions made by me.    Robert Bellow, MD FACS

## 2021-04-06 ENCOUNTER — Other Ambulatory Visit: Payer: Self-pay

## 2021-04-06 ENCOUNTER — Ambulatory Visit
Admission: RE | Admit: 2021-04-06 | Discharge: 2021-04-06 | Disposition: A | Discharge status: Home / Self Care | Payer: Medicare Other | Source: Ambulatory Visit | Attending: General Surgery | Admitting: General Surgery

## 2021-04-06 DIAGNOSIS — K8020 Calculus of gallbladder without cholecystitis without obstruction: Secondary | ICD-10-CM | POA: Diagnosis present

## 2021-04-06 MED ORDER — TECHNETIUM TC 99M MEBROFENIN IV KIT
5.0000 | PACK | Freq: Once | INTRAVENOUS | Status: AC | PRN
  Administered 2021-04-06: 5.5 via INTRAVENOUS

## 2021-04-27 ENCOUNTER — Encounter: Payer: Self-pay | Admitting: General Surgery

## 2021-04-28 ENCOUNTER — Encounter
Admission: RE | Disposition: A | Discharge status: Home / Self Care | Payer: Self-pay | Source: Home / Self Care | Attending: General Surgery

## 2021-04-28 ENCOUNTER — Ambulatory Visit: Payer: Medicare Other | Admitting: Anesthesiology

## 2021-04-28 ENCOUNTER — Ambulatory Visit
Admission: RE | Admit: 2021-04-28 | Discharge: 2021-04-28 | Disposition: A | Discharge status: Home / Self Care | Payer: Medicare Other | Attending: General Surgery | Admitting: General Surgery

## 2021-04-28 DIAGNOSIS — Z87891 Personal history of nicotine dependence: Secondary | ICD-10-CM | POA: Insufficient documentation

## 2021-04-28 DIAGNOSIS — R1011 Right upper quadrant pain: Secondary | ICD-10-CM | POA: Insufficient documentation

## 2021-04-28 DIAGNOSIS — Z7984 Long term (current) use of oral hypoglycemic drugs: Secondary | ICD-10-CM | POA: Insufficient documentation

## 2021-04-28 DIAGNOSIS — Q438 Other specified congenital malformations of intestine: Secondary | ICD-10-CM | POA: Diagnosis not present

## 2021-04-28 DIAGNOSIS — K219 Gastro-esophageal reflux disease without esophagitis: Secondary | ICD-10-CM | POA: Diagnosis not present

## 2021-04-28 DIAGNOSIS — J449 Chronic obstructive pulmonary disease, unspecified: Secondary | ICD-10-CM | POA: Diagnosis not present

## 2021-04-28 DIAGNOSIS — Z1211 Encounter for screening for malignant neoplasm of colon: Secondary | ICD-10-CM | POA: Insufficient documentation

## 2021-04-28 DIAGNOSIS — K317 Polyp of stomach and duodenum: Secondary | ICD-10-CM | POA: Diagnosis not present

## 2021-04-28 DIAGNOSIS — E119 Type 2 diabetes mellitus without complications: Secondary | ICD-10-CM | POA: Diagnosis not present

## 2021-04-28 DIAGNOSIS — D649 Anemia, unspecified: Secondary | ICD-10-CM | POA: Insufficient documentation

## 2021-04-28 DIAGNOSIS — K2950 Unspecified chronic gastritis without bleeding: Secondary | ICD-10-CM | POA: Insufficient documentation

## 2021-04-28 DIAGNOSIS — I10 Essential (primary) hypertension: Secondary | ICD-10-CM | POA: Diagnosis not present

## 2021-04-28 DIAGNOSIS — K635 Polyp of colon: Secondary | ICD-10-CM | POA: Insufficient documentation

## 2021-04-28 HISTORY — PX: COLONOSCOPY WITH PROPOFOL: SHX5780

## 2021-04-28 HISTORY — PX: ESOPHAGOGASTRODUODENOSCOPY (EGD) WITH PROPOFOL: SHX5813

## 2021-04-28 HISTORY — DX: Unspecified osteoarthritis, unspecified site: M19.90

## 2021-04-28 LAB — GLUCOSE, CAPILLARY: Glucose-Capillary: 142 mg/dL — ABNORMAL HIGH (ref 70–99)

## 2021-04-28 SURGERY — ESOPHAGOGASTRODUODENOSCOPY (EGD) WITH PROPOFOL
Anesthesia: General

## 2021-04-28 MED ORDER — PROPOFOL 10 MG/ML IV BOLUS
INTRAVENOUS | Status: DC | PRN
  Administered 2021-04-28: 10 mg via INTRAVENOUS
  Administered 2021-04-28: 70 mg via INTRAVENOUS
  Administered 2021-04-28: 10 mg via INTRAVENOUS

## 2021-04-28 MED ORDER — DEXMEDETOMIDINE HCL IN NACL 200 MCG/50ML IV SOLN
INTRAVENOUS | Status: AC
  Filled 2021-04-28: qty 50

## 2021-04-28 MED ORDER — PHENYLEPHRINE HCL (PRESSORS) 10 MG/ML IV SOLN
INTRAVENOUS | Status: AC
  Filled 2021-04-28: qty 1

## 2021-04-28 MED ORDER — DEXMEDETOMIDINE (PRECEDEX) IN NS 20 MCG/5ML (4 MCG/ML) IV SYRINGE
PREFILLED_SYRINGE | INTRAVENOUS | Status: DC | PRN
  Administered 2021-04-28: 8 ug via INTRAVENOUS

## 2021-04-28 MED ORDER — PHENYLEPHRINE HCL (PRESSORS) 10 MG/ML IV SOLN
INTRAVENOUS | Status: DC | PRN
  Administered 2021-04-28 (×2): 160 ug via INTRAVENOUS

## 2021-04-28 MED ORDER — SODIUM CHLORIDE 0.9 % IV SOLN
INTRAVENOUS | Status: DC
  Administered 2021-04-28: 20 mL/h via INTRAVENOUS

## 2021-04-28 MED ORDER — LIDOCAINE HCL (PF) 2 % IJ SOLN
INTRAMUSCULAR | Status: AC
  Filled 2021-04-28: qty 5

## 2021-04-28 MED ORDER — LIDOCAINE HCL (CARDIAC) PF 100 MG/5ML IV SOSY
PREFILLED_SYRINGE | INTRAVENOUS | Status: DC | PRN
  Administered 2021-04-28: 40 mg via INTRAVENOUS

## 2021-04-28 MED ORDER — PROPOFOL 500 MG/50ML IV EMUL
INTRAVENOUS | Status: DC | PRN
  Administered 2021-04-28: 150 ug/kg/min via INTRAVENOUS

## 2021-04-28 MED ORDER — PROPOFOL 500 MG/50ML IV EMUL
INTRAVENOUS | Status: AC
  Filled 2021-04-28: qty 50

## 2021-04-28 NOTE — Op Note (Signed)
[facility] Gastroenterology Patient Name: Sara Stafford Procedure Date: 04/28/2021 7:35 AM MRN: [id number] Account #: [id number] Date of Birth: June 07, 1945 Admit Type: Outpatient Age: 75 Room: [facility] ENDO ROOM 1 Gender: Female Note Status: Finalized Instrument Name: Peds Colonoscope [id number] Procedure:             Colonoscopy Indications:           Screening for colorectal malignant neoplasm Providers:             Earline Mayotte, MD Referring MD:          Smith Robert, MD (Referring MD) Medicines:             Propofol per Anesthesia Complications:         No immediate complications. Procedure:             Pre-Anesthesia Assessment:                        - Prior to the procedure, a History and Physical was                         performed, and patient medications, allergies and                         sensitivities were reviewed. The patient's tolerance                         of previous anesthesia was reviewed.                        - The risks and benefits of the procedure and the                         sedation options and risks were discussed with the                         patient. All questions were answered and informed                         consent was obtained.                        After obtaining informed consent, the colonoscope was                         passed under direct vision. Throughout the procedure,                         the patient's blood pressure, pulse, and oxygen                         saturations were monitored continuously. The                         Colonoscope was introduced through the anus and                         advanced to the the cecum, identified by appendiceal  orifice and ileocecal valve. The colonoscopy was                         somewhat difficult due to a tortuous colon. The                         patient tolerated the procedure well. The quality of                          the bowel preparation was excellent. Findings:      Three pedunculated polyps were found in the recto-sigmoid colon. The       polyps were 5 to 15 mm in size. These polyps were removed with a hot       snare. The largest in the recto-sigmoid was on a pedicle, the two more       proximal polyps were sessile. The smallest was nearly vaporized with       cautery. Resection and retrieval were complete.      The retroflexed view of the distal rectum and anal verge was normal and       showed no anal or rectal abnormalities. Impression:            - Three 5 to 15 mm polyps at the recto-sigmoid colon,                         removed with a hot snare. Resected and retrieved.                        - The distal rectum and anal verge are normal on                         retroflexion view. Recommendation:        - Telephone endoscopist for pathology results in 1                         week. Diagnosis Code(s):     --- Professional ---                        Z12.11, Encounter for screening for malignant neoplasm                         of colon                        K63.5, Polyp of colon Earline Mayotte, MD 04/28/2021 8:27:11 AM This report has been signed electronically. Number of Addenda: 0 Note Initiated On: 04/28/2021 7:35 AM Scope Withdrawal Time: 0 hours 22 minutes 35 seconds  Total Procedure Duration: 0 hours 32 minutes 43 seconds  Estimated Blood Loss:  Estimated blood loss: none.      [facility]

## 2021-04-28 NOTE — Anesthesia Procedure Notes (Signed)
Date/Time: 04/28/2021 8:36 AM Performed by: Stormy Fabian, CRNA Pre-anesthesia Checklist: Patient identified, Emergency Drugs available, Suction available and Patient being monitored Patient Re-evaluated:Patient Re-evaluated prior to induction Oxygen Delivery Method: Nasal cannula Induction Type: IV induction Dental Injury: Teeth and Oropharynx as per pre-operative assessment  Comments: Nasal cannula with etCO2 monitoring

## 2021-04-28 NOTE — Anesthesia Preprocedure Evaluation (Signed)
Anesthesia Evaluation  Patient identified by MRN, date of birth, ID band Patient awake    Reviewed: Allergy & Precautions, NPO status , Patient's Chart, lab work & pertinent test results  Airway Mallampati: III  TM Distance: <3 FB Neck ROM: full    Dental  (+) Teeth Intact, Caps   Pulmonary neg pulmonary ROS, COPD, former smoker,    Pulmonary exam normal  + decreased breath sounds      Cardiovascular Exercise Tolerance: Good hypertension, Pt. on medications negative cardio ROS Normal cardiovascular exam Rhythm:Regular     Neuro/Psych Anxiety Depression negative neurological ROS  negative psych ROS   GI/Hepatic negative GI ROS, Neg liver ROS, GERD  ,  Endo/Other  negative endocrine ROSdiabetes, Well Controlled, Type 2, Oral Hypoglycemic Agents  Renal/GU negative Renal ROS  negative genitourinary   Musculoskeletal   Abdominal Normal abdominal exam  (+)   Peds negative pediatric ROS (+)  Hematology negative hematology ROS (+) Blood dyscrasia, anemia ,   Anesthesia Other Findings Past Medical History: No date: Arthritis No date: COPD (chronic obstructive pulmonary disease) (HCC)     Comment:  no change with spiriva, poor tolerance of advair No date: Depressive disorder, not elsewhere classified No date: Diabetes mellitus No date: Fatigue No date: Fibromyalgia No date: GERD (gastroesophageal reflux disease) No date: Hyperlipidemia No date: Routine general medical examination at a health care facility  Past Surgical History: No date: ABDOMINAL HYSTERECTOMY     Comment:  partial 02/09/2015: CATARACT EXTRACTION; Left 01/26/2015: CATARACT EXTRACTION; Right  BMI    Body Mass Index: 21.47 kg/m      Reproductive/Obstetrics negative OB ROS                             Anesthesia Physical Anesthesia Plan  ASA: 3  Anesthesia Plan: General   Post-op Pain Management:    Induction:  Intravenous  PONV Risk Score and Plan: Propofol infusion and TIVA  Airway Management Planned: Nasal Cannula  Additional Equipment:   Intra-op Plan:   Post-operative Plan:   Informed Consent: I have reviewed the patients History and Physical, chart, labs and discussed the procedure including the risks, benefits and alternatives for the proposed anesthesia with the patient or authorized representative who has indicated his/her understanding and acceptance.     Dental Advisory Given  Plan Discussed with: CRNA and Surgeon  Anesthesia Plan Comments:         Anesthesia Quick Evaluation

## 2021-04-28 NOTE — Anesthesia Postprocedure Evaluation (Signed)
Anesthesia Post Note  Patient: Sara Stafford  Procedure(s) Performed: ESOPHAGOGASTRODUODENOSCOPY (EGD) WITH PROPOFOL COLONOSCOPY WITH PROPOFOL  Patient location during evaluation: PACU Anesthesia Type: General Level of consciousness: awake and awake and alert Pain management: satisfactory to patient Vital Signs Assessment: post-procedure vital signs reviewed and stable Respiratory status: spontaneous breathing and nonlabored ventilation Cardiovascular status: blood pressure returned to baseline Anesthetic complications: no   No notable events documented.   Last Vitals:  Vitals:   04/28/21 0847 04/28/21 0857  BP: 129/71 (!) 130/57  Pulse: 84 80  Resp: (!) 26 20  Temp:    SpO2: 94% 100%    Last Pain:  Vitals:   04/28/21 0857  TempSrc:   PainSc: 0-No pain                 VAN STAVEREN,Briea Mcenery

## 2021-04-28 NOTE — Transfer of Care (Signed)
Immediate Anesthesia Transfer of Care Note  Patient: Sara Stafford  Procedure(s) Performed: Procedure(s): ESOPHAGOGASTRODUODENOSCOPY (EGD) WITH PROPOFOL (N/A) COLONOSCOPY WITH PROPOFOL (N/A)  Patient Location: PACU and Endoscopy Unit  Anesthesia Type:General  Level of Consciousness: sedated  Airway & Oxygen Therapy: Patient Spontanous Breathing and Patient connected to nasal cannula oxygen  Post-op Assessment: Report given to RN and Post -op Vital signs reviewed and stable  Post vital signs: Reviewed and stable  Last Vitals:  Vitals:   04/28/21 0827 04/28/21 0837  BP: 112/72 106/68  Pulse: 86 81  Resp: (!) 22 (!) 21  Temp: (!) 36.2 C   SpO2: 100% 97%    Complications: No apparent anesthesia complications

## 2021-04-28 NOTE — H&P (Addendum)
Sara Stafford [id number] March 14, 1946     HPI:  Abdominal pain not clearly related to meals. Known cholelithiasis with non-functioning gallbladder on HIDA scan. Newly identified normo-chromic anemia.  For upper and lower endoscopy. Tolerated the prep well.   Medications Prior to Admission  Medication Sig Dispense Refill Last Dose   acetaminophen (TYLENOL) 325 MG tablet Take 650 mg by mouth daily.   Past Week   aspirin 81 MG tablet Take 81 mg by mouth as needed.   Past Week   azelastine (ASTELIN) 0.1 % nasal spray Place into both nostrils as needed for rhinitis. Use in each nostril as directed   04/27/2021   Blood Glucose Monitoring Suppl (TRUE METRIX METER) w/Device KIT    04/27/2021   cetirizine (ZYRTEC) 10 MG tablet Take 10 mg by mouth daily.    04/27/2021   clonazePAM (KLONOPIN) 0.5 MG tablet Take 0.5 mg by mouth 2 (two) times daily as needed.   04/27/2021   diclofenac sodium (VOLTAREN) 1 % GEL    04/27/2021   furosemide (LASIX) 20 MG tablet Take 20 mg by mouth daily.   04/27/2021   glipiZIDE (GLUCOTROL XL) 10 MG 24 hr tablet TAKE (2) TABLETS BY MOUTH ONCE DAILY. 60 tablet 0 04/27/2021   JANUVIA 50 MG tablet Take 50 mg by mouth daily.   04/27/2021   lisinopril (ZESTRIL) 10 MG tablet Take 10 mg by mouth daily.   04/27/2021   metFORMIN (GLUCOPHAGE) 500 MG tablet Take 1 tablet by mouth 2 (two) times daily.   04/27/2021   metoprolol tartrate (LOPRESSOR) 25 MG tablet Take 25 mg by mouth 2 (two) times daily.   04/27/2021   montelukast (SINGULAIR) 10 MG tablet TAKE ONE TABLET BY MOUTH AT BEDTIME. 30 tablet 5 04/27/2021   omeprazole (PRILOSEC) 20 MG capsule TAKE (1) CAPSULE BY MOUTH ONCE DAILY AS NEEDED. 30 capsule 5 04/27/2021   pregabalin (LYRICA) 100 MG capsule Take 100 mg by mouth at bedtime.   04/27/2021   rosuvastatin (CRESTOR) 20 MG tablet Take 20 mg by mouth at bedtime.   04/27/2021   TRUEplus Lancets 30G MISC    04/27/2021   VENTOLIN HFA 108 (90 Base) MCG/ACT inhaler INHALE 2 PUFFS  BY MOUTH EVERY 4 HOURS AS NEEDED 18 each 5 04/28/2021 at 0600   [place] 250-50 MCG/DOSE AEPB TAKE 1 PUFF BY MOUTH TWICE A DAY   04/27/2021   venlafaxine (EFFEXOR) 37.5 MG tablet TAKE 1 TABLET BY MOUTH TWICE DAILY 60 tablet 0    Allergies  Allergen Reactions   Doxycycline     REACTION: thrush   Past Medical History:  Diagnosis Date   Arthritis    COPD (chronic obstructive pulmonary disease) (HCC)    no change with spiriva, poor tolerance of advair   Depressive disorder, not elsewhere classified    Diabetes mellitus    Fatigue    Fibromyalgia    GERD (gastroesophageal reflux disease)    Hyperlipidemia    Routine general medical examination at a health care facility    Past Surgical History:  Procedure Laterality Date   ABDOMINAL HYSTERECTOMY     partial   CATARACT EXTRACTION Left 02/09/2015   CATARACT EXTRACTION Right 01/26/2015   Social History   Socioeconomic History   Marital status: Married    Spouse name: Not on file   Number of children: 2   Years of education: Not on file   Highest education level: Not on file  Occupational History   Occupation: retired  Employer: RETIRED    Comment: [employer]  Tobacco Use   Smoking status: Former    Packs/day: 1.00    Years: 50.00    Pack years: 50.00    Types: Cigarettes    Quit date: 06/06/2010    Years since quitting: 10.9   Smokeless tobacco: Never  Substance and Sexual Activity   Alcohol use: No    Alcohol/week: 0.0 standard drinks   Drug use: No   Sexual activity: Never  Other Topics Concern   Not on file  Social History Narrative   Children aged 12, and 33, expecting 2nd grandchild   Social Determinants of Radio broadcast assistant Strain: Not on file  Food Insecurity: Not on file  Transportation Needs: Not on file  Physical Activity: Not on file  Stress: Not on file  Social Connections: Not on file  Intimate Partner Violence: Not on file   Social History   Social History Narrative    Children aged 45, and 35, expecting 2nd grandchild   Hemacult testing negative.   ROS: Negative.     PE: HEENT: Negative. Lungs: Clear. Cardio: RR.   Assessment/Plan:  Proceed with planned upper and lower endoscopy.   [name] Joely Losier 04/28/2021

## 2021-04-28 NOTE — Op Note (Signed)
[facility] Gastroenterology Patient Name: Sara Stafford Procedure Date: 04/28/2021 7:35 AM MRN: [id number] Account #: [id number] Date of Birth: 03-Jan-1946 Admit Type: Outpatient Age: 75 Room: [facility] ENDO ROOM 1 Gender: Female Note Status: Finalized Instrument Name: Upper Endoscope [id number] Procedure:             Upper GI endoscopy Indications:           Abdominal pain in the right upper quadrant Providers:             Earline Mayotte, MD Referring MD:          Smith Robert, MD (Referring MD) Medicines:             Propofol per Anesthesia Complications:         No immediate complications. Procedure:             Pre-Anesthesia Assessment:                        - Prior to the procedure, a History and Physical was                         performed, and patient medications, allergies and                         sensitivities were reviewed. The patient's tolerance                         of previous anesthesia was reviewed.                        - The risks and benefits of the procedure and the                         sedation options and risks were discussed with the                         patient. All questions were answered and informed                         consent was obtained.                        After obtaining informed consent, the endoscope was                         passed under direct vision. Throughout the procedure,                         the patient's blood pressure, pulse, and oxygen                         saturations were monitored continuously. The Endoscope                         was introduced through the mouth, and advanced to the                         third part of duodenum. The upper GI endoscopy was  accomplished without difficulty. The patient tolerated                         the procedure well. Findings:      The esophagus was normal.      Multiple 5 to 9 mm pedunculated and sessile polyps with no  bleeding and       no stigmata of recent bleeding were found in the gastric fundus.      Localized minimal inflammation was found in the gastric body.      The examined duodenum was normal. Impression:            - Normal esophagus.                        - Multiple gastric polyps.                        - Chronic gastritis.                        - Normal examined duodenum.                        - No specimens collected. Recommendation:        - Perform a colonoscopy today. Procedure Code(s):     --- Professional ---                        [id number], Esophagogastroduodenoscopy, flexible,                         transoral; diagnostic, including collection of                         specimen(s) by brushing or washing, when performed                         (separate procedure) Diagnosis Code(s):     --- Professional ---                        K31.7, Polyp of stomach and duodenum                        K29.50, Unspecified chronic gastritis without bleeding                        R10.11, Right upper quadrant pain CPT copyright 2019 American Medical Association. All rights reserved. The codes documented in this report are preliminary and upon coder review may  be revised to meet current compliance requirements. Earline Mayotte, MD 04/28/2021 7:48:03 AM This report has been signed electronically. Number of Addenda: 0 Note Initiated On: 04/28/2021 7:35 AM Estimated Blood Loss:  Estimated blood loss: none.      [facility]

## 2021-04-29 ENCOUNTER — Encounter: Payer: Self-pay | Admitting: General Surgery

## 2021-04-29 LAB — SURGICAL PATHOLOGY

## 2022-01-21 ENCOUNTER — Emergency Department
Admission: EM | Admit: 2022-01-21 | Discharge: 2022-01-21 | Disposition: A | Discharge status: Home / Self Care | Payer: Medicare Other | Attending: Emergency Medicine | Admitting: Emergency Medicine

## 2022-01-21 ENCOUNTER — Other Ambulatory Visit: Payer: Self-pay

## 2022-01-21 ENCOUNTER — Emergency Department: Payer: Medicare Other

## 2022-01-21 ENCOUNTER — Encounter: Payer: Self-pay | Admitting: Emergency Medicine

## 2022-01-21 DIAGNOSIS — M545 Low back pain, unspecified: Secondary | ICD-10-CM | POA: Insufficient documentation

## 2022-01-21 DIAGNOSIS — R109 Unspecified abdominal pain: Secondary | ICD-10-CM | POA: Insufficient documentation

## 2022-01-21 DIAGNOSIS — E119 Type 2 diabetes mellitus without complications: Secondary | ICD-10-CM | POA: Diagnosis not present

## 2022-01-21 DIAGNOSIS — J449 Chronic obstructive pulmonary disease, unspecified: Secondary | ICD-10-CM | POA: Diagnosis not present

## 2022-01-21 LAB — COMPREHENSIVE METABOLIC PANEL
ALT: 13 U/L (ref 0–44)
AST: 22 U/L (ref 15–41)
Albumin: 4.3 g/dL (ref 3.5–5.0)
Alkaline Phosphatase: 70 U/L (ref 38–126)
Anion gap: 11 (ref 5–15)
BUN: 18 mg/dL (ref 8–23)
CO2: 27 mmol/L (ref 22–32)
Calcium: 9.7 mg/dL (ref 8.9–10.3)
Chloride: 100 mmol/L (ref 98–111)
Creatinine, Ser: 0.98 mg/dL (ref 0.44–1.00)
GFR, Estimated: 59 mL/min — ABNORMAL LOW (ref >60)
Glucose, Bld: 224 mg/dL — ABNORMAL HIGH (ref 70–99)
Potassium: 4.3 mmol/L (ref 3.5–5.1)
Sodium: 138 mmol/L (ref 135–145)
Total Bilirubin: 0.5 mg/dL (ref 0.3–1.2)
Total Protein: 7.1 g/dL (ref 6.5–8.1)

## 2022-01-21 LAB — URINALYSIS, ROUTINE W REFLEX MICROSCOPIC
Bilirubin Urine: NEGATIVE
Glucose, UA: NEGATIVE mg/dL
Hgb urine dipstick: NEGATIVE
Ketones, ur: NEGATIVE mg/dL
Leukocytes,Ua: NEGATIVE
Nitrite: NEGATIVE
Protein, ur: NEGATIVE mg/dL
Specific Gravity, Urine: 1.011 (ref 1.005–1.030)
pH: 5 (ref 5.0–8.0)

## 2022-01-21 LAB — CBC WITH DIFFERENTIAL/PLATELET
Abs Immature Granulocytes: 0.01 10*3/uL (ref 0.00–0.07)
Basophils Absolute: 0.1 10*3/uL (ref 0.0–0.1)
Basophils Relative: 1 %
Eosinophils Absolute: 0 10*3/uL (ref 0.0–0.5)
Eosinophils Relative: 1 %
HCT: 39.1 % (ref 36.0–46.0)
Hemoglobin: 12.4 g/dL (ref 12.0–15.0)
Immature Granulocytes: 0 %
Lymphocytes Relative: 20 %
Lymphs Abs: 1.2 10*3/uL (ref 0.7–4.0)
MCH: 30.2 pg (ref 26.0–34.0)
MCHC: 31.7 g/dL (ref 30.0–36.0)
MCV: 95.4 fL (ref 80.0–100.0)
Monocytes Absolute: 0.5 10*3/uL (ref 0.1–1.0)
Monocytes Relative: 8 %
Neutro Abs: 4.3 10*3/uL (ref 1.7–7.7)
Neutrophils Relative %: 70 %
Platelets: 284 10*3/uL (ref 150–400)
RBC: 4.1 MIL/uL (ref 3.87–5.11)
RDW: 11.9 % (ref 11.5–15.5)
WBC: 6 10*3/uL (ref 4.0–10.5)
nRBC: 0 % (ref 0.0–0.2)

## 2022-01-21 LAB — LIPASE, BLOOD: Lipase: 57 U/L — ABNORMAL HIGH (ref 11–51)

## 2022-01-21 MED ORDER — IOHEXOL 300 MG/ML  SOLN
80.0000 mL | Freq: Once | INTRAMUSCULAR | Status: AC | PRN
Start: 2022-01-21 — End: 2022-01-21
  Administered 2022-01-21: 80 mL via INTRAVENOUS

## 2022-01-21 MED ORDER — LIDOCAINE 5 % EX PTCH: 1.0000 | MEDICATED_PATCH | Freq: Two times a day (BID) | CUTANEOUS | 0 refills | Status: AC

## 2022-01-21 MED ORDER — LIDOCAINE 5 % EX PTCH
1.0000 | MEDICATED_PATCH | CUTANEOUS | Status: DC
  Administered 2022-01-21: 1 via TRANSDERMAL
  Filled 2022-01-21: qty 1

## 2022-01-21 MED ORDER — ACETAMINOPHEN 325 MG PO TABS
650.0000 mg | ORAL_TABLET | Freq: Once | ORAL | Status: AC
  Administered 2022-01-21: 650 mg via ORAL
  Filled 2022-01-21: qty 2

## 2022-01-21 NOTE — ED Provider Notes (Signed)
[facility] Provider Note    Event Date/Time   First MD Initiated Contact with Patient 01/21/22 [phone]     (approximate)   History   Back Pain   HPI  Sara Stafford is a 76 y.o. female with a past medical history of generalized anxiety disorder, COPD, diabetes, hyperlipidemia, fibromyalgia who presents today for evaluation of back pain.  Patient reports that she has had back pain for about a week and went to see her primary care doctor about it on Monday who started her on gabapentin.  Patient also notes that she has pain underneath her left breast which she has had for approximately 2 years ever since she had shingles.  She does not feel that it is any different today.  She feels that the gabapentin is helping for this pain.  She denies any radiation of pain into her abdomen or into her legs.  She denies any urinary or fecal incontinence or retention.  She is still able to ambulate without difficulty.  I reviewed the patient's chart.  Patient was seen by her PCP on 01/18/2022 for evaluation of lumbar pain  Patient Active Problem List   Diagnosis Date Noted   GAD (generalized anxiety disorder) 05/31/2017   Vitamin D deficiency 08/03/2016   Well woman exam 07/29/2016   Osteoporosis 10/05/2015   Macular degeneration 06/02/2014   MAMMOGRAM, ABNORMAL, LEFT 09/10/2009   Pulmonary nodules 03/20/2009   ANEMIAS DUE TO DISORDERS GLUTATHIONE METABOLISM 07/31/2008   COPD GOLD II  10/11/2007   Diabetes (HCC) 06/04/2007   HYPERLIPIDEMIA 06/04/2007   Depression 06/04/2007   GERD 06/04/2007   FIBROMYALGIA 06/04/2007          Physical Exam   Triage Vital Signs: ED Triage Vitals [01/21/22 0758]  Enc Vitals Group     BP      Pulse      Resp      Temp      Temp src      SpO2      Weight 129 lb (58.5 kg)     Height 5\' 5"  (1.651 m)     Head Circumference      Peak Flow      Pain Score      Pain Loc      Pain Edu?      Excl. in GC?     Most recent  vital signs: Vitals:   01/21/22 0803 01/21/22 1058  BP: (!) 176/88 (!) 170/80  Pulse: 87 80  Resp: 18 18  Temp: 98 F (36.7 C)   SpO2: 97% 98%    Physical Exam Vitals and nursing note reviewed.  Constitutional:      General: Awake and alert. No acute distress.    Appearance: Normal appearance. The patient is normal weight.  HENT:     Head: Normocephalic and atraumatic.     Mouth: Mucous membranes are moist.  Eyes:     General: PERRL. Normal EOMs        Right eye: No discharge.        Left eye: No discharge.     Conjunctiva/sclera: Conjunctivae normal.  Cardiovascular:     Rate and Rhythm: Normal rate and regular rhythm.     Pulses: Normal pulses.     Heart sounds: Normal heart sounds Pulmonary:     Effort: Pulmonary effort is normal. No respiratory distress.     Breath sounds: Normal breath sounds.  Abdominal:     Abdomen is soft.  There is no abdominal tenderness. No rebound or guarding. No distention.  Dry skin noted to left under breast region no erythema Musculoskeletal:        General: No swelling. Normal range of motion.     Cervical back: Normal range of motion and neck supple. Back: No midline tenderness.  Tenderness to right greater than left lumbar paraspinal area with palpable muscle spasm.  No overlying skin color changes.  Strength and sensation 5/5 to bilateral lower extremities. Normal great toe extension against resistance. Normal sensation throughout feet. Normal patellar reflexes. Negative SLR and opposite SLR bilaterally. Negative FABER test  Skin:    General: Skin is warm and dry.     Capillary Refill: Capillary refill takes less than 2 seconds.     Findings: No rash.  Neurological:     Mental Status: The patient is awake and alert.      ED Results / Procedures / Treatments   Labs (all labs ordered are listed, but only abnormal results are displayed) Labs Reviewed  URINALYSIS, ROUTINE W REFLEX MICROSCOPIC - Abnormal; Notable for the following  components:      Result Value   Color, Urine YELLOW (*)    APPearance HAZY (*)    All other components within normal limits  COMPREHENSIVE METABOLIC PANEL - Abnormal; Notable for the following components:   Glucose, Bld 224 (*)    GFR, Estimated 59 (*)    All other components within normal limits  LIPASE, BLOOD - Abnormal; Notable for the following components:   Lipase 57 (*)    All other components within normal limits  CBC WITH DIFFERENTIAL/PLATELET     EKG     RADIOLOGY I independently reviewed and interpreted imaging and agree with radiologists findings.     PROCEDURES:  Critical Care performed:   Procedures   MEDICATIONS ORDERED IN ED: Medications  lidocaine (LIDODERM) 5 % 1 patch (1 patch Transdermal Patch Applied 01/21/22 0841)  acetaminophen (TYLENOL) tablet 650 mg (650 mg Oral Given 01/21/22 0841)  iohexol (OMNIPAQUE) 300 MG/ML solution 80 mL (80 mLs Intravenous Contrast Given 01/21/22 1004)     IMPRESSION / MDM / ASSESSMENT AND PLAN / ED COURSE  I reviewed the triage vital signs and the nursing notes.   Differential diagnosis includes, but is not limited to, low back pain, degenerative changes, musculoskeletal injury, osseous abnormality, lumbar radiculopathy, vascular etiology, intra-abdominal abnormality.  This is a 76 year old female with a history of back pain who presents with back pain. 5 out of 5 strength with intact sensation to extensor hallucis dorsiflexion and plantarflexion of bilateral lower extremities with normal patellar reflexes bilaterally.  She is ambulatory with a steady and normal gait.  Most likely etiology at this point is muscle strain vs spasm. No red flags to indicate patient is at risk for more auspicious process. No major trauma, no midline tenderness, no history or physical exam findings to suggest cauda equina syndrome or spinal cord compression. No focal neurological deficits on exam. No constitutional symptoms or history of  immunosuppression or IVDA to suggest potential for epidural abscess. Not anticoagulated, no history of bleeding diastasis to suggest risk for epidural hematoma. No abdominal pain or flank pain to suggest kidney stone, no history of kidney stone.  No fever or dysuria or CVAT to suggest pyelonephritis .  No chest pain, back pain, shortness of breath, neurological deficits, to suggest vascular catastrophe, and pulses are equal in all 4 extremities.  However, given her age, labs and CT scan  were obtained which are overall reassuring.  She was noted to be constipated, she reports that her last normal bowel movement was yesterday and she plans on using the bathroom soon as she gets home.  She denies disimpaction.  Patient had significant improvement of her symptoms with Tylenol and Lidoderm patch, upon reevaluation reports that her pain is completely resolved and she is requesting Lidoderm patches to use at home.  These were prescribed to her.  Discussed care instructions and return precautions with patient. Recommended close outpatient follow-up for re-evaluation. Patient agrees with plan of care. Will treat the patient symptomatically as needed for pain control. Will discharge patient to take these medications and return for any worsening or different pain or development of any neurologic symptoms. Educated patient regarding expected time course for back pain to improve and recommended very close outpatient follow-up.  Patient understands and agrees with plan.  She has had multiple injections in her lumbar spine with Dr. Sabra Heck and plans to follow-up with him in the near future.  She was discharged with her niece in stable condition.   Patient's presentation is most consistent with acute complicated illness / injury requiring diagnostic workup.      FINAL CLINICAL IMPRESSION(S) / ED DIAGNOSES   Final diagnoses:  Acute bilateral low back pain without sciatica     Rx / DC Orders   ED Discharge Orders           Ordered    lidocaine (LIDODERM) 5 %  Every 12 hours        01/21/22 1051             Note:  This document was prepared using Dragon voice recognition software and may include unintentional dictation errors.   Emeline Gins 01/21/22 1139    Harvest Dark, MD 01/21/22 [phone]

## 2022-01-21 NOTE — Discharge Instructions (Signed)
Please follow up with your outpatient provider for your spinal injections as you have scheduled.  You may use the Lidoderm patches as prescribed in addition to taking Tylenol 650 mg every 6-8 hours as needed for pain.  Please return for any new, worsening, or change in symptoms or other concerns.  It was a pleasure caring for you today.

## 2022-01-21 NOTE — ED Notes (Signed)
See triage note  Presents with lower back pain   States pain is across lower back and into abd  Denies any fever or injury  ambulates well

## 2022-01-21 NOTE — ED Triage Notes (Signed)
Pt to ED via POV c/o back pain. Pt states that she has been up all night with the pain. Pt states that the pain is in her lower back and extends all the way across her lower back. Pt also c/o pain under her left breast, pt describes pain as "nerve pain". Pt is ambulatory and in NAD.

## 2022-02-01 ENCOUNTER — Other Ambulatory Visit: Payer: Self-pay | Admitting: General Surgery

## 2022-02-01 NOTE — Progress Notes (Signed)
Progress Notes - documented in this encounter Sara Stafford, [name], MD - 02/01/2022 10:15 AM EDT Formatting of this note is different from the original. Subjective:   Patient ID: Sara Stafford is a 76 y.o. female.  HPI  The following portions of the patient's history were reviewed and updated as appropriate.  This an established patient is here today for: office visit. The patient is here today for re-evaluation of her gallbladder. Patient had called and spoken with Dr. Bary Castilla over the weekend due to pain. She has been having right upper quadrant pain but seems to be getting worse over the past 2 weeks. Patient reports she ate gravy Sunday night and thinks that may have triggered the pain. She denies any other food triggers.   The patient had called on Labor Day evening, reporting the same abdominal pain. She failed to mention at that time she had been seen in the ED on January 21, 2022. When questioned today about "what was different" she was somewhat vague, only that the discomfort on the right side of the abdomen was more pronounced. No fever, chills. No vomiting.  Patient admits to a 10 pound weight loss over the last several months. She has been eating different because her hemoglobin A1C is elevated. Patient has a new meter and will start checking her blood sugars.   She is here with her sister, Sara Stafford.  Chief Complaint  Patient presents with  Follow-up    Today: BP (!) 158/78  Pulse 85  Temp 36.4 C (97.6 F)  Ht 165.1 cm (5\' 5" )  Wt 56.2 kg (124 lb)  SpO2 95%  BMI 20.63 kg/m   December 14, 2021: BP 124/70  Pulse 88  Wt 58.4 kg (128 lb 12.8 oz)   Past Medical History:  Diagnosis Date  CKD (chronic kidney disease)  COPD (chronic obstructive pulmonary disease) (CMS-HCC)  Depressive disorder  Diabetes (CMS-HCC)  DM type 2 with diabetic mixed hyperlipidemia (CMS-HCC) 06/09/2021  Fatigue  Fibromyalgia  GERD (gastroesophageal reflux disease)   Hyperlipidemia  Hypertension  Macular degeneration  Osteoarthritis  Sciatica    Past Surgical History:  Procedure Laterality Date  CATARACT EXTRACTION Right 01/26/2015  CATARACT EXTRACTION Left 02/09/2015  EGD 04/28/2021  Gastritis/Multiple gastric polyps/Repeat as needed, based on clinical needs only/[facility]  COLONOSCOPY 04/28/2021  Hyperplastic polyp/Repeat as needed, based on clinical needs only/[facility]  ABDOMINAL HYSTERECTOMY    OB History   Gravida  2  Para  2  Term   Preterm   AB   Living     SAB   IAB   Ectopic   Molar   Multiple   Live Births     Obstetric Comments  Age at first period 63 Age of first pregnancy 20      Social History   Socioeconomic History  Marital status: Married  Tobacco Use  Smoking status: Former  Packs/day: 1.00  Years: 50.00  Pack years: 50.00  Types: Cigarettes  Quit date: 06/06/2010  Years since quitting: 11.6  Smokeless tobacco: Never  Vaping Use  Vaping Use: Never used  Substance and Sexual Activity  Alcohol use: Not Currently  Drug use: Never  Sexual activity: Defer    Allergies  Allergen Reactions  Codeine Vomiting  Doxycycline Other (See Comments)  thrush   Current Outpatient Medications  Medication Sig Dispense Refill  ADVAIR DISKUS 250-50 mcg/dose diskus inhaler Inhale 1 inhalation into the lungs 2 (two) times daily 1 each 11  albuterol 90 mcg/actuation inhaler Inhale 2 inhalations into  the lungs every 4 (four) hours as needed 18 g 11  ALPRAZolam (XANAX) 0.25 MG tablet Take 1 tablet (0.25 mg total) by mouth at bedtime as needed for Sleep 30 tablet 5  azelastine (ASTELIN) 137 mcg nasal spray as needed  diclofenac (VOLTAREN) 75 MG EC tablet Take 1 tablet (75 mg total) by mouth daily with breakfast 30 tablet 1  gabapentin (NEURONTIN) 100 MG capsule Take 1 capsule (100 mg total) by mouth 2 (two) times daily 90 capsule 3  glipiZIDE (GLUCOTROL XL) 10 MG XL tablet Take 1 tablet (10 mg total) by mouth  2 (two) times daily 180 tablet 3  ketoconazole (NIZORAL) 2 % cream 1 application  metFORMIN (GLUCOPHAGE-XR) 750 MG XR tablet Take 1 tablet (750 mg total) by mouth daily with dinner 30 tablet 11  omeprazole (PRILOSEC) 20 MG DR capsule Take 1 capsule (20 mg total) by mouth once daily 90 capsule 3  rosuvastatin (CRESTOR) 20 MG tablet Take 1 tablet (20 mg total) by mouth once daily 90 tablet 3  silver sulfADIAZINE (SSD) 1 % cream Apply topically 2 (two) times daily 50 g 1  triamcinolone 0.1 % lotion Apply topically 3 (three) times daily 60 mL 0  venlafaxine (EFFEXOR) 37.5 MG tablet Take 1 tablet (37.5 mg total) by mouth once daily (Patient taking differently: Take 37.5 mg by mouth once daily 1/4 tab daily) 90 tablet 3  vit C/E/cuperic/zinc/lutein (PRESERVISION LUTEIN ORAL) Take by mouth 2 (two) times daily   No current facility-administered medications for this visit.   Family History  Problem Relation Age of Onset  Asthma Father  Diabetes Father  Stroke Father  Liver cancer Sister  Pancreatic cancer Sister  Brain cancer Brother  Diabetes Paternal Grandmother  Breast cancer Maternal Aunt   Labs and Radiology:   ED records of January 21, 2022 reviewed:  Normal sinus rhythm Septal infarct (cited on or before 14-Jan-2021) Abnormal ECG When compared with ECG of 14-Jan-2021 10:10, Questionable change in initial forces of Septal leads Confirmed by UNCONFIRMED, DOCTOR ([id number]), editor Kearney Hard ([id number]) on 01/21/2022 8:46:19 AM  Lipase 11 - 51 U/L 57 High  Sodium 135 - 145 mmol/L 138  Potassium 3.5 - 5.1 mmol/L 4.3  Chloride 98 - 111 mmol/L 100  CO2 22 - 32 mmol/L 27  Glucose, Bld 70 - 99 mg/dL 631 High  Comment: Glucose reference range applies only to samples taken after fasting for at least 8 hours. BUN 8 - 23 mg/dL 18  Creatinine, Ser 4.97 - 1.00 mg/dL 0.26  Calcium 8.9 - 37.8 mg/dL 9.7  Total Protein 6.5 - 8.1 g/dL 7.1  Albumin 3.5 - 5.0 g/dL 4.3  AST 15 - 41 U/L 22  ALT 0 - 44  U/L 13  Alkaline Phosphatase 38 - 126 U/L 70  Total Bilirubin 0.3 - 1.2 mg/dL 0.5  GFR, Estimated >58 mL/min 59 Low  WBC 4.0 - 10.5 K/uL 6.0  RBC 3.87 - 5.11 MIL/uL 4.10  Hemoglobin 12.0 - 15.0 g/dL 85.0  HCT 27.7 - 41.2 % 39.1  MCV 80.0 - 100.0 fL 95.4  MCH 26.0 - 34.0 pg 30.2  MCHC 30.0 - 36.0 g/dL 87.8  RDW 67.6 - 72.0 % 11.9  Platelets 150 - 400 K/uL 284  nRBC 0.0 - 0.2 % 0.0  Neutrophils Relative % % 70  Neutro Abs 1.7 - 7.7 K/uL 4.3  Lymphocytes Relative % 20  Lymphs Abs 0.7 - 4.0 K/uL 1.2  Monocytes Relative % 8  Monocytes Absolute 0.1 - 1.0  K/uL 0.5  Eosinophils Relative % 1  Eosinophils Absolute 0.0 - 0.5 K/uL 0.0  Basophils Relative % 1  Basophils Absolute 0.0 - 0.1 K/uL 0.1  Immature Granulocytes % 0  Abs Immature Granulocytes 0.00 - 0.07 K/uL 0.01   CT of the abdomen for renal stones January 14, 2021 showed calcified gallstones. HIDA scan on April 06, 2021 showed a nonfunctioning gallbladder.  January 21, 2022 CT:  IMPRESSION: 1. No acute abdominopelvic findings. 2. Cholelithiasis within a moderately dilated gallbladder. No pericholecystic inflammatory changes by CT. If there is clinical suspicion for cholecystitis, recommend ultrasound. 3. Prominent rectal stool ball measuring 8 cm in diameter. 4. Colonic diverticulosis without evidence of acute diverticulitis. 5. Aortic atherosclerosis (ICD10-I70.0).  Review of Systems  Constitutional: Negative for chills and fever.  Respiratory: Negative for cough.    Objective:  Physical Exam Exam conducted with a chaperone present.  Constitutional:  Appearance: Normal appearance.  Cardiovascular:  Rate and Rhythm: Normal rate and regular rhythm.  Pulses: Normal pulses.  Heart sounds: Normal heart sounds.  Pulmonary:  Effort: Pulmonary effort is normal.  Breath sounds: Normal breath sounds.  Abdominal:  General: Abdomen is flat. Bowel sounds are normal.  Palpations: Abdomen is soft.  Tenderness: There is  abdominal tenderness in the right upper quadrant.  Comments: Very mild discomfort with firm pressure in the RUQ. No palpable enlargement of the liver or gallbladder.  Musculoskeletal:  Cervical back: Neck supple.  Skin: General: Skin is warm and dry.  Neurological:  Mental Status: She is alert and oriented to person, place, and time.  Psychiatric:  Mood and Affect: Mood normal.  Behavior: Behavior normal.    Assessment:   Longstanding right sided upper abdominal pain, longstanding cholelithiasis.  Weight loss, likely related to food avoidance after elevated hemoglobin A1c July 2023.  Diabetes, reasonable control until last checkup.  Plan:   Considering all the pros and cons, with the patient's diabetes being somewhat out of kilter, and the possibility of low-grade cholecystitis, I think it is reasonable to take the risks associated with elective cholecystectomy. With a nonfunctioning gallbladder and diabetes she is at slightly higher risk for acute cholecystitis, and this could be avoided with elective cholecystectomy.  Risks associated with surgery including bleeding, injury to adjacent organs and the possibility of an open procedure were reviewed.  Her sister asked that the patient be kept overnight, and unless there is a medical indication, she will be a candidate for discharge the same day of surgery, which is the patient desire.  Surgery is scheduled for Friday, September 8.  This note is partially prepared by Wendall Stade, CMA acting as a scribe in the presence of Dr. Donnalee Curry, MD.   The documentation recorded by the scribe accurately reflects the service I personally performed and the decisions made by me.   Earline Mayotte, MD FACS'   Electronically signed by Rosezella Florida, MD at 02/01/2022 1:13 PM EDT

## 2022-02-03 ENCOUNTER — Inpatient Hospital Stay: Admission: RE | Admit: 2022-02-03 | Payer: Medicare Other | Source: Ambulatory Visit

## 2022-02-04 ENCOUNTER — Encounter: Admission: RE | Payer: Self-pay | Source: Ambulatory Visit

## 2022-02-04 ENCOUNTER — Ambulatory Visit: Admission: RE | Admit: 2022-02-04 | Payer: Medicare Other | Source: Ambulatory Visit | Admitting: General Surgery

## 2022-02-04 SURGERY — LAPAROSCOPIC CHOLECYSTECTOMY WITH INTRAOPERATIVE CHOLANGIOGRAM
Anesthesia: General

## 2022-02-10 ENCOUNTER — Encounter
Admission: RE | Admit: 2022-02-10 | Discharge: 2022-02-10 | Disposition: A | Discharge status: Home / Self Care | Payer: Medicare Other | Source: Ambulatory Visit | Attending: General Surgery | Admitting: General Surgery

## 2022-02-10 VITALS — Ht 65.0 in | Wt 124.0 lb

## 2022-02-10 DIAGNOSIS — E0800 Diabetes mellitus due to underlying condition with hyperosmolarity without nonketotic hyperglycemic-hyperosmolar coma (NKHHC): Secondary | ICD-10-CM

## 2022-02-10 HISTORY — DX: Essential (primary) hypertension: I10

## 2022-02-10 HISTORY — DX: Unspecified macular degeneration: H35.30

## 2022-02-10 NOTE — Patient Instructions (Signed)
Your procedure is scheduled on: 02/16/22 Report to DAY SURGERY DEPARTMENT LOCATED ON 2ND FLOOR MEDICAL MALL ENTRANCE. To find out your arrival time please call [phone] between 1PM - 3PM on 02/15/22.  Remember: Instructions that are not followed completely may result in serious medical risk, up to and including death, or upon the discretion of your surgeon and anesthesiologist your surgery may need to be rescheduled.     _X__ 1. Do not eat food after midnight the night before your procedure.                 No gum chewing or hard candies. You may drink clear liquids up to 2 hours                 before you are scheduled to arrive for your surgery- DO not drink clear                 liquids within 2 hours of the start of your surgery.                 Clear Liquids include:  water, apple juice without pulp, clear carbohydrate                 drink such as Clearfast or Gatorade, Black Coffee or Tea (Do not add                 anything to coffee or tea). Diabetics water only  __X__2.  On the morning of surgery brush your teeth with toothpaste and water, you                 may rinse your mouth with mouthwash if you wish.  Do not swallow any              toothpaste of mouthwash.     _X__ 3.  No Alcohol for 24 hours before or after surgery.   _X__ 4.  Do Not Smoke or use e-cigarettes For 24 Hours Prior to Your Surgery.                 Do not use any chewable tobacco products for at least 6 hours prior to                 surgery.  ____  5.  Bring all medications with you on the day of surgery if instructed.   __X__  6.  Notify your doctor if there is any change in your medical condition      (cold, fever, infections).     Do not wear jewelry, make-up, hairpins, clips or nail polish. Do not wear lotions, powders, or perfumes.  Do not shave body hair 48 hours prior to surgery. Men may shave face and neck. Do not bring valuables to the hospital.    [facility] is not responsible for any  belongings or valuables.  Contacts, dentures/partials or body piercings may not be worn into surgery. Bring a case for your contacts, glasses or hearing aids, a denture cup will be supplied. Leave your suitcase in the car. After surgery it may be brought to your room. For patients admitted to the hospital, discharge time is determined by your treatment team.   Patients discharged the day of surgery will not be allowed to drive home.   __X__ Take these medicines the morning of surgery with A SIP OF WATER:    1. metoprolol tartrate (LOPRESSOR) 25 MG tablet  2. omeprazole (PRILOSEC) 20 MG  capsule  3.   4.  5.  6.  ____ Fleet Enema (as directed)   __X__ Use CHG Soap/SAGE wipes as directed  __X__ Use inhalers on the day of surgery  ____ Stop metformin/Janumet/Farxiga 2 days prior to surgery    ____ Take 1/2 of usual insulin dose the night before surgery. No insulin the morning          of surgery.   ____ Stop Blood Thinners Coumadin/Plavix/Xarelto/Pleta/Pradaxa/Eliquis/Effient/Aspirin  on   Or contact your Surgeon, Cardiologist or Medical Doctor regarding  ability to stop your blood thinners  __X__ Stop Anti-inflammatories 7 days before surgery such as Advil, Ibuprofen, Motrin,  BC or Goodies Powder, Naprosyn, Naproxen, Aleve, Aspirin    __X__ Stop all herbals and supplements, fish oil or vitamins  until after surgery.    ____ Bring C-Pap to the hospital.   You may continue Tylenol as needed

## 2022-02-15 ENCOUNTER — Other Ambulatory Visit: Payer: Self-pay | Admitting: General Surgery

## 2022-02-15 NOTE — Progress Notes (Signed)
Progress Notes - documented in this encounter Peytan Andringa, [name], MD - 02/01/2022 10:15 AM EDT Formatting of this note is different from the original. Subjective:   Patient ID: Sara Stafford is a 77 y.o. female.  HPI  The following portions of the patient's history were reviewed and updated as appropriate.  This an established patient is here today for: office visit. The patient is here today for re-evaluation of her gallbladder. Patient had called and spoken with Dr. Bary Castilla over the weekend due to pain. She has been having right upper quadrant pain but seems to be getting worse over the past 2 weeks. Patient reports she ate gravy Sunday night and thinks that may have triggered the pain. She denies any other food triggers.   The patient had called on Labor Day evening, reporting the same abdominal pain. She failed to mention at that time she had been seen in the ED on January 21, 2022. When questioned today about "what was different" she was somewhat vague, only that the discomfort on the right side of the abdomen was more pronounced. No fever, chills. No vomiting.  Patient admits to a 10 pound weight loss over the last several months. She has been eating different because her hemoglobin A1C is elevated. Patient has a new meter and will start checking her blood sugars.   She is here with her sister, Sara Stafford.  Chief Complaint  Patient presents with  Follow-up    Today: BP (!) 158/78  Pulse 85  Temp 36.4 C (97.6 F)  Ht 165.1 cm (5\' 5" )  Wt 56.2 kg (124 lb)  SpO2 95%  BMI 20.63 kg/m   December 14, 2021: BP 124/70  Pulse 88  Wt 58.4 kg (128 lb 12.8 oz)   Past Medical History:  Diagnosis Date  CKD (chronic kidney disease)  COPD (chronic obstructive pulmonary disease) (CMS-HCC)  Depressive disorder  Diabetes (CMS-HCC)  DM type 2 with diabetic mixed hyperlipidemia (CMS-HCC) 06/09/2021  Fatigue  Fibromyalgia  GERD (gastroesophageal reflux disease)   Hyperlipidemia  Hypertension  Macular degeneration  Osteoarthritis  Sciatica    Past Surgical History:  Procedure Laterality Date  CATARACT EXTRACTION Right 01/26/2015  CATARACT EXTRACTION Left 02/09/2015  EGD 04/28/2021  Gastritis/Multiple gastric polyps/Repeat as needed, based on clinical needs only/[facility]  COLONOSCOPY 04/28/2021  Hyperplastic polyp/Repeat as needed, based on clinical needs only/[facility]  ABDOMINAL HYSTERECTOMY    OB History   Gravida  2  Para  2  Term   Preterm   AB   Living     SAB   IAB   Ectopic   Molar   Multiple   Live Births     Obstetric Comments  Age at first period 63 Age of first pregnancy 20      Social History   Socioeconomic History  Marital status: Married  Tobacco Use  Smoking status: Former  Packs/day: 1.00  Years: 50.00  Pack years: 50.00  Types: Cigarettes  Quit date: 06/06/2010  Years since quitting: 11.6  Smokeless tobacco: Never  Vaping Use  Vaping Use: Never used  Substance and Sexual Activity  Alcohol use: Not Currently  Drug use: Never  Sexual activity: Defer    Allergies  Allergen Reactions  Codeine Vomiting  Doxycycline Other (See Comments)  thrush   Current Outpatient Medications  Medication Sig Dispense Refill  ADVAIR DISKUS 250-50 mcg/dose diskus inhaler Inhale 1 inhalation into the lungs 2 (two) times daily 1 each 11  albuterol 90 mcg/actuation inhaler Inhale 2 inhalations into  the lungs every 4 (four) hours as needed 18 g 11  ALPRAZolam (XANAX) 0.25 MG tablet Take 1 tablet (0.25 mg total) by mouth at bedtime as needed for Sleep 30 tablet 5  azelastine (ASTELIN) 137 mcg nasal spray as needed  diclofenac (VOLTAREN) 75 MG EC tablet Take 1 tablet (75 mg total) by mouth daily with breakfast 30 tablet 1  gabapentin (NEURONTIN) 100 MG capsule Take 1 capsule (100 mg total) by mouth 2 (two) times daily 90 capsule 3  glipiZIDE (GLUCOTROL XL) 10 MG XL tablet Take 1 tablet (10 mg total) by mouth  2 (two) times daily 180 tablet 3  ketoconazole (NIZORAL) 2 % cream 1 application  metFORMIN (GLUCOPHAGE-XR) 750 MG XR tablet Take 1 tablet (750 mg total) by mouth daily with dinner 30 tablet 11  omeprazole (PRILOSEC) 20 MG DR capsule Take 1 capsule (20 mg total) by mouth once daily 90 capsule 3  rosuvastatin (CRESTOR) 20 MG tablet Take 1 tablet (20 mg total) by mouth once daily 90 tablet 3  silver sulfADIAZINE (SSD) 1 % cream Apply topically 2 (two) times daily 50 g 1  triamcinolone 0.1 % lotion Apply topically 3 (three) times daily 60 mL 0  venlafaxine (EFFEXOR) 37.5 MG tablet Take 1 tablet (37.5 mg total) by mouth once daily (Patient taking differently: Take 37.5 mg by mouth once daily 1/4 tab daily) 90 tablet 3  vit C/E/cuperic/zinc/lutein (PRESERVISION LUTEIN ORAL) Take by mouth 2 (two) times daily   No current facility-administered medications for this visit.   Family History  Problem Relation Age of Onset  Asthma Father  Diabetes Father  Stroke Father  Liver cancer Sister  Pancreatic cancer Sister  Brain cancer Brother  Diabetes Paternal Grandmother  Breast cancer Maternal Aunt   Labs and Radiology:   ED records of January 21, 2022 reviewed:  Normal sinus rhythm Septal infarct (cited on or before 14-Jan-2021) Abnormal ECG When compared with ECG of 14-Jan-2021 10:10, Questionable change in initial forces of Septal leads Confirmed by UNCONFIRMED, DOCTOR ([id number]), editor Kearney Hard ([id number]) on 01/21/2022 8:46:19 AM  Lipase 11 - 51 U/L 57 High  Sodium 135 - 145 mmol/L 138  Potassium 3.5 - 5.1 mmol/L 4.3  Chloride 98 - 111 mmol/L 100  CO2 22 - 32 mmol/L 27  Glucose, Bld 70 - 99 mg/dL 631 High  Comment: Glucose reference range applies only to samples taken after fasting for at least 8 hours. BUN 8 - 23 mg/dL 18  Creatinine, Ser 4.97 - 1.00 mg/dL 0.26  Calcium 8.9 - 37.8 mg/dL 9.7  Total Protein 6.5 - 8.1 g/dL 7.1  Albumin 3.5 - 5.0 g/dL 4.3  AST 15 - 41 U/L 22  ALT 0 - 44  U/L 13  Alkaline Phosphatase 38 - 126 U/L 70  Total Bilirubin 0.3 - 1.2 mg/dL 0.5  GFR, Estimated >58 mL/min 59 Low  WBC 4.0 - 10.5 K/uL 6.0  RBC 3.87 - 5.11 MIL/uL 4.10  Hemoglobin 12.0 - 15.0 g/dL 85.0  HCT 27.7 - 41.2 % 39.1  MCV 80.0 - 100.0 fL 95.4  MCH 26.0 - 34.0 pg 30.2  MCHC 30.0 - 36.0 g/dL 87.8  RDW 67.6 - 72.0 % 11.9  Platelets 150 - 400 K/uL 284  nRBC 0.0 - 0.2 % 0.0  Neutrophils Relative % % 70  Neutro Abs 1.7 - 7.7 K/uL 4.3  Lymphocytes Relative % 20  Lymphs Abs 0.7 - 4.0 K/uL 1.2  Monocytes Relative % 8  Monocytes Absolute 0.1 - 1.0  K/uL 0.5  Eosinophils Relative % 1  Eosinophils Absolute 0.0 - 0.5 K/uL 0.0  Basophils Relative % 1  Basophils Absolute 0.0 - 0.1 K/uL 0.1  Immature Granulocytes % 0  Abs Immature Granulocytes 0.00 - 0.07 K/uL 0.01   CT of the abdomen for renal stones January 14, 2021 showed calcified gallstones. HIDA scan on April 06, 2021 showed a nonfunctioning gallbladder.  January 21, 2022 CT:  IMPRESSION: 1. No acute abdominopelvic findings. 2. Cholelithiasis within a moderately dilated gallbladder. No pericholecystic inflammatory changes by CT. If there is clinical suspicion for cholecystitis, recommend ultrasound. 3. Prominent rectal stool ball measuring 8 cm in diameter. 4. Colonic diverticulosis without evidence of acute diverticulitis. 5. Aortic atherosclerosis (ICD10-I70.0).  Review of Systems  Constitutional: Negative for chills and fever.  Respiratory: Negative for cough.    Objective:  Physical Exam Exam conducted with a chaperone present.  Constitutional:  Appearance: Normal appearance.  Cardiovascular:  Rate and Rhythm: Normal rate and regular rhythm.  Pulses: Normal pulses.  Heart sounds: Normal heart sounds.  Pulmonary:  Effort: Pulmonary effort is normal.  Breath sounds: Normal breath sounds.  Abdominal:  General: Abdomen is flat. Bowel sounds are normal.  Palpations: Abdomen is soft.  Tenderness: There is  abdominal tenderness in the right upper quadrant.  Comments: Very mild discomfort with firm pressure in the RUQ. No palpable enlargement of the liver or gallbladder.  Musculoskeletal:  Cervical back: Neck supple.  Skin: General: Skin is warm and dry.  Neurological:  Mental Status: She is alert and oriented to person, place, and time.  Psychiatric:  Mood and Affect: Mood normal.  Behavior: Behavior normal.    Assessment:   Longstanding right sided upper abdominal pain, longstanding cholelithiasis.  Weight loss, likely related to food avoidance after elevated hemoglobin A1c July 2023.  Diabetes, reasonable control until last checkup.  Plan:   Considering all the pros and cons, with the patient's diabetes being somewhat out of kilter, and the possibility of low-grade cholecystitis, I think it is reasonable to take the risks associated with elective cholecystectomy. With a nonfunctioning gallbladder and diabetes she is at slightly higher risk for acute cholecystitis, and this could be avoided with elective cholecystectomy.  Risks associated with surgery including bleeding, injury to adjacent organs and the possibility of an open procedure were reviewed.  Her sister asked that the patient be kept overnight, and unless there is a medical indication, she will be a candidate for discharge the same day of surgery, which is the patient desire.     This note is partially prepared by Ledell Noss, CMA acting as a scribe in the presence of Dr. Hervey Ard, MD.   The documentation recorded by the scribe accurately reflects the service I personally performed and the decisions made by me.   Robert Bellow, MD FACS'   Electronically signed by Mayer Masker, MD at 02/01/2022 1:13 PM EDT   She had UTI symptoms February 03, 2022 and received antibiotics with resolution of her symptoms. Culture mixed flora.  Surgery rescheduled for September 20,2023.

## 2022-02-16 ENCOUNTER — Encounter: Payer: Self-pay | Admitting: General Surgery

## 2022-02-16 ENCOUNTER — Ambulatory Visit
Admission: RE | Admit: 2022-02-16 | Discharge: 2022-02-16 | Disposition: A | Discharge status: Home / Self Care | Payer: Medicare Other | Source: Ambulatory Visit | Attending: General Surgery | Admitting: General Surgery

## 2022-02-16 ENCOUNTER — Other Ambulatory Visit: Payer: Self-pay

## 2022-02-16 ENCOUNTER — Encounter
Admission: RE | Disposition: A | Discharge status: Home / Self Care | Payer: Self-pay | Source: Ambulatory Visit | Attending: General Surgery

## 2022-02-16 ENCOUNTER — Ambulatory Visit: Payer: Medicare Other | Admitting: Anesthesiology

## 2022-02-16 ENCOUNTER — Ambulatory Visit: Payer: Medicare Other

## 2022-02-16 DIAGNOSIS — K219 Gastro-esophageal reflux disease without esophagitis: Secondary | ICD-10-CM | POA: Diagnosis not present

## 2022-02-16 DIAGNOSIS — K8010 Calculus of gallbladder with chronic cholecystitis without obstruction: Secondary | ICD-10-CM | POA: Diagnosis present

## 2022-02-16 DIAGNOSIS — F419 Anxiety disorder, unspecified: Secondary | ICD-10-CM | POA: Insufficient documentation

## 2022-02-16 DIAGNOSIS — I10 Essential (primary) hypertension: Secondary | ICD-10-CM | POA: Insufficient documentation

## 2022-02-16 DIAGNOSIS — M1990 Unspecified osteoarthritis, unspecified site: Secondary | ICD-10-CM | POA: Diagnosis not present

## 2022-02-16 DIAGNOSIS — D649 Anemia, unspecified: Secondary | ICD-10-CM | POA: Diagnosis not present

## 2022-02-16 DIAGNOSIS — E0800 Diabetes mellitus due to underlying condition with hyperosmolarity without nonketotic hyperglycemic-hyperosmolar coma (NKHHC): Secondary | ICD-10-CM

## 2022-02-16 DIAGNOSIS — E119 Type 2 diabetes mellitus without complications: Secondary | ICD-10-CM | POA: Diagnosis not present

## 2022-02-16 DIAGNOSIS — J449 Chronic obstructive pulmonary disease, unspecified: Secondary | ICD-10-CM | POA: Diagnosis not present

## 2022-02-16 DIAGNOSIS — Z87891 Personal history of nicotine dependence: Secondary | ICD-10-CM | POA: Insufficient documentation

## 2022-02-16 DIAGNOSIS — Z7984 Long term (current) use of oral hypoglycemic drugs: Secondary | ICD-10-CM | POA: Diagnosis not present

## 2022-02-16 DIAGNOSIS — F32A Depression, unspecified: Secondary | ICD-10-CM | POA: Insufficient documentation

## 2022-02-16 HISTORY — PX: CHOLECYSTECTOMY: SHX55

## 2022-02-16 LAB — GLUCOSE, CAPILLARY
Glucose-Capillary: 204 mg/dL — ABNORMAL HIGH (ref 70–99)
Glucose-Capillary: 158 mg/dL — ABNORMAL HIGH (ref 70–99)

## 2022-02-16 SURGERY — LAPAROSCOPIC CHOLECYSTECTOMY WITH INTRAOPERATIVE CHOLANGIOGRAM
Anesthesia: General | Site: Abdomen

## 2022-02-16 MED ORDER — BUPIVACAINE-EPINEPHRINE (PF) 0.5% -1:200000 IJ SOLN
INTRAMUSCULAR | Status: AC
  Filled 2022-02-16: qty 30

## 2022-02-16 MED ORDER — OXYCODONE HCL 5 MG/5ML PO SOLN: 5.0000 mg | Freq: Once | ORAL | Status: AC | PRN

## 2022-02-16 MED ORDER — LACTATED RINGERS IR SOLN
Status: DC | PRN
  Administered 2022-02-16: 1000 mL

## 2022-02-16 MED ORDER — FENTANYL CITRATE (PF) 100 MCG/2ML IJ SOLN
INTRAMUSCULAR | Status: AC
  Filled 2022-02-16: qty 2

## 2022-02-16 MED ORDER — CHLORHEXIDINE GLUCONATE CLOTH 2 % EX PADS: 6.0000 | MEDICATED_PAD | Freq: Once | CUTANEOUS | Status: DC

## 2022-02-16 MED ORDER — LABETALOL HCL 5 MG/ML IV SOLN
INTRAVENOUS | Status: AC
  Administered 2022-02-16: 5 mg via INTRAVENOUS
  Filled 2022-02-16: qty 4

## 2022-02-16 MED ORDER — CEFAZOLIN SODIUM-DEXTROSE 2-4 GM/100ML-% IV SOLN
INTRAVENOUS | Status: AC
  Filled 2022-02-16: qty 100

## 2022-02-16 MED ORDER — KETOROLAC TROMETHAMINE 30 MG/ML IJ SOLN
INTRAMUSCULAR | Status: DC | PRN
  Administered 2022-02-16: 15 mg via INTRAVENOUS

## 2022-02-16 MED ORDER — LIDOCAINE HCL (CARDIAC) PF 100 MG/5ML IV SOSY
PREFILLED_SYRINGE | INTRAVENOUS | Status: DC | PRN
  Administered 2022-02-16: 50 mg via INTRAVENOUS

## 2022-02-16 MED ORDER — CHLORHEXIDINE GLUCONATE 0.12 % MT SOLN: 15.0000 mL | Freq: Once | OROMUCOSAL | Status: AC

## 2022-02-16 MED ORDER — ACETAMINOPHEN 10 MG/ML IV SOLN
INTRAVENOUS | Status: DC | PRN
  Administered 2022-02-16: 1000 mg via INTRAVENOUS

## 2022-02-16 MED ORDER — FENTANYL CITRATE (PF) 100 MCG/2ML IJ SOLN
INTRAMUSCULAR | Status: AC
  Administered 2022-02-16: 25 ug via INTRAVENOUS
  Filled 2022-02-16: qty 2

## 2022-02-16 MED ORDER — OXYCODONE HCL 5 MG PO TABS: 5.0000 mg | ORAL_TABLET | Freq: Once | ORAL | Status: AC | PRN

## 2022-02-16 MED ORDER — CHLORHEXIDINE GLUCONATE CLOTH 2 % EX PADS
6.0000 | MEDICATED_PAD | Freq: Once | CUTANEOUS | Status: AC
  Administered 2022-02-16: 6 via TOPICAL

## 2022-02-16 MED ORDER — LIDOCAINE HCL (PF) 2 % IJ SOLN
INTRAMUSCULAR | Status: AC
  Filled 2022-02-16: qty 5

## 2022-02-16 MED ORDER — ACETAMINOPHEN 10 MG/ML IV SOLN
INTRAVENOUS | Status: AC
  Filled 2022-02-16: qty 100

## 2022-02-16 MED ORDER — INSULIN ASPART 100 UNIT/ML IJ SOLN
INTRAMUSCULAR | Status: AC
  Filled 2022-02-16: qty 1

## 2022-02-16 MED ORDER — OXYCODONE HCL 5 MG PO TABS
ORAL_TABLET | ORAL | Status: AC
  Administered 2022-02-16: 5 mg via ORAL
  Filled 2022-02-16: qty 1

## 2022-02-16 MED ORDER — CHLORHEXIDINE GLUCONATE 0.12 % MT SOLN
OROMUCOSAL | Status: AC
  Administered 2022-02-16: 15 mL via OROMUCOSAL
  Filled 2022-02-16: qty 15

## 2022-02-16 MED ORDER — ONDANSETRON HCL 4 MG/2ML IJ SOLN
INTRAMUSCULAR | Status: AC
  Filled 2022-02-16: qty 2

## 2022-02-16 MED ORDER — SODIUM CHLORIDE (PF) 0.9 % IJ SOLN
INTRAMUSCULAR | Status: AC
  Filled 2022-02-16: qty 50

## 2022-02-16 MED ORDER — DROPERIDOL 2.5 MG/ML IJ SOLN
INTRAMUSCULAR | Status: AC
  Administered 2022-02-16: 0.625 mg via INTRAVENOUS
  Filled 2022-02-16: qty 2

## 2022-02-16 MED ORDER — PROPOFOL 10 MG/ML IV BOLUS
INTRAVENOUS | Status: DC | PRN
  Administered 2022-02-16: 100 mg via INTRAVENOUS

## 2022-02-16 MED ORDER — ORAL CARE MOUTH RINSE: 15.0000 mL | Freq: Once | OROMUCOSAL | Status: AC

## 2022-02-16 MED ORDER — CEFAZOLIN SODIUM-DEXTROSE 2-4 GM/100ML-% IV SOLN
2.0000 g | INTRAVENOUS | Status: AC
  Administered 2022-02-16: 2 g via INTRAVENOUS

## 2022-02-16 MED ORDER — PROMETHAZINE HCL 25 MG/ML IJ SOLN: 6.2500 mg | INTRAMUSCULAR | Status: DC | PRN

## 2022-02-16 MED ORDER — DEXAMETHASONE SODIUM PHOSPHATE 10 MG/ML IJ SOLN
INTRAMUSCULAR | Status: AC
  Filled 2022-02-16: qty 1

## 2022-02-16 MED ORDER — INSULIN ASPART 100 UNIT/ML IJ SOLN
4.0000 [IU] | Freq: Once | INTRAMUSCULAR | Status: AC
  Administered 2022-02-16: 4 [IU] via SUBCUTANEOUS

## 2022-02-16 MED ORDER — DROPERIDOL 2.5 MG/ML IJ SOLN: 0.6250 mg | Freq: Once | INTRAMUSCULAR | Status: AC | PRN

## 2022-02-16 MED ORDER — SODIUM CHLORIDE 0.9 % IV SOLN: INTRAVENOUS | Status: DC

## 2022-02-16 MED ORDER — PHENYLEPHRINE 80 MCG/ML (10ML) SYRINGE FOR IV PUSH (FOR BLOOD PRESSURE SUPPORT)
PREFILLED_SYRINGE | INTRAVENOUS | Status: DC | PRN
  Administered 2022-02-16: 80 ug via INTRAVENOUS
  Administered 2022-02-16 (×2): 160 ug via INTRAVENOUS
  Administered 2022-02-16: 80 ug via INTRAVENOUS

## 2022-02-16 MED ORDER — METOPROLOL TARTRATE 5 MG/5ML IV SOLN
INTRAVENOUS | Status: DC | PRN
  Administered 2022-02-16: 2 mg via INTRAVENOUS
  Administered 2022-02-16: 1 mg via INTRAVENOUS

## 2022-02-16 MED ORDER — SUGAMMADEX SODIUM 200 MG/2ML IV SOLN
INTRAVENOUS | Status: DC | PRN
  Administered 2022-02-16: 200 mg via INTRAVENOUS

## 2022-02-16 MED ORDER — FENTANYL CITRATE (PF) 100 MCG/2ML IJ SOLN
25.0000 ug | INTRAMUSCULAR | Status: AC | PRN
  Administered 2022-02-16 (×4): 25 ug via INTRAVENOUS

## 2022-02-16 MED ORDER — ACETAMINOPHEN 10 MG/ML IV SOLN: 1000.0000 mg | Freq: Once | INTRAVENOUS | Status: DC | PRN

## 2022-02-16 MED ORDER — IOHEXOL 180 MG/ML  SOLN
INTRAMUSCULAR | Status: DC | PRN
  Administered 2022-02-16: 10 mL

## 2022-02-16 MED ORDER — DEXAMETHASONE SODIUM PHOSPHATE 10 MG/ML IJ SOLN
INTRAMUSCULAR | Status: DC | PRN
  Administered 2022-02-16: 10 mg via INTRAVENOUS

## 2022-02-16 MED ORDER — TRAMADOL HCL 50 MG PO TABS: 50.0000 mg | ORAL_TABLET | Freq: Four times a day (QID) | ORAL | 0 refills | Status: DC | PRN

## 2022-02-16 MED ORDER — FENTANYL CITRATE (PF) 100 MCG/2ML IJ SOLN
INTRAMUSCULAR | Status: DC | PRN
  Administered 2022-02-16 (×2): 25 ug via INTRAVENOUS
  Administered 2022-02-16: 50 ug via INTRAVENOUS

## 2022-02-16 MED ORDER — ROCURONIUM BROMIDE 100 MG/10ML IV SOLN
INTRAVENOUS | Status: DC | PRN
  Administered 2022-02-16: 40 mg via INTRAVENOUS

## 2022-02-16 MED ORDER — LABETALOL HCL 5 MG/ML IV SOLN
5.0000 mg | INTRAVENOUS | Status: AC | PRN
  Administered 2022-02-16 (×3): 5 mg via INTRAVENOUS

## 2022-02-16 MED ORDER — ROCURONIUM BROMIDE 10 MG/ML (PF) SYRINGE
PREFILLED_SYRINGE | INTRAVENOUS | Status: AC
  Filled 2022-02-16: qty 10

## 2022-02-16 SURGICAL SUPPLY — 39 items
APL PRP STRL LF DISP 70% ISPRP (MISCELLANEOUS) ×1
APPLIER CLIP ROT 10 11.4 M/L (STAPLE) ×1
APR CLP MED LRG 11.4X10 (STAPLE) ×1
BLADE SURG 11 STRL SS SAFETY (MISCELLANEOUS) ×1 IMPLANT
CANNULA DILATOR 10 W/SLV (CANNULA) ×1 IMPLANT
CANNULA DILATOR 5 W/SLV (CANNULA) ×2 IMPLANT
CATH CHOLANG 76X19 KUMAR (CATHETERS) ×1 IMPLANT
CHLORAPREP W/TINT 26 (MISCELLANEOUS) ×1 IMPLANT
CLIP APPLIE ROT 10 11.4 M/L (STAPLE) ×1 IMPLANT
CORD MONOPOLAR M/FML 12FT (MISCELLANEOUS) ×1 IMPLANT
DRAPE C-ARM 42X70 (DRAPES) ×1 IMPLANT
DRSG TEGADERM 2-3/8X2-3/4 SM (GAUZE/BANDAGES/DRESSINGS) ×4 IMPLANT
DRSG TELFA 3X8 NADH STRL (GAUZE/BANDAGES/DRESSINGS) IMPLANT
ELECT REM PT RETURN 9FT ADLT (ELECTROSURGICAL) ×1
ELECTRODE REM PT RTRN 9FT ADLT (ELECTROSURGICAL) ×1 IMPLANT
GLOVE BIO SURGEON STRL SZ7.5 (GLOVE) ×1 IMPLANT
GLOVE SURG UNDER LTX SZ8 (GLOVE) ×1 IMPLANT
GOWN STRL REUS W/ TWL LRG LVL3 (GOWN DISPOSABLE) ×3 IMPLANT
GOWN STRL REUS W/TWL LRG LVL3 (GOWN DISPOSABLE) ×3
GRASPER SUT TROCAR 14GX15 (MISCELLANEOUS) IMPLANT
IRRIGATION STRYKERFLOW (MISCELLANEOUS) ×1 IMPLANT
IRRIGATOR STRYKERFLOW (MISCELLANEOUS) ×1
IV LACTATED RINGERS 1000ML (IV SOLUTION) ×1 IMPLANT
KIT TURNOVER KIT A (KITS) ×1 IMPLANT
LABEL OR SOLS (LABEL) ×1 IMPLANT
MANIFOLD NEPTUNE II (INSTRUMENTS) ×1 IMPLANT
NDL INSUFF ACCESS 14 VERSASTEP (NEEDLE) ×1 IMPLANT
NS IRRIG 500ML POUR BTL (IV SOLUTION) ×1 IMPLANT
PACK LAP CHOLECYSTECTOMY (MISCELLANEOUS) ×1 IMPLANT
SCISSORS METZENBAUM CVD 33 (INSTRUMENTS) ×1 IMPLANT
SET TUBE SMOKE EVAC HIGH FLOW (TUBING) ×1 IMPLANT
STRIP CLOSURE SKIN 1/2X4 (GAUZE/BANDAGES/DRESSINGS) ×1 IMPLANT
SUT VIC AB 0 CT2 27 (SUTURE) IMPLANT
SUT VIC AB 4-0 FS2 27 (SUTURE) ×1 IMPLANT
SWABSTK COMLB BENZOIN TINCTURE (MISCELLANEOUS) ×1 IMPLANT
SYR 30ML LL (SYRINGE) IMPLANT
SYS BAG RETRIEVAL 10MM (BASKET) ×1
SYSTEM BAG RETRIEVAL 10MM (BASKET) IMPLANT
TROCAR XCEL NON-BLD 11X100MML (ENDOMECHANICALS) ×1 IMPLANT

## 2022-02-16 NOTE — Anesthesia Procedure Notes (Signed)
Procedure Name: Intubation Date/Time: 02/16/2022 12:04 PM  Performed by: Loletha Grayer, CRNAPre-anesthesia Checklist: Patient identified, Patient being monitored, Timeout performed, Emergency Drugs available and Suction available Patient Re-evaluated:Patient Re-evaluated prior to induction Oxygen Delivery Method: Circle system utilized Preoxygenation: Pre-oxygenation with 100% oxygen Induction Type: IV induction Ventilation: Mask ventilation without difficulty and Oral airway inserted - appropriate to patient size Laryngoscope Size: Sabra Heck and 2 Grade View: Grade II Tube type: Oral Tube size: 6.5 mm Number of attempts: 1 Airway Equipment and Method: Stylet Placement Confirmation: ETT inserted through vocal cords under direct vision, positive ETCO2 and breath sounds checked- equal and bilateral Secured at: 20 cm Tube secured with: Tape Dental Injury: Teeth and Oropharynx as per pre-operative assessment

## 2022-02-16 NOTE — Transfer of Care (Signed)
Immediate Anesthesia Transfer of Care Note  Patient: Sara Stafford  Procedure(s) Performed: LAPAROSCOPIC CHOLECYSTECTOMY WITH INTRAOPERATIVE CHOLANGIOGRAM (Abdomen)  Patient Location: PACU  Anesthesia Type:General  Level of Consciousness: awake and patient cooperative  Airway & Oxygen Therapy: Patient Spontanous Breathing and Patient connected to face mask oxygen  Post-op Assessment: Report given to RN and Post -op Vital signs reviewed and stable  Post vital signs: Reviewed and stable  Last Vitals:  Vitals Value Taken Time  BP    Temp    Pulse    Resp 19 02/16/22 1334  SpO2    Vitals shown include unvalidated device data.  Last Pain:  Vitals:   02/16/22 1051  TempSrc: Temporal  PainSc: 0-No pain         Complications: No notable events documented.

## 2022-02-16 NOTE — Anesthesia Preprocedure Evaluation (Addendum)
Anesthesia Evaluation  Patient identified by MRN, date of birth, ID band Patient awake    Reviewed: Allergy & Precautions, NPO status , Patient's Chart, lab work & pertinent test results, reviewed documented beta blocker date and time   Airway Mallampati: III  TM Distance: <3 FB Neck ROM: full    Dental  (+) Teeth Intact, Caps   Pulmonary COPD, former smoker,    Pulmonary exam normal  + decreased breath sounds      Cardiovascular Exercise Tolerance: Good hypertension, Pt. on medications Normal cardiovascular exam Rhythm:Regular     Neuro/Psych PSYCHIATRIC DISORDERS Anxiety Depression negative neurological ROS     GI/Hepatic Neg liver ROS, GERD  Medicated and Controlled,gallstones   Endo/Other  diabetes, Well Controlled, Type 2, Oral Hypoglycemic Agents  Renal/GU negative Renal ROS  negative genitourinary   Musculoskeletal  (+) Arthritis ,   Abdominal Normal abdominal exam  (+)   Peds negative pediatric ROS (+)  Hematology  (+) Blood dyscrasia, anemia ,   Anesthesia Other Findings Past Medical History: No date: Arthritis No date: COPD (chronic obstructive pulmonary disease) (HCC)     Comment:  no change with spiriva, poor tolerance of advair No date: Depressive disorder, not elsewhere classified No date: Diabetes mellitus No date: Fatigue No date: Fibromyalgia No date: GERD (gastroesophageal reflux disease) No date: Hyperlipidemia No date: Routine general medical examination at a health care facility  Past Surgical History: No date: ABDOMINAL HYSTERECTOMY     Comment:  partial 02/09/2015: CATARACT EXTRACTION; Left 01/26/2015: CATARACT EXTRACTION; Right  BMI    Body Mass Index: 21.47 kg/m      Reproductive/Obstetrics negative OB ROS                            Anesthesia Physical  Anesthesia Plan  ASA: 3  Anesthesia Plan: General   Post-op Pain Management: Regional block*,  Toradol IV (intra-op)* and Ofirmev IV (intra-op)*   Induction: Intravenous  PONV Risk Score and Plan: 4 or greater and Ondansetron, Dexamethasone and Treatment may vary due to age or medical condition  Airway Management Planned: Oral ETT  Additional Equipment:   Intra-op Plan:   Post-operative Plan: Extubation in OR  Informed Consent: I have reviewed the patients History and Physical, chart, labs and discussed the procedure including the risks, benefits and alternatives for the proposed anesthesia with the patient or authorized representative who has indicated his/her understanding and acceptance.     Dental Advisory Given  Plan Discussed with: CRNA and Surgeon  Anesthesia Plan Comments:        Anesthesia Quick Evaluation

## 2022-02-16 NOTE — Op Note (Signed)
Preoperative diagnosis: Chronic cholecystitis and cholelithiasis.  Postoperative diagnosis: Same.  Operative procedure: Laparoscopic cholecystectomy with intraoperative cholangiograms.  Operating surgeon: Hervey Ard, MD.  Assistant: Elsie Stain, RNFA.  Anesthesia: General endotracheal, Marcaine 0.5% with 1: 200,000 units of epinephrine, 20 cc local infiltration.  Estimated blood loss: 5 cc.  Clinical note: This 76 year old old diabetic has had vague abdominal symptoms.  Ultrasound showed evidence of cholelithiasis.  HIDA scan showed nonfunctioning gallbladder.  In light of her abdominal symptoms and nonfunctioning gallbladder she was felt to be reasonable candidate for cholecystectomy.  She received Ancef prior to the procedure.  SCD stockings for DVT prevention.  Operative note: With the patient under adequate general anesthesia the abdomen was cleansed with ChloraPrep and draped.  A varies needle was placed placed with transumbilical incision.  After assuring intra-abdominal location with a hanging drop test the abdomen was insufflated with CO2 at 10 mmHg pressure.  A 10 mm Neck step port was expanded and inspection showed no evidence of intestinal injury.  A small amount of blood was noted the underlying bowel.  Less than a cc.  The patient was placed in reverse Trendelenburg position and rolled to the left.  An 11 mm XL port was placed in the epigastrium and 2-5 mm Ports were placed in the right lateral abdominal wall.  The gallbladder was noted to be without evidence of acute inflammation but distended.  Needle catheter drainage was unsuccessful due to the thick, mucoid characteristic of fluid.  A 5 mm opening was made with cautery and even with the 5 mm suction cannula the mucoid material was difficult to extract.  The area of the rent was grasped to control further drainage and the gallbladder placed on cephalad traction.  Enough fluid had been removed to allow easier mobility.  The  neck of the gallbladder was cleared.  The anterior and posterior branches of the cystic artery were controlled with hemoclips.  There was a stone lodged in the neck of the gallbladder.  A Kumar clamp was placed and the needle placed distal to the stone just at the origin of the cystic duct.  Fluoroscopic cholangiograms were completed using 10 cc of 1 half-strength Conray 60.  Prompt filling of the common bile duct which was moderately dilated but without evidence of filling defect.  Free flow into the duodenum.  The cystic duct was doubly clipped and divided.  The gallbladder was removed from liver bed making use of hook cautery dissection.  It had a fairly thin wall and 2 rents were made.  The mucoid material was controlled and no stones were lost.  After the gallbladder was freed from the liver bed it was placed into an Endo Catch bag placed with a 10 mm umbilical port.  It was then extracted after expanding the incision slightly.  The inspection from the epigastric site showed that the bleeding was from a vessel along the anterior abdominal wall just in the.  The umbilical port.  This was controlled with a 0 Vicryl figure-of-eight suture past making use of the PMI suture passer.  After reestablishing pneumoperitoneum the right upper quadrant was irrigated with lactated Ringer solution.  No residual material was noted.  Good hemostasis.  Clips appropriately replaced.  The abdomen was then desufflated under direct vision.  The fascia at the umbilicus was closed with an additional 0 Vicryl figure-of-eight suture.  Skin incisions were closed with 4-0 Vicryl subcuticular sutures.  Local anesthetic was infiltrated for postoperative analgesia.  Benzoin, Steri-Strips, Telfa  and Tegaderm dressings were applied.  Patient tolerated procedure well and was taken the PACU in stable condition.

## 2022-02-16 NOTE — H&P (Signed)
Sara Stafford [id number] 07/26/1945     HPI:  No change in clinical history or exam.  Recent URI symptoms resolved.  Non-functioning gallbladder in a diabetic with vague abdominal pain.   No medications prior to admission.   Allergies  Allergen Reactions   Jardiance [Empagliflozin] Nausea And Vomiting   Ozempic (0.25 Or 0.5 Mg-Dose) [Semaglutide(0.25 Or 0.5mg -Dos)] Nausea And Vomiting   Doxycycline     REACTION: thrush   Past Medical History:  Diagnosis Date   Arthritis    COPD (chronic obstructive pulmonary disease) (HCC)    no change with spiriva, poor tolerance of advair   Depressive disorder, not elsewhere classified    Diabetes mellitus    Fatigue    Fibromyalgia    GERD (gastroesophageal reflux disease)    Hyperlipidemia    Hypertension    Macular degeneration    Routine general medical examination at a health care facility    Past Surgical History:  Procedure Laterality Date   ABDOMINAL HYSTERECTOMY     partial   CATARACT EXTRACTION Left 02/09/2015   CATARACT EXTRACTION Right 01/26/2015   COLONOSCOPY WITH PROPOFOL N/A 04/28/2021   Procedure: COLONOSCOPY WITH PROPOFOL;  Surgeon: Robert Bellow, MD;  Location: [facility] END[facility]COPY;  Service: Endoscopy;  Laterality: N/A;   ESOPHAGOGASTRODUODENOSCOPY (EGD) WITH PROPOFOL N/A 04/28/2021   Procedure: ESOPHAGOGASTRODUODENOSCOPY (EGD) WITH PROPOFOL;  Surgeon: Robert Bellow, MD;  Location: [facility] ENDOSCOPY;  Service: Endoscopy;  Laterality: N/A;   Social History   Socioeconomic History   Marital status: Married    Spouse name: Not on file   Number of children: 2   Years of education: Not on file   Highest education level: Not on file  Occupational History   Occupation: retired    Fish farm manager: RETIRED    Comment: Sara Stafford  Tobacco Use   Smoking status: Former    Packs/day: 1.00    Years: 50.00    Total pack years: 50.00    Types: Cigarettes    Quit date: 06/06/2010    Years since quitting: 11.7   Smokeless  tobacco: Never  Vaping Use   Vaping Use: Never used  Substance and Sexual Activity   Alcohol use: No    Alcohol/week: 0.0 standard drinks of alcohol   Drug use: No   Sexual activity: Never  Other Topics Concern   Not on file  Social History Narrative   Children aged 92, and 54, expecting 2nd grandchild   Social Determinants of Radio broadcast assistant Strain: Not on file  Food Insecurity: Not on file  Transportation Needs: Not on file  Physical Activity: Not on file  Stress: Not on file  Social Connections: Not on file  Intimate Partner Violence: Not on file   Social History   Social History Narrative   Children aged 39, and 56, expecting 2nd grandchild     ROS: Negative.     PE: HEENT: Negative. Lungs: Clear. Cardio: RR.     Assessment/Plan:  Proceed with planned cholecystectomy. Sara Stafford [facility] 02/16/2022

## 2022-02-16 NOTE — Discharge Instructions (Signed)

## 2022-02-17 ENCOUNTER — Encounter: Payer: Self-pay | Admitting: General Surgery

## 2022-02-17 LAB — SURGICAL PATHOLOGY

## 2022-02-17 NOTE — Anesthesia Postprocedure Evaluation (Signed)
Anesthesia Post Note  Patient: Sara Stafford  Procedure(s) Performed: LAPAROSCOPIC CHOLECYSTECTOMY WITH INTRAOPERATIVE CHOLANGIOGRAM (Abdomen)  Patient location during evaluation: PACU Anesthesia Type: General Level of consciousness: awake and alert Pain management: pain level controlled Vital Signs Assessment: post-procedure vital signs reviewed and stable Respiratory status: spontaneous breathing, nonlabored ventilation and respiratory function stable Cardiovascular status: blood pressure returned to baseline and stable Postop Assessment: no apparent nausea or vomiting Anesthetic complications: no   No notable events documented.   Last Vitals:  Vitals:   02/16/22 1500 02/16/22 1512  BP: (!) 153/89 (!) 178/95  Pulse: 71 72  Resp: 18 18  Temp: 36.6 C (!) 36.3 C  SpO2: 94% 97%    Last Pain:  Vitals:   02/17/22 0825  TempSrc:   PainSc: [place]

## 2022-04-25 ENCOUNTER — Inpatient Hospital Stay
Admission: EM | Admit: 2022-04-25 | Discharge: 2022-04-28 | DRG: 645 | Disposition: A | Discharge status: Home Health | Payer: Medicare Other | Attending: Internal Medicine | Admitting: Internal Medicine

## 2022-04-25 ENCOUNTER — Other Ambulatory Visit: Payer: Self-pay

## 2022-04-25 DIAGNOSIS — E785 Hyperlipidemia, unspecified: Secondary | ICD-10-CM | POA: Diagnosis present

## 2022-04-25 DIAGNOSIS — E222 Syndrome of inappropriate secretion of antidiuretic hormone: Principal | ICD-10-CM | POA: Diagnosis present

## 2022-04-25 DIAGNOSIS — Z87891 Personal history of nicotine dependence: Secondary | ICD-10-CM

## 2022-04-25 DIAGNOSIS — M797 Fibromyalgia: Secondary | ICD-10-CM | POA: Diagnosis present

## 2022-04-25 DIAGNOSIS — Z888 Allergy status to other drugs, medicaments and biological substances status: Secondary | ICD-10-CM

## 2022-04-25 DIAGNOSIS — J441 Chronic obstructive pulmonary disease with (acute) exacerbation: Secondary | ICD-10-CM | POA: Diagnosis present

## 2022-04-25 DIAGNOSIS — K209 Esophagitis, unspecified without bleeding: Secondary | ICD-10-CM | POA: Diagnosis present

## 2022-04-25 DIAGNOSIS — K219 Gastro-esophageal reflux disease without esophagitis: Secondary | ICD-10-CM | POA: Diagnosis present

## 2022-04-25 DIAGNOSIS — E871 Hypo-osmolality and hyponatremia: Principal | ICD-10-CM | POA: Diagnosis present

## 2022-04-25 DIAGNOSIS — J449 Chronic obstructive pulmonary disease, unspecified: Secondary | ICD-10-CM | POA: Diagnosis present

## 2022-04-25 DIAGNOSIS — Z7984 Long term (current) use of oral hypoglycemic drugs: Secondary | ICD-10-CM

## 2022-04-25 DIAGNOSIS — I10 Essential (primary) hypertension: Secondary | ICD-10-CM | POA: Diagnosis present

## 2022-04-25 DIAGNOSIS — H3530 Unspecified macular degeneration: Secondary | ICD-10-CM | POA: Diagnosis present

## 2022-04-25 DIAGNOSIS — E119 Type 2 diabetes mellitus without complications: Secondary | ICD-10-CM

## 2022-04-25 DIAGNOSIS — E861 Hypovolemia: Secondary | ICD-10-CM | POA: Diagnosis present

## 2022-04-25 DIAGNOSIS — F32A Depression, unspecified: Secondary | ICD-10-CM | POA: Diagnosis present

## 2022-04-25 DIAGNOSIS — Z885 Allergy status to narcotic agent status: Secondary | ICD-10-CM

## 2022-04-25 DIAGNOSIS — Z881 Allergy status to other antibiotic agents status: Secondary | ICD-10-CM

## 2022-04-25 DIAGNOSIS — F411 Generalized anxiety disorder: Secondary | ICD-10-CM | POA: Diagnosis present

## 2022-04-25 DIAGNOSIS — Z79899 Other long term (current) drug therapy: Secondary | ICD-10-CM

## 2022-04-25 LAB — URINALYSIS, ROUTINE W REFLEX MICROSCOPIC
Bilirubin Urine: NEGATIVE
Glucose, UA: NEGATIVE mg/dL
Hgb urine dipstick: NEGATIVE
Ketones, ur: NEGATIVE mg/dL
Leukocytes,Ua: NEGATIVE
Nitrite: NEGATIVE
Protein, ur: NEGATIVE mg/dL
Specific Gravity, Urine: 1.011 (ref 1.005–1.030)
pH: 5 (ref 5.0–8.0)

## 2022-04-25 LAB — CBC WITH DIFFERENTIAL/PLATELET
Abs Immature Granulocytes: 0.07 10*3/uL (ref 0.00–0.07)
Basophils Absolute: 0 10*3/uL (ref 0.0–0.1)
Basophils Relative: 0 %
Eosinophils Absolute: 0 10*3/uL (ref 0.0–0.5)
Eosinophils Relative: 0 %
HCT: 34.4 % — ABNORMAL LOW (ref 36.0–46.0)
Hemoglobin: 11.7 g/dL — ABNORMAL LOW (ref 12.0–15.0)
Immature Granulocytes: 1 %
Lymphocytes Relative: 11 %
Lymphs Abs: 1.1 10*3/uL (ref 0.7–4.0)
MCH: 29.7 pg (ref 26.0–34.0)
MCHC: 34 g/dL (ref 30.0–36.0)
MCV: 87.3 fL (ref 80.0–100.0)
Monocytes Absolute: 0.9 10*3/uL (ref 0.1–1.0)
Monocytes Relative: 9 %
Neutro Abs: 8 10*3/uL — ABNORMAL HIGH (ref 1.7–7.7)
Neutrophils Relative %: 79 %
Platelets: 359 10*3/uL (ref 150–400)
RBC: 3.94 MIL/uL (ref 3.87–5.11)
RDW: 12.9 % (ref 11.5–15.5)
WBC: 10.2 10*3/uL (ref 4.0–10.5)
nRBC: 0 % (ref 0.0–0.2)

## 2022-04-25 LAB — COMPREHENSIVE METABOLIC PANEL
ALT: 17 U/L (ref 0–44)
AST: 22 U/L (ref 15–41)
Albumin: 3.7 g/dL (ref 3.5–5.0)
Alkaline Phosphatase: 53 U/L (ref 38–126)
Anion gap: 11 (ref 5–15)
BUN: 24 mg/dL — ABNORMAL HIGH (ref 8–23)
CO2: 22 mmol/L (ref 22–32)
Calcium: 9.1 mg/dL (ref 8.9–10.3)
Chloride: 83 mmol/L — ABNORMAL LOW (ref 98–111)
Creatinine, Ser: 0.93 mg/dL (ref 0.44–1.00)
GFR, Estimated: >60 mL/min (ref >60)
Glucose, Bld: 151 mg/dL — ABNORMAL HIGH (ref 70–99)
Potassium: 5 mmol/L (ref 3.5–5.1)
Sodium: 116 mmol/L — CL (ref 135–145)
Total Bilirubin: 0.5 mg/dL (ref 0.3–1.2)
Total Protein: 6.2 g/dL — ABNORMAL LOW (ref 6.5–8.1)

## 2022-04-25 LAB — TROPONIN I (HIGH SENSITIVITY)
Troponin I (High Sensitivity): 7 ng/L (ref <18)
Troponin I (High Sensitivity): 5 ng/L (ref <18)

## 2022-04-25 LAB — LIPASE, BLOOD: Lipase: 101 U/L — ABNORMAL HIGH (ref 11–51)

## 2022-04-25 MED ORDER — SODIUM CHLORIDE 0.9 % IV SOLN: INTRAVENOUS | Status: DC

## 2022-04-25 NOTE — H&P (Signed)
History and Physical    Chief Complaint: Hyponatremia   HISTORY OF PRESENT ILLNESS: Sara Stafford is an 76 y.o. female comes to the emergency room with complaints of low sodium and generalized weakness, review of systems is negative and patient is a poor historian is not able to give it any specific symptom or presentation except for generalized weakness, family member at bedside. Patient is eating in the emergency room and is in no distress no weakness.  Patient has a history of hyponatremia.  Pt has  Past Medical History:  Diagnosis Date   Arthritis    COPD (chronic obstructive pulmonary disease) ([place])    no change with spiriva, poor tolerance of advair   Depressive disorder, not elsewhere classified    Diabetes mellitus    Fatigue    Fibromyalgia    GERD (gastroesophageal reflux disease)    Hyperlipidemia    Hypertension    Macular degeneration    Routine general medical examination at a health care facility    Review of Systems  Constitutional:  Positive for fatigue.  Neurological:  Positive for weakness.  All other systems reviewed and are negative.  Allergies  Allergen Reactions   Jardiance [Empagliflozin] Nausea And Vomiting   Ozempic (0.25 Or 0.5 Mg-Dose) [Semaglutide(0.25 Or 0.96m-Dos)] Nausea And Vomiting   Doxycycline     REACTION: thrush   Past Surgical History:  Procedure Laterality Date   ABDOMINAL HYSTERECTOMY     partial   CATARACT EXTRACTION Left 02/09/2015   CATARACT EXTRACTION Right 01/26/2015   CHOLECYSTECTOMY N/A 02/16/2022   Procedure: LAPAROSCOPIC CHOLECYSTECTOMY WITH INTRAOPERATIVE CHOLANGIOGRAM;  Surgeon: BRobert Bellow MD;  Location: [facility] ORS;  Service: General;  Laterality: N/A;Floyce Stakes RNFA to assist   COLONOSCOPY WITH PROPOFOL N/A 04/28/2021   Procedure: COLONOSCOPY WITH PROPOFOL;  Surgeon: BRobert Bellow MD;  Location: [facility];  Service: Endoscopy;  Laterality: N/A;   ESOPHAGOGASTRODUODENOSCOPY (EGD) WITH  PROPOFOL N/A 04/28/2021   Procedure: ESOPHAGOGASTRODUODENOSCOPY (EGD) WITH PROPOFOL;  Surgeon: BRobert Bellow MD;  Location: [facility] ENDOS[facility];  Service: Endoscopy;  Laterality: N/A;       CURRENT MEDS: Current Outpatient Medications  Medication Instructions   acetaminophen (TYLENOL) 650 mg, Oral, Daily   ALPRAZolam (XANAX) 0.25 mg, Oral, At bedtime PRN   azelastine (ASTELIN) 0.1 % nasal spray 1 spray, Each Nare, As needed, Use in each nostril as directed   Blood Glucose Monitoring Suppl (TRUE METRIX METER) w/Device KIT No dose, route, or frequency recorded.   cetirizine (ZYRTEC) 10 mg, Oral, Daily PRN   furosemide (LASIX) 20 mg, Daily   gabapentin (NEURONTIN) 100 mg, Oral, Daily at bedtime   glipiZIDE (GLUCOTROL XL) 10 MG 24 hr tablet TAKE (2) TABLETS BY MOUTH ONCE DAILY.   metFORMIN (GLUCOPHAGE-XR) 750 mg, Oral, Every evening   metoprolol tartrate (LOPRESSOR) 25 mg, Oral, 2 times daily   omeprazole (PRILOSEC) 20 MG capsule TAKE (1) CAPSULE BY MOUTH ONCE DAILY AS NEEDED.   pregabalin (LYRICA) 100 mg, At bedtime PRN   rosuvastatin (CRESTOR) 20 mg, Oral, Daily at bedtime   traMADol (ULTRAM) 50 mg, Oral, Every 6 hours PRN   TRUEplus Lancets 30G MISC No dose, route, or frequency recorded.   venlafaxine (EFFEXOR) 37.5 MG tablet TAKE 1 TABLET BY MOUTH TWICE DAILY   VENTOLIN HFA 108 (90 Base) MCG/ACT inhaler INHALE 2 PUFFS BY MOUTH EVERY 4 HOURS AS NEEDED   WIXELA INHUB 250-50 MCG/DOSE AEPB TAKE 1 PUFF BY MOUTH TWICE A DAY  heparin  5,000 Units Subcutaneous Q8H   insulin aspart  0-15 Units Subcutaneous TID WC   metoprolol tartrate  25 mg Oral BID   rosuvastatin  20 mg Oral QHS   sodium chloride flush  3 mL Intravenous Q12H     sodium chloride 50 mL/hr at 04/26/22 0037        ED Course: Pt in Ed alert awake oriented hypertensive O2 sats of 98% on room air.  Vitals:   04/25/22 1821 04/25/22 1822  BP: (!) 173/72   Pulse: 96   Resp: 18   Temp: 98.1 F (36.7 C)   TempSrc:  Oral   SpO2: 98%   Weight:  56.2 kg  Height:  _0  (1.651 m)    No intake/output data recorded. SpO2: 98 % Blood work in ed shows: Hyponatremia, hemoglobin of 11.7, Results for orders placed or performed during the hospital encounter of 04/25/22 (from the past 24 hour(s))  CBC with Differential     Status: Abnormal   Collection Time: 04/25/22  6:24 PM  Result Value Ref Range   WBC 10.2 4.0 - 10.5 K/uL   RBC 3.94 3.87 - 5.11 MIL/uL   Hemoglobin 11.7 (L) 12.0 - 15.0 g/dL   HCT 34.4 (L) 36.0 - 46.0 %   MCV 87.3 80.0 - 100.0 fL   MCH 29.7 26.0 - 34.0 pg   MCHC 34.0 30.0 - 36.0 g/dL   RDW 12.9 11.5 - 15.5 %   Platelets 359 150 - 400 K/uL   nRBC 0.0 0.0 - 0.2 %   Neutrophils Relative % 79 %   Neutro Abs 8.0 (H) 1.7 - 7.7 K/uL   Lymphocytes Relative 11 %   Lymphs Abs 1.1 0.7 - 4.0 K/uL   Monocytes Relative 9 %   Monocytes Absolute 0.9 0.1 - 1.0 K/uL   Eosinophils Relative 0 %   Eosinophils Absolute 0.0 0.0 - 0.5 K/uL   Basophils Relative 0 %   Basophils Absolute 0.0 0.0 - 0.1 K/uL   Immature Granulocytes 1 %   Abs Immature Granulocytes 0.07 0.00 - 0.07 K/uL  Comprehensive metabolic panel     Status: Abnormal   Collection Time: 04/25/22  6:24 PM  Result Value Ref Range   Sodium 116 (LL) 135 - 145 mmol/L   Potassium 5.0 3.5 - 5.1 mmol/L   Chloride 83 (L) 98 - 111 mmol/L   CO2 22 22 - 32 mmol/L   Glucose, Bld 151 (H) 70 - 99 mg/dL   BUN 24 (H) 8 - 23 mg/dL   Creatinine, Ser 0.93 0.44 - 1.00 mg/dL   Calcium 9.1 8.9 - 10.3 mg/dL   Total Protein 6.2 (L) 6.5 - 8.1 g/dL   Albumin 3.7 3.5 - 5.0 g/dL   AST 22 15 - 41 U/L   ALT 17 0 - 44 U/L   Alkaline Phosphatase 53 38 - 126 U/L   Total Bilirubin 0.5 0.3 - 1.2 mg/dL   GFR, Estimated >60 >60 mL/min   Anion gap 11 5 - 15  Lipase, blood     Status: Abnormal   Collection Time: 04/25/22  6:24 PM  Result Value Ref Range   Lipase 101 (H) 11 - 51 U/L  Troponin I (High Sensitivity)     Status: None   Collection Time: 04/25/22  6:24  PM  Result Value Ref Range   Troponin I (High Sensitivity) 7 <18 ng/L  Urinalysis, Routine w reflex microscopic Urine, Clean Catch     Status: Abnormal  Collection Time: 04/25/22  6:57 PM  Result Value Ref Range   Color, Urine YELLOW (A) YELLOW   APPearance HAZY (A) CLEAR   Specific Gravity, Urine 1.011 1.005 - 1.030   pH 5.0 5.0 - 8.0   Glucose, UA NEGATIVE NEGATIVE mg/dL   Hgb urine dipstick NEGATIVE NEGATIVE   Bilirubin Urine NEGATIVE NEGATIVE   Ketones, ur NEGATIVE NEGATIVE mg/dL   Protein, ur NEGATIVE NEGATIVE mg/dL   Nitrite NEGATIVE NEGATIVE   Leukocytes,Ua NEGATIVE NEGATIVE  Troponin I (High Sensitivity)     Status: None   Collection Time: 04/25/22 11:16 PM  Result Value Ref Range   Troponin I (High Sensitivity) 5 <18 ng/L    Unresulted Labs (From admission, onward)     Start     Ordered   04/26/22 0500  Comprehensive metabolic panel  Tomorrow morning,   STAT        04/26/22 0030   04/26/22 0500  CBC  Tomorrow morning,   STAT        04/26/22 0030   04/26/22 0500  Magnesium  Tomorrow morning,   R        04/26/22 0030   04/26/22 0036  Osmolality  Add-on,   AD        04/26/22 0035   04/26/22 0031  Magnesium  Add-on,   AD        04/26/22 0030   04/26/22 0030  TSH  Once,   URGENT        04/26/22 0030   04/26/22 0030  T4, free  Once,   URGENT        04/26/22 0030   04/26/22 0028  Hemoglobin A1c  Once,   URGENT       Comments: To assess prior glycemic control    04/26/22 0030           Pt has received : Orders Placed This Encounter  Procedures   1-3 Lead EKG Interpretation   DG Chest Port 1 View   CBC with Differential   Comprehensive metabolic panel   Lipase, blood   Urinalysis, Routine w reflex microscopic Urine, Clean Catch   Hemoglobin A1c   Comprehensive metabolic panel   CBC   TSH   T4, free   Magnesium   Magnesium   Osmolality   Diet heart healthy/carb modified Room service appropriate? Yes; Fluid consistency: Thin   Apply Diabetes Mellitus  Care Plan   STAT CBG when hypoglycemia is suspected. If treated, recheck every 15 minutes after each treatment until CBG >/= 70 mg/dl   Refer to Hypoglycemia Protocol Sidebar Report for treatment of CBG < 70 mg/dl   Maintain IV access   Vital signs   Notify physician (specify)   Progressive Mobility Protocol: No Restrictions   Daily weights   Intake and Output   Initiate Oral Care Protocol   Initiate Carrier Fluid Protocol   RN may order General Admission PRN Orders utilizing "General Admission PRN medications" (through manage orders) for the following patient needs: allergy symptoms (Claritin), cold sores (Carmex), cough (Robitussin DM), eye irritation (Liquifilm Tears), hemorrhoids (Tucks), indigestion (Maalox), minor skin irritation (Hydrocortisone Cream), muscle pain Suezanne Jacquet Gay), nose irritation (saline nasal spray) and sore throat (Chloraseptic spray).   No basal insulin at this time   Cardiac Monitoring Continuous x 48 hours Indications for use: Other; Other indications for use: generalized weakness   Full code   Consult to hospitalist   Pulse oximetry check with vital signs   Oxygen therapy Mode  or (Route): Nasal cannula; Liters Per Minute: 2; Keep 02 saturation: greater than 92 %   ED EKG   EKG 12-Lead   Place in observation (patient's expected length of stay will be less than 2 midnights)   Aspiration precautions   Fall precautions   Admission Imaging : No results found.  Physical Examination: Vitals:   04/25/22 1821 04/25/22 1822  BP: (!) 173/72   Pulse: 96   Temp: 98.1 F (36.7 C)   Resp: 18   Height:  _0  (1.651 m)  Weight:  56.2 kg  SpO2: 98%   TempSrc: Oral   BMI (Calculated):  20.63   Physical Exam Vitals and nursing note reviewed.  Constitutional:      General: She is not in acute distress.    Appearance: Normal appearance. She is not ill-appearing, toxic-appearing or diaphoretic.  HENT:     Head: Normocephalic and atraumatic.     Right Ear: Hearing and  external ear normal.     Left Ear: Hearing and external ear normal.     Nose: Nose normal. No nasal deformity.     Mouth/Throat:     Lips: Pink.     Mouth: Mucous membranes are moist.     Tongue: No lesions.     Pharynx: Oropharynx is clear.  Eyes:     Extraocular Movements: Extraocular movements intact.     Pupils: Pupils are equal, round, and reactive to light.  Neck:     Vascular: No carotid bruit.  Cardiovascular:     Rate and Rhythm: Normal rate and regular rhythm.     Pulses: Normal pulses.     Heart sounds: Normal heart sounds.  Pulmonary:     Effort: Pulmonary effort is normal.     Breath sounds: Normal breath sounds.  Abdominal:     General: Bowel sounds are normal. There is no distension.     Palpations: Abdomen is soft. There is no mass.     Tenderness: There is no abdominal tenderness. There is no guarding.     Hernia: No hernia is present.  Musculoskeletal:     Right lower leg: No edema.     Left lower leg: No edema.  Skin:    General: Skin is warm.  Neurological:     General: No focal deficit present.     Mental Status: She is alert and oriented to person, place, and time.     Cranial Nerves: Cranial nerves 2-12 are intact.     Motor: Motor function is intact.  Psychiatric:        Attention and Perception: Attention normal.        Mood and Affect: Mood normal.        Speech: Speech normal.        Behavior: Behavior normal. Behavior is cooperative.        Cognition and Memory: Cognition normal.     Assessment and Plan: * Hyponatremia Patient is a poor historian and reports generalized feeling of unwell with no specific symptoms and states that she thinks it is because of the low sodium.  Has no history of hyponatremia in the past states that she stopped taking diuretics on a while ago.  She was taking them for swelling in her legs.  Clinically patient is euvolemic and normotensive, in fact hypertensive. Will obtain a chest x-ray patient has a history of  smoking and could be SIADH. Will obtain a serum and urine osmolality. I will obtain a free T4 and  a TSH. Decrease normal saline to maintenance of 50 cc/h. Strict I's and O's. Aspiration fall. Avoid any diuretics.   Hypertension Vitals:   04/25/22 1821  BP: (!) 173/72  Patient has been hypertensive since arrival. States that primary care has taken her off of metoprolol. Blood pressure (!) 173/72, pulse 96, temperature 98.1 F (36.7 C), temperature source Oral, resp. rate 18, height _0  (1.651 m), weight 56.2 kg, SpO2 98 %. Vitals show tachycardia and hypertension. Will resume patient's metoprolol.   GAD (generalized anxiety disorder) Will continue patient on her venlafaxine at her current dose.  Esophagitis Patient has a history of esophagitis we will start patient on IV PPI therapy.   COPD GOLD II  Stable mild wheezing. Patient has a history of smoking for 40 years. Has stopped smoking for 15 years. As needed MDI.   Depression Again we will continue patient on Effexor or.   Diabetes ([place]) Home regimen includes glipizide metformin and no SGLT2 meds. Currently we will start patient on glycemic protocol and hold home medication.    DVT prophylaxis:  Heparin   Code Status:  Full Code   Family Communication:  Jeneen, Doutt (Spouse) [phone]    Disposition Plan:  Home   Consults called:  None  Admission status: Observation.   Unit/ Expected LOS: Med tele / 1 -2 days.    Para Skeans MD [facility]  6 PM- 2 AM. Please contact me via secure Chat 6 PM-2 AM. [phone]( Pager ) To contact the [facility] Attending or Consulting provider [place] or covering provider during after hours [place], for this patient.   Check the care team in [facility] and look for a) attending/consulting [facility] provider listed and b) the [facility] team listed Log into [email or web address] and use [place]'s universal password to access. If you do not have the password, please contact the  hospital operator. Locate the [facility] provider you are looking for under [facility] and page to a number that you can be directly reached. If you still have difficulty reaching the provider, please page the [facility] (Director on Call) for the Hospitalists listed on amion for assistance. [email or web address] 04/26/2022, 1:17 AM

## 2022-04-25 NOTE — ED Triage Notes (Signed)
Patient here due to Na on blood draw today was 117. Patient reports she has been feeling weird so she got her blood drawn today.  Patient feeling hot, tired and weak.  Denies CP SOB

## 2022-04-25 NOTE — ED Provider Notes (Signed)
[facility] Provider Note    Event Date/Time   First MD Initiated Contact with Patient 04/25/22 2302     (approximate)   History   Abnormal Lab   HPI  Sara Stafford is a 76 y.o. female with history of COPD, hypertension, diabetes, hyperlipidemia who presents to the emergency department with complaints of feeling weak.  Had blood work today and sodium was 117.  No vomiting or diarrhea.  Eating and drinking well with no excess water intake.  No pain, confusion.  States she is not on any diuretic.  Confirmed with recent home meds.   History provided by patient and family.    Past Medical History:  Diagnosis Date   Arthritis    COPD (chronic obstructive pulmonary disease) ([place])    no change with spiriva, poor tolerance of advair   Depressive disorder, not elsewhere classified    Diabetes mellitus    Fatigue    Fibromyalgia    GERD (gastroesophageal reflux disease)    Hyperlipidemia    Hypertension    Macular degeneration    Routine general medical examination at a health care facility     Past Surgical History:  Procedure Laterality Date   ABDOMINAL HYSTERECTOMY     partial   CATARACT EXTRACTION Left 02/09/2015   CATARACT EXTRACTION Right 01/26/2015   CHOLECYSTECTOMY N/A 02/16/2022   Procedure: LAPAROSCOPIC CHOLECYSTECTOMY WITH INTRAOPERATIVE CHOLANGIOGRAM;  Surgeon: Robert Bellow, MD;  Location: [facility] ORS;  Service: General;  Laterality: N/AFloyce Stakes, RNFA to assist   COLONOSCOPY WITH PROPOFOL N/A 04/28/2021   Procedure: COLONOSCOPY WITH PROPOFOL;  Surgeon: Robert Bellow, MD;  Location: [facility];  Service: Endoscopy;  Laterality: N/A;   ESOPHAGOGASTRODUODENOSCOPY (EGD) WITH PROPOFOL N/A 04/28/2021   Procedure: ESOPHAGOGASTRODUODENOSCOPY (EGD) WITH PROPOFOL;  Surgeon: Robert Bellow, MD;  Location: [facility];  Service: Endoscopy;  Laterality: N/A;    MEDICATIONS:  Prior to Admission medications    Medication Sig Start Date End Date Taking? Authorizing Provider  acetaminophen (TYLENOL) 325 MG tablet Take 650 mg by mouth daily.    [provider]  ALPRAZolam Duanne Moron) 0.25 MG tablet Take 0.25 mg by mouth at bedtime as needed for anxiety.    [provider]  azelastine (ASTELIN) 0.1 % nasal spray Place 1 spray into both nostrils as needed for rhinitis. Use in each nostril as directed    [provider]  Blood Glucose Monitoring Suppl (TRUE METRIX METER) w/Device KIT  06/08/18   [provider]  cetirizine (ZYRTEC) 10 MG tablet Take 10 mg by mouth daily as needed. 05/20/19   [provider]  furosemide (LASIX) 20 MG tablet Take 20 mg by mouth daily. Patient not taking: Reported on 02/10/2022 05/28/19   [provider]  gabapentin (NEURONTIN) 100 MG capsule Take 100 mg by mouth at bedtime.    [provider]  glipiZIDE (GLUCOTROL XL) 10 MG 24 hr tablet TAKE (2) TABLETS BY MOUTH ONCE DAILY. Patient taking differently: 10 mg 2 (two) times daily. 11/02/17   Pleas Koch, NP  metFORMIN (GLUCOPHAGE-XR) 750 MG 24 hr tablet Take 750 mg by mouth every evening.    [provider]  metoprolol tartrate (LOPRESSOR) 25 MG tablet Take 25 mg by mouth 2 (two) times daily. 04/30/20   [provider]  omeprazole (PRILOSEC) 20 MG capsule TAKE (1) CAPSULE BY MOUTH ONCE DAILY AS NEEDED. 08/01/17   Pleas Koch, NP  pregabalin (LYRICA) 100 MG capsule Take  100 mg by mouth at bedtime as needed. Patient not taking: Reported on 02/16/2022    [provider]  rosuvastatin (CRESTOR) 20 MG tablet Take 20 mg by mouth at bedtime. 04/02/20   [provider]  traMADol (ULTRAM) 50 MG tablet Take 1 tablet (50 mg total) by mouth every 6 (six) hours as needed. 02/16/22 02/16/23  Robert Bellow, MD  TRUEplus Lancets 30G MISC  06/08/18   [provider]  venlafaxine (EFFEXOR) 37.5 MG tablet TAKE 1 TABLET BY MOUTH TWICE  DAILY Patient taking differently: Take 18.75 mg by mouth at bedtime as needed. 08/28/17   Pleas Koch, NP  VENTOLIN HFA 108 (90 Base) MCG/ACT inhaler INHALE 2 PUFFS BY MOUTH EVERY 4 HOURS AS NEEDED 06/21/16   Lucille Passy, MD  WIXELA INHUB 250-50 MCG/DOSE AEPB TAKE 1 PUFF BY MOUTH TWICE A DAY 11/16/18   [provider]  omeprazole (PRILOSEC OTC) 20 MG tablet Take 1 tablet (20 mg total) by mouth daily as needed. 02/29/12 10/15/12  Lucille Passy, MD    Physical Exam   Triage Vital Signs: ED Triage Vitals  Enc Vitals Group     BP 04/25/22 1821 (!) 173/72     Pulse Rate 04/25/22 1821 96     Resp 04/25/22 1821 18     Temp 04/25/22 1821 98.1 F (36.7 C)     Temp Source 04/25/22 1821 Oral     SpO2 04/25/22 1821 98 %     Weight 04/25/22 1822 124 lb (56.2 kg)     Height 04/25/22 1822 _0  (1.651 m)     Head Circumference --      Peak Flow --      Pain Score 04/25/22 1822 0     Pain Loc --      Pain Edu? --      Excl. in [place]? --     Most recent vital signs: Vitals:   04/25/22 1821  BP: (!) 173/72  Pulse: 96  Resp: 18  Temp: 98.1 F (36.7 C)  SpO2: 98%    CONSTITUTIONAL: Alert and oriented and responds appropriately to questions. Well-appearing; well-nourished HEAD: Normocephalic, atraumatic EYES: Conjunctivae clear, pupils appear equal, sclera nonicteric ENT: normal nose; slightly dry mucous membranes NECK: Supple, normal ROM CARD: RRR; S1 and S2 appreciated; no murmurs, no clicks, no rubs, no gallops RESP: Normal chest excursion without splinting or tachypnea; breath sounds clear and equal bilaterally; no wheezes, no rhonchi, no rales, no hypoxia or respiratory distress, speaking full sentences ABD/GI: Normal bowel sounds; non-distended; soft, non-tender, no rebound, no guarding, no peritoneal signs BACK: The back appears normal EXT: Normal ROM in all joints; no deformity noted, no edema; no cyanosis SKIN: Normal color for age and race; warm; no rash on exposed  skin NEURO: Moves all extremities equally, normal speech PSYCH: The patient's mood and manner are appropriate.   ED Results / Procedures / Treatments   LABS: (all labs ordered are listed, but only abnormal results are displayed) Labs Reviewed  CBC WITH DIFFERENTIAL/PLATELET - Abnormal; Notable for the following components:      Result Value   Hemoglobin 11.7 (*)    HCT 34.4 (*)    Neutro Abs 8.0 (*)    All other components within normal limits  COMPREHENSIVE METABOLIC PANEL - Abnormal; Notable for the following components:   Sodium 116 (*)    Chloride 83 (*)    Glucose, Bld 151 (*)    BUN 24 (*)  Total Protein 6.2 (*)    All other components within normal limits  LIPASE, BLOOD - Abnormal; Notable for the following components:   Lipase 101 (*)    All other components within normal limits  URINALYSIS, ROUTINE W REFLEX MICROSCOPIC - Abnormal; Notable for the following components:   Color, Urine YELLOW (*)    APPearance HAZY (*)    All other components within normal limits  TROPONIN I (HIGH SENSITIVITY)  TROPONIN I (HIGH SENSITIVITY)     EKG:  EKG Interpretation  Date/Time:  Monday April 25 2022 18:30:16 EST Ventricular Rate:  101 PR Interval:  178 QRS Duration: 76 QT Interval:  320 QTC Calculation: 414 R Axis:   30 Text Interpretation: Sinus tachycardia Septal infarct (cited on or before 14-Jan-2021) Abnormal ECG When compared with ECG of 21-Jan-2022 08:01, No significant change was found Confirmed by Pryor Curia [phone]) on 04/25/2022 11:11:34 PM         RADIOLOGY: My personal review and interpretation of imaging:    I have personally reviewed all radiology reports.   No results found.   PROCEDURES:  Critical Care performed: Yes, see critical care procedure note(s)   CRITICAL CARE Performed by: Pryor Curia   Total critical care time: 45 minutes  Critical care time was exclusive of separately billable procedures and treating other  patients.  Critical care was necessary to treat or prevent imminent or life-threatening deterioration.  Critical care was time spent personally by me on the following activities: development of treatment plan with patient and/or surrogate as well as nursing, discussions with consultants, evaluation of patient's response to treatment, examination of patient, obtaining history from patient or surrogate, ordering and performing treatments and interventions, ordering and review of laboratory studies, ordering and review of radiographic studies, pulse oximetry and re-evaluation of patient's condition.   Marland Kitchen1-3 Lead EKG Interpretation  Performed by: Reyes Aldaco, Delice Bison, DO Authorized by: Renie Stelmach, Delice Bison, DO     Interpretation: normal     ECG rate:  96   ECG rate assessment: normal     Rhythm: sinus rhythm     Ectopy: none     Conduction: normal       IMPRESSION / MDM / ASSESSMENT AND PLAN / ED COURSE  I reviewed the triage vital signs and the nursing notes.    Patient here with generalized weakness, found to have sodium of 117 as an outpatient.  The patient is on the cardiac monitor to evaluate for evidence of arrhythmia and/or significant heart rate changes.   DIFFERENTIAL DIAGNOSIS (includes but not limited to):   Dehydration,   Patient's presentation is most consistent with acute presentation with potential threat to life or bodily function.   PLAN: Workup initiated from triage.  No leukocytosis, mild anemia, urine is unremarkable.  Sodium of 116.  Chloride also slightly decreased to 83.  BUN elevated but creatinine normal.  Troponins negative.  EKG nonischemic.  Will initiate gentle IV fluid hydration with saline as she does have some dry mucous membranes and does not appear volume overloaded.  Will admit to the hospitalist for hyponatremia.   MEDICATIONS GIVEN IN ED: Medications  0.9 %  sodium chloride infusion (has no administration in time range)     ED COURSE:  Consulted and  discussed patient's case with hospitalist, Dr. Posey Pronto.  I have recommended admission and consulting physician agrees and will place admission orders.  Patient (and family if present) agree with this plan.   I reviewed all nursing notes, vitals,  pertinent previous records.  All labs, EKGs, imaging ordered have been independently reviewed and interpreted by myself.       OUTSIDE RECORDS REVIEWED: Reviewed recent PCP notes.       FINAL CLINICAL IMPRESSION(S) / ED DIAGNOSES   Final diagnoses:  Hyponatremia     Rx / DC Orders   ED Discharge Orders     None        Note:  This document was prepared using Dragon voice recognition software and may include unintentional dictation errors.   Marcelle Bebout, Delice Bison, DO 04/25/22 2351

## 2022-04-25 NOTE — ED Provider Triage Note (Signed)
Emergency Medicine Provider Triage Evaluation Note  Sara Stafford , a 76 y.o. female  was evaluated in triage.  Pt complains of weakness and "feeling weird for the past 2 to 3 days.  Patient had basic labs conducted by her primary care provider which indicated hyperkalemia and hyponatremia with a blood glucose in the 200s.  Patient denies chest pain or abdominal pain.  No vomiting or diarrhea.  She been afebrile at home.  Patient was recently started on an antidepressant.  Review of Systems  Positive: Patient feels weak.  Negative: No chest pain or abdominal pain.   Physical Exam  BP (!) 173/72   Pulse 96   Temp 98.1 F (36.7 C) (Oral)   Resp 18   Ht 5\' 5"  (1.651 m)   Wt 56.2 kg   SpO2 98%   BMI 20.63 kg/m  Gen:   Awake, no distress   Resp:  Normal effort  MSK:   Moves extremities without difficulty  Other:    Medical Decision Making  Medically screening exam initiated at 6:23 PM.  Appropriate orders placed.  BRIANY AYE was informed that the remainder of the evaluation will be completed by another provider, this initial triage assessment does not replace that evaluation, and the importance of remaining in the ED until their evaluation is complete.     Tobi Bastos [place], [place] 04/25/22 1824

## 2022-04-26 ENCOUNTER — Observation Stay: Payer: Medicare Other

## 2022-04-26 DIAGNOSIS — E222 Syndrome of inappropriate secretion of antidiuretic hormone: Secondary | ICD-10-CM | POA: Diagnosis present

## 2022-04-26 DIAGNOSIS — Z885 Allergy status to narcotic agent status: Secondary | ICD-10-CM | POA: Diagnosis not present

## 2022-04-26 DIAGNOSIS — E861 Hypovolemia: Secondary | ICD-10-CM | POA: Diagnosis present

## 2022-04-26 DIAGNOSIS — M797 Fibromyalgia: Secondary | ICD-10-CM | POA: Diagnosis present

## 2022-04-26 DIAGNOSIS — K219 Gastro-esophageal reflux disease without esophagitis: Secondary | ICD-10-CM | POA: Diagnosis present

## 2022-04-26 DIAGNOSIS — I10 Essential (primary) hypertension: Secondary | ICD-10-CM | POA: Diagnosis present

## 2022-04-26 DIAGNOSIS — E119 Type 2 diabetes mellitus without complications: Secondary | ICD-10-CM | POA: Diagnosis present

## 2022-04-26 DIAGNOSIS — Z7984 Long term (current) use of oral hypoglycemic drugs: Secondary | ICD-10-CM | POA: Diagnosis not present

## 2022-04-26 DIAGNOSIS — J449 Chronic obstructive pulmonary disease, unspecified: Secondary | ICD-10-CM | POA: Diagnosis present

## 2022-04-26 DIAGNOSIS — E785 Hyperlipidemia, unspecified: Secondary | ICD-10-CM | POA: Diagnosis present

## 2022-04-26 DIAGNOSIS — F411 Generalized anxiety disorder: Secondary | ICD-10-CM | POA: Diagnosis present

## 2022-04-26 DIAGNOSIS — Z888 Allergy status to other drugs, medicaments and biological substances status: Secondary | ICD-10-CM | POA: Diagnosis not present

## 2022-04-26 DIAGNOSIS — Z79899 Other long term (current) drug therapy: Secondary | ICD-10-CM | POA: Diagnosis not present

## 2022-04-26 DIAGNOSIS — H3530 Unspecified macular degeneration: Secondary | ICD-10-CM | POA: Diagnosis present

## 2022-04-26 DIAGNOSIS — Z881 Allergy status to other antibiotic agents status: Secondary | ICD-10-CM | POA: Diagnosis not present

## 2022-04-26 DIAGNOSIS — E871 Hypo-osmolality and hyponatremia: Principal | ICD-10-CM | POA: Diagnosis present

## 2022-04-26 DIAGNOSIS — Z87891 Personal history of nicotine dependence: Secondary | ICD-10-CM | POA: Diagnosis not present

## 2022-04-26 DIAGNOSIS — F32A Depression, unspecified: Secondary | ICD-10-CM | POA: Diagnosis present

## 2022-04-26 LAB — COMPREHENSIVE METABOLIC PANEL
ALT: 18 U/L (ref 0–44)
AST: 22 U/L (ref 15–41)
Albumin: 3.9 g/dL (ref 3.5–5.0)
Alkaline Phosphatase: 60 U/L (ref 38–126)
Anion gap: 7 (ref 5–15)
BUN: 20 mg/dL (ref 8–23)
CO2: 28 mmol/L (ref 22–32)
Calcium: 9.3 mg/dL (ref 8.9–10.3)
Chloride: 84 mmol/L — ABNORMAL LOW (ref 98–111)
Creatinine, Ser: 0.64 mg/dL (ref 0.44–1.00)
GFR, Estimated: >60 mL/min (ref >60)
Glucose, Bld: 157 mg/dL — ABNORMAL HIGH (ref 70–99)
Potassium: 4.4 mmol/L (ref 3.5–5.1)
Sodium: 119 mmol/L — CL (ref 135–145)
Total Bilirubin: 0.6 mg/dL (ref 0.3–1.2)
Total Protein: 6.7 g/dL (ref 6.5–8.1)

## 2022-04-26 LAB — SODIUM, URINE, RANDOM: Sodium, Ur: 28 mmol/L

## 2022-04-26 LAB — CBC
HCT: 34.4 % — ABNORMAL LOW (ref 36.0–46.0)
Hemoglobin: 12.3 g/dL (ref 12.0–15.0)
MCH: 30.3 pg (ref 26.0–34.0)
MCHC: 35.8 g/dL (ref 30.0–36.0)
MCV: 84.7 fL (ref 80.0–100.0)
Platelets: 403 10*3/uL — ABNORMAL HIGH (ref 150–400)
RBC: 4.06 MIL/uL (ref 3.87–5.11)
RDW: 12.8 % (ref 11.5–15.5)
WBC: 10.7 10*3/uL — ABNORMAL HIGH (ref 4.0–10.5)
nRBC: 0 % (ref 0.0–0.2)

## 2022-04-26 LAB — SODIUM
Sodium: 120 mmol/L — ABNORMAL LOW (ref 135–145)
Sodium: 121 mmol/L — ABNORMAL LOW (ref 135–145)
Sodium: 123 mmol/L — ABNORMAL LOW (ref 135–145)

## 2022-04-26 LAB — MAGNESIUM
Magnesium: 1.5 mg/dL — ABNORMAL LOW (ref 1.7–2.4)
Magnesium: 1.6 mg/dL — ABNORMAL LOW (ref 1.7–2.4)

## 2022-04-26 LAB — OSMOLALITY, URINE: Osmolality, Ur: 339 mOsm/kg (ref 300–900)

## 2022-04-26 LAB — CBG MONITORING, ED
Glucose-Capillary: 177 mg/dL — ABNORMAL HIGH (ref 70–99)
Glucose-Capillary: 211 mg/dL — ABNORMAL HIGH (ref 70–99)
Glucose-Capillary: 89 mg/dL (ref 70–99)

## 2022-04-26 LAB — OSMOLALITY: Osmolality: 257 mOsm/kg — ABNORMAL LOW (ref 275–295)

## 2022-04-26 LAB — URIC ACID: Uric Acid, Serum: 3.1 mg/dL (ref 2.5–7.1)

## 2022-04-26 LAB — TSH: TSH: 1.798 u[IU]/mL (ref 0.350–4.500)

## 2022-04-26 LAB — T4, FREE: Free T4: 1.25 ng/dL — ABNORMAL HIGH (ref 0.61–1.12)

## 2022-04-26 LAB — CORTISOL: Cortisol, Plasma: 28.3 ug/dL

## 2022-04-26 LAB — GLUCOSE, CAPILLARY: Glucose-Capillary: 157 mg/dL — ABNORMAL HIGH (ref 70–99)

## 2022-04-26 MED ORDER — AZELASTINE HCL 0.1 % NA SOLN: 1.0000 | Freq: Two times a day (BID) | NASAL | Status: DC | PRN

## 2022-04-26 MED ORDER — METOPROLOL TARTRATE 25 MG PO TABS
25.0000 mg | ORAL_TABLET | Freq: Two times a day (BID) | ORAL | Status: DC
  Administered 2022-04-26 – 2022-04-28 (×6): 25 mg via ORAL
  Filled 2022-04-26 (×6): qty 1

## 2022-04-26 MED ORDER — INSULIN ASPART 100 UNIT/ML IJ SOLN
0.0000 [IU] | Freq: Three times a day (TID) | INTRAMUSCULAR | Status: DC
  Administered 2022-04-26: 5 [IU] via SUBCUTANEOUS
  Administered 2022-04-27 – 2022-04-28 (×4): 3 [IU] via SUBCUTANEOUS
  Administered 2022-04-28: 8 [IU] via SUBCUTANEOUS
  Filled 2022-04-26 (×6): qty 1

## 2022-04-26 MED ORDER — HEPARIN SODIUM (PORCINE) 5000 UNIT/ML IJ SOLN
5000.0000 [IU] | Freq: Three times a day (TID) | INTRAMUSCULAR | Status: DC
  Administered 2022-04-26 – 2022-04-28 (×7): 5000 [IU] via SUBCUTANEOUS
  Filled 2022-04-26 (×7): qty 1

## 2022-04-26 MED ORDER — SODIUM CHLORIDE 0.9 % IV SOLN: INTRAVENOUS | Status: DC

## 2022-04-26 MED ORDER — ACETAMINOPHEN 325 MG PO TABS: 650.0000 mg | ORAL_TABLET | Freq: Four times a day (QID) | ORAL | Status: DC | PRN

## 2022-04-26 MED ORDER — HYDRALAZINE HCL 20 MG/ML IJ SOLN: 10.0000 mg | Freq: Four times a day (QID) | INTRAMUSCULAR | Status: DC | PRN

## 2022-04-26 MED ORDER — ALBUTEROL SULFATE HFA 108 (90 BASE) MCG/ACT IN AERS: 2.0000 | INHALATION_SPRAY | RESPIRATORY_TRACT | Status: DC | PRN

## 2022-04-26 MED ORDER — ALPRAZOLAM 0.25 MG PO TABS
0.2500 mg | ORAL_TABLET | Freq: Every evening | ORAL | Status: DC | PRN
  Administered 2022-04-26 – 2022-04-27 (×2): 0.25 mg via ORAL
  Filled 2022-04-26 (×2): qty 1

## 2022-04-26 MED ORDER — MAGNESIUM SULFATE 2 GM/50ML IV SOLN
2.0000 g | Freq: Once | INTRAVENOUS | Status: AC
  Administered 2022-04-26: 2 g via INTRAVENOUS
  Filled 2022-04-26: qty 50

## 2022-04-26 MED ORDER — ACETAMINOPHEN 650 MG RE SUPP: 650.0000 mg | Freq: Four times a day (QID) | RECTAL | Status: DC | PRN

## 2022-04-26 MED ORDER — AMLODIPINE BESYLATE 10 MG PO TABS
10.0000 mg | ORAL_TABLET | Freq: Every day | ORAL | Status: DC
  Administered 2022-04-26 – 2022-04-28 (×3): 10 mg via ORAL
  Filled 2022-04-26 (×3): qty 1

## 2022-04-26 MED ORDER — MORPHINE SULFATE (PF) 2 MG/ML IV SOLN: 2.0000 mg | INTRAVENOUS | Status: DC | PRN

## 2022-04-26 MED ORDER — ROSUVASTATIN CALCIUM 20 MG PO TABS
20.0000 mg | ORAL_TABLET | Freq: Every day | ORAL | Status: DC
  Administered 2022-04-26 – 2022-04-27 (×2): 20 mg via ORAL
  Filled 2022-04-26 (×2): qty 1

## 2022-04-26 MED ORDER — ALBUTEROL SULFATE (2.5 MG/3ML) 0.083% IN NEBU: 2.5000 mg | INHALATION_SOLUTION | RESPIRATORY_TRACT | Status: DC | PRN

## 2022-04-26 MED ORDER — ACETAMINOPHEN 325 MG PO TABS: 650.0000 mg | ORAL_TABLET | Freq: Every day | ORAL | Status: DC

## 2022-04-26 MED ORDER — HYDROCODONE-ACETAMINOPHEN 5-325 MG PO TABS: 1.0000 | ORAL_TABLET | ORAL | Status: DC | PRN

## 2022-04-26 MED ORDER — SODIUM CHLORIDE 0.9% FLUSH
3.0000 mL | Freq: Two times a day (BID) | INTRAVENOUS | Status: DC
  Administered 2022-04-26 – 2022-04-28 (×5): 3 mL via INTRAVENOUS

## 2022-04-26 NOTE — Progress Notes (Addendum)
PROGRESS NOTE  Sara Stafford  ZOX:[id number] DOB: 03-Aug-1945 DOA: 04/25/2022 PCP: Danella Penton, MD   Brief Narrative: Patient is a 76 year old female with history of COPD, depression, diabetes type 2, fibromyalgia, hypertension, hyperlipidemia, smoker who presented to the emergency department with complaints of generalized weakness and low sodium as per the blood work done on the day of admission as an outpatient.  No history of vomiting, diarrhea.she said she had a cholecystectomy few months ago and has low appetite since then .on presentation, she was hemodynamically stable.  Lab work showed a sodium of 116.  Patient was admitted for further management.  Assessment & Plan:  Principal Problem:   Hyponatremia Active Problems:   Diabetes (HCC)   Depression   COPD GOLD II    Esophagitis   GAD (generalized anxiety disorder)   Hypertension  Hyponatremia: Unclear etiology.  Renal function normal.  Patient responding to gentle IV fluid with normal saline.  Likely this is hypovolemic hyponatremia .  SIADH panel obtained.  Urine sodium 28.  Monitor sodium every 6 hours.  Avoid overcorrection of more than 8 meq in 24 hours.  Hypertension: Hypertensive on presentation.  She was recently taken off from metoprolol by her PCP.  Home metoprolol resumed.added amlod 10 mg  GAD/depression: Previously on venlafaxine but her PCP has stopped it.  Continue holding.  Continue Xanax as needed  History of esophagitis: Continue PPI  COPD/smoker: Past smoker.Takes inhalers at home.  Continue bronchodilators.  Chest x-ray does not show any pneumonia or lung mass.  History of hyperlipidemia: On Crestor  Hypomagnesemia: Supplemented with magnesium  Diabetes type 2: Takes glipizide, metformin at home.  Continue current insulin regimen.  Monitor blood sugars.A1c pending  Generalized weakness: We will get PT consultation        DVT prophylaxis:heparin injection 5,000 Units Start: 04/26/22  0200     Code Status: Full Code  Family Communication: None at bedside, treatment plan discussed with the patient  Patient status:Inpatient  Patient is from :Home  Anticipated discharge WJ:XBJY  Estimated DC date:1-2 days   Consultants: None  Procedures:None  Antimicrobials:  Anti-infectives (From admission, onward)    None       Subjective: Patient seen and examined at bedside this morning in the emergency department.  During my evaluations, she was comfortable, hemodynamically stable, alert and oriented.  She states her weakness is improving and she feels better than she came.  Denies any new complaints.  No nausea, vomiting or abdominal pain.  Objective: Vitals:   04/25/22 1822 04/26/22 0000 04/26/22 0100 04/26/22 0455  BP:  (!) 184/78 (!) 173/77 (!) 152/79  Pulse:  87 95 79  Resp:  (!) 21 (!) 23 17  Temp:      TempSrc:      SpO2:  99% 100% 98%  Weight: 56.2 kg     Height: 5\' 5"  (1.651 m)      No intake or output data in the 24 hours ending 04/26/22 0752 Filed Weights   04/25/22 1822  Weight: 56.2 kg    Examination:  General exam: Overall comfortable, not in distress,pleasant elderly female HEENT: PERRL Respiratory system:  no wheezes or crackles  Cardiovascular system: S1 & S2 heard, RRR.  Gastrointestinal system: Abdomen is nondistended, soft and nontender. Central nervous system: Alert and oriented Extremities: No edema, no clubbing ,no cyanosis Skin: No rashes, no ulcers,no icterus     Data Reviewed: I have personally reviewed following labs and imaging studies  CBC: Recent Labs  Lab 04/25/22 1824 04/26/22 0621  WBC 10.2 10.7*  NEUTROABS 8.0*  --   HGB 11.7* 12.3  HCT 34.4* 34.4*  MCV 87.3 84.7  PLT 359 403*   Basic Metabolic Panel: Recent Labs  Lab 04/25/22 1824 04/26/22 0621  NA 116* 119*  K 5.0 4.4  CL 83* 84*  CO2 22 28  GLUCOSE 151* 157*  BUN 24* 20  CREATININE 0.93 0.64  CALCIUM 9.1 9.3  MG  --  1.6*  1.5*      No results found for this or any previous visit (from the past 240 hour(s)).   Radiology Studies: DG Chest Port 1 View  Result Date: 04/26/2022 CLINICAL DATA:  Hyponatremia EXAM: PORTABLE CHEST 1 VIEW COMPARISON:  04/30/2014 FINDINGS: Cardiac shadow is within normal limits. Aortic calcifications are again seen. The lungs are clear. No bony abnormality is noted. IMPRESSION: No active disease. Electronically Signed   By: Alcide Clever M.D.   On: 04/26/2022 01:17    Scheduled Meds:  heparin  5,000 Units Subcutaneous Q8H   insulin aspart  0-15 Units Subcutaneous TID WC   metoprolol tartrate  25 mg Oral BID   rosuvastatin  20 mg Oral QHS   sodium chloride flush  3 mL Intravenous Q12H   Continuous Infusions:  sodium chloride 50 mL/hr at 04/26/22 0037     LOS: 0 days   Burnadette Pop, MD [facility] P11/28/2023, 7:52 AM  PROGRESS NOTE  Sara Stafford  OAC:[id number] DOB: 02-17-1946 DOA: 04/25/2022 PCP: Danella Penton, MD   Brief Narrative:   Assessment & Plan:  Principal Problem:   Hyponatremia Active Problems:   Diabetes (HCC)   Depression   COPD GOLD II    Esophagitis   GAD (generalized anxiety disorder)   Hypertension           DVT prophylaxis:heparin injection 5,000 Units Start: 04/26/22 0200     Code Status: Full Code  Family Communication:   Patient status:  Patient is from :  Anticipated discharge to:  Estimated DC date:   Consultants:   Procedures:  Antimicrobials:  Anti-infectives (From admission, onward)    None       Subjective:   Objective: Vitals:   04/25/22 1822 04/26/22 0000 04/26/22 0100 04/26/22 0455  BP:  (!) 184/78 (!) 173/77 (!) 152/79  Pulse:  87 95 79  Resp:  (!) 21 (!) 23 17  Temp:      TempSrc:      SpO2:  99% 100% 98%  Weight: 56.2 kg     Height: 5\' 5"  (1.651 m)      No intake or output data in the 24 hours ending 04/26/22 0753 Filed Weights   04/25/22 1822  Weight: 56.2 kg     Examination:  General exam: Overall comfortable, not in distress HEENT: PERRL Respiratory system:  no wheezes or crackles  Cardiovascular system: S1 & S2 heard, RRR.  Gastrointestinal system: Abdomen is nondistended, soft and nontender. Central nervous system: Alert and oriented Extremities: No edema, no clubbing ,no cyanosis Skin: No rashes, no ulcers,no icterus     Data Reviewed: I have personally reviewed following labs and imaging studies  CBC: Recent Labs  Lab 04/25/22 1824 04/26/22 0621  WBC 10.2 10.7*  NEUTROABS 8.0*  --   HGB 11.7* 12.3  HCT 34.4* 34.4*  MCV 87.3 84.7  PLT 359 403*   Basic Metabolic Panel: Recent Labs  Lab 04/25/22 1824 04/26/22 0621  NA 116* 119*  K 5.0 4.4  CL 83* 84*  CO2 22 28  GLUCOSE 151* 157*  BUN 24* 20  CREATININE 0.93 0.64  CALCIUM 9.1 9.3  MG  --  1.6*  1.5*     No results found for this or any previous visit (from the past 240 hour(s)).   Radiology Studies: DG Chest Port 1 View  Result Date: 04/26/2022 CLINICAL DATA:  Hyponatremia EXAM: PORTABLE CHEST 1 VIEW COMPARISON:  04/30/2014 FINDINGS: Cardiac shadow is within normal limits. Aortic calcifications are again seen. The lungs are clear. No bony abnormality is noted. IMPRESSION: No active disease. Electronically Signed   By: Alcide Clever M.D.   On: 04/26/2022 01:17    Scheduled Meds:  heparin  5,000 Units Subcutaneous Q8H   insulin aspart  0-15 Units Subcutaneous TID WC   metoprolol tartrate  25 mg Oral BID   rosuvastatin  20 mg Oral QHS   sodium chloride flush  3 mL Intravenous Q12H   Continuous Infusions:  sodium chloride 50 mL/hr at 04/26/22 0037     LOS: 0 days   Burnadette Pop, MD [facility] P11/28/2023, 7:53 AM

## 2022-04-26 NOTE — Assessment & Plan Note (Addendum)
Stable mild wheezing. Patient has a history of smoking for 40 years. Has stopped smoking for 15 years. As needed MDI.

## 2022-04-26 NOTE — Assessment & Plan Note (Signed)
Home regimen includes glipizide metformin and no SGLT2 meds. Currently we will start patient on glycemic protocol and hold home medication.

## 2022-04-26 NOTE — Assessment & Plan Note (Signed)
Patient has a history of esophagitis we will start patient on IV PPI therapy.

## 2022-04-26 NOTE — Evaluation (Signed)
Physical Therapy Evaluation Patient Details Name: Sara Stafford MRN: [id number] DOB: 1945/09/29 Today's Date: 04/26/2022  History of Present Illness  Pt is an 76 y.o. female who presented to the emergency room with complaints of low sodium and generalized weakness.  MD assessment includes: Hyponatremia, HTN, hypomagnesemia, and generalized weakness.  PMH includes: depression, DM, macular degeneration, fibromyalgia, HLD, and COPD.   Clinical Impression  Pt was pleasant and motivated to participate during the session and put forth good effort throughout. Pt required no physical assistance with bed mobility, transfers, or gait but did report feeling weaker and less steady on her feet compared to baseline.  Pt ambulated both with and without an AD with noted min instability during amb without an AD that the pt was able to self-correct.  Pt self-selected to utilize RW for majority of the session secondary to increased stability with the device.  No adverse symptoms reported during the session with SpO2 and HR WNL on room air.  Pt will benefit from HHPT upon discharge to safely address deficits listed in patient problem list for decreased caregiver assistance and eventual return to PLOF.         Recommendations for follow up therapy are one component of a multi-disciplinary discharge planning process, led by the attending physician.  Recommendations may be updated based on patient status, additional functional criteria and insurance authorization.  Follow Up Recommendations Home health PT      Assistance Recommended at Discharge Intermittent Supervision/Assistance  Patient can return home with the following  A little help with walking and/or transfers;Assist for transportation;Help with stairs or ramp for entrance    Equipment Recommendations Rolling walker (2 wheels)  Recommendations for Other Services       Functional Status Assessment Patient has had a recent decline in their  functional status and demonstrates the ability to make significant improvements in function in a reasonable and predictable amount of time.     Precautions / Restrictions Precautions Precautions: Fall Restrictions Weight Bearing Restrictions: No      Mobility  Bed Mobility Overal bed mobility: Modified Independent             General bed mobility comments: Min extra time and effort only    Transfers Overall transfer level: Needs assistance Equipment used: Rolling walker (2 wheels), None Transfers: Sit to/from Stand Sit to Stand: Supervision, From elevated surface           General transfer comment: Good control and stability during sit to/from stands to elevated surface    Ambulation/Gait Ambulation/Gait assistance: Min guard Gait Distance (Feet): 150 Feet Assistive device: Rolling walker (2 wheels), None Gait Pattern/deviations: Step-through pattern, Decreased step length - right, Decreased step length - left Gait velocity: decreased     General Gait Details: Min instability during ambulation without an AD that the pt was able to self correct, improved significantly with use of the RW  Stairs            Wheelchair Mobility    Modified Rankin (Stroke Patients Only)       Balance Overall balance assessment: Needs assistance   Sitting balance-Leahy Scale: Normal     Standing balance support: Bilateral upper extremity supported, During functional activity Standing balance-Leahy Scale: Good                               Pertinent Vitals/Pain Pain Assessment Pain Assessment: No/denies pain    Home Living  Family/patient expects to be discharged to:: Private residence Living Arrangements: Spouse/significant other Available Help at Discharge: Family;Available 24 hours/day Type of Home: House Home Access: Stairs to enter Entrance Stairs-Rails: Right;Left (too wide for both) Entrance Stairs-Number of Steps: 4   Home Layout: One  level Home Equipment: Cane - single point;Crutches;Wheelchair - manual;Grab bars - tub/shower;Shower seat - built in      Prior Function Prior Level of Function : Independent/Modified Independent             Mobility Comments: Ind amb community distances without an AD, no fall history ADLs Comments: Ind with ADLs     Hand Dominance        Extremity/Trunk Assessment   Upper Extremity Assessment Upper Extremity Assessment: Overall WFL for tasks assessed    Lower Extremity Assessment Lower Extremity Assessment: Generalized weakness       Communication   Communication: No difficulties  Cognition Arousal/Alertness: Awake/alert Behavior During Therapy: WFL for tasks assessed/performed Overall Cognitive Status: Within Functional Limits for tasks assessed                                          General Comments      Exercises Total Joint Exercises Ankle Circles/Pumps: AROM, Strengthening, Both, 10 reps Quad Sets: Strengthening, Both, 10 reps Gluteal Sets: Strengthening, Both, 10 reps Heel Slides: Strengthening, Both, 10 reps Straight Leg Raises: Strengthening, Both, 10 reps Long Arc Quad: Strengthening, Both, 10 reps Bridges: Strengthening, Both, 5 reps   Assessment/Plan    PT Assessment Patient needs continued PT services  PT Problem List Decreased strength;Decreased activity tolerance;Decreased balance;Decreased mobility;Decreased knowledge of use of DME       PT Treatment Interventions DME instruction;Stair training;Functional mobility training;Therapeutic activities;Therapeutic exercise;Gait training;Balance training;Patient/family education    PT Goals (Current goals can be found in the Care Plan section)  Acute Rehab PT Goals Patient Stated Goal: Improved strength and balance PT Goal Formulation: With patient Time For Goal Achievement: 05/09/22 Potential to Achieve Goals: Good    Frequency Min 2X/week     Co-evaluation                AM-PAC PT "6 Clicks" Mobility  Outcome Measure Help needed turning from your back to your side while in a flat bed without using bedrails?: None Help needed moving from lying on your back to sitting on the side of a flat bed without using bedrails?: None Help needed moving to and from a bed to a chair (including a wheelchair)?: A Little Help needed standing up from a chair using your arms (e.g., wheelchair or bedside chair)?: A Little Help needed to walk in hospital room?: A Little Help needed climbing 3-5 steps with a railing? : A Little 6 Click Score: 20    End of Session Equipment Utilized During Treatment: Gait belt Activity Tolerance: Patient tolerated treatment well Patient left: in bed;with nursing/sitter in room;Other (comment) (Pt in ED hallway next to nursing station) Nurse Communication: Mobility status PT Visit Diagnosis: Unsteadiness on feet (R26.81);Difficulty in walking, not elsewhere classified (R26.2);Muscle weakness (generalized) (M62.81)    Time: 0263-7858 PT Time Calculation (min) (ACUTE ONLY): 23 min   Charges:   PT Evaluation $PT Eval Moderate Complexity: 1 Mod PT Treatments $Therapeutic Exercise: 8-22 mins       D. Elly Modena PT, DPT 04/26/22, 3:36 PM

## 2022-04-26 NOTE — Assessment & Plan Note (Signed)
Vitals:   04/25/22 1821  BP: (!) 173/72  Patient has been hypertensive since arrival. States that primary care has taken her off of metoprolol. Blood pressure (!) 173/72, pulse 96, temperature 98.1 F (36.7 C), temperature source Oral, resp. rate 18, height 5\' 5"  (1.651 m), weight 56.2 kg, SpO2 98 %. Vitals show tachycardia and hypertension. Will resume patient's metoprolol.

## 2022-04-26 NOTE — Assessment & Plan Note (Signed)
Again we will continue patient on Effexor or.

## 2022-04-26 NOTE — Progress Notes (Signed)
Patient arrived from ER in stable condition. Ambulated with steady gait to bathroom from stretcher.

## 2022-04-26 NOTE — ED Notes (Signed)
Dr. Willia Craze made aware of critical NA level of 119

## 2022-04-26 NOTE — Assessment & Plan Note (Signed)
Patient is a poor historian and reports generalized feeling of unwell with no specific symptoms and states that she thinks it is because of the low sodium.  Has no history of hyponatremia in the past states that she stopped taking diuretics on a while ago.  She was taking them for swelling in her legs.  Clinically patient is euvolemic and normotensive, in fact hypertensive. Will obtain a chest x-ray patient has a history of smoking and could be SIADH. Will obtain a serum and urine osmolality. I will obtain a free T4 and a TSH. Decrease normal saline to maintenance of 50 cc/h. Strict I's and O's. Aspiration fall. Avoid any diuretics.

## 2022-04-26 NOTE — Assessment & Plan Note (Signed)
Will continue patient on her venlafaxine at her current dose.

## 2022-04-27 LAB — BASIC METABOLIC PANEL
Anion gap: 5 (ref 5–15)
Anion gap: 8 (ref 5–15)
BUN: 13 mg/dL (ref 8–23)
BUN: 13 mg/dL (ref 8–23)
CO2: 24 mmol/L (ref 22–32)
CO2: 26 mmol/L (ref 22–32)
Calcium: 9.1 mg/dL (ref 8.9–10.3)
Calcium: 8.8 mg/dL — ABNORMAL LOW (ref 8.9–10.3)
Chloride: 92 mmol/L — ABNORMAL LOW (ref 98–111)
Chloride: 90 mmol/L — ABNORMAL LOW (ref 98–111)
Creatinine, Ser: 0.73 mg/dL (ref 0.44–1.00)
Creatinine, Ser: 0.95 mg/dL (ref 0.44–1.00)
GFR, Estimated: >60 mL/min (ref >60)
GFR, Estimated: >60 mL/min (ref >60)
Glucose, Bld: 153 mg/dL — ABNORMAL HIGH (ref 70–99)
Glucose, Bld: 283 mg/dL — ABNORMAL HIGH (ref 70–99)
Potassium: 4.8 mmol/L (ref 3.5–5.1)
Potassium: 5 mmol/L (ref 3.5–5.1)
Sodium: 121 mmol/L — ABNORMAL LOW (ref 135–145)
Sodium: 124 mmol/L — ABNORMAL LOW (ref 135–145)

## 2022-04-27 LAB — HEPATIC FUNCTION PANEL
ALT: 18 U/L (ref 0–44)
AST: 20 U/L (ref 15–41)
Albumin: 3.5 g/dL (ref 3.5–5.0)
Alkaline Phosphatase: 63 U/L (ref 38–126)
Bilirubin, Direct: <0.1 mg/dL (ref 0.0–0.2)
Total Bilirubin: 0.6 mg/dL (ref 0.3–1.2)
Total Protein: 6.2 g/dL — ABNORMAL LOW (ref 6.5–8.1)

## 2022-04-27 LAB — GLUCOSE, CAPILLARY
Glucose-Capillary: 152 mg/dL — ABNORMAL HIGH (ref 70–99)
Glucose-Capillary: 158 mg/dL — ABNORMAL HIGH (ref 70–99)
Glucose-Capillary: 154 mg/dL — ABNORMAL HIGH (ref 70–99)
Glucose-Capillary: 200 mg/dL — ABNORMAL HIGH (ref 70–99)
Glucose-Capillary: 123 mg/dL — ABNORMAL HIGH (ref 70–99)

## 2022-04-27 LAB — MAGNESIUM: Magnesium: 1.7 mg/dL (ref 1.7–2.4)

## 2022-04-27 LAB — SODIUM
Sodium: 124 mmol/L — ABNORMAL LOW (ref 135–145)
Sodium: 132 mmol/L — ABNORMAL LOW (ref 135–145)

## 2022-04-27 LAB — HEMOGLOBIN A1C
Hgb A1c MFr Bld: 9 % — ABNORMAL HIGH (ref 4.8–5.6)
Mean Plasma Glucose: 212 mg/dL

## 2022-04-27 MED ORDER — TOLVAPTAN 15 MG PO TABS
15.0000 mg | ORAL_TABLET | Freq: Once | ORAL | Status: AC
  Administered 2022-04-27: 15 mg via ORAL
  Filled 2022-04-27: qty 1

## 2022-04-27 MED ORDER — GUAIFENESIN-DM 100-10 MG/5ML PO SYRP
5.0000 mL | ORAL_SOLUTION | ORAL | Status: DC | PRN
  Administered 2022-04-27 – 2022-04-28 (×4): 5 mL via ORAL
  Filled 2022-04-27 (×4): qty 10

## 2022-04-27 NOTE — Consult Note (Signed)
[facility]  CONSULT NOTE    Date: 04/27/2022                  Patient Name:  Sara Stafford  MRN: [id number]  DOB: Dec 24, 1945  Age / Sex: 76 y.o., female         PCP: Rusty Aus, MD                 Service Requesting Consult: [facility]                 Reason for Consult: Hyponatremia            History of Present Illness: Ms. Sara Stafford is a 76 y.o.  female with past melanite that 1 medical history including diabetes, fibromyalgia, GERD, hypertension, arthritis, macular degeneration, and hyperlipidemia, who was admitted to [facility] on 04/25/2022 for Hyponatremia [E87.1]  Patient is seen and evaluated at bedside.  No family at bedside.  Alert and oriented.  Patient states she has been experiencing weakness, fatigue, and dizziness prior to admission.  Patient states she recently had her gallbladder removed 2 months prior and appetite has remained poor since that time.  Patient states she has maintained oral intake of water and tea to remain hydrated.  Chart review states family also noticed altered mental status which was noted on ED arrival.  Denies previous history of hyponatremia.  Sodium on ED arrival 116.  Urine sodium 28 with osmolality 339.  Chest x-ray negative for acute findings.  IV fluids started in emergency department.  Medications: Outpatient medications: Medications Prior to Admission  Medication Sig Dispense Refill Last Dose   ALPRAZolam (XANAX) 0.25 MG tablet Take 0.25 mg by mouth at bedtime as needed for anxiety.   prn at prn   azelastine (ASTELIN) 0.1 % nasal spray Place 1 spray into both nostrils as needed for rhinitis. Use in each nostril as directed   prn at prn   cyanocobalamin (VITAMIN B12) 500 MCG tablet Take 500 mcg by mouth daily.   04/25/2022 at 0930   gabapentin (NEURONTIN) 100 MG capsule Take 100 mg by mouth 2 (two) times daily.   04/24/2022 at 2230   glipiZIDE (GLUCOTROL XL) 10 MG 24 hr tablet TAKE (2) TABLETS BY MOUTH  ONCE DAILY. (Patient taking differently: 10 mg 2 (two) times daily.) 60 tablet 0 04/25/2022 at 0930   metFORMIN (GLUCOPHAGE-XR) 750 MG 24 hr tablet Take 750 mg by mouth every evening.   04/24/2022 at 2230   Multiple Vitamin (MULTIVITAMIN WITH MINERALS) TABS tablet Take 1 tablet by mouth daily.   04/25/2022 at 0930   omeprazole (PRILOSEC) 20 MG capsule TAKE (1) CAPSULE BY MOUTH ONCE DAILY AS NEEDED. 30 capsule 5 prn at prn   pioglitazone (ACTOS) 15 MG tablet Take 15 mg by mouth daily.   04/25/2022 at 0930   rosuvastatin (CRESTOR) 20 MG tablet Take 20 mg by mouth at bedtime.   04/24/2022 at 2230   VENTOLIN HFA 108 (90 Base) MCG/ACT inhaler INHALE 2 PUFFS BY MOUTH EVERY 4 HOURS AS NEEDED 18 each 5 prn at prn   WIXELA INHUB 250-50 MCG/DOSE AEPB TAKE 1 PUFF BY MOUTH TWICE A DAY   04/25/2022 at 0930   acetaminophen (TYLENOL) 325 MG tablet Take 650 mg by mouth daily. (Patient not taking: Reported on 04/26/2022)   Not Taking   Blood Glucose Monitoring Suppl (TRUE METRIX METER) w/Device KIT       cetirizine (ZYRTEC) 10 MG tablet Take 10  mg by mouth daily as needed. (Patient not taking: Reported on 04/26/2022)   Not Taking   furosemide (LASIX) 20 MG tablet Take 20 mg by mouth daily. (Patient not taking: Reported on 02/10/2022)   Not Taking   meloxicam (MOBIC) 7.5 MG tablet Take 7.5 mg by mouth daily. (Patient not taking: Reported on 04/26/2022)   Not Taking   metoprolol tartrate (LOPRESSOR) 25 MG tablet Take 25 mg by mouth 2 (two) times daily. (Patient not taking: Reported on 04/26/2022)   Not Taking   pregabalin (LYRICA) 100 MG capsule Take 100 mg by mouth at bedtime as needed. (Patient not taking: Reported on 02/16/2022)   Not Taking   traMADol (ULTRAM) 50 MG tablet Take 1 tablet (50 mg total) by mouth every 6 (six) hours as needed. (Patient not taking: Reported on 04/26/2022) 10 tablet 0 Not Taking   TRUEplus Lancets 30G MISC       venlafaxine (EFFEXOR) 37.5 MG tablet TAKE 1 TABLET BY MOUTH TWICE DAILY  (Patient not taking: Reported on 04/26/2022) 60 tablet 0 Not Taking    Current medications: Current Facility-Administered Medications  Medication Dose Route Frequency Provider Last Rate Last Admin   0.9 %  sodium chloride infusion   Intravenous Continuous Adhikari, Amrit, MD 100 mL/hr at 04/27/22 1029 New Bag at 04/27/22 1029   acetaminophen (TYLENOL) tablet 650 mg  650 mg Oral Q6H PRN Para Skeans, MD       Or   acetaminophen (TYLENOL) suppository 650 mg  650 mg Rectal Q6H PRN Para Skeans, MD       albuterol (PROVENTIL) (2.5 MG/3ML) 0.083% nebulizer solution 2.5 mg  2.5 mg Nebulization Q4H PRN Renda Rolls, RPH       ALPRAZolam Duanne Moron) tablet 0.25 mg  0.25 mg Oral QHS PRN Florina Ou V, MD   0.25 mg at 04/26/22 2211   amLODipine (NORVASC) tablet 10 mg  10 mg Oral Daily Shelly Coss, MD   10 mg at 04/27/22 0846   azelastine (ASTELIN) 0.1 % nasal spray 1 spray  1 spray Each Nare BID PRN Para Skeans, MD       guaiFENesin-dextromethorphan (ROBITUSSIN DM) 100-10 MG/5ML syrup 5 mL  5 mL Oral Q4H PRN Shelly Coss, MD   5 mL at 04/27/22 1238   heparin injection 5,000 Units  5,000 Units Subcutaneous Q8H Florina Ou V, MD   5,000 Units at 04/27/22 1356   hydrALAZINE (APRESOLINE) injection 10 mg  10 mg Intravenous Q6H PRN Para Skeans, MD       HYDROcodone-acetaminophen (NORCO/VICODIN) 5-325 MG per tablet 1 tablet  1 tablet Oral Q4H PRN Para Skeans, MD       insulin aspart (novoLOG) injection 0-15 Units  0-15 Units Subcutaneous TID WC Para Skeans, MD   3 Units at 04/27/22 1249   metoprolol tartrate (LOPRESSOR) tablet 25 mg  25 mg Oral BID Para Skeans, MD   25 mg at 04/27/22 0846   morphine (PF) 2 MG/ML injection 2 mg  2 mg Intravenous Q4H PRN Para Skeans, MD       rosuvastatin (CRESTOR) tablet 20 mg  20 mg Oral QHS Florina Ou V, MD   20 mg at 04/26/22 2211   sodium chloride flush (NS) 0.9 % injection 3 mL  3 mL Intravenous Q12H Para Skeans, MD   3 mL at 04/27/22 0850       Allergies: Allergies  Allergen Reactions   Empagliflozin Nausea And Vomiting and  Nausea Only   Ozempic (0.25 Or 0.5 Mg-Dose) [Semaglutide(0.25 Or 0.29m-Dos)] Nausea And Vomiting   Semaglutide Nausea Only   Codeine Nausea And Vomiting   Doxycycline Other (See Comments)    REACTION: thrush  thrush      Past Medical History: Past Medical History:  Diagnosis Date   Arthritis    COPD (chronic obstructive pulmonary disease) (HCC)    no change with spiriva, poor tolerance of advair   Depressive disorder, not elsewhere classified    Diabetes mellitus    Fatigue    Fibromyalgia    GERD (gastroesophageal reflux disease)    Hyperlipidemia    Hypertension    Macular degeneration    Routine general medical examination at a health care facility      Past Surgical History: Past Surgical History:  Procedure Laterality Date   ABDOMINAL HYSTERECTOMY     partial   CATARACT EXTRACTION Left 02/09/2015   CATARACT EXTRACTION Right 01/26/2015   CHOLECYSTECTOMY N/A 02/16/2022   Procedure: LAPAROSCOPIC CHOLECYSTECTOMY WITH INTRAOPERATIVE CHOLANGIOGRAM;  Surgeon: BRobert Bellow MD;  Location: [facility] ORS;  Service: General;  Laterality: N/A;Floyce Stakes RNFA to assist   COLONOSCOPY WITH PROPOFOL N/A 04/28/2021   Procedure: COLONOSCOPY WITH PROPOFOL;  Surgeon: BRobert Bellow MD;  Location: [facility] ENDOSCOPY;  Service: Endoscopy;  Laterality: N/A;   ESOPHAGOGASTRODUODENOSCOPY (EGD) WITH PROPOFOL N/A 04/28/2021   Procedure: ESOPHAGOGASTRODUODENOSCOPY (EGD) WITH PROPOFOL;  Surgeon: BRobert Bellow MD;  Location: [facility] ENDOSCOPY;  Service: Endoscopy;  Laterality: N/A;     Family History: Family History  Problem Relation Age of Onset   Stroke Father 859  Diabetes Father    Asthma Father    Liver cancer Sister 831  Pancreatic cancer Sister 739  Brain cancer Brother    Diabetes Sister    Diabetes Sister    Diabetes Sister    Diabetes Sister    Diabetes Sister    Diabetes  Sister    Diabetes Sister    Diabetes Paternal Grandmother    Breast cancer Maternal Aunt 70     Social History: Social History   Socioeconomic History   Marital status: Married    Spouse name: Not on file   Number of children: 2   Years of education: Not on file   Highest education level: Not on file  Occupational History   Occupation: retired    EF[employer] farm manager RETIRED    Comment: [employer]  Tobacco Use   Smoking status: Former    Packs/day: 1.00    Years: 50.00    Total pack years: 50.00    Types: Cigarettes    Quit date: 06/06/2010    Years since quitting: 11.8   Smokeless tobacco: Never  Vaping Use   Vaping Use: Never used  Substance and Sexual Activity   Alcohol use: No    Alcohol/week: 0.0 standard drinks of alcohol   Drug use: No   Sexual activity: Never  Other Topics Concern   Not on file  Social History Narrative   Children aged 31 and 31 expecting 2nd grandchild   Social Determinants of Health   Financial Resource Strain: Not on file  Food Insecurity: No Food Insecurity (04/26/2022)   Hunger Vital Sign    Worried About Running Out of Food in the Last Year: Never true    Ran Out of Food in the Last Year: Never true  Transportation Needs: No Transportation Needs (04/26/2022)   PRAPARE - Transportation    Lack of  Transportation (Medical): No    Lack of Transportation (Non-Medical): No  Physical Activity: Not on file  Stress: Not on file  Social Connections: Not on file  Intimate Partner Violence: Not At Risk (04/26/2022)   Humiliation, Afraid, Rape, and Kick questionnaire    Fear of Current or Ex-Partner: No    Emotionally Abused: No    Physically Abused: No    Sexually Abused: No     Review of Systems: Review of Systems  Constitutional:  Positive for malaise/fatigue. Negative for chills and fever.  HENT:  Negative for congestion, sore throat and tinnitus.   Eyes:  Negative for blurred vision and redness.  Respiratory:  Negative for cough,  shortness of breath and wheezing.   Cardiovascular:  Negative for chest pain, palpitations, claudication and leg swelling.  Gastrointestinal:  Negative for abdominal pain, blood in stool, diarrhea, nausea and vomiting.  Genitourinary:  Negative for flank pain, frequency and hematuria.  Musculoskeletal:  Negative for back pain, falls and myalgias.  Skin:  Negative for rash.  Neurological:  Positive for dizziness and weakness. Negative for headaches.  Endo/Heme/Allergies:  Does not bruise/bleed easily.  Psychiatric/Behavioral:  Negative for depression. The patient is not nervous/anxious and does not have insomnia.     Vital Signs: Blood pressure (!) 144/67, pulse 81, temperature 98.6 F (37 C), resp. rate 16, height _0  (1.651 m), weight 56.2 kg, SpO2 98 %.  Weight trends: Danley Danker Weights   04/25/22 1822  Weight: 56.2 kg    Physical Exam: General: NAD  Head: Normocephalic, atraumatic. Moist oral mucosal membranes  Eyes: Anicteric  Lungs:  Clear to auscultation, normal effort, room air  Heart: Regular rate and rhythm  Abdomen:  Soft, nontender, nondistended  Extremities: No peripheral edema.  Neurologic: Nonfocal, moving all four extremities  Skin: No lesions  Access: None     Lab results: Basic Metabolic Panel: Recent Labs  Lab 04/25/22 1824 04/26/22 0621 04/26/22 1104 04/26/22 2148 04/27/22 0419 04/27/22 1000  NA 116* 119*   < > 123* 121* 124*  K 5.0 4.4  --   --  4.8  --   CL 83* 84*  --   --  92*  --   CO2 22 28  --   --  24  --   GLUCOSE 151* 157*  --   --  153*  --   BUN 24* 20  --   --  13  --   CREATININE 0.93 0.64  --   --  0.73  --   CALCIUM 9.1 9.3  --   --  9.1  --   MG  --  1.6*  1.5*  --   --  1.7  --    < > = values in this interval not displayed.    Liver Function Tests: Recent Labs  Lab 04/25/22 1824 04/26/22 0621  AST 22 22  ALT 17 18  ALKPHOS 53 60  BILITOT 0.5 0.6  PROT 6.2* 6.7  ALBUMIN 3.7 3.9   Recent Labs  Lab 04/25/22 1824   LIPASE 101*   No results for input(s): "AMMONIA" in the last 168 hours.  CBC: Recent Labs  Lab 04/25/22 1824 04/26/22 0621  WBC 10.2 10.7*  NEUTROABS 8.0*  --   HGB 11.7* 12.3  HCT 34.4* 34.4*  MCV 87.3 84.7  PLT 359 403*    Cardiac Enzymes: No results for input(s): "CKTOTAL", "CKMB", "CKMBINDEX", "TROPONINI" in the last 168 hours.  BNP: Invalid input(s): "POCBNP"  CBG:  Recent Labs  Lab 04/26/22 1219 04/26/22 1940 04/27/22 0253 04/27/22 0740 04/27/22 1235  GLUCAP 89 157* 152* 158* 154*    Microbiology: Results for orders placed or performed in visit on 01/26/21  Microscopic Examination     Status: Abnormal   Collection Time: 01/26/21  1:09 PM   Urine  Result Value Ref Range Status   WBC, UA 0-5 0 - 5 /hpf Final   RBC, Urine 0-2 0 - 2 /hpf Final   Epithelial Cells (non renal) 0-10 0 - 10 /hpf Final   Casts Present (A) None seen /lpf Final   Cast Type Hyaline casts N/A Final   Bacteria, UA Few None seen/Few Final    Coagulation Studies: No results for input(s): "LABPROT", "INR" in the last 72 hours.  Urinalysis: Recent Labs    04/25/22 1857  COLORURINE YELLOW*  LABSPEC 1.011  PHURINE 5.0  GLUCOSEU NEGATIVE  HGBUR NEGATIVE  BILIRUBINUR NEGATIVE  KETONESUR NEGATIVE  PROTEINUR NEGATIVE  NITRITE NEGATIVE  LEUKOCYTESUR NEGATIVE      Imaging: DG Chest Port 1 View  Result Date: 04/26/2022 CLINICAL DATA:  Hyponatremia EXAM: PORTABLE CHEST 1 VIEW COMPARISON:  04/30/2014 FINDINGS: Cardiac shadow is within normal limits. Aortic calcifications are again seen. The lungs are clear. No bony abnormality is noted. IMPRESSION: No active disease. Electronically Signed   By: Inez Catalina M.D.   On: 04/26/2022 01:17     Assessment & Plan: Ms. Sara Stafford is a 76 y.o.  female with past melanite that 1 medical history including diabetes, fibromyalgia, GERD, hypertension, arthritis, macular degeneration, and hyperlipidemia, who was admitted to [facility] on  04/25/2022 for Hyponatremia [E87.1]   Hyponatremia likely due to poor oral intake with increased free water intake.  Sodium on admission 116.  Initially treated with normal saline however sodium stalled at 124.  SIADH component noted.  Patient has no liver history, will stop IV fluids and will treat with tolvaptan 15 mg once with close monitoring of sodium.  2.  Hypertension, essential.  Currently prescribed amlodipine and metoprolol.  3.  Diabetes mellitus, non-insulin-dependent.  Home regimen includes metformin and glipizide.  Hemoglobin A1c 9.0 on 04/26/2022.  LOS: 1 Alroy Portela 11/29/20232:10 PM

## 2022-04-27 NOTE — Progress Notes (Signed)
MEDICATION RELATED CONSULT NOTE - INITIAL   Pharmacy Consult for tolvaptan Indication: hyponatremia   Allergies  Allergen Reactions   Empagliflozin Nausea And Vomiting and Nausea Only   Ozempic (0.25 Or 0.5 Mg-Dose) [Semaglutide(0.25 Or 0.5mg -Dos)] Nausea And Vomiting   Semaglutide Nausea Only   Codeine Nausea And Vomiting   Doxycycline Other (See Comments)    REACTION: thrush  thrush    Patient Measurements: Height: 5\' 5"  (165.1 cm) Weight: 56.2 kg (124 lb) IBW/kg (Calculated) : 57  Vital Signs: Temp: 98.6 F (37 C) (11/29 1234) Temp Source: Oral (11/29 0527) BP: 144/67 (11/29 1234) Pulse Rate: 81 (11/29 1234) Intake/Output from previous day: 11/28 0701 - 11/29 0700 In: 2350 [P.O.:560; I.V.:1790] Out: 2 [Urine:2] Intake/Output from this shift: No intake/output data recorded.  Labs: Recent Labs    04/25/22 1824 04/26/22 0621 04/27/22 0419  WBC 10.2 10.7*  --   HGB 11.7* 12.3  --   HCT 34.4* 34.4*  --   PLT 359 403*  --   CREATININE 0.93 0.64 0.73  MG  --  1.6*  1.5* 1.7  ALBUMIN 3.7 3.9  --   PROT 6.2* 6.7  --   AST 22 22  --   ALT 17 18  --   ALKPHOS 53 60  --   BILITOT 0.5 0.6  --    Estimated Creatinine Clearance: 51.4 mL/min (by C-G formula based on SCr of 0.73 mg/dL).   Microbiology: No results found for this or any previous visit (from the past 720 hour(s)).  Medical History: Past Medical History:  Diagnosis Date   Arthritis    COPD (chronic obstructive pulmonary disease) (HCC)    no change with spiriva, poor tolerance of advair   Depressive disorder, not elsewhere classified    Diabetes mellitus    Fatigue    Fibromyalgia    GERD (gastroesophageal reflux disease)    Hyperlipidemia    Hypertension    Macular degeneration    Routine general medical examination at a health care facility     Medications:  NS @ 100 mL/hr  Assessment: 76 year old female PMH COPD, depression, T2DM, fibromyalgia, HTN, HLD admitted with hyponatremia. Na  slightly improved from 121, then declined again to 121 on 11/29 despite 0.9% sodium chloride @100  mL/hr. Pharmacy has been consulted for tolvaptan.  CrCl 51.4 mL/hr   Goal of Therapy:  Desired rate of correction: should not exceed 8 mEq/L per 8 hours or 12 mEq/L in 24 hours.   Plan: Tolvaptan 15 mg x 1 ordered Follow up Na every 8 hours x 24 hours after dose administration, then daily Contact provider if serum sodium anytime during the first 24 hours rises >12 mEq/L from baseline.   12/29 04/27/2022,2:40 PM

## 2022-04-27 NOTE — Progress Notes (Signed)
PROGRESS NOTE  Sara Stafford  JEH:[id number] DOB: 01-12-1946 DOA: 04/25/2022 PCP: Danella Penton, MD   Brief Narrative: Patient is a 76 year old female with history of COPD, depression, diabetes type 2, fibromyalgia, hypertension, hyperlipidemia, smoker who presented to the emergency department with complaints of generalized weakness and low sodium as per the blood work done on the day of admission as an outpatient.  No history of vomiting, diarrhea.she said she had a cholecystectomy few months ago and has low appetite since then .on presentation, she was hemodynamically stable.  Lab work showed a sodium of 116.  Patient was admitted for further management.  Assessment & Plan:  Principal Problem:   Hyponatremia Active Problems:   Diabetes (HCC)   Depression   COPD GOLD II    Esophagitis   GAD (generalized anxiety disorder)   Hypertension  Hyponatremia: Unclear etiology.  Renal function normal.  Patient responding to gentle IV fluid with normal saline.  Likely this is hypovolemic hyponatremia .  SIADH panel obtained.  Urine sodium 28.  Monitor sodium every 6 hours.  Avoid overcorrection of more than 8 meq in 24 hours. Na is 124 today, I still think that this is hypovolemic hyponatremia and will continue IV fluids.  I have also requested for nephrology assistance.  Hypertension: Hypertensive on presentation.  She was recently taken off from metoprolol by her PCP.  Home metoprolol resumed.added amlod 10 mg.  Normotensive this morning  GAD/depression: Previously on venlafaxine but her PCP has stopped it.  Continue holding.  Continue Xanax as needed  History of esophagitis: Continue PPI  COPD/smoker: Past smoker.Takes inhalers at home.  Continue bronchodilators.  Chest x-ray does not show any pneumonia or lung mass.  History of hyperlipidemia: On Crestor  Hypomagnesemia: Supplemented with magnesium  Diabetes type 2: Takes glipizide, metformin at home.  Continue current insulin  regimen.  Monitor blood sugars.A1c of 9  Generalized weakness: HH recommended as per  PT consultation        DVT prophylaxis:heparin injection 5,000 Units Start: 04/26/22 0200     Code Status: Full Code  Family Communication: None at bedside, treatment plan discussed with the patient  Patient status:Inpatient  Patient is from :Home  Anticipated discharge VZ:CHYI  Estimated DC date:1-2 days, awaiting further improvement in the kidney function   Consultants: None  Procedures:None  Antimicrobials:  Anti-infectives (From admission, onward)    None       Subjective: Patient seen and examined at bedside today.  Hemodynamically stable.  Appears comfortable.  She feels great.  Weakness has improved.  She also had a bowel movement.  Objective: Vitals:   04/27/22 0117 04/27/22 0527 04/27/22 0846 04/27/22 1234  BP: 113/65 132/65 (!) 160/70 (!) 144/67  Pulse: 90 79 (!) 101 81  Resp: 18 17 16 16   Temp: 98.1 F (36.7 C) 98 F (36.7 C) 98.3 F (36.8 C) 98.6 F (37 C)  TempSrc: Oral Oral    SpO2: 95% 96% 95% 98%  Weight:      Height:        Intake/Output Summary (Last 24 hours) at 04/27/2022 1327 Last data filed at 04/27/2022 0303 Gross per 24 hour  Intake 2350 ml  Output 2 ml  Net 2348 ml   Filed Weights   04/25/22 1822  Weight: 56.2 kg    Examination:  General exam: Overall comfortable, not in distress,pleasant elderly female HEENT: PERRL Respiratory system:  no wheezes or crackles  Cardiovascular system: S1 & S2 heard, RRR.  Gastrointestinal system:  Abdomen is nondistended, soft and nontender. Central nervous system: Alert and oriented Extremities: No edema, no clubbing ,no cyanosis Skin: No rashes, no ulcers,no icterus     Data Reviewed: I have personally reviewed following labs and imaging studies  CBC: Recent Labs  Lab 04/25/22 1824 04/26/22 0621  WBC 10.2 10.7*  NEUTROABS 8.0*  --   HGB 11.7* 12.3  HCT 34.4* 34.4*  MCV 87.3 84.7  PLT  359 [id number]*   Basic Metabolic Panel: Recent Labs  Lab 04/25/22 1824 04/26/22 0621 04/26/22 1104 04/26/22 1557 04/26/22 2148 04/27/22 0419 04/27/22 1000  NA 116* 119* 120* 121* 123* 121* 124*  K 5.0 4.4  --   --   --  4.8  --   CL 83* 84*  --   --   --  92*  --   CO2 22 28  --   --   --  24  --   GLUCOSE 151* 157*  --   --   --  153*  --   BUN 24* 20  --   --   --  13  --   CREATININE 0.93 0.64  --   --   --  0.73  --   CALCIUM 9.1 9.3  --   --   --  9.1  --   MG  --  1.6*  1.5*  --   --   --  1.7  --      No results found for this or any previous visit (from the past 240 hour(s)).   Radiology Studies: DG Chest Port 1 View  Result Date: 04/26/2022 CLINICAL DATA:  Hyponatremia EXAM: PORTABLE CHEST 1 VIEW COMPARISON:  04/30/2014 FINDINGS: Cardiac shadow is within normal limits. Aortic calcifications are again seen. The lungs are clear. No bony abnormality is noted. IMPRESSION: No active disease. Electronically Signed   By: Inez Catalina M.D.   On: 04/26/2022 01:17    Scheduled Meds:  amLODipine  10 mg Oral Daily   heparin  5,000 Units Subcutaneous Q8H   insulin aspart  0-15 Units Subcutaneous TID WC   metoprolol tartrate  25 mg Oral BID   rosuvastatin  20 mg Oral QHS   sodium chloride flush  3 mL Intravenous Q12H   Continuous Infusions:  sodium chloride 100 mL/hr at 04/27/22 1029     LOS: 1 day   Shelly Coss, MD [facility] P11/29/2023, 1:27 PM  PROGRESS NOTE  [name]  [id number] DOB: 1945/06/30 DOA: 04/25/2022 PCP: Rusty Aus, MD   Brief Narrative:   Assessment & Plan:  Principal Problem:   Hyponatremia Active Problems:   Diabetes ([place])   Depression   COPD GOLD II    Esophagitis   GAD (generalized anxiety disorder)   Hypertension           DVT prophylaxis:heparin injection 5,000 Units Start: 04/26/22 0200     Code Status: Full Code  Family Communication:   Patient status:  Patient is from :  Anticipated  discharge to:  Estimated DC date:   Consultants:   Procedures:  Antimicrobials:  Anti-infectives (From admission, onward)    None       Subjective:   Objective: Vitals:   04/27/22 0117 04/27/22 0527 04/27/22 0846 04/27/22 1234  BP: 113/65 132/65 (!) 160/70 (!) 144/67  Pulse: 90 79 (!) 101 81  Resp: 18 17 16 16   Temp: 98.1 F (36.7 C) 98 F (36.7 C) 98.3 F (36.8 C) 98.6 F (37  C)  TempSrc: Oral Oral    SpO2: 95% 96% 95% 98%  Weight:      Height:        Intake/Output Summary (Last 24 hours) at 04/27/2022 1327 Last data filed at 04/27/2022 0303 Gross per 24 hour  Intake 2350 ml  Output 2 ml  Net 2348 ml   Filed Weights   04/25/22 1822  Weight: 56.2 kg    Examination:  General exam: Overall comfortable, not in distress HEENT: PERRL Respiratory system:  no wheezes or crackles  Cardiovascular system: S1 & S2 heard, RRR.  Gastrointestinal system: Abdomen is nondistended, soft and nontender. Central nervous system: Alert and oriented Extremities: No edema, no clubbing ,no cyanosis Skin: No rashes, no ulcers,no icterus     Data Reviewed: I have personally reviewed following labs and imaging studies  CBC: Recent Labs  Lab 04/25/22 1824 04/26/22 0621  WBC 10.2 10.7*  NEUTROABS 8.0*  --   HGB 11.7* 12.3  HCT 34.4* 34.4*  MCV 87.3 84.7  PLT 359 Q000111Q*   Basic Metabolic Panel: Recent Labs  Lab 04/25/22 1824 04/26/22 0621 04/26/22 1104 04/26/22 1557 04/26/22 2148 04/27/22 0419 04/27/22 1000  NA 116* 119* 120* 121* 123* 121* 124*  K 5.0 4.4  --   --   --  4.8  --   CL 83* 84*  --   --   --  92*  --   CO2 22 28  --   --   --  24  --   GLUCOSE 151* 157*  --   --   --  153*  --   BUN 24* 20  --   --   --  13  --   CREATININE 0.93 0.64  --   --   --  0.73  --   CALCIUM 9.1 9.3  --   --   --  9.1  --   MG  --  1.6*  1.5*  --   --   --  1.7  --      No results found for this or any previous visit (from the past 240 hour(s)).   Radiology  Studies: DG Chest Port 1 View  Result Date: 04/26/2022 CLINICAL DATA:  Hyponatremia EXAM: PORTABLE CHEST 1 VIEW COMPARISON:  04/30/2014 FINDINGS: Cardiac shadow is within normal limits. Aortic calcifications are again seen. The lungs are clear. No bony abnormality is noted. IMPRESSION: No active disease. Electronically Signed   By: Inez Catalina M.D.   On: 04/26/2022 01:17    Scheduled Meds:  amLODipine  10 mg Oral Daily   heparin  5,000 Units Subcutaneous Q8H   insulin aspart  0-15 Units Subcutaneous TID WC   metoprolol tartrate  25 mg Oral BID   rosuvastatin  20 mg Oral QHS   sodium chloride flush  3 mL Intravenous Q12H   Continuous Infusions:  sodium chloride 100 mL/hr at 04/27/22 1029     LOS: 1 day   Shelly Coss, MD [facility] P11/29/2023, 1:27 PM

## 2022-04-27 NOTE — Plan of Care (Signed)

## 2022-04-27 NOTE — Progress Notes (Signed)
Physical Therapy Treatment Patient Details Name: Sara Stafford MRN: [id number] DOB: [birth year] Today's Date: 04/27/2022   History of Present Illness Pt is an 76 y.o. female who presented to the emergency room with complaints of low sodium and generalized weakness.  MD assessment includes: Hyponatremia, HTN, hypomagnesemia, and generalized weakness.  PMH includes: depression, DM, macular degeneration, fibromyalgia, HLD, and COPD.    PT Comments    Pt was pleasant and motivated to participate during the session and put forth good effort throughout. Pt required no physical assistance with bed mobility and was steady with good control during sit to/from stand transfers from various height surfaces.  Pt ambulated without an AD this session with slow cadence and short B step length but was steady without LOB with SpO2 and HR WNL on room air.  Pt participated in dynamic and static standing balance training per below with only occasional min instability during the more challenging exercises such as eyes closed in semi-tandem and reaching outside BOS in semi-tandem.  Pt's max SLS time was 3-5 sec on each LE with pt able to prevent LOB without assist on 4 of 6 trials and needing min A to prevent LOB on the other two attempts. Pt will benefit from HHPT upon discharge to safely address deficits listed in patient problem list for decreased caregiver assistance and eventual return to PLOF.       Recommendations for follow up therapy are one component of a multi-disciplinary discharge planning process, led by the attending physician.  Recommendations may be updated based on patient status, additional functional criteria and insurance authorization.  Follow Up Recommendations  Home health PT     Assistance Recommended at Discharge Intermittent Supervision/Assistance  Patient can return home with the following A little help with walking and/or transfers;Assist for transportation;Help with stairs or ramp  for entrance   Equipment Recommendations  Rolling walker (2 wheels)    Recommendations for Other Services       Precautions / Restrictions Precautions Precautions: Fall Restrictions Weight Bearing Restrictions: No     Mobility  Bed Mobility Overal bed mobility: Modified Independent             General bed mobility comments: Min extra time and effort only    Transfers Overall transfer level: Needs assistance Equipment used: None Transfers: Sit to/from Stand Sit to Stand: Supervision           General transfer comment: Good control and stability during sit to/from stands from standard height surface    Ambulation/Gait Ambulation/Gait assistance: Supervision Gait Distance (Feet): 150 Feet Assistive device: None Gait Pattern/deviations: Step-through pattern, Decreased step length - right, Decreased step length - left Gait velocity: decreased     General Gait Details: Slow cadence and short B step length but steady without LOB   Stairs             Wheelchair Mobility    Modified Rankin (Stroke Patients Only)       Balance Overall balance assessment: Needs assistance   Sitting balance-Leahy Scale: Normal     Standing balance support: No upper extremity supported, During functional activity Standing balance-Leahy Scale: Good                              Cognition Arousal/Alertness: Awake/alert Behavior During Therapy: WFL for tasks assessed/performed Overall Cognitive Status: Within Functional Limits for tasks assessed  Exercises Other Exercises Other Exercises: Static standing balance training with feet apart, together, and semi-tandem with combinations of eyes open/closed and head still/head turns Other Exercises: SLS practice with max standing time 3-5 sec Other Exercises: Dynamic standing balance training with with feet apart, together, and semi-tandem with reaching  outside BOS    General Comments        Pertinent Vitals/Pain Pain Assessment Pain Assessment: No/denies pain    Home Living                          Prior Function            PT Goals (current goals can now be found in the care plan section) Progress towards PT goals: Progressing toward goals    Frequency    Min 2X/week      PT Plan Current plan remains appropriate    Co-evaluation              AM-PAC PT "6 Clicks" Mobility   Outcome Measure  Help needed turning from your back to your side while in a flat bed without using bedrails?: None Help needed moving from lying on your back to sitting on the side of a flat bed without using bedrails?: None Help needed moving to and from a bed to a chair (including a wheelchair)?: A Little Help needed standing up from a chair using your arms (e.g., wheelchair or bedside chair)?: A Little Help needed to walk in hospital room?: A Little Help needed climbing 3-5 steps with a railing? : A Little 6 Click Score: 20    End of Session Equipment Utilized During Treatment: Gait belt Activity Tolerance: Patient tolerated treatment well Patient left: Other (comment) (Pt left in BR with instructions to pull red cord when done) Nurse Communication: Mobility status PT Visit Diagnosis: Unsteadiness on feet (R26.81);Difficulty in walking, not elsewhere classified (R26.2);Muscle weakness (generalized) (M62.81)     Time: 2353-6144 PT Time Calculation (min) (ACUTE ONLY): 28 min  Charges:  $Gait Training: 8-22 mins $Therapeutic Exercise: 8-22 mins                     D. Scott Jaiveon Suppes PT, DPT 04/27/22, 4:42 PM

## 2022-04-27 NOTE — TOC Initial Note (Signed)
Transition of Care [facility]) - Initial/Assessment Note    Patient Details  Name: Sara Stafford MRN: [id number] Date of Birth: 04/30/1946  Transition of Care [facility]) CM/SW Contact:    Colen Darling, [place] Phone Number: 04/27/2022, 12:44 PM  Clinical Narrative:                  TOC met with the patient at bedside.She sees Dr. Sabra Heck at [facility]. Her husband drives her to appointments. She lives in [place] in [place]. She would like a referral to HHPT. Pharmacy is [facility] in [place].  Expected Discharge Plan: [place]     Patient Goals and CMS Choice  HHPT in [place]      Expected Discharge Plan and Services Expected Discharge Plan: [place]   Discharge Planning Services: CM Consult   Living arrangements for the past 2 months: [facility])                                      Prior Living Arrangements/Services Living arrangements for the past 2 months: [facility])   Patient language and need for interpreter reviewed:: No        Need for Family Participation in Patient Care: Yes (Comment) Care giver support system in place?: Yes (comment)   Criminal Activity/Legal Involvement Pertinent to Current Situation/Hospitalization: No - Comment as needed  Activities of Daily Living Home Assistive Devices/Equipment: CBG Meter ADL Screening (condition at time of admission) Patient's cognitive ability adequate to safely complete daily activities?: Yes Is the patient deaf or have difficulty hearing?: No Does the patient have difficulty seeing, even when wearing glasses/contacts?: Yes Does the patient have difficulty concentrating, remembering, or making decisions?: No Patient able to express need for assistance with ADLs?: Yes Does the patient have difficulty dressing or bathing?: No Independently performs ADLs?: Yes (appropriate  for developmental age) Does the patient have difficulty walking or climbing stairs?: No Weakness of Legs: None Weakness of Arms/Hands: None  Permission Sought/Granted Permission sought to share information with : PCP                Emotional Assessment Appearance:: Appears stated age Attitude/Demeanor/Rapport: Gracious Affect (typically observed): Accepting Orientation: : Oriented to Situation, Oriented to  Time, Oriented to Place, Oriented to Self      Admission diagnosis:  Hyponatremia [E87.1] Patient Active Problem List   Diagnosis Date Noted   Hyponatremia 04/26/2022   Hypertension 06/02/2020   GAD (generalized anxiety disorder) 05/31/2017   Vitamin D deficiency 08/03/2016   Well woman exam 07/29/2016   Osteoporosis 10/05/2015   Macular degeneration 06/02/2014   MAMMOGRAM, ABNORMAL, LEFT 09/10/2009   Pulmonary nodules 03/20/2009   ANEMIAS DUE TO DISORDERS GLUTATHIONE METABOLISM 07/31/2008   COPD GOLD II  10/11/2007   Diabetes ([place]) 06/04/2007   HYPERLIPIDEMIA 06/04/2007   Depression 06/04/2007   Esophagitis 06/04/2007   FIBROMYALGIA 06/04/2007   PCP:  Rusty Aus, MD Pharmacy:   [place], [place] - [address] [place] [address] Phone: [phone] Fax: [phone]  [id number] [address], [place] AT [place] [address] Phone: [phone] Fax: [phone]     Social Determinants of Health (SDOH) Interventions    Readmission  Risk Interventions     No data to display

## 2022-04-28 LAB — SODIUM: Sodium: 136 mmol/L (ref 135–145)

## 2022-04-28 LAB — GLUCOSE, CAPILLARY
Glucose-Capillary: 160 mg/dL — ABNORMAL HIGH (ref 70–99)
Glucose-Capillary: 189 mg/dL — ABNORMAL HIGH (ref 70–99)
Glucose-Capillary: 256 mg/dL — ABNORMAL HIGH (ref 70–99)

## 2022-04-28 MED ORDER — METOPROLOL TARTRATE 25 MG PO TABS: 25.0000 mg | ORAL_TABLET | Freq: Two times a day (BID) | ORAL | 0 refills | Status: AC

## 2022-04-28 MED ORDER — AMLODIPINE BESYLATE 10 MG PO TABS: 10.0000 mg | ORAL_TABLET | Freq: Every day | ORAL | 0 refills | Status: AC

## 2022-04-28 NOTE — Discharge Summary (Signed)
Physician Discharge Summary  Sara Stafford JJK:[id number] DOB: [birth year] DOA: 04/25/2022  PCP: Rusty Aus, MD  Admit date: 04/25/2022 Discharge date: 04/28/2022  Admitted From: Home Disposition:  Home  Discharge Condition:Stable CODE STATUS:FULL Diet recommendation: Heart Healthy   Brief/Interim Summary: Patient is a 76 year old female with history of COPD, depression, diabetes type 2, fibromyalgia, hypertension, hyperlipidemia, smoker who presented to the emergency department with complaints of generalized weakness and low sodium as per the blood work done on the day of admission as an outpatient. No history of vomiting, diarrhea.she said she had a cholecystectomy few months ago and has low appetite since then .on presentation, she was hemodynamically stable. Lab work showed a sodium of 116. Patient was admitted for further management.  Patient responded to normal saline but her sodium level remained  at 124.  Nephrology consulted.  Have given a dose of tolvaptan with sodium level in the range of 130s today.  Medically stable for discharge home today.  Following problems were addressed during her hospitalization:  Hyponatremia: Makes picture of hypovolemic hyponatremia versus SIADH.  Urine sodium of 28.  She responded partly to IV fluids.  Given a dose of tolvaptan by nephrology with significant improvement in the sodium level today. Check sodium level in a week.  We have encouraged her to have fluids which contains electrolytes.   Hypertension: Hypertensive on presentation.  She was recently taken off from metoprolol by her PCP.  Home metoprolol resumed.added amlod 10 mg.     GAD/depression: Previously on venlafaxine but her PCP has stopped it.  Continue holding.  Continue Xanax as needed   History of esophagitis: Continue PPI   COPD/smoker: Past smoker.Takes inhalers at home.  C  Chest x-ray does not show any pneumonia or lung mass.   History of hyperlipidemia: On  Crestor   Hypomagnesemia: Supplemented with magnesium   Diabetes type 2: Takes glipizide, actos,metformin at home.    Monitor blood sugars at home.A1c of 9   Generalized weakness: HH recommended as per  PT consultation    Discharge Diagnoses:  Principal Problem:   Hyponatremia Active Problems:   Diabetes ([place])   Depression   COPD GOLD II    Esophagitis   GAD (generalized anxiety disorder)   Hypertension    Discharge Instructions  Discharge Instructions     Diet - low sodium heart healthy   Complete by: As directed    Discharge instructions   Complete by: As directed    1)Please take prescribed medications as instructed 2)Follow up with your PCP in a week.Do a BMP test to check your sodium level during the follow-up 3)Monitor your blood pressure at home 4)We encourage you to have fluids containing electrolytes   Increase activity slowly   Complete by: As directed       Allergies as of 04/28/2022       Reactions   Empagliflozin Nausea And Vomiting, Nausea Only   Ozempic (0.25 Or 0.5 Mg-dose) [semaglutide(0.25 Or 0.38m-dos)] Nausea And Vomiting   Semaglutide Nausea Only   Codeine Nausea And Vomiting   Doxycycline Other (See Comments)   REACTION: thrush thrush        Medication List     STOP taking these medications    acetaminophen 325 MG tablet Commonly known as: TYLENOL   cetirizine 10 MG tablet Commonly known as: ZYRTEC   furosemide 20 MG tablet Commonly known as: LASIX   meloxicam 7.5 MG tablet Commonly known as: MOBIC   pregabalin 100 MG capsule Commonly  known as: LYRICA   traMADol 50 MG tablet Commonly known as: Ultram   venlafaxine 37.5 MG tablet Commonly known as: EFFEXOR       TAKE these medications    ALPRAZolam 0.25 MG tablet Commonly known as: XANAX Take 0.25 mg by mouth at bedtime as needed for anxiety.   amLODipine 10 MG tablet Commonly known as: NORVASC Take 1 tablet (10 mg total) by mouth daily. Start taking on:  April 29, 2022   azelastine 0.1 % nasal spray Commonly known as: ASTELIN Place 1 spray into both nostrils as needed for rhinitis. Use in each nostril as directed   cyanocobalamin 500 MCG tablet Commonly known as: VITAMIN B12 Take 500 mcg by mouth daily.   gabapentin 100 MG capsule Commonly known as: NEURONTIN Take 100 mg by mouth 2 (two) times daily.   glipiZIDE 10 MG 24 hr tablet Commonly known as: GLUCOTROL XL TAKE (2) TABLETS BY MOUTH ONCE DAILY. What changed: See the new instructions.   metFORMIN 750 MG 24 hr tablet Commonly known as: GLUCOPHAGE-XR Take 750 mg by mouth every evening.   metoprolol tartrate 25 MG tablet Commonly known as: LOPRESSOR Take 1 tablet (25 mg total) by mouth 2 (two) times daily.   multivitamin with minerals Tabs tablet Take 1 tablet by mouth daily.   omeprazole 20 MG capsule Commonly known as: PRILOSEC TAKE (1) CAPSULE BY MOUTH ONCE DAILY AS NEEDED.   pioglitazone 15 MG tablet Commonly known as: ACTOS Take 15 mg by mouth daily.   rosuvastatin 20 MG tablet Commonly known as: CRESTOR Take 20 mg by mouth at bedtime.   True Metrix Meter w/Device Kit   TRUEplus Lancets 30G Misc   Ventolin HFA 108 (90 Base) MCG/ACT inhaler Generic drug: albuterol INHALE 2 PUFFS BY MOUTH EVERY 4 HOURS AS NEEDED   Wixela Inhub 250-50 MCG/ACT Aepb Generic drug: fluticasone-salmeterol TAKE 1 PUFF BY MOUTH TWICE A DAY        Allergies  Allergen Reactions   Empagliflozin Nausea And Vomiting and Nausea Only   Ozempic (0.25 Or 0.5 Mg-Dose) [Semaglutide(0.25 Or 0.43m-Dos)] Nausea And Vomiting   Semaglutide Nausea Only   Codeine Nausea And Vomiting   Doxycycline Other (See Comments)    REACTION: thrush  thrush    Consultations: Nephrology   Procedures/Studies: DG Chest Port 1 View  Result Date: 04/26/2022 CLINICAL DATA:  Hyponatremia EXAM: PORTABLE CHEST 1 VIEW COMPARISON:  04/30/2014 FINDINGS: Cardiac shadow is within normal limits. Aortic  calcifications are again seen. The lungs are clear. No bony abnormality is noted. IMPRESSION: No active disease. Electronically Signed   By: MInez CatalinaM.D.   On: 04/26/2022 01:17      Subjective: Patient seen and examined at bedside today.  Hemodynamically stable for discharge.  Discharge Exam: Vitals:   04/28/22 0455 04/28/22 0752  BP: (!) 150/69 (!) 152/81  Pulse: 96 97  Resp: 20 18  Temp: 98.6 F (37 C) 99.2 F (37.3 C)  SpO2: 98% 99%   Vitals:   04/28/22 0021 04/28/22 0225 04/28/22 0455 04/28/22 0752  BP: 133/74  (!) 150/69 (!) 152/81  Pulse: 85  96 97  Resp: _0 Temp: 98.2 F (36.8 C)  98.6 F (37 C) 99.2 F (37.3 C)  TempSrc: Oral  Oral   SpO2: 97%  98% 99%  Weight:  62.5 kg    Height:        General: Pt is alert, awake, not in acute distress Cardiovascular: RRR, S1/S2 +,  no rubs, no gallops Respiratory: CTA bilaterally, no wheezing, no rhonchi Abdominal: Soft, NT, ND, bowel sounds + Extremities: no edema, no cyanosis    The results of significant diagnostics from this hospitalization (including imaging, microbiology, ancillary and laboratory) are listed below for reference.     Microbiology: No results found for this or any previous visit (from the past 240 hour(s)).   Labs: BNP (last 3 results) No results for input(s): "BNP" in the last 8760 hours. Basic Metabolic Panel: Recent Labs  Lab 04/25/22 1824 04/26/22 0621 04/26/22 1104 04/27/22 0419 04/27/22 1000 04/27/22 1716 04/27/22 2239 04/28/22 0616  NA 116* 119*   < > 121* 124* 124* 132* 136  K 5.0 4.4  --  4.8  --  5.0  --   --   CL 83* 84*  --  92*  --  90*  --   --   CO2 22 28  --  24  --  26  --   --   GLUCOSE 151* 157*  --  153*  --  283*  --   --   BUN 24* 20  --  13  --  13  --   --   CREATININE 0.93 0.64  --  0.73  --  0.95  --   --   CALCIUM 9.1 9.3  --  9.1  --  8.8*  --   --   MG  --  1.6*  1.5*  --  1.7  --   --   --   --    < > = values in this interval not  displayed.   Liver Function Tests: Recent Labs  Lab 04/25/22 1824 04/26/22 0621 04/27/22 1716  AST _0 ALT _1 ALKPHOS 53 60 63  BILITOT 0.5 0.6 0.6  PROT 6.2* 6.7 6.2*  ALBUMIN 3.7 3.9 3.5   Recent Labs  Lab 04/25/22 1824  LIPASE 101*   No results for input(s): "AMMONIA" in the last 168 hours. CBC: Recent Labs  Lab 04/25/22 1824 04/26/22 0621  WBC 10.2 10.7*  NEUTROABS 8.0*  --   HGB 11.7* 12.3  HCT 34.4* 34.4*  MCV 87.3 84.7  PLT 359 403*   Cardiac Enzymes: No results for input(s): "CKTOTAL", "CKMB", "CKMBINDEX", "TROPONINI" in the last 168 hours. BNP: Invalid input(s): "POCBNP" CBG: Recent Labs  Lab 04/27/22 1235 04/27/22 1648 04/27/22 1947 04/28/22 0020 04/28/22 0753  GLUCAP 154* 200* 123* 160* 189*   D-Dimer No results for input(s): "DDIMER" in the last 72 hours. Hgb A1c Recent Labs    04/26/22 0621  HGBA1C 9.0*   Lipid Profile No results for input(s): "CHOL", "HDL", "LDLCALC", "TRIG", "CHOLHDL", "LDLDIRECT" in the last 72 hours. Thyroid function studies Recent Labs    04/26/22 0621  TSH 1.798   Anemia work up No results for input(s): "VITAMINB12", "FOLATE", "FERRITIN", "TIBC", "IRON", "RETICCTPCT" in the last 72 hours. Urinalysis    Component Value Date/Time   COLORURINE YELLOW (A) 04/25/2022 1857   APPEARANCEUR HAZY (A) 04/25/2022 1857   APPEARANCEUR Hazy (A) 01/26/2021 1309   LABSPEC 1.011 04/25/2022 1857   PHURINE 5.0 04/25/2022 1857   GLUCOSEU NEGATIVE 04/25/2022 1857   GLUCOSEU NEGATIVE 02/29/2012 1116   HGBUR NEGATIVE 04/25/2022 1857   HGBUR large 01/01/2010 [place] 04/25/2022 1857   BILIRUBINUR Negative 01/26/2021 1309   KETONESUR NEGATIVE 04/25/2022 1857   PROTEINUR NEGATIVE 04/25/2022 1857   UROBILINOGEN 0.2 02/29/2012 1116   NITRITE NEGATIVE  04/25/2022 [place] 04/25/2022 1857   Sepsis Labs Recent Labs  Lab 04/25/22 1824 04/26/22 0621  WBC 10.2 10.7*    Microbiology No results found for this or any previous visit (from the past 240 hour(s)).  Please note: You were cared for by a hospitalist during your hospital stay. Once you are discharged, your primary care physician will handle any further medical issues. Please note that NO REFILLS for any discharge medications will be authorized once you are discharged, as it is imperative that you return to your primary care physician (or establish a relationship with a primary care physician if you do not have one) for your post hospital discharge needs so that they can reassess your need for medications and monitor your lab values.    Time coordinating discharge: 40 minutes  SIGNED:   Shelly Coss, MD  [facility] 04/28/2022, 10:55 AM Pager [phone]  If 7PM-7AM, please contact night-coverage [email or web address] Password [email or web address]

## 2022-04-28 NOTE — Progress Notes (Signed)
[facility]  ROUNDING NOTE   Subjective:   Patient seen sitting up in chair, alert and oriented Completed breakfast tray at bedside Remains on room air No lower extremity edema Denies dizziness and states fatigue is improving  Sodium 136  Objective:  Vital signs in last 24 hours:  Temp:  [98 F (36.7 C)-99.2 F (37.3 C)] 99.2 F (37.3 C) (11/30 0752) Pulse Rate:  [85-102] 97 (11/30 0752) Resp:  [16-20] 18 (11/30 0752) BP: (119-159)/(67-81) 152/81 (11/30 0752) SpO2:  [97 %-99 %] 99 % (11/30 0752) Weight:  [62.5 kg] 62.5 kg (11/30 0225)  Weight change:  Filed Weights   04/25/22 1822 04/28/22 0225  Weight: 56.2 kg 62.5 kg    Intake/Output: I/O last 3 completed shifts: In: 2350 [P.O.:560; I.V.:1790] Out: 2 [Urine:2]   Intake/Output this shift:  Total I/O In: 100 [P.O.:100] Out: -   Physical Exam: General: NAD, sitting in chair  Head: Normocephalic, atraumatic. Moist oral mucosal membranes  Eyes: Anicteric  Lungs:  Clear to auscultation, normal effort, room air  Heart: Regular rate and rhythm  Abdomen:  Soft, nontender, nondistended  Extremities: No peripheral edema.  Neurologic: Nonfocal, moving all four extremities  Skin: No lesions  Access: None    Basic Metabolic Panel: Recent Labs  Lab 04/25/22 1824 04/26/22 0621 04/26/22 1104 04/27/22 0419 04/27/22 1000 04/27/22 1716 04/27/22 2239 04/28/22 0616  NA 116* 119*   < > 121* 124* 124* 132* 136  K 5.0 4.4  --  4.8  --  5.0  --   --   CL 83* 84*  --  92*  --  90*  --   --   CO2 22 28  --  24  --  26  --   --   GLUCOSE 151* 157*  --  153*  --  283*  --   --   BUN 24* 20  --  13  --  13  --   --   CREATININE 0.93 0.64  --  0.73  --  0.95  --   --   CALCIUM 9.1 9.3  --  9.1  --  8.8*  --   --   MG  --  1.6*  1.5*  --  1.7  --   --   --   --    < > = values in this interval not displayed.    Liver Function Tests: Recent Labs  Lab 04/25/22 1824 04/26/22 0621 04/27/22 1716  AST 22  22 20   ALT 17 18 18   ALKPHOS 53 60 63  BILITOT 0.5 0.6 0.6  PROT 6.2* 6.7 6.2*  ALBUMIN 3.7 3.9 3.5   Recent Labs  Lab 04/25/22 1824  LIPASE 101*   No results for input(s): "AMMONIA" in the last 168 hours.  CBC: Recent Labs  Lab 04/25/22 1824 04/26/22 0621  WBC 10.2 10.7*  NEUTROABS 8.0*  --   HGB 11.7* 12.3  HCT 34.4* 34.4*  MCV 87.3 84.7  PLT 359 403*    Cardiac Enzymes: No results for input(s): "CKTOTAL", "CKMB", "CKMBINDEX", "TROPONINI" in the last 168 hours.  BNP: Invalid input(s): "POCBNP"  CBG: Recent Labs  Lab 04/27/22 1648 04/27/22 1947 04/28/22 0020 04/28/22 0753 04/28/22 1202  GLUCAP 200* 123* 160* 189* 256*    Microbiology: Results for orders placed or performed in visit on 01/26/21  Microscopic Examination     Status: Abnormal   Collection Time: 01/26/21  1:09 PM   Urine  Result Value  Ref Range Status   WBC, UA 0-5 0 - 5 /hpf Final   RBC, Urine 0-2 0 - 2 /hpf Final   Epithelial Cells (non renal) 0-10 0 - 10 /hpf Final   Casts Present (A) None seen /lpf Final   Cast Type Hyaline casts N/A Final   Bacteria, UA Few None seen/Few Final    Coagulation Studies: No results for input(s): "LABPROT", "INR" in the last 72 hours.  Urinalysis: Recent Labs    04/25/22 1857  COLORURINE YELLOW*  LABSPEC 1.011  PHURINE 5.0  GLUCOSEU NEGATIVE  HGBUR NEGATIVE  BILIRUBINUR NEGATIVE  KETONESUR NEGATIVE  PROTEINUR NEGATIVE  NITRITE NEGATIVE  LEUKOCYTESUR NEGATIVE      Imaging: No results found.   Medications:     amLODipine  10 mg Oral Daily   heparin  5,000 Units Subcutaneous Q8H   insulin aspart  0-15 Units Subcutaneous TID WC   metoprolol tartrate  25 mg Oral BID   rosuvastatin  20 mg Oral QHS   sodium chloride flush  3 mL Intravenous Q12H   acetaminophen **OR** acetaminophen, albuterol, ALPRAZolam, azelastine, guaiFENesin-dextromethorphan, hydrALAZINE, HYDROcodone-acetaminophen, morphine injection  Assessment/ Plan:  [name] is a 76 y.o.  female  with past melanite that 1 medical history including diabetes, fibromyalgia, GERD, hypertension, arthritis, macular degeneration, and hyperlipidemia, who was admitted to [facility] on 04/25/2022 for Hyponatremia [E87.1]   Hyponatremia likely due to poor oral intake with increased free water intake. Sodium on admission 116. Initially treated with normal saline however sodium stalled at 124. SIADH component noted.  Patient received tolvaptan 15 mg once yesterday.  Sodium recovered to 136 today.  Patient cleared to discharge with follow-up with PCP.  Patient encouraged to maintain oral intake and monitor free water intake.  2.  Hypertension, essential.  Currently prescribed amlodipine and metoprolol.  Blood pressure 152/81.   3.  Diabetes mellitus, non-insulin-dependent.  Home regimen includes metformin and glipizide.  Hemoglobin A1c 9.0 on 04/26/2022.  Glucose elevated at times.  Sliding scale managed by primary team.    LOS: 2 Carmin Stafford 11/30/20231:28 PM

## 2022-04-28 NOTE — Plan of Care (Signed)
Patient is adequate for discharge to home with spouse.  Patient reports spouse drives in community to MD appointment and to obtain medication and food.  Patient discharge with son via private vehicle.

## 2022-05-05 ENCOUNTER — Ambulatory Visit: Payer: Medicare Other | Admitting: *Deleted

## 2022-05-05 ENCOUNTER — Other Ambulatory Visit: Payer: Self-pay | Admitting: Internal Medicine

## 2022-05-05 DIAGNOSIS — R634 Abnormal weight loss: Secondary | ICD-10-CM

## 2022-05-05 DIAGNOSIS — R0602 Shortness of breath: Secondary | ICD-10-CM

## 2022-05-05 DIAGNOSIS — E871 Hypo-osmolality and hyponatremia: Secondary | ICD-10-CM

## 2022-05-17 ENCOUNTER — Ambulatory Visit: Payer: Medicare Other | Admitting: *Deleted

## 2022-05-20 ENCOUNTER — Ambulatory Visit
Admission: RE | Admit: 2022-05-20 | Discharge: 2022-05-20 | Disposition: A | Discharge status: Home / Self Care | Payer: Medicare Other | Source: Ambulatory Visit | Attending: Internal Medicine | Admitting: Internal Medicine

## 2022-05-20 DIAGNOSIS — R0602 Shortness of breath: Secondary | ICD-10-CM | POA: Diagnosis present

## 2022-05-20 DIAGNOSIS — R634 Abnormal weight loss: Secondary | ICD-10-CM | POA: Insufficient documentation

## 2022-05-20 DIAGNOSIS — E871 Hypo-osmolality and hyponatremia: Secondary | ICD-10-CM | POA: Diagnosis present

## 2022-05-20 MED ORDER — IOHEXOL 300 MG/ML  SOLN
75.0000 mL | Freq: Once | INTRAMUSCULAR | Status: AC | PRN
  Administered 2022-05-20: 75 mL via INTRAVENOUS

## 2022-07-08 ENCOUNTER — Other Ambulatory Visit: Payer: Self-pay

## 2022-07-08 ENCOUNTER — Observation Stay: Payer: Medicare Other

## 2022-07-08 ENCOUNTER — Observation Stay
Admission: EM | Admit: 2022-07-08 | Discharge: 2022-07-09 | Disposition: A | Discharge status: Home / Self Care | Payer: Medicare Other | Attending: Internal Medicine | Admitting: Internal Medicine

## 2022-07-08 DIAGNOSIS — E119 Type 2 diabetes mellitus without complications: Secondary | ICD-10-CM | POA: Diagnosis not present

## 2022-07-08 DIAGNOSIS — Z79899 Other long term (current) drug therapy: Secondary | ICD-10-CM | POA: Diagnosis not present

## 2022-07-08 DIAGNOSIS — E785 Hyperlipidemia, unspecified: Secondary | ICD-10-CM | POA: Diagnosis present

## 2022-07-08 DIAGNOSIS — E871 Hypo-osmolality and hyponatremia: Secondary | ICD-10-CM | POA: Diagnosis present

## 2022-07-08 DIAGNOSIS — Z87891 Personal history of nicotine dependence: Secondary | ICD-10-CM | POA: Diagnosis not present

## 2022-07-08 DIAGNOSIS — J449 Chronic obstructive pulmonary disease, unspecified: Secondary | ICD-10-CM | POA: Insufficient documentation

## 2022-07-08 DIAGNOSIS — I10 Essential (primary) hypertension: Secondary | ICD-10-CM | POA: Diagnosis present

## 2022-07-08 DIAGNOSIS — F32A Depression, unspecified: Secondary | ICD-10-CM | POA: Diagnosis present

## 2022-07-08 DIAGNOSIS — E782 Mixed hyperlipidemia: Secondary | ICD-10-CM | POA: Diagnosis present

## 2022-07-08 DIAGNOSIS — Z7984 Long term (current) use of oral hypoglycemic drugs: Secondary | ICD-10-CM | POA: Insufficient documentation

## 2022-07-08 LAB — CBC
HCT: 40.7 % (ref 36.0–46.0)
Hemoglobin: 13.1 g/dL (ref 12.0–15.0)
MCH: 29.2 pg (ref 26.0–34.0)
MCHC: 32.2 g/dL (ref 30.0–36.0)
MCV: 90.6 fL (ref 80.0–100.0)
Platelets: 343 10*3/uL (ref 150–400)
RBC: 4.49 MIL/uL (ref 3.87–5.11)
RDW: 11.7 % (ref 11.5–15.5)
WBC: 8.2 10*3/uL (ref 4.0–10.5)
nRBC: 0 % (ref 0.0–0.2)

## 2022-07-08 LAB — COMPREHENSIVE METABOLIC PANEL
ALT: 15 U/L (ref 0–44)
AST: 19 U/L (ref 15–41)
Albumin: 3.9 g/dL (ref 3.5–5.0)
Alkaline Phosphatase: 60 U/L (ref 38–126)
Anion gap: 9 (ref 5–15)
BUN: 22 mg/dL (ref 8–23)
CO2: 27 mmol/L (ref 22–32)
Calcium: 8.9 mg/dL (ref 8.9–10.3)
Chloride: 90 mmol/L — ABNORMAL LOW (ref 98–111)
Creatinine, Ser: 0.73 mg/dL (ref 0.44–1.00)
GFR, Estimated: >60 mL/min (ref >60)
Glucose, Bld: 191 mg/dL — ABNORMAL HIGH (ref 70–99)
Potassium: 4.1 mmol/L (ref 3.5–5.1)
Sodium: 126 mmol/L — ABNORMAL LOW (ref 135–145)
Total Bilirubin: 0.7 mg/dL (ref 0.3–1.2)
Total Protein: 6.6 g/dL (ref 6.5–8.1)

## 2022-07-08 LAB — TROPONIN I (HIGH SENSITIVITY): Troponin I (High Sensitivity): 3 ng/L (ref <18)

## 2022-07-08 LAB — GLUCOSE, CAPILLARY: Glucose-Capillary: 256 mg/dL — ABNORMAL HIGH (ref 70–99)

## 2022-07-08 LAB — OSMOLALITY, URINE: Osmolality, Ur: 386 mOsm/kg (ref 300–900)

## 2022-07-08 LAB — OSMOLALITY: Osmolality: 282 mOsm/kg (ref 275–295)

## 2022-07-08 LAB — SODIUM, URINE, RANDOM: Sodium, Ur: 22 mmol/L

## 2022-07-08 MED ORDER — ONDANSETRON HCL 4 MG PO TABS: 4.0000 mg | ORAL_TABLET | Freq: Four times a day (QID) | ORAL | Status: DC | PRN

## 2022-07-08 MED ORDER — ALPRAZOLAM 0.25 MG PO TABS
0.1250 mg | ORAL_TABLET | Freq: Two times a day (BID) | ORAL | Status: DC | PRN
  Administered 2022-07-08 – 2022-07-09 (×2): 0.125 mg via ORAL
  Filled 2022-07-08 (×2): qty 1

## 2022-07-08 MED ORDER — ACETAMINOPHEN 325 MG PO TABS: 650.0000 mg | ORAL_TABLET | Freq: Four times a day (QID) | ORAL | Status: DC | PRN

## 2022-07-08 MED ORDER — PANTOPRAZOLE SODIUM 40 MG PO TBEC
40.0000 mg | DELAYED_RELEASE_TABLET | Freq: Every day | ORAL | Status: DC
  Administered 2022-07-09: 40 mg via ORAL
  Filled 2022-07-08: qty 1

## 2022-07-08 MED ORDER — ACETAMINOPHEN 650 MG RE SUPP: 650.0000 mg | Freq: Four times a day (QID) | RECTAL | Status: DC | PRN

## 2022-07-08 MED ORDER — SODIUM CHLORIDE 0.9 % IV BOLUS
250.0000 mL | Freq: Once | INTRAVENOUS | Status: AC
  Administered 2022-07-08: 250 mL via INTRAVENOUS

## 2022-07-08 MED ORDER — METOPROLOL TARTRATE 25 MG PO TABS
25.0000 mg | ORAL_TABLET | Freq: Two times a day (BID) | ORAL | Status: DC
  Administered 2022-07-08 – 2022-07-09 (×2): 25 mg via ORAL
  Filled 2022-07-08 (×2): qty 1

## 2022-07-08 MED ORDER — SENNOSIDES-DOCUSATE SODIUM 8.6-50 MG PO TABS: 1.0000 | ORAL_TABLET | Freq: Every evening | ORAL | Status: DC | PRN

## 2022-07-08 MED ORDER — AMLODIPINE BESYLATE 10 MG PO TABS
10.0000 mg | ORAL_TABLET | Freq: Every day | ORAL | Status: DC
  Administered 2022-07-09: 10 mg via ORAL
  Filled 2022-07-08: qty 1

## 2022-07-08 MED ORDER — SODIUM CHLORIDE 0.9 % IV SOLN: INTRAVENOUS | Status: DC

## 2022-07-08 MED ORDER — ALBUTEROL SULFATE (2.5 MG/3ML) 0.083% IN NEBU: 3.0000 mL | INHALATION_SOLUTION | RESPIRATORY_TRACT | Status: DC | PRN

## 2022-07-08 MED ORDER — ROSUVASTATIN CALCIUM 10 MG PO TABS
20.0000 mg | ORAL_TABLET | Freq: Every day | ORAL | Status: DC
  Administered 2022-07-08: 20 mg via ORAL
  Filled 2022-07-08: qty 2

## 2022-07-08 MED ORDER — ENOXAPARIN SODIUM 40 MG/0.4ML IJ SOSY
40.0000 mg | PREFILLED_SYRINGE | INTRAMUSCULAR | Status: DC
  Administered 2022-07-08: 40 mg via SUBCUTANEOUS
  Filled 2022-07-08: qty 0.4

## 2022-07-08 MED ORDER — INSULIN ASPART 100 UNIT/ML IJ SOLN
0.0000 [IU] | Freq: Every day | INTRAMUSCULAR | Status: DC
  Administered 2022-07-08: 3 [IU] via SUBCUTANEOUS
  Filled 2022-07-08: qty 1

## 2022-07-08 MED ORDER — HYDRALAZINE HCL 10 MG PO TABS: 10.0000 mg | ORAL_TABLET | Freq: Four times a day (QID) | ORAL | Status: DC | PRN

## 2022-07-08 MED ORDER — INSULIN ASPART 100 UNIT/ML IJ SOLN
0.0000 [IU] | Freq: Three times a day (TID) | INTRAMUSCULAR | Status: DC
  Administered 2022-07-09: 3 [IU] via SUBCUTANEOUS
  Administered 2022-07-09: 2 [IU] via SUBCUTANEOUS
  Filled 2022-07-08 (×2): qty 1

## 2022-07-08 MED ORDER — ONDANSETRON HCL 4 MG/2ML IJ SOLN: 4.0000 mg | Freq: Four times a day (QID) | INTRAMUSCULAR | Status: DC | PRN

## 2022-07-08 NOTE — ED Triage Notes (Signed)
Pt states for the past 2 days she has been having a hot sensation on the inside of her body from her chest down, states that she has taken 2 days worth of effexor that is new for her and her xanax was increased to 1 tab in the am 1 tab in the afternoon and 2 tabs at night, pt states that her sodium has been low in the past and she wants to make sure it isn't low or if maybe she is reacting to the effexor

## 2022-07-08 NOTE — ED Provider Notes (Signed)
[facility] Provider Note    Event Date/Time   First MD Initiated Contact with Patient 07/08/22 1729     (approximate)   History   hot sensation   HPI  Sara Stafford is a 77 y.o. female history of hyponatremia anxiety  Patient reports that about 2 days ago she started developing a weird kind of burning sensation that she reports felt similar to when she had low sodium in the past.  She also has been feeling unusually anxious for about a week or 2 and saw Dr. Sabra Heck who increased her alprazolam and started her on Effexor a couple days ago  She no headaches no chest pain no trouble breathing.  Reports drinking about 32 ounces of water a day she is very careful with that.  She does not drink large amounts of water.  She been eating and drinking  No muscle weakness but is developing a weird hard to describe unusual sensation on her torso she reports she felt the same when her sodium was low in November and she was admitted for 3 nights     Physical Exam   Triage Vital Signs: ED Triage Vitals  Enc Vitals Group     BP 07/08/22 1458 (!) 144/80     Pulse Rate 07/08/22 1458 90     Resp 07/08/22 1458 16     Temp 07/08/22 1458 97.7 F (36.5 C)     Temp Source 07/08/22 1458 Oral     SpO2 07/08/22 1458 98 %     Weight 07/08/22 1459 117 lb (53.1 kg)     Height 07/08/22 1459 5' 5"$  (1.651 m)     Head Circumference --      Peak Flow --      Pain Score 07/08/22 1458 0     Pain Loc --      Pain Edu? --      Excl. in [place]? --     Most recent vital signs: Vitals:   07/08/22 1458  BP: (!) 144/80  Pulse: 90  Resp: 16  Temp: 97.7 F (36.5 C)  SpO2: 98%     General: Awake, no distress.  CV:  Good peripheral perfusion.  Resp:  Normal effort.  Abd:  No distention.  Other:  No tremor.  Fully alert well-oriented no confusion.   ED Results / Procedures / Treatments   Labs (all labs ordered are listed, but only abnormal results are displayed) Labs  Reviewed  COMPREHENSIVE METABOLIC PANEL - Abnormal; Notable for the following components:      Result Value   Sodium 126 (*)    Chloride 90 (*)    Glucose, Bld 191 (*)    All other components within normal limits  CBC     EKG  And interpreted by me at 1500 heart rate 85 QRS 70 QTc 420 Normal sinus rhythm no evidence of ischemia   RADIOLOGY     PROCEDURES:  Critical Care performed: No  Procedures   MEDICATIONS ORDERED IN ED: Medications  sodium chloride 0.9 % bolus 250 mL (has no administration in time range)     IMPRESSION / MDM / ASSESSMENT AND PLAN / ED COURSE  I reviewed the triage vital signs and the nursing notes.                              Differential diagnosis includes, but is not limited to, euvolemic hypovolemic or  hypervolemic hyponatremia, medication side effect, SIADH etc.  I discussed her case and sodium level as well as her previous clinical history with Dr. Abigail Butts his recommendation is to start the patient on about 82 cc/h of normal saline and consult the hospitalist with the plan to recheck her sodium level later tonight or tomorrow morning.  He advises nephrology will provide consultation  Discussed with the patient.  She is hemodynamically stable without acute confusion.  She appears euvolemic by exam.  She is understanding agreeable with plan for admission, due to her the level of her hyponatremia and development of what appears to be a associated paresthesia she would benefit I suspect from admission for further care treatment and slow correction of her sodium  Patient's presentation is most consistent with acute complicated illness / injury requiring diagnostic workup.     Consulted with and patient accepted to the hospitalist service by Dr. Tobie Poet     FINAL CLINICAL IMPRESSION(S) / ED DIAGNOSES   Final diagnoses:  Hyponatremia     Rx / DC Orders   ED Discharge Orders     None        Note:  This document was prepared using  Dragon voice recognition software and may include unintentional dictation errors.   Delman Kitten, MD 07/08/22 [phone]

## 2022-07-08 NOTE — Assessment & Plan Note (Signed)
-   Amlodipine 10 mg daily, metoprolol tartrate 25 mg p.o. twice daily resumed - Hydralazine 10 mg p.o. every 6 hours as needed for SBP greater than 180, 4 days ordered

## 2022-07-08 NOTE — Assessment & Plan Note (Signed)
-   Rosuvastatin 20 mg nightly resumed 

## 2022-07-08 NOTE — Assessment & Plan Note (Addendum)
-   Asymptomatic - Check urine sodium, urine osmolality, serum osmolality level - Patient is status post sodium chloride 250 mL one-time dose ordered - Sodium chloride infusion at 100 mL/h, 1 day ordered

## 2022-07-08 NOTE — Assessment & Plan Note (Signed)
-   Glipizide, metformin, Sitagliptin, were not resumed on admission - Insulin SSI with at bedtime coverage ordered

## 2022-07-08 NOTE — Plan of Care (Signed)
  Problem: Clinical Measurements: Goal: Ability to maintain clinical measurements within normal limits will improve Outcome: Progressing Goal: Diagnostic test results will improve Outcome: Progressing   Problem: Safety: Goal: Ability to remain free from injury will improve Outcome: Progressing   

## 2022-07-08 NOTE — Assessment & Plan Note (Signed)
With anxiety - Alprazolam 0.125 mg p.o. twice daily as needed for anxiety and sleep resumed

## 2022-07-08 NOTE — H&P (Addendum)
History and Physical   Sara Stafford [id number] DOB: [birth year] DOA: 07/08/2022  PCP: Rusty Aus, MD  Patient coming from: Home  I have personally briefly reviewed patient's old medical records in [place].  Chief Concern: Symptomatic hyponatremia  HPI: Ms. Anquanette Biondo is a 77 year old female with history of hypertension, non-insulin-dependent diabetes mellitus, hyperlipidemia, anxiety, who presents emergency department for chief concerns of 'feeling like she has hyponatremia'.  Initial vitals in the ED showed temperature of 97.7, respiration rate of 16, heart rate of 90, blood pressure 144/80, SpO2 98% on room air.  Serum sodium is 126, potassium 4.1, chloride 90, bicarb 27, BUN of 22, serum creatinine of 0.72, eGFR greater than 60, nonfasting blood glucose 191, WBC 8.2, hemoglobin 13.1, platelets of 343.  ED treatment: None ---------------------- At bedside, patient was able to tell me her name, age, location, current calendar year.  Her Sister Stanton Kidney was at bedside with patient's permission.  She reports that today she had a warm sensation feeling in her belly.  She reports it does not feel like hot flashes.  She reports this feels like every time she develops low sodium level, prompting her to present to the emergency department for further evaluation.  She denies chest pain, abdominal pain, dysuria, hematuria, diarrhea, shortness of breath, fever, chills, nausea, vomiting.  Social history: She lives at home with her husband.  She denies tobacco, EtOH, recreational drug use.  ROS: Constitutional: no weight change, no fever ENT/Mouth: no sore throat, no rhinorrhea Eyes: no eye pain, no vision changes Cardiovascular: no chest pain, no dyspnea,  no edema, no palpitations Respiratory: no cough, no sputum, no wheezing Gastrointestinal: no nausea, no vomiting, no diarrhea, no constipation Genitourinary: no urinary incontinence, no dysuria, no  hematuria Musculoskeletal: no arthralgias, no myalgias Skin: no skin lesions, no pruritus, Neuro: no weakness, no loss of consciousness, no syncope, + warm feeling in her body Psych: no anxiety, no depression, no decrease appetite Heme/Lymph: no bruising, no bleeding  ED Course: Discussed with emergency medicine provider, patient requiring hospitalization for chief concerns symptomatic hyponatremia.  Assessment/Plan  Principal Problem:   Hyponatremia Active Problems:   Diabetes ([place])   Hyperlipidemia   Depression   Hypertension   Assessment and Plan:  * Hyponatremia - Asymptomatic - Check urine sodium, urine osmolality, serum osmolality level - Patient is status post sodium chloride 250 mL one-time dose ordered - Sodium chloride infusion at 100 mL/h, 1 day ordered  Hypertension - Amlodipine 10 mg daily, metoprolol tartrate 25 mg p.o. twice daily resumed - Hydralazine 10 mg p.o. every 6 hours as needed for SBP greater than 180, 4 days ordered  Depression With anxiety - Alprazolam 0.125 mg p.o. twice daily as needed for anxiety and sleep resumed  Hyperlipidemia - Rosuvastatin 20 mg nightly resumed  Diabetes ([place]) - Glipizide, metformin, Sitagliptin, were not resumed on admission - Insulin SSI with at bedtime coverage ordered  Chart reviewed.   DVT prophylaxis: Enoxaparin 40 mg subcutaneous Code Status: Full code Diet: Carb modified Family Communication: Updated Sister Mary at bedside with patient's permission Disposition Plan: Pending clinical course Consults called: None at this time Admission status: Telemetry medical, observation  Past Medical History:  Diagnosis Date   Arthritis    COPD (chronic obstructive pulmonary disease) ([place])    no change with spiriva, poor tolerance of advair   Depressive disorder, not elsewhere classified    Diabetes mellitus    Fatigue    Fibromyalgia    GERD (gastroesophageal  reflux disease)    Hyperlipidemia    Hypertension     Macular degeneration    Routine general medical examination at a health care facility    Past Surgical History:  Procedure Laterality Date   ABDOMINAL HYSTERECTOMY     partial   CATARACT EXTRACTION Left 02/09/2015   CATARACT EXTRACTION Right 01/26/2015   CHOLECYSTECTOMY N/A 02/16/2022   Procedure: LAPAROSCOPIC CHOLECYSTECTOMY WITH INTRAOPERATIVE CHOLANGIOGRAM;  Surgeon: Robert Bellow, MD;  Location: [facility] ORS;  Service: General;  Laterality: N/A;  Floyce Stakes, RNFA to assist   COLONOSCOPY WITH PROPOFOL N/A 04/28/2021   Procedure: COLONOSCOPY WITH PROPOFOL;  Surgeon: Robert Bellow, MD;  Location: [facility] ENDOSCOPY;  Service: Endoscopy;  Laterality: N/A;   ESOPHAGOGASTRODUODENOSCOPY (EGD) WITH PROPOFOL N/A 04/28/2021   Procedure: ESOPHAGOGASTRODUODENOSCOPY (EGD) WITH PROPOFOL;  Surgeon: Robert Bellow, MD;  Location: [facility] ENDOSCOPY;  Service: Endoscopy;  Laterality: N/A;   Social History:  reports that she quit smoking about 12 years ago. Her smoking use included cigarettes. She has a 50.00 pack-year smoking history. She has never used smokeless tobacco. She reports that she does not drink alcohol and does not use drugs.  Allergies  Allergen Reactions   Empagliflozin Nausea And Vomiting and Nausea Only   Ozempic (0.25 Or 0.5 Mg-Dose) [Semaglutide(0.25 Or 0.42m-Dos)] Nausea And Vomiting   Semaglutide Nausea Only   Codeine Nausea And Vomiting   Doxycycline Other (See Comments)    REACTION: thrush  thrush   Family History  Problem Relation Age of Onset   Stroke Father [phone]  Diabetes Father    Asthma Father    Liver cancer Sister [phone]  Pancreatic cancer Sister 738  Brain cancer Brother    Diabetes Sister    Diabetes Sister    Diabetes Sister    Diabetes Sister    Diabetes Sister    Diabetes Sister    Diabetes Sister    Diabetes Paternal Grandmother    Breast cancer Maternal Aunt 780  Family history: Family history reviewed and not pertinent.  Prior to Admission  medications   Medication Sig Start Date End Date Taking? Authorizing Provider  ALPRAZolam (Duanne Moron 0.25 MG tablet Take 0.25 mg by mouth at bedtime as needed for anxiety.    [provider]  amLODipine (NORVASC) 10 MG tablet Take 1 tablet (10 mg total) by mouth daily. 04/29/22   AShelly Coss MD  azelastine (ASTELIN) 0.1 % nasal spray Place 1 spray into both nostrils as needed for rhinitis. Use in each nostril as directed    [provider]  Blood Glucose Monitoring Suppl (TRUE METRIX METER) w/Device KIT  06/08/18   [provider]  cyanocobalamin (VITAMIN B12) 500 MCG tablet Take 500 mcg by mouth daily.    [provider]  gabapentin (NEURONTIN) 100 MG capsule Take 100 mg by mouth 2 (two) times daily.    [provider]  glipiZIDE (GLUCOTROL XL) 10 MG 24 hr tablet TAKE (2) TABLETS BY MOUTH ONCE DAILY. Patient taking differently: 10 mg 2 (two) times daily. 11/02/17   CPleas Koch NP  metFORMIN (GLUCOPHAGE-XR) 750 MG 24 hr tablet Take 750 mg by mouth every evening.    [provider]  metoprolol tartrate (LOPRESSOR) 25 MG tablet Take 1 tablet (25 mg total) by mouth 2 (two) times daily. 04/28/22   AShelly Coss MD  Multiple Vitamin (MULTIVITAMIN WITH MINERALS) TABS tablet Take 1 tablet by mouth daily.    [provider]  omeprazole (PRILOSEC) 20  MG capsule TAKE (1) CAPSULE BY MOUTH ONCE DAILY AS NEEDED. 08/01/17   Pleas Koch, NP  pioglitazone (ACTOS) 15 MG tablet Take 15 mg by mouth daily. 04/15/22 04/15/23  [provider]  rosuvastatin (CRESTOR) 20 MG tablet Take 20 mg by mouth at bedtime. 04/02/20   [provider]  TRUEplus Lancets 30G [place]  06/08/18   [provider]  VENTOLIN HFA 108 (90 Base) MCG/ACT inhaler INHALE 2 PUFFS BY MOUTH EVERY 4 HOURS AS NEEDED 06/21/16   Lucille Passy, MD  WIXELA INHUB 250-50 MCG/DOSE AEPB TAKE 1 PUFF BY MOUTH TWICE A DAY 11/16/18   [provider]   omeprazole (PRILOSEC OTC) 20 MG tablet Take 1 tablet (20 mg total) by mouth daily as needed. 02/29/12 10/15/12  Lucille Passy, MD   Physical Exam: Vitals:   07/08/22 1830 07/08/22 1930 07/08/22 2036 07/08/22 2041  BP: (!) 155/70 135/63  (!) 167/72  Pulse:  90  94  Resp:  20  20  Temp:   97.6 F (36.4 C)   TempSrc:   Oral   SpO2:  97%  99%  Weight:      Height:       Constitutional: appears age-appropriate, NAD, calm, comfortable Eyes: PERRL, lids and conjunctivae normal ENMT: Mucous membranes are moist. Posterior pharynx clear of any exudate or lesions. Age-appropriate dentition. Hearing appropriate Neck: normal, supple, no masses, no thyromegaly Respiratory: clear to auscultation bilaterally, no wheezing, no crackles. Normal respiratory effort. No accessory muscle use.  Cardiovascular: Regular rate and rhythm, no murmurs / rubs / gallops. No extremity edema. 2+ pedal pulses. No carotid bruits.  Abdomen: no tenderness, no masses palpated, no hepatosplenomegaly. Bowel sounds positive.  Musculoskeletal: no clubbing / cyanosis. No joint deformity upper and lower extremities. Good ROM, no contractures, no atrophy. Normal muscle tone.  Skin: no rashes, lesions, ulcers. No induration Neurologic: Sensation intact. Strength 5/5 in all 4.  Psychiatric: Normal judgment and insight. Alert and oriented x 3. Normal mood.   EKG: independently reviewed, showing sinus rhythm with rate of 85, QTc 415  Chest x-ray on Admission: I personally reviewed and I agree with radiologist reading as below.  DG Chest Port 1 View  Result Date: 07/08/2022 CLINICAL DATA:  [id number] Dyspnea on exertion [id number] EXAM: PORTABLE CHEST 1 VIEW COMPARISON:  Chest x-ray 04/26/2022 FINDINGS: The heart and mediastinal contours are unchanged. Aortic calcification. Hyperinflation of the lungs. No focal consolidation. No pulmonary edema. No pleural effusion. No pneumothorax. No acute osseous abnormality. IMPRESSION: 1. No active  disease. 2. Aortic Atherosclerosis (ICD10-I70.0) and Emphysema (ICD10-J43.9). Electronically Signed   By: Iven Finn M.D.   On: 07/08/2022 19:07    Labs on Admission: I have personally reviewed following labs CBC: Recent Labs  Lab 07/08/22 1506  WBC 8.2  HGB 13.1  HCT 40.7  MCV 90.6  PLT [id number]   Basic Metabolic Panel: Recent Labs  Lab 07/08/22 1506  NA 126*  K 4.1  CL 90*  CO2 27  GLUCOSE 191*  BUN 22  CREATININE 0.73  CALCIUM 8.9   GFR: Estimated Creatinine Clearance: 48.6 mL/min (by C-G formula based on SCr of 0.73 mg/dL).  Liver Function Tests: Recent Labs  Lab 07/08/22 1506  AST 19  ALT 15  ALKPHOS 60  BILITOT 0.7  PROT 6.6  ALBUMIN 3.9   Urine analysis:    Component Value Date/Time   COLORURINE YELLOW (A) 04/25/2022 1857   APPEARANCEUR HAZY (A) 04/25/2022 1857   APPEARANCEUR Hazy (  A) 01/26/2021 1309   LABSPEC 1.011 04/25/2022 1857   PHURINE 5.0 04/25/2022 1857   GLUCOSEU NEGATIVE 04/25/2022 1857   GLUCOSEU NEGATIVE 02/29/2012 1116   HGBUR NEGATIVE 04/25/2022 1857   HGBUR large 01/01/2010 1204   BILIRUBINUR NEGATIVE 04/25/2022 1857   BILIRUBINUR Negative 01/26/2021 [place] 04/25/2022 [place] NEGATIVE 04/25/2022 1857   UROBILINOGEN 0.2 02/29/2012 1116   NITRITE NEGATIVE 04/25/2022 1857   LEUKOCYTESUR NEGATIVE 04/25/2022 1857   This document was prepared using Dragon Voice Recognition software and may include unintentional dictation errors.  Dr. Tobie Poet [facility]  If 7PM-7AM, please contact overnight-coverage provider If 7AM-7PM, please contact day coverage provider [email or web address]  07/08/2022, 9:57 PM

## 2022-07-08 NOTE — Hospital Course (Addendum)
Ms. Sara Stafford is a 77 year old female with history of hypertension, non-insulin-dependent diabetes mellitus, hyperlipidemia, anxiety, who presents emergency department for chief concerns of 'feeling like she has hyponatremia'.  Initial vitals in the ED showed temperature of 97.7, respiration rate of 16, heart rate of 90, blood pressure 144/80, SpO2 98% on room air.  Serum sodium is 126, potassium 4.1, chloride 90, bicarb 27, BUN of 22, serum creatinine of 0.72, eGFR greater than 60, nonfasting blood glucose 191, WBC 8.2, hemoglobin 13.1, platelets of 343.  ED treatment: None

## 2022-07-09 DIAGNOSIS — E0800 Diabetes mellitus due to underlying condition with hyperosmolarity without nonketotic hyperglycemic-hyperosmolar coma (NKHHC): Secondary | ICD-10-CM

## 2022-07-09 DIAGNOSIS — E871 Hypo-osmolality and hyponatremia: Secondary | ICD-10-CM | POA: Diagnosis not present

## 2022-07-09 DIAGNOSIS — I10 Essential (primary) hypertension: Secondary | ICD-10-CM | POA: Diagnosis not present

## 2022-07-09 LAB — BASIC METABOLIC PANEL
Anion gap: 3 — ABNORMAL LOW (ref 5–15)
BUN: 21 mg/dL (ref 8–23)
CO2: 25 mmol/L (ref 22–32)
Calcium: 8.7 mg/dL — ABNORMAL LOW (ref 8.9–10.3)
Chloride: 103 mmol/L (ref 98–111)
Creatinine, Ser: 0.69 mg/dL (ref 0.44–1.00)
GFR, Estimated: >60 mL/min (ref >60)
Glucose, Bld: 115 mg/dL — ABNORMAL HIGH (ref 70–99)
Potassium: 4.2 mmol/L (ref 3.5–5.1)
Sodium: 131 mmol/L — ABNORMAL LOW (ref 135–145)

## 2022-07-09 LAB — GLUCOSE, CAPILLARY
Glucose-Capillary: 129 mg/dL — ABNORMAL HIGH (ref 70–99)
Glucose-Capillary: 172 mg/dL — ABNORMAL HIGH (ref 70–99)

## 2022-07-09 LAB — CBC
HCT: 36 % (ref 36.0–46.0)
Hemoglobin: 11.8 g/dL — ABNORMAL LOW (ref 12.0–15.0)
MCH: 29.8 pg (ref 26.0–34.0)
MCHC: 32.8 g/dL (ref 30.0–36.0)
MCV: 90.9 fL (ref 80.0–100.0)
Platelets: 314 10*3/uL (ref 150–400)
RBC: 3.96 MIL/uL (ref 3.87–5.11)
RDW: 11.7 % (ref 11.5–15.5)
WBC: 5.6 10*3/uL (ref 4.0–10.5)
nRBC: 0 % (ref 0.0–0.2)

## 2022-07-09 MED ORDER — SODIUM CHLORIDE 1 G PO TABS: 1.0000 g | ORAL_TABLET | Freq: Two times a day (BID) | ORAL | 0 refills | Status: AC

## 2022-07-09 NOTE — Discharge Summary (Signed)
Physician Discharge Summary   Patient: Sara Stafford MRN: [id number] DOB: 1945/10/14  Admit date:     07/08/2022  Discharge date: 07/09/22  Discharge Physician: Sharen Hones   PCP: Rusty Aus, MD   Recommendations at discharge:   Follow-up with PCP in 1 week. Check a BMP at next office visit. Discontinue Effexor.  Discharge Diagnoses: Principal Problem:   Hyponatremia Active Problems:   Diabetes ([place])   Hyperlipidemia   Depression   Hypertension  Resolved Problems:   * No resolved hospital problems. [facility] Course: Sara Stafford is a 77 year old female with history of hypertension, non-insulin-dependent diabetes mellitus, hyperlipidemia, anxiety, who presents emergency department for chief concerns of 'feeling like she has hyponatremia'.  Initial vitals in the ED showed temperature of 97.7, respiration rate of 16, heart rate of 90, blood pressure 144/80, SpO2 98% on room air.  Serum sodium is 126, potassium 4.1, chloride 90, bicarb 27, BUN of 22, serum creatinine of 0.72, eGFR greater than 60, nonfasting blood glucose 191, WBC 8.2, hemoglobin 13.1, platelets of 343.   Assessment and Plan: * Hyponatremia Patient did not have any diarrhea nausea vomiting.  This appears to be due to Effexor.  She received normal saline, sodium level increased to 131.  Patient currently asymptomatic.  Medically stable to be discharged.  I will continue sodium chloride 1 g twice a day x 7 days, repeat a BMP in 1 week in office. Advised to discontinue Effexor.  Hypertension - Amlodipine 10 mg daily, metoprolol tartrate 25 mg p.o. twice daily resumed   Depression with anxiety. On benzodiazepine.  Hyperlipidemia - Rosuvastatin 20 mg nightly resumed  Diabetes ([place]) Home treatment.        Consultants: None Procedures performed: None  Disposition: Home Diet recommendation:  Discharge Diet Orders (From admission, onward)     Start     Ordered   07/09/22 0000   Diet general       Comments: Low fat diet, no restriction to salt   07/09/22 1023           Regular diet DISCHARGE MEDICATION: Allergies as of 07/09/2022       Reactions   Empagliflozin Nausea And Vomiting, Nausea Only   Ozempic (0.25 Or 0.5 Mg-dose) [semaglutide(0.25 Or 0.52m-dos)] Nausea And Vomiting   Semaglutide Nausea Only   Codeine Nausea And Vomiting   Doxycycline Other (See Comments)   REACTION: thrush thrush        Medication List     TAKE these medications    ALPRAZolam 0.25 MG tablet Commonly known as: XANAX Take 0.125 mg by mouth 2 (two) times daily.   amLODipine 10 MG tablet Commonly known as: NORVASC Take 1 tablet (10 mg total) by mouth daily.   glipiZIDE 10 MG 24 hr tablet Commonly known as: GLUCOTROL XL TAKE (2) TABLETS BY MOUTH ONCE DAILY. What changed: See the new instructions.   metFORMIN 750 MG 24 hr tablet Commonly known as: GLUCOPHAGE-XR Take 750 mg by mouth every evening.   metoprolol tartrate 25 MG tablet Commonly known as: LOPRESSOR Take 1 tablet (25 mg total) by mouth 2 (two) times daily.   omeprazole 20 MG capsule Commonly known as: PRILOSEC TAKE (1) CAPSULE BY MOUTH ONCE DAILY AS NEEDED. What changed: See the new instructions.   PreserVision/Lutein Caps Take 1 capsule by mouth as directed.   rosuvastatin 20 MG tablet Commonly known as: CRESTOR Take 20 mg by mouth at bedtime.   sitaGLIPtin 50 MG tablet Commonly known  as: JANUVIA Take 50 mg by mouth daily.   sodium chloride 1 g tablet Take 1 tablet (1 g total) by mouth 2 (two) times daily with a meal for 7 days.   Ventolin HFA 108 (90 Base) MCG/ACT inhaler Generic drug: albuterol INHALE 2 PUFFS BY MOUTH EVERY 4 HOURS AS NEEDED        Follow-up Information     Rusty Aus, MD Follow up in 1 week(s).   Specialty: Internal Medicine Contact information: [place]  [id number] [phone]                 Discharge Exam: Danley Danker Weights   07/08/22 1459  Weight: 53.1 kg   General exam: Appears calm and comfortable  Respiratory system: Clear to auscultation. Respiratory effort normal. Cardiovascular system: S1 & S2 heard, RRR. No JVD, murmurs, rubs, gallops or clicks. No pedal edema. Gastrointestinal system: Abdomen is nondistended, soft and nontender. No organomegaly or masses felt. Normal bowel sounds heard. Central nervous system: Alert and oriented. No focal neurological deficits. Extremities: Symmetric 5 x 5 power. Skin: No rashes, lesions or ulcers Psychiatry: Judgement and insight appear normal. Mood & affect appropriate.    Condition at discharge: good  The results of significant diagnostics from this hospitalization (including imaging, microbiology, ancillary and laboratory) are listed below for reference.   Imaging Studies: DG Chest Port 1 View  Result Date: 07/08/2022 CLINICAL DATA:  [id number] Dyspnea on exertion [id number] EXAM: PORTABLE CHEST 1 VIEW COMPARISON:  Chest x-ray 04/26/2022 FINDINGS: The heart and mediastinal contours are unchanged. Aortic calcification. Hyperinflation of the lungs. No focal consolidation. No pulmonary edema. No pleural effusion. No pneumothorax. No acute osseous abnormality. IMPRESSION: 1. No active disease. 2. Aortic Atherosclerosis (ICD10-I70.0) and Emphysema (ICD10-J43.9). Electronically Signed   By: Iven Finn M.D.   On: 07/08/2022 19:07    Microbiology: Results for orders placed or performed in visit on 01/26/21  Microscopic Examination     Status: Abnormal   Collection Time: 01/26/21  1:09 PM   Urine  Result Value Ref Range Status   WBC, UA 0-5 0 - 5 /hpf Final   RBC, Urine 0-2 0 - 2 /hpf Final   Epithelial Cells (non renal) 0-10 0 - 10 /hpf Final   Casts Present (A) None seen /lpf Final   Cast Type Hyaline casts N/A Final   Bacteria, UA Few None seen/Few Final    Labs: CBC: Recent Labs  Lab 07/08/22 1506 07/09/22 0503  WBC  8.2 5.6  HGB 13.1 11.8*  HCT 40.7 36.0  MCV 90.6 90.9  PLT 343 [id number]   Basic Metabolic Panel: Recent Labs  Lab 07/08/22 1506 07/09/22 0503  NA 126* 131*  K 4.1 4.2  CL 90* 103  CO2 27 25  GLUCOSE 191* 115*  BUN 22 21  CREATININE 0.73 0.69  CALCIUM 8.9 8.7*   Liver Function Tests: Recent Labs  Lab 07/08/22 1506  AST 19  ALT 15  ALKPHOS 60  BILITOT 0.7  PROT 6.6  ALBUMIN 3.9   CBG: Recent Labs  Lab 07/08/22 2239 07/09/22 0734  GLUCAP 256* 129*    Discharge time spent: greater than 30 minutes.  Signed: Sharen Hones, MD [facility] 07/09/2022

## 2023-06-24 ENCOUNTER — Other Ambulatory Visit: Payer: Self-pay

## 2023-06-24 ENCOUNTER — Emergency Department (HOSPITAL_COMMUNITY): Payer: Medicare Other

## 2023-06-24 ENCOUNTER — Encounter (HOSPITAL_COMMUNITY): Payer: Self-pay

## 2023-06-24 ENCOUNTER — Inpatient Hospital Stay (HOSPITAL_COMMUNITY)
Admission: EM | Admit: 2023-06-24 | Discharge: 2023-07-03 | DRG: 643 | Disposition: A | Discharge status: Skilled Nursing Facility | Payer: Medicare Other | Attending: Family Medicine | Admitting: Family Medicine

## 2023-06-24 DIAGNOSIS — E46 Unspecified protein-calorie malnutrition: Secondary | ICD-10-CM | POA: Insufficient documentation

## 2023-06-24 DIAGNOSIS — K219 Gastro-esophageal reflux disease without esophagitis: Secondary | ICD-10-CM | POA: Diagnosis present

## 2023-06-24 DIAGNOSIS — Z90710 Acquired absence of both cervix and uterus: Secondary | ICD-10-CM

## 2023-06-24 DIAGNOSIS — Z5912 Inadequate housing utilities: Secondary | ICD-10-CM | POA: Diagnosis not present

## 2023-06-24 DIAGNOSIS — E871 Hypo-osmolality and hyponatremia: Secondary | ICD-10-CM | POA: Diagnosis present

## 2023-06-24 DIAGNOSIS — D75839 Thrombocytosis, unspecified: Secondary | ICD-10-CM | POA: Diagnosis present

## 2023-06-24 DIAGNOSIS — M797 Fibromyalgia: Secondary | ICD-10-CM | POA: Diagnosis present

## 2023-06-24 DIAGNOSIS — F419 Anxiety disorder, unspecified: Secondary | ICD-10-CM | POA: Diagnosis present

## 2023-06-24 DIAGNOSIS — Z808 Family history of malignant neoplasm of other organs or systems: Secondary | ICD-10-CM

## 2023-06-24 DIAGNOSIS — R7989 Other specified abnormal findings of blood chemistry: Secondary | ICD-10-CM | POA: Insufficient documentation

## 2023-06-24 DIAGNOSIS — E782 Mixed hyperlipidemia: Secondary | ICD-10-CM | POA: Diagnosis present

## 2023-06-24 DIAGNOSIS — Z800 Family history of malignant neoplasm of digestive organs: Secondary | ICD-10-CM

## 2023-06-24 DIAGNOSIS — E222 Syndrome of inappropriate secretion of antidiuretic hormone: Principal | ICD-10-CM | POA: Diagnosis present

## 2023-06-24 DIAGNOSIS — Z885 Allergy status to narcotic agent status: Secondary | ICD-10-CM

## 2023-06-24 DIAGNOSIS — Z881 Allergy status to other antibiotic agents status: Secondary | ICD-10-CM

## 2023-06-24 DIAGNOSIS — F32A Depression, unspecified: Secondary | ICD-10-CM | POA: Diagnosis present

## 2023-06-24 DIAGNOSIS — Z823 Family history of stroke: Secondary | ICD-10-CM

## 2023-06-24 DIAGNOSIS — E875 Hyperkalemia: Secondary | ICD-10-CM | POA: Diagnosis not present

## 2023-06-24 DIAGNOSIS — R5381 Other malaise: Secondary | ICD-10-CM | POA: Diagnosis present

## 2023-06-24 DIAGNOSIS — B9789 Other viral agents as the cause of diseases classified elsewhere: Secondary | ICD-10-CM | POA: Diagnosis present

## 2023-06-24 DIAGNOSIS — Z1152 Encounter for screening for COVID-19: Secondary | ICD-10-CM | POA: Diagnosis not present

## 2023-06-24 DIAGNOSIS — E861 Hypovolemia: Secondary | ICD-10-CM | POA: Diagnosis present

## 2023-06-24 DIAGNOSIS — Z794 Long term (current) use of insulin: Secondary | ICD-10-CM | POA: Diagnosis not present

## 2023-06-24 DIAGNOSIS — I5021 Acute systolic (congestive) heart failure: Secondary | ICD-10-CM | POA: Diagnosis not present

## 2023-06-24 DIAGNOSIS — E8770 Fluid overload, unspecified: Secondary | ICD-10-CM | POA: Diagnosis present

## 2023-06-24 DIAGNOSIS — Z833 Family history of diabetes mellitus: Secondary | ICD-10-CM | POA: Diagnosis not present

## 2023-06-24 DIAGNOSIS — I10 Essential (primary) hypertension: Secondary | ICD-10-CM | POA: Diagnosis present

## 2023-06-24 DIAGNOSIS — D649 Anemia, unspecified: Secondary | ICD-10-CM | POA: Diagnosis present

## 2023-06-24 DIAGNOSIS — D72829 Elevated white blood cell count, unspecified: Secondary | ICD-10-CM | POA: Diagnosis present

## 2023-06-24 DIAGNOSIS — E1141 Type 2 diabetes mellitus with diabetic mononeuropathy: Secondary | ICD-10-CM | POA: Diagnosis present

## 2023-06-24 DIAGNOSIS — E1165 Type 2 diabetes mellitus with hyperglycemia: Secondary | ICD-10-CM | POA: Diagnosis present

## 2023-06-24 DIAGNOSIS — Z825 Family history of asthma and other chronic lower respiratory diseases: Secondary | ICD-10-CM

## 2023-06-24 DIAGNOSIS — J441 Chronic obstructive pulmonary disease with (acute) exacerbation: Secondary | ICD-10-CM | POA: Diagnosis present

## 2023-06-24 DIAGNOSIS — I2489 Other forms of acute ischemic heart disease: Secondary | ICD-10-CM | POA: Diagnosis present

## 2023-06-24 DIAGNOSIS — Z7984 Long term (current) use of oral hypoglycemic drugs: Secondary | ICD-10-CM | POA: Diagnosis not present

## 2023-06-24 DIAGNOSIS — J9601 Acute respiratory failure with hypoxia: Secondary | ICD-10-CM | POA: Diagnosis present

## 2023-06-24 DIAGNOSIS — Z79899 Other long term (current) drug therapy: Secondary | ICD-10-CM

## 2023-06-24 DIAGNOSIS — E8809 Other disorders of plasma-protein metabolism, not elsewhere classified: Secondary | ICD-10-CM | POA: Diagnosis present

## 2023-06-24 DIAGNOSIS — H3530 Unspecified macular degeneration: Secondary | ICD-10-CM | POA: Diagnosis present

## 2023-06-24 DIAGNOSIS — Z888 Allergy status to other drugs, medicaments and biological substances status: Secondary | ICD-10-CM

## 2023-06-24 DIAGNOSIS — Z803 Family history of malignant neoplasm of breast: Secondary | ICD-10-CM

## 2023-06-24 DIAGNOSIS — Z87891 Personal history of nicotine dependence: Secondary | ICD-10-CM

## 2023-06-24 DIAGNOSIS — K5900 Constipation, unspecified: Secondary | ICD-10-CM | POA: Diagnosis present

## 2023-06-24 LAB — CBC WITH DIFFERENTIAL/PLATELET
Abs Immature Granulocytes: 0.31 10*3/uL — ABNORMAL HIGH (ref 0.00–0.07)
Basophils Absolute: 0 10*3/uL (ref 0.0–0.1)
Basophils Relative: 0 %
Eosinophils Absolute: 0.1 10*3/uL (ref 0.0–0.5)
Eosinophils Relative: 1 %
HCT: 28.7 % — ABNORMAL LOW (ref 36.0–46.0)
Hemoglobin: 10.3 g/dL — ABNORMAL LOW (ref 12.0–15.0)
Immature Granulocytes: 3 %
Lymphocytes Relative: 4 %
Lymphs Abs: 0.5 10*3/uL — ABNORMAL LOW (ref 0.7–4.0)
MCH: 29.3 pg (ref 26.0–34.0)
MCHC: 35.9 g/dL (ref 30.0–36.0)
MCV: 81.8 fL (ref 80.0–100.0)
Monocytes Absolute: 0.7 10*3/uL (ref 0.1–1.0)
Monocytes Relative: 5 %
Neutro Abs: 10.6 10*3/uL — ABNORMAL HIGH (ref 1.7–7.7)
Neutrophils Relative %: 87 %
Platelets: 467 10*3/uL — ABNORMAL HIGH (ref 150–400)
RBC: 3.51 MIL/uL — ABNORMAL LOW (ref 3.87–5.11)
RDW: 12.7 % (ref 11.5–15.5)
WBC: 12.2 10*3/uL — ABNORMAL HIGH (ref 4.0–10.5)
nRBC: 0 % (ref 0.0–0.2)

## 2023-06-24 LAB — COMPREHENSIVE METABOLIC PANEL
ALT: 21 U/L (ref 0–44)
AST: 21 U/L (ref 15–41)
Albumin: 2.7 g/dL — ABNORMAL LOW (ref 3.5–5.0)
Alkaline Phosphatase: 96 U/L (ref 38–126)
Anion gap: 13 (ref 5–15)
BUN: 12 mg/dL (ref 8–23)
CO2: 22 mmol/L (ref 22–32)
Calcium: 6.5 mg/dL — ABNORMAL LOW (ref 8.9–10.3)
Chloride: 80 mmol/L — ABNORMAL LOW (ref 98–111)
Creatinine, Ser: 0.5 mg/dL (ref 0.44–1.00)
GFR, Estimated: >60 mL/min (ref >60)
Glucose, Bld: 229 mg/dL — ABNORMAL HIGH (ref 70–99)
Potassium: 3.6 mmol/L (ref 3.5–5.1)
Sodium: 115 mmol/L — CL (ref 135–145)
Total Bilirubin: 0.4 mg/dL (ref 0.0–1.2)
Total Protein: 6.1 g/dL — ABNORMAL LOW (ref 6.5–8.1)

## 2023-06-24 LAB — RESP PANEL BY RT-PCR (RSV, FLU A&B, COVID)  RVPGX2
Influenza A by PCR: NEGATIVE
Influenza B by PCR: NEGATIVE
Resp Syncytial Virus by PCR: NEGATIVE
SARS Coronavirus 2 by RT PCR: NEGATIVE

## 2023-06-24 LAB — TROPONIN I (HIGH SENSITIVITY): Troponin I (High Sensitivity): 50 ng/L — ABNORMAL HIGH (ref <18)

## 2023-06-24 LAB — BRAIN NATRIURETIC PEPTIDE: B Natriuretic Peptide: 567 pg/mL — ABNORMAL HIGH (ref 0.0–100.0)

## 2023-06-24 MED ORDER — IPRATROPIUM-ALBUTEROL 0.5-2.5 (3) MG/3ML IN SOLN
3.0000 mL | Freq: Once | RESPIRATORY_TRACT | Status: AC
  Administered 2023-06-24: 3 mL via RESPIRATORY_TRACT
  Filled 2023-06-24: qty 3

## 2023-06-24 MED ORDER — SODIUM CHLORIDE 0.9 % IV SOLN: Freq: Once | INTRAVENOUS | Status: AC

## 2023-06-24 MED ORDER — ALBUTEROL SULFATE (2.5 MG/3ML) 0.083% IN NEBU
2.5000 mg | INHALATION_SOLUTION | Freq: Once | RESPIRATORY_TRACT | Status: AC
  Administered 2023-06-24: 2.5 mg via RESPIRATORY_TRACT
  Filled 2023-06-24: qty 3

## 2023-06-24 NOTE — H&P (Incomplete)
History and Physical    Patient: Sara Stafford ZOX:[id number] DOB: [birth year] DOA: 06/24/2023 DOS: the patient was seen and examined on 06/25/2023 PCP: Danella Penton, MD  Patient coming from: Home  Chief Complaint:  Chief Complaint  Patient presents with   Shortness of Breath   HPI: Sara Stafford is a 78 y.o. female with medical history significant of T2DM, hypertension, hyperlipidemia, depression who presents to the emergency department from home via EMS due to 1 week onset of shortness of breath.  Patient went to her PCP about 5 days ago and she was placed on 2 different antibiotics (azithromycin, Omnicef per medical record), patient completed azithromycin, however, she was unable to complete the [facility] due to worsening fatigue while taking the medication.  She complained of chest congestion, though cough appeared to have improved, she also complained of wheezing and has noticed slight ankle swelling.  Shortness of breath worsened tonight while trying to go to the bathroom.  EMS was activated, on arrival of EMS team, O2 sat was noted to be 85%, breathing treatment and IV Solu-Medrol was given with slight improvement in breathing efforts.  Supplemental oxygen was provided (patient does not use oxygen at baseline).  She denies chest pain Patient's husband recently passed on within the last week or so.  Patient states that she has not been eating much within the last 2 weeks, she has been drinking a lot of water at home and she endorsed having several days of diarrhea last week. She was admitted for hyponatremia in February 2024 and in November 2023  ED Course:  In the emergency department, respiratory rate was 27/min, BP 177/74, O2 sat was 95 to 100% on supplemental oxygen via [place] at 2 LPM, temperature 98.4 F, pulse 89 bpm.  Workup in the ED showed leukocytosis, normocytic anemia, thrombocytosis.  BMP showed sodium of 115, chloride 88, bicarb 22, blood glucose 229, BUN 12, creatinine  0.5, calcium 6.5, albumin 2.7, BNP 567.  Troponin 50 > 42, TSH 1.326.  Influenza A, B, SARS-CoV-2, RSV was negative. Chest x-ray showed increased central interstitial markings bilaterally may reflect atypical/viral infection.  No lung consolidation Breathing treatment was provided, IV hydration was given.  Hospitalist was asked to admit patient for further evaluation and management.  Review of Systems: Review of systems as noted in the HPI. All other systems reviewed and are negative.   Past Medical History:  Diagnosis Date   Arthritis    COPD (chronic obstructive pulmonary disease) (HCC)    no change with spiriva, poor tolerance of advair   Depressive disorder, not elsewhere classified    Diabetes mellitus    Fatigue    Fibromyalgia    GERD (gastroesophageal reflux disease)    Hyperlipidemia    Hypertension    Macular degeneration    Routine general medical examination at a health care facility    Past Surgical History:  Procedure Laterality Date   ABDOMINAL HYSTERECTOMY     partial   CATARACT EXTRACTION Left 02/09/2015   CATARACT EXTRACTION Right 01/26/2015   CHOLECYSTECTOMY N/A 02/16/2022   Procedure: LAPAROSCOPIC CHOLECYSTECTOMY WITH INTRAOPERATIVE CHOLANGIOGRAM;  Surgeon: Earline Mayotte, MD;  Location: [facility] ORS;  Service: General;  Laterality: N/ASonda Rumble, RNFA to assist   COLONOSCOPY WITH PROPOFOL N/A 04/28/2021   Procedure: COLONOSCOPY WITH PROPOFOL;  Surgeon: Earline Mayotte, MD;  Location: [facility] ENDOSCOPY;  Service: Endoscopy;  Laterality: N/A;   ESOPHAGOGASTRODUODENOSCOPY (EGD) WITH PROPOFOL N/A 04/28/2021   Procedure: ESOPHAGOGASTRODUODENOSCOPY (EGD) WITH  PROPOFOL;  Surgeon: Earline Mayotte, MD;  Location: [facility] ENDOSCOPY;  Service: Endoscopy;  Laterality: N/A;    Social History:  reports that she quit smoking about 13 years ago. Her smoking use included cigarettes. She started smoking about 63 years ago. She has a 50 pack-year smoking history. She has  never used smokeless tobacco. She reports that she does not drink alcohol and does not use drugs.   Allergies  Allergen Reactions   Empagliflozin Nausea And Vomiting and Nausea Only   Ozempic (0.25 Or 0.5 Mg-Dose) [Semaglutide(0.25 Or 0.5mg -Dos)] Nausea And Vomiting   Semaglutide Nausea Only   Codeine Nausea And Vomiting   Doxycycline Other (See Comments)    REACTION: thrush  thrush    Family History  Problem Relation Age of Onset   Stroke Father 42   Diabetes Father    Asthma Father    Liver cancer Sister 19   Pancreatic cancer Sister 65   Brain cancer Brother    Diabetes Sister    Diabetes Sister    Diabetes Sister    Diabetes Sister    Diabetes Sister    Diabetes Sister    Diabetes Sister    Diabetes Paternal Grandmother    Breast cancer Maternal Aunt 70     Prior to Admission medications   Medication Sig Start Date End Date Taking? Authorizing Provider  ALPRAZolam (XANAX) 0.25 MG tablet Take 0.125 mg by mouth 2 (two) times daily.    [provider]  amLODipine (NORVASC) 10 MG tablet Take 1 tablet (10 mg total) by mouth daily. 04/29/22   Burnadette Pop, MD  glipiZIDE (GLUCOTROL XL) 10 MG 24 hr tablet TAKE (2) TABLETS BY MOUTH ONCE DAILY. Patient taking differently: Take 10 mg by mouth 2 (two) times daily. 11/02/17   Doreene Nest, NP  metFORMIN (GLUCOPHAGE-XR) 750 MG 24 hr tablet Take 750 mg by mouth every evening.    [provider]  metoprolol tartrate (LOPRESSOR) 25 MG tablet Take 1 tablet (25 mg total) by mouth 2 (two) times daily. 04/28/22   Burnadette Pop, MD  Multiple Vitamins-Minerals (PRESERVISION/LUTEIN) CAPS Take 1 capsule by mouth as directed.    [provider]  omeprazole (PRILOSEC) 20 MG capsule TAKE (1) CAPSULE BY MOUTH ONCE DAILY AS NEEDED. Patient taking differently: Take 20 mg by mouth daily. 08/01/17   Doreene Nest, NP  rosuvastatin (CRESTOR) 20 MG tablet Take 20 mg by mouth at bedtime.    [provider]   sitaGLIPtin (JANUVIA) 50 MG tablet Take 50 mg by mouth daily.    [provider]  VENTOLIN HFA 108 (90 Base) MCG/ACT inhaler INHALE 2 PUFFS BY MOUTH EVERY 4 HOURS AS NEEDED 06/21/16   Dianne Dun, MD  omeprazole (PRILOSEC OTC) 20 MG tablet Take 1 tablet (20 mg total) by mouth daily as needed. 02/29/12 10/15/12  Dianne Dun, MD    Physical Exam: BP (!) 186/87   Pulse (!) 110   Temp 98 F (36.7 C) (Oral)   Resp (!) 33   Ht 5\' 5"  (1.651 m)   Wt 59.2 kg   SpO2 97%   BMI 21.72 kg/m   General: 78 y.o. year-old female well developed well nourished in no acute distress.  Alert and oriented x3. HEENT: NCAT, EOMI Neck: Supple, trachea medial Cardiovascular: Regular rate and rhythm with no rubs or gallops.  No thyromegaly or JVD noted.  Mild ankle edema bilaterally. 2/4 pulses in all 4 extremities. Respiratory: Tachypnea.  Clear to  auscultation with no wheezes or rales. Good inspiratory effort. Abdomen: Soft, nontender nondistended with normal bowel sounds x4 quadrants. Muskuloskeletal: No cyanosis, clubbing noted bilaterally Neuro: CN II-XII intact, strength 5/5 x 4, sensation, reflexes intact Skin: No ulcerative lesions noted or rashes Psychiatry: Judgement and insight appear normal. Mood is appropriate for condition and setting          Labs on Admission:  Basic Metabolic Panel: Recent Labs  Lab 06/24/23 2223  NA 115*  K 3.6  CL 80*  CO2 22  GLUCOSE 229*  BUN 12  CREATININE 0.50  CALCIUM 6.5*   Liver Function Tests: Recent Labs  Lab 06/24/23 2223  AST 21  ALT 21  ALKPHOS 96  BILITOT 0.4  PROT 6.1*  ALBUMIN 2.7*   No results for input(s): "LIPASE", "AMYLASE" in the last 168 hours. No results for input(s): "AMMONIA" in the last 168 hours. CBC: Recent Labs  Lab 06/24/23 2223  WBC 12.2*  NEUTROABS 10.6*  HGB 10.3*  HCT 28.7*  MCV 81.8  PLT 467*   Cardiac Enzymes: No results for input(s): "CKTOTAL", "CKMB", "CKMBINDEX", "TROPONINI" in the last 168  hours.  BNP (last 3 results) Recent Labs    06/24/23 2223  BNP 567.0*    ProBNP (last 3 results) No results for input(s): "PROBNP" in the last 8760 hours.  CBG: Recent Labs  Lab 06/25/23 0104 06/25/23 0258  GLUCAP 411* 337*    Radiological Exams on Admission: DG Chest 2 View Result Date: 06/24/2023 CLINICAL DATA:  Cough and dyspnea EXAM: CHEST - 2 VIEW COMPARISON:  None Available. FINDINGS: There are increased central interstitial markings bilaterally. There is no lung consolidation, pleural effusion or pneumothorax. The cardiomediastinal silhouette is within normal limits. No acute fractures are seen. IMPRESSION: Increased central interstitial markings bilaterally may reflect atypical/viral infection. No lung consolidation. Electronically Signed   By: Darliss Cheney M.D.   On: 06/24/2023 22:58    EKG: I independently viewed the EKG done and my findings are as followed: Normal sinus rhythm at rate of 95 bpm  Assessment/Plan Present on Admission:  Hyponatremia  Acute exacerbation of chronic obstructive pulmonary disease (COPD) (HCC)  Essential hypertension  Mixed hyperlipidemia  Principal Problem:   Hyponatremia Active Problems:   Mixed hyperlipidemia   Acute exacerbation of chronic obstructive pulmonary disease (COPD) (HCC)   Essential hypertension   Acute respiratory failure with hypoxia (HCC)   Elevated troponin   Elevated brain natriuretic peptide (BNP) level   Thrombocytosis   Type 2 diabetes mellitus with hyperglycemia (HCC)   GERD (gastroesophageal reflux disease)  Hyponatremia Sodium 115, this appears to be dilutional Patient received some IV hydration, continue sodium tabs Patient is to require IV fluid which will be reasonable once echo rules out any possibility of CHF Continue to monitor sodium with serial BMPs Urine osmolality, serum osmolality and urine sodium will be checked  Acute exacerbation of COPD Continue Atrovent, Mucinex, Solu-Medrol,  azithromycin. Continue Protonix to prevent steroid-induced ulcer Continue incentive spirometry and flutter valve Continue supplemental oxygen to maintain O2 sat > 92% with plan to wean patient off oxygen as tolerated  Acute respiratory failure with hypoxia Continue supplemental oxygen to maintain O2 sat > 92% with plan to wean patient off this as tolerated (patient does not use supplemental oxygen at baseline).  Elevated BNP BNP 567, patient denies leg swelling or increased abdominal girth other than bilateral ankle edema Chest x-ray was suggestive of atypical/viral infection with no lung consolidation. Continue total input/output, daily weights and  fluid restriction Continue heart healthy diet  Echocardiogram in the morning   Elevated troponin possibly secondary to type II demand ischemia Troponin 50 > 42; troponin has flattened Patient denies chest pain  Hypocalcemia Corrected calcium level based on albumin of 2.7 is 7.5 Continue Os-Cal  Hypoalbuminemia Albumin 2.7, protein supplement will be provided  Thrombocytosis possibly reactive Platelets 467, continue to monitor platelet levels  Leukocytosis possibly reactive vs Infective Chest x-ray suggested atypical/viral infection; respiratory panel was negative Continue supportive care  Essential hypertension Continue amlodipine, metoprolol  Mixed hyperlipidemia Continue Crestor  Type 2 diabetes mellitus Continue ISS and hypoglycemia protocol Metformin and glipizide will be held at this time  GERD Continue Protonix  DVT prophylaxis: Lovenox  Code Status: Full code  Family Communication: None  Consults: None  Severity of Illness: The appropriate patient status for this patient is INPATIENT. Inpatient status is judged to be reasonable and necessary in order to provide the required intensity of service to ensure the patient's safety. The patient's presenting symptoms, physical exam findings, and initial radiographic  and laboratory data in the context of their chronic comorbidities is felt to place them at high risk for further clinical deterioration. Furthermore, it is not anticipated that the patient will be medically stable for discharge from the hospital within 2 midnights of admission.   * I certify that at the point of admission it is my clinical judgment that the patient will require inpatient hospital care spanning beyond 2 midnights from the point of admission due to high intensity of service, high risk for further deterioration and high frequency of surveillance required.*  Author: Frankey Shown, DO 06/25/2023 4:01 AM  For on call review [email or web address].

## 2023-06-24 NOTE — ED Notes (Signed)
Gave pt sandwich and drink per ok from EDP

## 2023-06-24 NOTE — ED Notes (Signed)
Patient transported to X-ray

## 2023-06-24 NOTE — ED Triage Notes (Signed)
Pt to ED from home via [facility] cc shortness of breath for 1 week, worse tonight. Was seen by pcp earlier this week, diagnosed with congestion and given antibiotics per pt. Did not finish taking them because they made her "feel funny". Hx of COPD and htn. 85% on room air when EMS arrived to home, placed on 3l and spo2 99% on arrival to ED. Does not require home o2. EMS admin 2 duonebs and 125 solumedrol. Endorses anxiety due to her husband passing away this week.

## 2023-06-24 NOTE — ED Notes (Signed)
Room air spo2 96% at this time

## 2023-06-24 NOTE — ED Notes (Signed)
Date and time results received: 06/24/23 2309   Test: Na+ Critical Value: 115  Name of Provider Notified: Criss Alvine  Orders Received? Or Actions Taken?: MD informed.   [place] RN

## 2023-06-24 NOTE — ED Provider Notes (Signed)
[place] EMERGENCY DEPARTMENT AT [facility] Provider Note   CSN: [id number] Arrival date & time: 06/24/23  2207     History  Chief Complaint  Patient presents with   Shortness of Breath    Sara Stafford is a 78 y.o. female.  HPI 78 year old female presents with dyspnea. She has been having about a week worth of symptoms.  Saw her doctor about 5 days ago, put on 2 different antibiotics including azithromycin (chart review shows the other was [facility]).  Finish the azithromycin but stop the [facility] because she felt like her fatigue was getting worse and she thought it was the medicine.  She has been having wheezing.  Cough seems to be better but she is feeling congested and has noticed a little bit of ankle swelling and her shortness of breath got worse tonight when she tried to walk to the bathroom.  No chest pain.  EMS found her O2 sats to be about 85%.  She is feeling a little better after the gave her Solu-Medrol and some breathing treatments. Does not wear oxygen at baseline.   Home Medications Prior to Admission medications   Medication Sig Start Date End Date Taking? Authorizing Provider  ALPRAZolam (XANAX) 0.25 MG tablet Take 0.125 mg by mouth 2 (two) times daily.    [provider]  amLODipine (NORVASC) 10 MG tablet Take 1 tablet (10 mg total) by mouth daily. 04/29/22   Burnadette Pop, MD  glipiZIDE (GLUCOTROL XL) 10 MG 24 hr tablet TAKE (2) TABLETS BY MOUTH ONCE DAILY. Patient taking differently: Take 10 mg by mouth 2 (two) times daily. 11/02/17   Doreene Nest, NP  metFORMIN (GLUCOPHAGE-XR) 750 MG 24 hr tablet Take 750 mg by mouth every evening.    [provider]  metoprolol tartrate (LOPRESSOR) 25 MG tablet Take 1 tablet (25 mg total) by mouth 2 (two) times daily. 04/28/22   Burnadette Pop, MD  Multiple Vitamins-Minerals (PRESERVISION/LUTEIN) CAPS Take 1 capsule by mouth as directed.    [provider]  omeprazole (PRILOSEC)  20 MG capsule TAKE (1) CAPSULE BY MOUTH ONCE DAILY AS NEEDED. Patient taking differently: Take 20 mg by mouth daily. 08/01/17   Doreene Nest, NP  rosuvastatin (CRESTOR) 20 MG tablet Take 20 mg by mouth at bedtime.    [provider]  sitaGLIPtin (JANUVIA) 50 MG tablet Take 50 mg by mouth daily.    [provider]  VENTOLIN HFA 108 (90 Base) MCG/ACT inhaler INHALE 2 PUFFS BY MOUTH EVERY 4 HOURS AS NEEDED 06/21/16   Dianne Dun, MD  omeprazole (PRILOSEC OTC) 20 MG tablet Take 1 tablet (20 mg total) by mouth daily as needed. 02/29/12 10/15/12  Dianne Dun, MD      Allergies    Empagliflozin, Ozempic (0.25 or 0.5 mg-dose) [semaglutide(0.25 or 0.5mg -dos)], Semaglutide, Codeine, and Doxycycline    Review of Systems   Review of Systems  Constitutional:  Negative for fever.  HENT:  Positive for congestion.   Respiratory:  Positive for cough and shortness of breath.   Cardiovascular:  Positive for leg swelling. Negative for chest pain.    Physical Exam Updated Vital Signs BP (!) 176/84   Pulse 95   Temp 98.4 F (36.9 C) (Oral)   Resp (!) 26   Ht 5\' 5"  (1.651 m)   Wt 53.1 kg   SpO2 100%   BMI 19.47 kg/m  Physical Exam Vitals and nursing note reviewed.  Constitutional:  Appearance: She is well-developed.  HENT:     Head: Normocephalic and atraumatic.  Cardiovascular:     Rate and Rhythm: Normal rate and regular rhythm.     Heart sounds: Normal heart sounds.  Pulmonary:     Effort: Pulmonary effort is normal. Tachypnea present. No accessory muscle usage or respiratory distress.     Breath sounds: Decreased breath sounds and wheezing present.  Abdominal:     Palpations: Abdomen is soft.     Tenderness: There is no abdominal tenderness.  Musculoskeletal:     Comments: Mild ankle edema bilaterally.  Skin:    General: Skin is warm and dry.  Neurological:     Mental Status: She is alert.     ED Results / Procedures / Treatments   Labs (all labs  ordered are listed, but only abnormal results are displayed) Labs Reviewed  COMPREHENSIVE METABOLIC PANEL - Abnormal; Notable for the following components:      Result Value   Sodium 115 (*)    Chloride 80 (*)    Glucose, Bld 229 (*)    Calcium 6.5 (*)    Total Protein 6.1 (*)    Albumin 2.7 (*)    All other components within normal limits  BRAIN NATRIURETIC PEPTIDE - Abnormal; Notable for the following components:   B Natriuretic Peptide 567.0 (*)    All other components within normal limits  CBC WITH DIFFERENTIAL/PLATELET - Abnormal; Notable for the following components:   WBC 12.2 (*)    RBC 3.51 (*)    Hemoglobin 10.3 (*)    HCT 28.7 (*)    Platelets 467 (*)    Neutro Abs 10.6 (*)    Lymphs Abs 0.5 (*)    Abs Immature Granulocytes 0.31 (*)    All other components within normal limits  TROPONIN I (HIGH SENSITIVITY) - Abnormal; Notable for the following components:   Troponin I (High Sensitivity) 50 (*)    All other components within normal limits  RESP PANEL BY RT-PCR (RSV, FLU A&B, COVID)  RVPGX2  OSMOLALITY  NA AND K (SODIUM & POTASSIUM), RAND UR  OSMOLALITY, URINE  TSH    EKG EKG Interpretation Date/Time:  Saturday June 24 2023 22:20:49 EST Ventricular Rate:  95 PR Interval:  182 QRS Duration:  79 QT Interval:  357 QTC Calculation: 449 R Axis:   67  Text Interpretation: Sinus rhythm Anteroseptal infarct, old no acute ST/T changes similar to Feb 2024 Confirmed by Pricilla Loveless [phone]) on 06/24/2023 10:27:37 PM  Radiology DG Chest 2 View Result Date: 06/24/2023 CLINICAL DATA:  Cough and dyspnea EXAM: CHEST - 2 VIEW COMPARISON:  None Available. FINDINGS: There are increased central interstitial markings bilaterally. There is no lung consolidation, pleural effusion or pneumothorax. The cardiomediastinal silhouette is within normal limits. No acute fractures are seen. IMPRESSION: Increased central interstitial markings bilaterally may reflect atypical/viral  infection. No lung consolidation. Electronically Signed   By: Darliss Cheney M.D.   On: 06/24/2023 22:58    Procedures .Critical Care  Performed by: Pricilla Loveless, MD Authorized by: Pricilla Loveless, MD   Critical care provider statement:    Critical care time (minutes):  35   Critical care time was exclusive of:  Separately billable procedures and treating other patients   Critical care was necessary to treat or prevent imminent or life-threatening deterioration of the following conditions:  Metabolic crisis   Critical care was time spent personally by me on the following activities:  Development of treatment plan with  patient or surrogate, discussions with consultants, evaluation of patient's response to treatment, examination of patient, ordering and review of laboratory studies, ordering and review of radiographic studies, ordering and performing treatments and interventions, pulse oximetry, re-evaluation of patient's condition and review of old charts     Medications Ordered in ED Medications  ipratropium-albuterol (DUONEB) 0.5-2.5 (3) MG/3ML nebulizer solution 3 mL (3 mLs Nebulization Given 06/24/23 2257)  albuterol (PROVENTIL) (2.5 MG/3ML) 0.083% nebulizer solution 2.5 mg (2.5 mg Nebulization Given 06/24/23 2257)    ED Course/ Medical Decision Making/ A&P                                 Medical Decision Making Amount and/or Complexity of Data Reviewed Labs: ordered.    Details: Hyponatremia 115 Radiology: ordered and independent interpretation performed.    Details: No lobar pneumonia ECG/medicine tests: ordered and independent interpretation performed.    Details: No ischemia  Risk Prescription drug management. Decision regarding hospitalization.   Presents with COPD exacerbation and recurrent hyponatremia.  She states that she is no longer on Effexor but is on Paxil.  This could be the cause of her hyponatremia.  She is having a little bit of generalized weakness but  has intact strength on exam and no altered mental status.  Chart review shows she has typically responded to removing the offending agent and starting on gentle fluids.  From a COPD perspective she is a lot better though a little shaky after the albuterol but now on room air.  X-ray without lobar pneumonia.  Flu/COVID test is pending.  Of note her troponin is a little elevated which might be from an elevated BNP as she does not have any chest pain.  Could also be from some hypoxia she had prior to arrival.  She will need a second.  Given the improvement with albuterol I doubt PE. Discussed with Dr. Thomes Dinning for admission. Will start some gentle normal saline after discussion with Dr. Thomes Dinning.        Final Clinical Impression(s) / ED Diagnoses Final diagnoses:  Hyponatremia  COPD exacerbation [facility])    Rx / DC Orders ED Discharge Orders     None         Pricilla Loveless, MD 06/24/23 2334

## 2023-06-25 ENCOUNTER — Inpatient Hospital Stay (HOSPITAL_COMMUNITY): Payer: Medicare Other

## 2023-06-25 ENCOUNTER — Encounter (HOSPITAL_COMMUNITY): Payer: Self-pay | Admitting: Internal Medicine

## 2023-06-25 DIAGNOSIS — E871 Hypo-osmolality and hyponatremia: Secondary | ICD-10-CM | POA: Diagnosis not present

## 2023-06-25 DIAGNOSIS — J9601 Acute respiratory failure with hypoxia: Secondary | ICD-10-CM | POA: Insufficient documentation

## 2023-06-25 DIAGNOSIS — I5021 Acute systolic (congestive) heart failure: Secondary | ICD-10-CM | POA: Diagnosis not present

## 2023-06-25 DIAGNOSIS — E1165 Type 2 diabetes mellitus with hyperglycemia: Secondary | ICD-10-CM | POA: Insufficient documentation

## 2023-06-25 DIAGNOSIS — J441 Chronic obstructive pulmonary disease with (acute) exacerbation: Secondary | ICD-10-CM

## 2023-06-25 DIAGNOSIS — E46 Unspecified protein-calorie malnutrition: Secondary | ICD-10-CM | POA: Insufficient documentation

## 2023-06-25 DIAGNOSIS — R7989 Other specified abnormal findings of blood chemistry: Secondary | ICD-10-CM | POA: Diagnosis not present

## 2023-06-25 DIAGNOSIS — D75839 Thrombocytosis, unspecified: Secondary | ICD-10-CM | POA: Insufficient documentation

## 2023-06-25 DIAGNOSIS — K219 Gastro-esophageal reflux disease without esophagitis: Secondary | ICD-10-CM | POA: Insufficient documentation

## 2023-06-25 LAB — GLUCOSE, CAPILLARY
Glucose-Capillary: 411 mg/dL — ABNORMAL HIGH (ref 70–99)
Glucose-Capillary: 337 mg/dL — ABNORMAL HIGH (ref 70–99)
Glucose-Capillary: 322 mg/dL — ABNORMAL HIGH (ref 70–99)
Glucose-Capillary: 409 mg/dL — ABNORMAL HIGH (ref 70–99)
Glucose-Capillary: 372 mg/dL — ABNORMAL HIGH (ref 70–99)
Glucose-Capillary: 253 mg/dL — ABNORMAL HIGH (ref 70–99)
Glucose-Capillary: 172 mg/dL — ABNORMAL HIGH (ref 70–99)
Glucose-Capillary: 121 mg/dL — ABNORMAL HIGH (ref 70–99)
Glucose-Capillary: 200 mg/dL — ABNORMAL HIGH (ref 70–99)
Glucose-Capillary: 184 mg/dL — ABNORMAL HIGH (ref 70–99)
Glucose-Capillary: 358 mg/dL — ABNORMAL HIGH (ref 70–99)

## 2023-06-25 LAB — BASIC METABOLIC PANEL
Anion gap: 12 (ref 5–15)
Anion gap: 12 (ref 5–15)
Anion gap: 12 (ref 5–15)
BUN: 15 mg/dL (ref 8–23)
BUN: 17 mg/dL (ref 8–23)
BUN: 21 mg/dL (ref 8–23)
CO2: 24 mmol/L (ref 22–32)
CO2: 24 mmol/L (ref 22–32)
CO2: 23 mmol/L (ref 22–32)
Calcium: 7 mg/dL — ABNORMAL LOW (ref 8.9–10.3)
Calcium: 7.2 mg/dL — ABNORMAL LOW (ref 8.9–10.3)
Calcium: 7.6 mg/dL — ABNORMAL LOW (ref 8.9–10.3)
Chloride: 78 mmol/L — ABNORMAL LOW (ref 98–111)
Chloride: 79 mmol/L — ABNORMAL LOW (ref 98–111)
Chloride: 81 mmol/L — ABNORMAL LOW (ref 98–111)
Creatinine, Ser: 0.65 mg/dL (ref 0.44–1.00)
Creatinine, Ser: 0.8 mg/dL (ref 0.44–1.00)
Creatinine, Ser: 0.76 mg/dL (ref 0.44–1.00)
GFR, Estimated: >60 mL/min (ref >60)
GFR, Estimated: >60 mL/min (ref >60)
GFR, Estimated: >60 mL/min (ref >60)
Glucose, Bld: 186 mg/dL — ABNORMAL HIGH (ref 70–99)
Glucose, Bld: 217 mg/dL — ABNORMAL HIGH (ref 70–99)
Glucose, Bld: 306 mg/dL — ABNORMAL HIGH (ref 70–99)
Potassium: 3.9 mmol/L (ref 3.5–5.1)
Potassium: 4.2 mmol/L (ref 3.5–5.1)
Potassium: 4.5 mmol/L (ref 3.5–5.1)
Sodium: 114 mmol/L — CL (ref 135–145)
Sodium: 115 mmol/L — CL (ref 135–145)
Sodium: 116 mmol/L — CL (ref 135–145)

## 2023-06-25 LAB — CBC
HCT: 28.6 % — ABNORMAL LOW (ref 36.0–46.0)
Hemoglobin: 9.9 g/dL — ABNORMAL LOW (ref 12.0–15.0)
MCH: 28.4 pg (ref 26.0–34.0)
MCHC: 34.6 g/dL (ref 30.0–36.0)
MCV: 82.2 fL (ref 80.0–100.0)
Platelets: 417 10*3/uL — ABNORMAL HIGH (ref 150–400)
RBC: 3.48 MIL/uL — ABNORMAL LOW (ref 3.87–5.11)
RDW: 12.6 % (ref 11.5–15.5)
WBC: 9.2 10*3/uL (ref 4.0–10.5)
nRBC: 0 % (ref 0.0–0.2)

## 2023-06-25 LAB — VITAMIN B12: Vitamin B-12: 795 pg/mL (ref 180–914)

## 2023-06-25 LAB — COMPREHENSIVE METABOLIC PANEL
ALT: 21 U/L (ref 0–44)
AST: 19 U/L (ref 15–41)
Albumin: 2.6 g/dL — ABNORMAL LOW (ref 3.5–5.0)
Alkaline Phosphatase: 89 U/L (ref 38–126)
Anion gap: 16 — ABNORMAL HIGH (ref 5–15)
BUN: 15 mg/dL (ref 8–23)
CO2: 18 mmol/L — ABNORMAL LOW (ref 22–32)
Calcium: 6.8 mg/dL — ABNORMAL LOW (ref 8.9–10.3)
Chloride: 81 mmol/L — ABNORMAL LOW (ref 98–111)
Creatinine, Ser: 0.66 mg/dL (ref 0.44–1.00)
GFR, Estimated: >60 mL/min (ref >60)
Glucose, Bld: 380 mg/dL — ABNORMAL HIGH (ref 70–99)
Potassium: 3.7 mmol/L (ref 3.5–5.1)
Sodium: 115 mmol/L — CL (ref 135–145)
Total Bilirubin: 0.9 mg/dL (ref 0.0–1.2)
Total Protein: 6.1 g/dL — ABNORMAL LOW (ref 6.5–8.1)

## 2023-06-25 LAB — RESPIRATORY PANEL BY PCR

## 2023-06-25 LAB — ECHOCARDIOGRAM COMPLETE
AR max vel: 2.34 cm2
AV Area VTI: 2.5 cm2
AV Area mean vel: 2.12 cm2
AV Mean grad: 4 mm[Hg]
AV Peak grad: 7.4 mm[Hg]
Ao pk vel: 1.36 m/s
Area-P 1/2: 7.82 cm2
Calc EF: 61.2 %
Height: 65 in
MV M vel: 5.76 m/s
MV Peak grad: 132.7 mm[Hg]
S' Lateral: 3.5 cm
Single Plane A2C EF: 66.8 %
Single Plane A4C EF: 52.5 %
Weight: 2088.2 [oz_av]

## 2023-06-25 LAB — CREATININE, URINE, RANDOM: Creatinine, Urine: 22 mg/dL

## 2023-06-25 LAB — SODIUM, URINE, RANDOM: Sodium, Ur: 18 mmol/L

## 2023-06-25 LAB — MRSA NEXT GEN BY PCR, NASAL: MRSA by PCR Next Gen: NOT DETECTED

## 2023-06-25 LAB — HEMOGLOBIN A1C
Hgb A1c MFr Bld: 9.5 % — ABNORMAL HIGH (ref 4.8–5.6)
Mean Plasma Glucose: 225.95 mg/dL

## 2023-06-25 LAB — IRON AND TIBC
Iron: 33 ug/dL (ref 28–170)
Saturation Ratios: 13 % (ref 10.4–31.8)
TIBC: 264 ug/dL (ref 250–450)
UIBC: 231 ug/dL

## 2023-06-25 LAB — BLOOD GAS, VENOUS
Acid-base deficit: 0.6 mmol/L (ref 0.0–2.0)
Bicarbonate: 24.9 mmol/L (ref 20.0–28.0)
Drawn by: 7012
O2 Saturation: 70.7 %
Patient temperature: 37.1
pCO2, Ven: 43 mm[Hg] — ABNORMAL LOW (ref 44–60)
pH, Ven: 7.37 (ref 7.25–7.43)
pO2, Ven: 36 mm[Hg] (ref 32–45)

## 2023-06-25 LAB — NA AND K (SODIUM & POTASSIUM), RAND UR
Potassium Urine: 38 mmol/L
Sodium, Ur: 25 mmol/L

## 2023-06-25 LAB — MAGNESIUM: Magnesium: 0.8 mg/dL — CL (ref 1.7–2.4)

## 2023-06-25 LAB — D-DIMER, QUANTITATIVE: D-Dimer, Quant: 2.64 ug{FEU}/mL — ABNORMAL HIGH (ref 0.00–0.50)

## 2023-06-25 LAB — FERRITIN: Ferritin: 55 ng/mL (ref 11–307)

## 2023-06-25 LAB — OSMOLALITY, URINE: Osmolality, Ur: 412 mosm/kg (ref 300–900)

## 2023-06-25 LAB — FOLATE: Folate: 11.9 ng/mL (ref >5.9)

## 2023-06-25 LAB — PHOSPHORUS: Phosphorus: 2.5 mg/dL (ref 2.5–4.6)

## 2023-06-25 LAB — OSMOLALITY: Osmolality: 259 mosm/kg — ABNORMAL LOW (ref 275–295)

## 2023-06-25 LAB — SODIUM
Sodium: 111 mmol/L — CL (ref 135–145)
Sodium: 115 mmol/L — CL (ref 135–145)

## 2023-06-25 LAB — TSH: TSH: 1.326 u[IU]/mL (ref 0.350–4.500)

## 2023-06-25 LAB — TROPONIN I (HIGH SENSITIVITY): Troponin I (High Sensitivity): 42 ng/L — ABNORMAL HIGH (ref <18)

## 2023-06-25 LAB — PROCALCITONIN: Procalcitonin: 0.14 ng/mL

## 2023-06-25 MED ORDER — AZITHROMYCIN 250 MG PO TABS
250.0000 mg | ORAL_TABLET | Freq: Every day | ORAL | Status: DC
  Administered 2023-06-26: 250 mg via ORAL
  Filled 2023-06-25: qty 1

## 2023-06-25 MED ORDER — ALPRAZOLAM 0.25 MG PO TABS
0.2500 mg | ORAL_TABLET | Freq: Two times a day (BID) | ORAL | Status: DC
  Administered 2023-06-25 – 2023-07-03 (×17): 0.25 mg via ORAL
  Filled 2023-06-25 (×17): qty 1

## 2023-06-25 MED ORDER — INSULIN ASPART 100 UNIT/ML IJ SOLN
0.0000 [IU] | Freq: Three times a day (TID) | INTRAMUSCULAR | Status: DC
  Administered 2023-06-25: 7 [IU] via SUBCUTANEOUS

## 2023-06-25 MED ORDER — IPRATROPIUM-ALBUTEROL 0.5-2.5 (3) MG/3ML IN SOLN
RESPIRATORY_TRACT | Status: AC
  Administered 2023-06-25: 3 mL via RESPIRATORY_TRACT
  Filled 2023-06-25: qty 3

## 2023-06-25 MED ORDER — ONDANSETRON HCL 4 MG PO TABS: 4.0000 mg | ORAL_TABLET | Freq: Four times a day (QID) | ORAL | Status: DC | PRN

## 2023-06-25 MED ORDER — DM-GUAIFENESIN ER 30-600 MG PO TB12
1.0000 | ORAL_TABLET | Freq: Two times a day (BID) | ORAL | Status: DC
  Administered 2023-06-25 – 2023-06-29 (×10): 1 via ORAL
  Filled 2023-06-25 (×10): qty 1

## 2023-06-25 MED ORDER — CHLORHEXIDINE GLUCONATE CLOTH 2 % EX PADS
6.0000 | MEDICATED_PAD | Freq: Every day | CUTANEOUS | Status: DC
  Administered 2023-06-25 – 2023-07-03 (×10): 6 via TOPICAL

## 2023-06-25 MED ORDER — LABETALOL HCL 5 MG/ML IV SOLN
5.0000 mg | Freq: Once | INTRAVENOUS | Status: AC
  Administered 2023-06-25: 5 mg via INTRAVENOUS
  Filled 2023-06-25: qty 4

## 2023-06-25 MED ORDER — INSULIN ASPART 100 UNIT/ML IJ SOLN: 0.0000 [IU] | Freq: Three times a day (TID) | INTRAMUSCULAR | Status: DC

## 2023-06-25 MED ORDER — GLUCERNA SHAKE PO LIQD
237.0000 mL | Freq: Three times a day (TID) | ORAL | Status: DC
  Administered 2023-06-25 – 2023-06-30 (×14): 237 mL via ORAL

## 2023-06-25 MED ORDER — AMLODIPINE BESYLATE 5 MG PO TABS
10.0000 mg | ORAL_TABLET | Freq: Every day | ORAL | Status: DC
  Administered 2023-06-25 – 2023-07-03 (×9): 10 mg via ORAL
  Filled 2023-06-25 (×9): qty 2

## 2023-06-25 MED ORDER — SODIUM CHLORIDE 0.9 % IV SOLN: INTRAVENOUS | Status: DC

## 2023-06-25 MED ORDER — ENOXAPARIN SODIUM 40 MG/0.4ML IJ SOSY
40.0000 mg | PREFILLED_SYRINGE | INTRAMUSCULAR | Status: DC
  Administered 2023-06-25 – 2023-06-27 (×3): 40 mg via SUBCUTANEOUS
  Filled 2023-06-25 (×3): qty 0.4

## 2023-06-25 MED ORDER — INSULIN ASPART 100 UNIT/ML IJ SOLN
0.0000 [IU] | Freq: Three times a day (TID) | INTRAMUSCULAR | Status: DC
  Administered 2023-06-25 – 2023-06-26 (×2): 4 [IU] via SUBCUTANEOUS
  Administered 2023-06-26: 15 [IU] via SUBCUTANEOUS
  Administered 2023-06-26: 4 [IU] via SUBCUTANEOUS
  Administered 2023-06-27: 7 [IU] via SUBCUTANEOUS
  Administered 2023-06-27: 11 [IU] via SUBCUTANEOUS
  Administered 2023-06-28: 7 [IU] via SUBCUTANEOUS
  Administered 2023-06-28 – 2023-06-29 (×2): 11 [IU] via SUBCUTANEOUS
  Administered 2023-06-29 – 2023-06-30 (×2): 15 [IU] via SUBCUTANEOUS
  Administered 2023-06-30: 4 [IU] via SUBCUTANEOUS
  Administered 2023-06-30: 20 [IU] via SUBCUTANEOUS
  Administered 2023-07-01: 4 [IU] via SUBCUTANEOUS
  Administered 2023-07-01: 7 [IU] via SUBCUTANEOUS
  Administered 2023-07-02: 4 [IU] via SUBCUTANEOUS
  Administered 2023-07-02: 11 [IU] via SUBCUTANEOUS
  Administered 2023-07-03: 7 [IU] via SUBCUTANEOUS

## 2023-06-25 MED ORDER — ACETAMINOPHEN 650 MG RE SUPP: 650.0000 mg | Freq: Four times a day (QID) | RECTAL | Status: DC | PRN

## 2023-06-25 MED ORDER — ORAL CARE MOUTH RINSE: 15.0000 mL | OROMUCOSAL | Status: DC | PRN

## 2023-06-25 MED ORDER — SODIUM BICARBONATE 650 MG PO TABS: 650.0000 mg | ORAL_TABLET | Freq: Every day | ORAL | Status: DC

## 2023-06-25 MED ORDER — ACETAMINOPHEN 325 MG PO TABS
650.0000 mg | ORAL_TABLET | Freq: Four times a day (QID) | ORAL | Status: DC | PRN
  Administered 2023-07-01 – 2023-07-02 (×2): 650 mg via ORAL
  Filled 2023-06-25 (×2): qty 2

## 2023-06-25 MED ORDER — PANTOPRAZOLE SODIUM 40 MG PO TBEC
40.0000 mg | DELAYED_RELEASE_TABLET | Freq: Every day | ORAL | Status: DC
  Administered 2023-06-25 – 2023-06-27 (×3): 40 mg via ORAL
  Filled 2023-06-25 (×3): qty 1

## 2023-06-25 MED ORDER — POTASSIUM CHLORIDE CRYS ER 20 MEQ PO TBCR
40.0000 meq | EXTENDED_RELEASE_TABLET | Freq: Every day | ORAL | Status: DC
  Administered 2023-06-25 – 2023-06-26 (×2): 40 meq via ORAL
  Filled 2023-06-25 (×2): qty 2

## 2023-06-25 MED ORDER — METOPROLOL TARTRATE 25 MG PO TABS
25.0000 mg | ORAL_TABLET | Freq: Two times a day (BID) | ORAL | Status: DC
  Administered 2023-06-25 – 2023-06-27 (×6): 25 mg via ORAL
  Filled 2023-06-25 (×7): qty 1

## 2023-06-25 MED ORDER — PERFLUTREN LIPID MICROSPHERE
1.0000 mL | INTRAVENOUS | Status: AC | PRN
  Administered 2023-06-25: 3 mL via INTRAVENOUS

## 2023-06-25 MED ORDER — INSULIN ASPART 100 UNIT/ML IJ SOLN: 0.0000 [IU] | Freq: Every day | INTRAMUSCULAR | Status: DC

## 2023-06-25 MED ORDER — INSULIN GLARGINE-YFGN 100 UNIT/ML ~~LOC~~ SOLN
12.0000 [IU] | Freq: Every day | SUBCUTANEOUS | Status: DC
Start: 2023-06-25 — End: 2023-06-26
  Administered 2023-06-25 – 2023-06-26 (×2): 12 [IU] via SUBCUTANEOUS
  Filled 2023-06-25 (×3): qty 0.12

## 2023-06-25 MED ORDER — IPRATROPIUM-ALBUTEROL 0.5-2.5 (3) MG/3ML IN SOLN
3.0000 mL | RESPIRATORY_TRACT | Status: DC
  Administered 2023-06-25 – 2023-06-27 (×12): 3 mL via RESPIRATORY_TRACT
  Filled 2023-06-25 (×12): qty 3

## 2023-06-25 MED ORDER — IPRATROPIUM BROMIDE 0.02 % IN SOLN: 0.5000 mg | RESPIRATORY_TRACT | Status: DC | PRN

## 2023-06-25 MED ORDER — DEXTROSE 50 % IV SOLN: 0.0000 mL | INTRAVENOUS | Status: DC | PRN

## 2023-06-25 MED ORDER — IOHEXOL 350 MG/ML SOLN
75.0000 mL | Freq: Once | INTRAVENOUS | Status: AC | PRN
  Administered 2023-06-25: 75 mL via INTRAVENOUS

## 2023-06-25 MED ORDER — INSULIN REGULAR(HUMAN) IN NACL 100-0.9 UT/100ML-% IV SOLN
INTRAVENOUS | Status: DC
  Administered 2023-06-25: 16 [IU]/h via INTRAVENOUS
  Filled 2023-06-25: qty 100

## 2023-06-25 MED ORDER — AZITHROMYCIN 250 MG PO TABS
500.0000 mg | ORAL_TABLET | Freq: Every day | ORAL | Status: AC
  Administered 2023-06-25: 500 mg via ORAL
  Filled 2023-06-25: qty 2

## 2023-06-25 MED ORDER — CALCIUM CARBONATE 1250 (500 CA) MG PO TABS
1.0000 | ORAL_TABLET | Freq: Every day | ORAL | Status: DC
  Administered 2023-06-25 – 2023-07-03 (×9): 1250 mg via ORAL
  Filled 2023-06-25 (×9): qty 1

## 2023-06-25 MED ORDER — MAGNESIUM SULFATE 4 GM/100ML IV SOLN
4.0000 g | Freq: Once | INTRAVENOUS | Status: AC
  Administered 2023-06-25: 4 g via INTRAVENOUS
  Filled 2023-06-25: qty 100

## 2023-06-25 MED ORDER — BUDESONIDE 0.5 MG/2ML IN SUSP
0.5000 mg | Freq: Two times a day (BID) | RESPIRATORY_TRACT | Status: AC
Start: 2023-06-25 — End: ?
  Administered 2023-06-25 – 2023-07-03 (×17): 0.5 mg via RESPIRATORY_TRACT
  Filled 2023-06-25 (×18): qty 2

## 2023-06-25 MED ORDER — ARFORMOTEROL TARTRATE 15 MCG/2ML IN NEBU
15.0000 ug | INHALATION_SOLUTION | Freq: Two times a day (BID) | RESPIRATORY_TRACT | Status: DC
  Administered 2023-06-25 – 2023-07-03 (×17): 15 ug via RESPIRATORY_TRACT
  Filled 2023-06-25 (×18): qty 2

## 2023-06-25 MED ORDER — SODIUM BICARBONATE 650 MG PO TABS: 650.0000 mg | ORAL_TABLET | Freq: Two times a day (BID) | ORAL | Status: DC

## 2023-06-25 MED ORDER — ONDANSETRON HCL 4 MG/2ML IJ SOLN: 4.0000 mg | Freq: Four times a day (QID) | INTRAMUSCULAR | Status: DC | PRN

## 2023-06-25 MED ORDER — IPRATROPIUM BROMIDE 0.02 % IN SOLN: 0.5000 mg | Freq: Four times a day (QID) | RESPIRATORY_TRACT | Status: DC

## 2023-06-25 MED ORDER — ROSUVASTATIN CALCIUM 20 MG PO TABS
20.0000 mg | ORAL_TABLET | Freq: Every day | ORAL | Status: DC
  Administered 2023-06-25 – 2023-06-29 (×5): 20 mg via ORAL
  Filled 2023-06-25 (×5): qty 1

## 2023-06-25 MED ORDER — INSULIN ASPART 100 UNIT/ML IJ SOLN
0.0000 [IU] | Freq: Every day | INTRAMUSCULAR | Status: DC
  Administered 2023-06-25: 5 [IU] via SUBCUTANEOUS
  Administered 2023-06-26 – 2023-07-01 (×3): 2 [IU] via SUBCUTANEOUS

## 2023-06-25 MED ORDER — METHYLPREDNISOLONE SODIUM SUCC 40 MG IJ SOLR
40.0000 mg | Freq: Two times a day (BID) | INTRAMUSCULAR | Status: DC
  Administered 2023-06-25 – 2023-06-27 (×6): 40 mg via INTRAVENOUS
  Filled 2023-06-25 (×6): qty 1

## 2023-06-25 MED ORDER — FUROSEMIDE 10 MG/ML IJ SOLN
40.0000 mg | Freq: Every day | INTRAMUSCULAR | Status: DC
  Administered 2023-06-25 – 2023-06-29 (×5): 40 mg via INTRAVENOUS
  Filled 2023-06-25 (×5): qty 4

## 2023-06-25 NOTE — Progress Notes (Signed)
   06/25/23 1356  TOC Brief Assessment  Insurance and Status Reviewed  Patient has primary care physician Yes  Home environment has been reviewed From Home  Prior level of function: Independent,family support  Prior/Current Home Services No current home services  Social Drivers of Health Review SDOH reviewed no interventions necessary  Readmission risk has been reviewed Yes  Transition of care needs no transition of care needs at this time       Transition of Care Department [facility]) has reviewed patient and no TOC needs have been identified at this time. We will continue to monitor patient advancement through interdisciplinary progression rounds. If new patient transition needs arise, please place a TOC consult.

## 2023-06-25 NOTE — ED Notes (Signed)
ED TO INPATIENT HANDOFF REPORT  ED Nurse Name and Phone #: Karoline Caldwell [phone]  S Name/Age/Gender Sara Stafford 78 y.o. female Room/Bed: APA14/APA14  Code Status   Code Status: Prior  Home/SNF/Other Home Patient oriented to: self, place, time, and situation Is this baseline? Yes   Triage Complete: Triage complete  Chief Complaint Hyponatremia [E87.1]  Triage Note Pt to ED from home via [facility] cc shortness of breath for 1 week, worse tonight. Was seen by pcp earlier this week, diagnosed with congestion and given antibiotics per pt. Did not finish taking them because they made her "feel funny". Hx of COPD and htn. 85% on room air when EMS arrived to home, placed on 3l and spo2 99% on arrival to ED. Does not require home o2. EMS admin 2 duonebs and 125 solumedrol. Endorses anxiety due to her husband passing away this week.    Allergies Allergies  Allergen Reactions   Empagliflozin Nausea And Vomiting and Nausea Only   Ozempic (0.25 Or 0.5 Mg-Dose) [Semaglutide(0.25 Or 0.5mg -Dos)] Nausea And Vomiting   Semaglutide Nausea Only   Codeine Nausea And Vomiting   Doxycycline Other (See Comments)    REACTION: thrush  thrush    Level of Care/Admitting Diagnosis ED Disposition     ED Disposition  Admit   Condition  --   Comment  Hospital Area: [facility] [[id number]]  Level of Care: Stepdown [14]  Covid Evaluation: Asymptomatic - no recent exposure (last 10 days) testing not required  Diagnosis: Hyponatremia [198519]  Admitting Physician: Frankey Shown [[id number]]  Attending Physician: Frankey Shown [[id number]]  Certification:: I certify this patient will need inpatient services for at least 2 midnights  Expected Medical Readiness: 06/27/2023          B Medical/Surgery History Past Medical History:  Diagnosis Date   Arthritis    COPD (chronic obstructive pulmonary disease) (HCC)    no change with spiriva, poor tolerance of advair   Depressive disorder,  not elsewhere classified    Diabetes mellitus    Fatigue    Fibromyalgia    GERD (gastroesophageal reflux disease)    Hyperlipidemia    Hypertension    Macular degeneration    Routine general medical examination at a health care facility    Past Surgical History:  Procedure Laterality Date   ABDOMINAL HYSTERECTOMY     partial   CATARACT EXTRACTION Left 02/09/2015   CATARACT EXTRACTION Right 01/26/2015   CHOLECYSTECTOMY N/A 02/16/2022   Procedure: LAPAROSCOPIC CHOLECYSTECTOMY WITH INTRAOPERATIVE CHOLANGIOGRAM;  Surgeon: Earline Mayotte, MD;  Location: [facility] ORS;  Service: General;  Laterality: N/ASonda Rumble, RNFA to assist   COLONOSCOPY WITH PROPOFOL N/A 04/28/2021   Procedure: COLONOSCOPY WITH PROPOFOL;  Surgeon: Earline Mayotte, MD;  Location: [facility] ENDOSCOPY;  Service: Endoscopy;  Laterality: N/A;   ESOPHAGOGASTRODUODENOSCOPY (EGD) WITH PROPOFOL N/A 04/28/2021   Procedure: ESOPHAGOGASTRODUODENOSCOPY (EGD) WITH PROPOFOL;  Surgeon: Earline Mayotte, MD;  Location: [facility] ENDOSCOPY;  Service: Endoscopy;  Laterality: N/A;     A IV Location/Drains/Wounds Patient Lines/Drains/Airways Status     Active Line/Drains/Airways     Name Placement date Placement time Site Days   Peripheral IV 06/24/23 20 G Left Antecubital 06/24/23  2218  Antecubital  1            Intake/Output Last 24 hours No intake or output data in the 24 hours ending 06/25/23 0039  Labs/Imaging Results for orders placed or performed during the hospital encounter of 06/24/23 (from the past 48  hours)  Comprehensive metabolic panel     Status: Abnormal   Collection Time: 06/24/23 10:23 PM  Result Value Ref Range   Sodium 115 (LL) 135 - 145 mmol/L    Comment: CRITICAL RESULT CALLED TO, READ BACK BY AND VERIFIED WITH SUPER,B ON 06/24/23 AT 2309 BY PURDIE,J   Potassium 3.6 3.5 - 5.1 mmol/L   Chloride 80 (L) 98 - 111 mmol/L   CO2 22 22 - 32 mmol/L   Glucose, Bld 229 (H) 70 - 99 mg/dL    Comment: Glucose  reference range applies only to samples taken after fasting for at least 8 hours.   BUN 12 8 - 23 mg/dL   Creatinine, Ser 1.61 0.44 - 1.00 mg/dL   Calcium 6.5 (L) 8.9 - 10.3 mg/dL   Total Protein 6.1 (L) 6.5 - 8.1 g/dL   Albumin 2.7 (L) 3.5 - 5.0 g/dL   AST 21 15 - 41 U/L   ALT 21 0 - 44 U/L   Alkaline Phosphatase 96 38 - 126 U/L   Total Bilirubin 0.4 0.0 - 1.2 mg/dL   GFR, Estimated >09 >60 mL/min    Comment: (NOTE) Calculated using the CKD-EPI Creatinine Equation (2021)    Anion gap 13 5 - 15    Comment: Performed at [facility], [address], [place], [place] [address]  Troponin I (High Sensitivity)     Status: Abnormal   Collection Time: 06/24/23 10:23 PM  Result Value Ref Range   Troponin I (High Sensitivity) 50 (H) <18 ng/L    Comment: (NOTE) Elevated high sensitivity troponin I (hsTnI) values and significant  changes across serial measurements may suggest ACS but many other  chronic and acute conditions are known to elevate hsTnI results.  Refer to the "Links" section for chest pain algorithms and additional  guidance. Performed at [facility], [address], [place], [place] [address]   Brain natriuretic peptide     Status: Abnormal   Collection Time: 06/24/23 10:23 PM  Result Value Ref Range   B Natriuretic Peptide 567.0 (H) 0.0 - 100.0 pg/mL    Comment: Performed at [facility], [address] [place], [place] [address]  CBC with Differential     Status: Abnormal   Collection Time: 06/24/23 10:23 PM  Result Value Ref Range   WBC 12.2 (H) 4.0 - 10.5 K/uL   RBC 3.51 (L) 3.87 - 5.11 MIL/uL   Hemoglobin 10.3 (L) 12.0 - 15.0 g/dL   HCT 56.2 (L) 13.0 - 86.5 %   MCV 81.8 80.0 - 100.0 fL   MCH 29.3 26.0 - 34.0 pg   MCHC 35.9 30.0 - 36.0 g/dL   RDW 78.4 69.6 - 29.5 %   Platelets 467 (H) 150 - 400 K/uL   nRBC 0.0 0.0 - 0.2 %   Neutrophils Relative % 87 %   Neutro Abs 10.6 (H) 1.7 - 7.7 K/uL   Lymphocytes Relative 4 %   Lymphs Abs 0.5 (L) 0.7 - 4.0 K/uL    Monocytes Relative 5 %   Monocytes Absolute 0.7 0.1 - 1.0 K/uL   Eosinophils Relative 1 %   Eosinophils Absolute 0.1 0.0 - 0.5 K/uL   Basophils Relative 0 %   Basophils Absolute 0.0 0.0 - 0.1 K/uL   Immature Granulocytes 3 %   Abs Immature Granulocytes 0.31 (H) 0.00 - 0.07 K/uL    Comment: Performed at [facility], [address] [place], [place] [address]  Resp panel by RT-PCR (RSV, Flu A&B, Covid)  Anterior Nasal Swab     Status: None   Collection Time: 06/24/23 10:24 PM   Specimen: Anterior Nasal Swab  Result Value Ref Range   SARS Coronavirus 2 by RT PCR NEGATIVE NEGATIVE    Comment: (NOTE) SARS-CoV-2 target nucleic acids are NOT DETECTED.  The SARS-CoV-2 RNA is generally detectable in upper respiratory specimens during the acute phase of infection. The lowest concentration of SARS-CoV-2 viral copies this assay can detect is 138 copies/mL. A negative result does not preclude SARS-Cov-2 infection and should not be used as the sole basis for treatment or other patient management decisions. A negative result may occur with  improper specimen collection/handling, submission of specimen other than nasopharyngeal swab, presence of viral mutation(s) within the areas targeted by this assay, and inadequate number of viral copies(<138 copies/mL). A negative result must be combined with clinical observations, patient history, and epidemiological information. The expected result is Negative.  Fact Sheet for Patients:  BloggerCourse.com  Fact Sheet for Healthcare Providers:  SeriousBroker.it  This test is no t yet approved or cleared by the [place] FDA and  has been authorized for detection and/or diagnosis of SARS-CoV-2 by FDA under an Emergency Use Authorization (EUA). This EUA will remain  in effect (meaning this test can be used) for the duration of the COVID-19 declaration under Section 564(b)(1) of the Act, 21 U.S.C.section  360bbb-3(b)(1), unless the authorization is terminated  or revoked sooner.       Influenza A by PCR NEGATIVE NEGATIVE   Influenza B by PCR NEGATIVE NEGATIVE    Comment: (NOTE) The Xpert Xpress SARS-CoV-2/FLU/RSV plus assay is intended as an aid in the diagnosis of influenza from Nasopharyngeal swab specimens and should not be used as a sole basis for treatment. Nasal washings and aspirates are unacceptable for Xpert Xpress SARS-CoV-2/FLU/RSV testing.  Fact Sheet for Patients: BloggerCourse.com  Fact Sheet for Healthcare Providers: SeriousBroker.it  This test is not yet approved or cleared by the [place] FDA and has been authorized for detection and/or diagnosis of SARS-CoV-2 by FDA under an Emergency Use Authorization (EUA). This EUA will remain in effect (meaning this test can be used) for the duration of the COVID-19 declaration under Section 564(b)(1) of the Act, 21 U.S.C. section 360bbb-3(b)(1), unless the authorization is terminated or revoked.     Resp Syncytial Virus by PCR NEGATIVE NEGATIVE    Comment: (NOTE) Fact Sheet for Patients: BloggerCourse.com  Fact Sheet for Healthcare Providers: SeriousBroker.it  This test is not yet approved or cleared by the [place] FDA and has been authorized for detection and/or diagnosis of SARS-CoV-2 by FDA under an Emergency Use Authorization (EUA). This EUA will remain in effect (meaning this test can be used) for the duration of the COVID-19 declaration under Section 564(b)(1) of the Act, 21 U.S.C. section 360bbb-3(b)(1), unless the authorization is terminated or revoked.  Performed at [facility], [address], [place], [place] [address]    DG Chest 2 View Result Date: 06/24/2023 CLINICAL DATA:  Cough and dyspnea EXAM: CHEST - 2 VIEW COMPARISON:  None Available. FINDINGS: There are increased central interstitial  markings bilaterally. There is no lung consolidation, pleural effusion or pneumothorax. The cardiomediastinal silhouette is within normal limits. No acute fractures are seen. IMPRESSION: Increased central interstitial markings bilaterally may reflect atypical/viral infection. No lung consolidation. Electronically Signed   By: Darliss Cheney M.D.   On: 06/24/2023 22:58    Pending Labs [employer] (From admission, onward)     Start  Ordered   06/25/23 0034  MRSA Next Gen by PCR, Nasal  Once,   R        06/25/23 0034   06/24/23 2319  Na and K (sodium & potassium), rand urine  Once,   URGENT        06/24/23 2318   06/24/23 2319  Osmolality, urine  Once,   URGENT        06/24/23 2318   06/24/23 2319  TSH  Once,   URGENT        06/24/23 2318   06/24/23 2311  Osmolality  Once,   URGENT        06/24/23 2310            Vitals/Pain Today's Vitals   06/24/23 2300 06/25/23 0006 06/25/23 0010 06/25/23 0030  BP: (!) 177/74     Pulse: 89  (!) 110 (!) 114  Resp: (!) 27  (!) 28 (!) 29  Temp:      TempSrc:      SpO2: 100%  97% 95%  Weight:      Height:      PainSc:  0-No pain      Isolation Precautions No active isolations  Medications Medications  Chlorhexidine Gluconate Cloth 2 % PADS 6 each (has no administration in time range)  ipratropium-albuterol (DUONEB) 0.5-2.5 (3) MG/3ML nebulizer solution 3 mL (3 mLs Nebulization Given 06/24/23 2257)  albuterol (PROVENTIL) (2.5 MG/3ML) 0.083% nebulizer solution 2.5 mg (2.5 mg Nebulization Given 06/24/23 2257)  0.9 %  sodium chloride infusion ( Intravenous New Bag/Given 06/25/23 0035)    Mobility walks with device     Focused Assessments Pulmonary Assessment Handoff:  Lung sounds: Bilateral Breath Sounds: Diminished, Expiratory wheezes, Clear O2 Device: Nasal Cannula O2 Flow Rate (L/min): 2 L/min    R Recommendations: See Admitting Provider Note  Report given to:   Additional Notes:

## 2023-06-25 NOTE — Progress Notes (Signed)
   06/25/23 0828  ReDS Vest / Clip  Station Marker B  Ruler Value 39  ReDS Value Range < 36  ReDS Actual Value 19

## 2023-06-25 NOTE — Plan of Care (Signed)

## 2023-06-25 NOTE — Progress Notes (Signed)
Date and time results received: 06/25/23 1018 (use smartphrase ".now" to insert current time)  Test: Sodium  Critical Value: 111  Name of Provider Notified: Dr Tat  Orders Received? Or Actions Taken?:  Awaiting new orders

## 2023-06-25 NOTE — Progress Notes (Addendum)
PROGRESS NOTE  Sara Stafford UJW:[id number] DOB: [birth year] DOA: 06/24/2023 PCP: Danella Penton, MD  Brief History:  78 year old female with a history of diabetes mellitus type 2, hypertension, hyperlipidemia, anxiety/depression, COPD, tobacco abuse in remission presenting with 3-week history of shortness of breath, wheezing, coughing, and chest congestion.  The patient went to see her PCP on 06/20/2023.  The patient received IM injection of Kenalog.  She was given a prescription for azithromycin and cefdinir.  She finished azithromycin.  She quit taking the cefdinir after 3 doses because she felt like it was making her symptoms worse.  She denied any fevers, chills, headache, chest pain, abdominal pain.  She has some nausea and posttussive emesis.  She had some loose stools without any hematochezia or melena.  She denies any abdominal pain, dysuria, hematuria.  She denies any hemoptysis. Unfortunately, her shortness of breath continue to worsen.  She also noted some lower extremity edema.  As result, she presented for further evaluation and treatment.  She was noted to have oxygen saturation 95% on room air by EMS.  Notably, the patient states that she has been taking sodium chloride twice daily for the past year because of low sodium.  In addition, she states that she has not taken Effexor for over 1 year.  She has only taken 3 doses of Paxil. She had a hospital admission from 07/10/2022 to 07/11/2022 secondary to hyponatremia.  She was started on sodium chloride during this hospitalization.  This was continued until the time of this admission. The patient also was hospitalized from 04/25/2022 to 04/28/2022 for hyponatremia.  She improved with normal saline and stopping her Effexor.  In the ED, the patient was afebrile hemodynamically stable with oxygen saturation 98% on 2 L. WBC 12.2, hemoglobin 10.3, platelets 467.  Sodium 115, potassium 3.6, bicarbonate 22, serum creatinine 0.50.   LFTs were unremarkable.  Chest x-ray showed increased interstitial markings.  The patient was started on normal saline, bronchodilators, and Solu-Medrol.  COVID-19 PCR is negative.  TSH 1.326.  BNP 567.  Troponin 50>> 42.      Assessment/Plan: Acute respiratory failure with hypoxia -Multifactorial including COPD exacerbation and fluid overload -Stable on 2 L nasal cannula -Wean oxygen as tolerated for saturation greater 92% -Obtain VBG  COPD exacerbation -COVID-19 PCR negative -Check viral respiratory panel -MRSA screen negative -Check PCT -Continue DuoNebs -Start Brovana -Start Pulmicort -Patient has approx 30-pack-year history of tobacco;, quit 20 years ago  Fluid overload -Echocardiogram -Start IV furosemide -Daily weights -Accurate I's and O's -obtain ReDS  Increased troponin -Secondary to demand ischemia -Chest pain presently -Personally reviewed EKG--sinus rhythm, nonspecific T wave change -Echocardiogram  Hyponatremia -She appears clinically fluid overloaded -Check sodium every 4 hours -Give Lasix IV and reassess -Urine osmolarity -Serum osmolarity -Urine sodium 25 -06/25/23 Na = 115, but serum glucose 380  Anxiety/depression -Continue home dose alprazolam -PDMP reviewed--alprazolam, 0.25 mg, #120, last refill 05/30/2023  Uncontrolled diabetes mellitus type 2 with hyperglycemia -05/05/2023 hemoglobin A1c 8.9 -Use resistant sliding scale -Holding metformin and glipizide  Essential hypertension -Continue amlodipine and metoprolol  Mixed hyperlipidemia -Continue statin  Leg pain/edema -venous duplex  Hypomagnesemia -replete        Family Communication:   no Family at bedside  Consultants:  none  Code Status:  FULL   DVT Prophylaxis:  Leith Lovenox   Procedures: As Listed in Progress Note Above  Antibiotics: None    The patient is critically  ill with multiple organ systems failure and requires high complexity decision making for  assessment and support, frequent evaluation and titration of therapies, application of advanced monitoring technologies and extensive interpretation of multiple databases.  Critical care time - 40 mins.     Subjective: Patient continues to feel short of breath with largely nonproductive cough.  She has nausea without any emesis.  She denies any abdominal pain, chest pain, headache.  There is no hematochezia or melena.  She has some loose stools but none in the last 24 hours.  Objective: Vitals:   06/25/23 0539 06/25/23 0600 06/25/23 0700 06/25/23 0728  BP: (!) 162/77 (!) 165/83 (!) 185/90   Pulse: (!) 109 96 (!) 107   Resp: (!) 26 (!) 28 (!) 31   Temp: 98.4 F (36.9 C)   97.8 F (36.6 C)  TempSrc: Oral   Axillary  SpO2: 98% 96% 97%   Weight:      Height:        Intake/Output Summary (Last 24 hours) at 06/25/2023 0810 Last data filed at 06/25/2023 0506 Gross per 24 hour  Intake 293.85 ml  Output --  Net 293.85 ml   Weight change:  Exam:  General:  Pt is alert, follows commands appropriately, not in acute distress HEENT: No icterus, No thrush, No neck mass, [place]/AT Cardiovascular: RRR, S1/S2, no rubs, no gallops Respiratory: CTA bilaterally, no wheezing, no crackles, no rhonchi Abdomen: Soft/+BS, non tender, non distended, no guarding Extremities: No edema, No lymphangitis, No petechiae, No rashes, no synovitis   Data Reviewed: I have personally reviewed following labs and imaging studies Basic Metabolic Panel: Recent Labs  Lab 06/24/23 2223 06/25/23 0548  NA 115* 115*  K 3.6 3.7  CL 80* 81*  CO2 22 18*  GLUCOSE 229* 380*  BUN 12 15  CREATININE 0.50 0.66  CALCIUM 6.5* 6.8*  MG  --  0.8*  PHOS  --  2.5   Liver Function Tests: Recent Labs  Lab 06/24/23 2223 06/25/23 0548  AST 21 19  ALT 21 21  ALKPHOS 96 89  BILITOT 0.4 0.9  PROT 6.1* 6.1*  ALBUMIN 2.7* 2.6*   No results for input(s): "LIPASE", "AMYLASE" in the last 168 hours. No results for input(s):  "AMMONIA" in the last 168 hours. Coagulation Profile: No results for input(s): "INR", "PROTIME" in the last 168 hours. CBC: Recent Labs  Lab 06/24/23 2223 06/25/23 0548  WBC 12.2* 9.2  NEUTROABS 10.6*  --   HGB 10.3* 9.9*  HCT 28.7* 28.6*  MCV 81.8 82.2  PLT 467* 417*   Cardiac Enzymes: No results for input(s): "CKTOTAL", "CKMB", "CKMBINDEX", "TROPONINI" in the last 168 hours. BNP: Invalid input(s): "POCBNP" CBG: Recent Labs  Lab 06/25/23 0104 06/25/23 0258 06/25/23 0717  GLUCAP 411* 337* 322*   HbA1C: No results for input(s): "HGBA1C" in the last 72 hours. Urine analysis:    Component Value Date/Time   COLORURINE YELLOW (A) 04/25/2022 1857   APPEARANCEUR HAZY (A) 04/25/2022 1857   APPEARANCEUR Hazy (A) 01/26/2021 1309   LABSPEC 1.011 04/25/2022 1857   PHURINE 5.0 04/25/2022 1857   GLUCOSEU NEGATIVE 04/25/2022 1857   GLUCOSEU NEGATIVE 02/29/2012 1116   HGBUR NEGATIVE 04/25/2022 1857   HGBUR large 01/01/2010 1204   BILIRUBINUR NEGATIVE 04/25/2022 1857   BILIRUBINUR Negative 01/26/2021 1309   KETONESUR NEGATIVE 04/25/2022 1857   PROTEINUR NEGATIVE 04/25/2022 1857   UROBILINOGEN 0.2 02/29/2012 1116   NITRITE NEGATIVE 04/25/2022 1857   LEUKOCYTESUR NEGATIVE 04/25/2022 1857   Sepsis  Labs: @LABRCNTIP (procalcitonin:4,lacticidven:4) ) Recent Results (from the past 240 hours)  Resp panel by RT-PCR (RSV, Flu A&B, Covid) Anterior Nasal Swab     Status: None   Collection Time: 06/24/23 10:24 PM   Specimen: Anterior Nasal Swab  Result Value Ref Range Status   SARS Coronavirus 2 by RT PCR NEGATIVE NEGATIVE Final    Comment: (NOTE) SARS-CoV-2 target nucleic acids are NOT DETECTED.  The SARS-CoV-2 RNA is generally detectable in upper respiratory specimens during the acute phase of infection. The lowest concentration of SARS-CoV-2 viral copies this assay can detect is 138 copies/mL. A negative result does not preclude SARS-Cov-2 infection and should not be used as the  sole basis for treatment or other patient management decisions. A negative result may occur with  improper specimen collection/handling, submission of specimen other than nasopharyngeal swab, presence of viral mutation(s) within the areas targeted by this assay, and inadequate number of viral copies(<138 copies/mL). A negative result must be combined with clinical observations, patient history, and epidemiological information. The expected result is Negative.  Fact Sheet for Patients:  BloggerCourse.com  Fact Sheet for Healthcare Providers:  SeriousBroker.it  This test is no t yet approved or cleared by the [place] FDA and  has been authorized for detection and/or diagnosis of SARS-CoV-2 by FDA under an Emergency Use Authorization (EUA). This EUA will remain  in effect (meaning this test can be used) for the duration of the COVID-19 declaration under Section 564(b)(1) of the Act, 21 U.S.C.section 360bbb-3(b)(1), unless the authorization is terminated  or revoked sooner.       Influenza A by PCR NEGATIVE NEGATIVE Final   Influenza B by PCR NEGATIVE NEGATIVE Final    Comment: (NOTE) The Xpert Xpress SARS-CoV-2/FLU/RSV plus assay is intended as an aid in the diagnosis of influenza from Nasopharyngeal swab specimens and should not be used as a sole basis for treatment. Nasal washings and aspirates are unacceptable for Xpert Xpress SARS-CoV-2/FLU/RSV testing.  Fact Sheet for Patients: BloggerCourse.com  Fact Sheet for Healthcare Providers: SeriousBroker.it  This test is not yet approved or cleared by the [place] FDA and has been authorized for detection and/or diagnosis of SARS-CoV-2 by FDA under an Emergency Use Authorization (EUA). This EUA will remain in effect (meaning this test can be used) for the duration of the COVID-19 declaration under Section 564(b)(1) of the  Act, 21 U.S.C. section 360bbb-3(b)(1), unless the authorization is terminated or revoked.     Resp Syncytial Virus by PCR NEGATIVE NEGATIVE Final    Comment: (NOTE) Fact Sheet for Patients: BloggerCourse.com  Fact Sheet for Healthcare Providers: SeriousBroker.it  This test is not yet approved or cleared by the [place] FDA and has been authorized for detection and/or diagnosis of SARS-CoV-2 by FDA under an Emergency Use Authorization (EUA). This EUA will remain in effect (meaning this test can be used) for the duration of the COVID-19 declaration under Section 564(b)(1) of the Act, 21 U.S.C. section 360bbb-3(b)(1), unless the authorization is terminated or revoked.  Performed at [facility], [address], [place], [place] [address]   MRSA Next Gen by PCR, Nasal     Status: None   Collection Time: 06/25/23 12:57 AM   Specimen: Nasal Mucosa; Nasal Swab  Result Value Ref Range Status   MRSA by PCR Next Gen NOT DETECTED NOT DETECTED Final    Comment: (NOTE) The GeneXpert MRSA Assay (FDA approved for NASAL specimens only), is one component of a comprehensive MRSA colonization surveillance program. It is  not intended to diagnose MRSA infection nor to guide or monitor treatment for MRSA infections. Test performance is not FDA approved in patients less than 2 years old. Performed at [facility], [address], [place], [place] [address]      Scheduled Meds:  ALPRAZolam  0.25 mg Oral BID   amLODipine  10 mg Oral Daily   arformoterol  15 mcg Nebulization BID   azithromycin  500 mg Oral Daily   Followed by   Melene Muller ON 06/26/2023] azithromycin  250 mg Oral Daily   budesonide (PULMICORT) nebulizer solution  0.5 mg Nebulization BID   calcium carbonate  1 tablet Oral Q breakfast   Chlorhexidine Gluconate Cloth  6 each Topical Daily   dextromethorphan-guaiFENesin  1 tablet Oral BID   enoxaparin (LOVENOX) injection  40 mg  Subcutaneous Q24H   feeding supplement (GLUCERNA SHAKE)  237 mL Oral TID BM   furosemide  40 mg Intravenous Daily   insulin aspart  0-9 Units Subcutaneous TID WC   ipratropium-albuterol  3 mL Nebulization Q4H   methylPREDNISolone (SOLU-MEDROL) injection  40 mg Intravenous Q12H   metoprolol tartrate  25 mg Oral BID   pantoprazole  40 mg Oral Daily   potassium chloride  40 mEq Oral Daily   rosuvastatin  20 mg Oral QHS   Continuous Infusions:  magnesium sulfate bolus IVPB 4 g (06/25/23 0746)    Procedures/Studies: DG Chest 2 View Result Date: 06/24/2023 CLINICAL DATA:  Cough and dyspnea EXAM: CHEST - 2 VIEW COMPARISON:  None Available. FINDINGS: There are increased central interstitial markings bilaterally. There is no lung consolidation, pleural effusion or pneumothorax. The cardiomediastinal silhouette is within normal limits. No acute fractures are seen. IMPRESSION: Increased central interstitial markings bilaterally may reflect atypical/viral infection. No lung consolidation. Electronically Signed   By: Darliss Cheney M.D.   On: 06/24/2023 22:58    Catarina Hartshorn, DO  [facility]  If 7PM-7AM, please contact night-coverage [email or web address] Password [email or web address] 06/25/2023, 8:10 AM   LOS: 1 day

## 2023-06-25 NOTE — Hospital Course (Signed)
78 year old female with a history of diabetes mellitus type 2, hypertension, hyperlipidemia, anxiety/depression, COPD, tobacco abuse in remission presenting with 3-week history of shortness of breath, wheezing, coughing, and chest congestion.  The patient went to see her PCP on 06/20/2023.  The patient received IM injection of Kenalog.  She was given a prescription for azithromycin and cefdinir.  She finished azithromycin.  She quit taking the cefdinir after 3 doses because she felt like it was making her symptoms worse.  She denied any fevers, chills, headache, chest pain, abdominal pain.  She has some nausea and posttussive emesis.  She had some loose stools without any hematochezia or melena.  She denies any abdominal pain, dysuria, hematuria.  She denies any hemoptysis. Unfortunately, her shortness of breath continue to worsen.  She also noted some lower extremity edema.  As result, she presented for further evaluation and treatment.  She was noted to have oxygen saturation 95% on room air by EMS.  Notably, the patient states that she has been taking sodium chloride twice daily for the past year because of low sodium.  In addition, she states that she has not taken Effexor for over 1 year.  She has only taken 3 doses of Paxil. She had a hospital admission from 07/10/2022 to 07/11/2022 secondary to hyponatremia.  She was started on sodium chloride during this hospitalization.  This was continued until the time of this admission. The patient also was hospitalized from 04/25/2022 to 04/28/2022 for hyponatremia.  She improved with normal saline and stopping her Effexor.  In the ED, the patient was afebrile hemodynamically stable with oxygen saturation 98% on 2 L. WBC 12.2, hemoglobin 10.3, platelets 467.  Sodium 115, potassium 3.6, bicarbonate 22, serum creatinine 0.50.  LFTs were unremarkable.  Chest x-ray showed increased interstitial markings.  The patient was started on normal saline, bronchodilators, and  Solu-Medrol.  COVID-19 PCR is negative.  TSH 1.326.  BNP 567.  Troponin 50>> 42.

## 2023-06-25 NOTE — Plan of Care (Signed)
Problem: Education: Goal: Knowledge of General Education information will improve Description: Including pain rating scale, medication(s)/side effects and non-pharmacologic comfort measures Outcome: Progressing   Problem: Health Behavior/Discharge Planning: Goal: Ability to manage health-related needs will improve Outcome: Progressing   Problem: Clinical Measurements: Goal: Ability to maintain clinical measurements within normal limits will improve Outcome: Progressing Goal: Will remain free from infection Outcome: Progressing Goal: Diagnostic test results will improve Outcome: Progressing Goal: Respiratory complications will improve Outcome: Progressing Goal: Cardiovascular complication will be avoided Outcome: Progressing   Problem: Activity: Goal: Risk for activity intolerance will decrease Outcome: Progressing   Problem: Nutrition: Goal: Adequate nutrition will be maintained Outcome: Progressing   Problem: Coping: Goal: Level of anxiety will decrease Outcome: Progressing   Problem: Elimination: Goal: Will not experience complications related to bowel motility Outcome: Progressing Goal: Will not experience complications related to urinary retention Outcome: Progressing   Problem: Pain Managment: Goal: General experience of comfort will improve and/or be controlled Outcome: Progressing   Problem: Safety: Goal: Ability to remain free from injury will improve Outcome: Progressing   Problem: Skin Integrity: Goal: Risk for impaired skin integrity will decrease Outcome: Progressing   Problem: Education: Goal: Ability to describe self-care measures that may prevent or decrease complications (Diabetes Survival Skills Education) will improve Outcome: Progressing Goal: Individualized Educational Video(s) Outcome: Progressing   Problem: Coping: Goal: Ability to adjust to condition or change in health will improve Outcome: Progressing   Problem: Fluid  Volume: Goal: Ability to maintain a balanced intake and output will improve Outcome: Progressing   Problem: Health Behavior/Discharge Planning: Goal: Ability to identify and utilize available resources and services will improve Outcome: Progressing Goal: Ability to manage health-related needs will improve Outcome: Progressing   Problem: Metabolic: Goal: Ability to maintain appropriate glucose levels will improve Outcome: Progressing   Problem: Nutritional: Goal: Maintenance of adequate nutrition will improve Outcome: Progressing Goal: Progress toward achieving an optimal weight will improve Outcome: Progressing   Problem: Skin Integrity: Goal: Risk for impaired skin integrity will decrease Outcome: Progressing   Problem: Tissue Perfusion: Goal: Adequacy of tissue perfusion will improve Outcome: Progressing   Problem: Education: Goal: Ability to describe self-care measures that may prevent or decrease complications (Diabetes Survival Skills Education) will improve Outcome: Progressing Goal: Individualized Educational Video(s) Outcome: Progressing   Problem: Coping: Goal: Ability to adjust to condition or change in health will improve Outcome: Progressing   Problem: Fluid Volume: Goal: Ability to maintain a balanced intake and output will improve Outcome: Progressing   Problem: Health Behavior/Discharge Planning: Goal: Ability to identify and utilize available resources and services will improve Outcome: Progressing Goal: Ability to manage health-related needs will improve Outcome: Progressing   Problem: Metabolic: Goal: Ability to maintain appropriate glucose levels will improve Outcome: Progressing   Problem: Nutritional: Goal: Maintenance of adequate nutrition will improve Outcome: Progressing Goal: Progress toward achieving an optimal weight will improve Outcome: Progressing   Problem: Skin Integrity: Goal: Risk for impaired skin integrity will  decrease Outcome: Progressing   Problem: Tissue Perfusion: Goal: Adequacy of tissue perfusion will improve Outcome: Progressing   Problem: Education: Goal: Ability to describe self-care measures that may prevent or decrease complications (Diabetes Survival Skills Education) will improve Outcome: Progressing Goal: Individualized Educational Video(s) Outcome: Progressing   Problem: Cardiac: Goal: Ability to maintain an adequate cardiac output will improve Outcome: Progressing   Problem: Health Behavior/Discharge Planning: Goal: Ability to identify and utilize available resources and services will improve Outcome: Progressing Goal: Ability to manage health-related needs  will improve Outcome: Progressing   Problem: Fluid Volume: Goal: Ability to achieve a balanced intake and output will improve Outcome: Progressing   Problem: Metabolic: Goal: Ability to maintain appropriate glucose levels will improve Outcome: Progressing

## 2023-06-25 NOTE — Progress Notes (Signed)
  Echocardiogram 2D Echocardiogram has been performed.  Sara Stafford 06/25/2023, 9:36 AM

## 2023-06-25 NOTE — Progress Notes (Signed)
Date and time results received: 06/25/23 1700 (use smartphrase ".now" to insert current time)  Test: Sodium Critical Value: 115  Name of Provider Notified: Dr. Arbutus Leas  Orders Received? Or Actions Taken?: MD and Primary RN notified. Awaiting orders.

## 2023-06-25 NOTE — ED Notes (Signed)
Pt placed on bedpan to provide urine specimen (still on room air) and became very short of breath, heart rate into 150s and spo2 75%. Placed pt on 4 l with improvement, spo2 99% and hr trending down. Titrated oxygen to 2l with spo2 maintaining 99% and hr 110 currently. Pt with hypoxia on exertion.

## 2023-06-25 NOTE — Progress Notes (Signed)
Date and time results received: 06/25/23 1353 (use smartphrase ".now" to insert current time)  Test: Sodium  Critical Value: 114  Name of Provider Notified: Dr Tat  Orders Received? Or Actions Taken?:  No new orders currently

## 2023-06-26 DIAGNOSIS — E1165 Type 2 diabetes mellitus with hyperglycemia: Secondary | ICD-10-CM | POA: Diagnosis not present

## 2023-06-26 DIAGNOSIS — E871 Hypo-osmolality and hyponatremia: Secondary | ICD-10-CM | POA: Diagnosis not present

## 2023-06-26 DIAGNOSIS — J441 Chronic obstructive pulmonary disease with (acute) exacerbation: Secondary | ICD-10-CM | POA: Diagnosis not present

## 2023-06-26 DIAGNOSIS — J9601 Acute respiratory failure with hypoxia: Secondary | ICD-10-CM | POA: Diagnosis not present

## 2023-06-26 LAB — HEPATIC FUNCTION PANEL
ALT: 24 U/L (ref 0–44)
AST: 30 U/L (ref 15–41)
Albumin: 3.1 g/dL — ABNORMAL LOW (ref 3.5–5.0)
Alkaline Phosphatase: 91 U/L (ref 38–126)
Bilirubin, Direct: 0.1 mg/dL (ref 0.0–0.2)
Indirect Bilirubin: 0.4 mg/dL (ref 0.3–0.9)
Total Bilirubin: 0.5 mg/dL (ref 0.0–1.2)
Total Protein: 6.8 g/dL (ref 6.5–8.1)

## 2023-06-26 LAB — BASIC METABOLIC PANEL
Anion gap: 11 (ref 5–15)
Anion gap: 11 (ref 5–15)
Anion gap: 12 (ref 5–15)
Anion gap: 11 (ref 5–15)
Anion gap: 11 (ref 5–15)
BUN: 21 mg/dL (ref 8–23)
BUN: 21 mg/dL (ref 8–23)
BUN: 22 mg/dL (ref 8–23)
BUN: 24 mg/dL — ABNORMAL HIGH (ref 8–23)
BUN: 50 mg/dL — ABNORMAL HIGH (ref 8–23)
CO2: 26 mmol/L (ref 22–32)
CO2: 27 mmol/L (ref 22–32)
CO2: 25 mmol/L (ref 22–32)
CO2: 27 mmol/L (ref 22–32)
CO2: 28 mmol/L (ref 22–32)
Calcium: 7.5 mg/dL — ABNORMAL LOW (ref 8.9–10.3)
Calcium: 7.6 mg/dL — ABNORMAL LOW (ref 8.9–10.3)
Calcium: 7.5 mg/dL — ABNORMAL LOW (ref 8.9–10.3)
Calcium: 8.3 mg/dL — ABNORMAL LOW (ref 8.9–10.3)
Calcium: 8.6 mg/dL — ABNORMAL LOW (ref 8.9–10.3)
Chloride: 80 mmol/L — ABNORMAL LOW (ref 98–111)
Chloride: 80 mmol/L — ABNORMAL LOW (ref 98–111)
Chloride: 77 mmol/L — ABNORMAL LOW (ref 98–111)
Chloride: 77 mmol/L — ABNORMAL LOW (ref 98–111)
Chloride: 78 mmol/L — ABNORMAL LOW (ref 98–111)
Creatinine, Ser: 0.75 mg/dL (ref 0.44–1.00)
Creatinine, Ser: 0.71 mg/dL (ref 0.44–1.00)
Creatinine, Ser: 0.78 mg/dL (ref 0.44–1.00)
Creatinine, Ser: 0.87 mg/dL (ref 0.44–1.00)
Creatinine, Ser: 0.86 mg/dL (ref 0.44–1.00)
GFR, Estimated: >60 mL/min (ref >60)
GFR, Estimated: >60 mL/min (ref >60)
GFR, Estimated: >60 mL/min (ref >60)
GFR, Estimated: >60 mL/min (ref >60)
GFR, Estimated: >60 mL/min (ref >60)
Glucose, Bld: 236 mg/dL — ABNORMAL HIGH (ref 70–99)
Glucose, Bld: 272 mg/dL — ABNORMAL HIGH (ref 70–99)
Glucose, Bld: 337 mg/dL — ABNORMAL HIGH (ref 70–99)
Glucose, Bld: 222 mg/dL — ABNORMAL HIGH (ref 70–99)
Glucose, Bld: 185 mg/dL — ABNORMAL HIGH (ref 70–99)
Potassium: 4.9 mmol/L (ref 3.5–5.1)
Potassium: 5.1 mmol/L (ref 3.5–5.1)
Potassium: 4.2 mmol/L (ref 3.5–5.1)
Potassium: 5.6 mmol/L — ABNORMAL HIGH (ref 3.5–5.1)
Potassium: 6 mmol/L — ABNORMAL HIGH (ref 3.5–5.1)
Sodium: 117 mmol/L — CL (ref 135–145)
Sodium: 118 mmol/L — CL (ref 135–145)
Sodium: 114 mmol/L — CL (ref 135–145)
Sodium: 115 mmol/L — CL (ref 135–145)
Sodium: 117 mmol/L — CL (ref 135–145)

## 2023-06-26 LAB — GLUCOSE, CAPILLARY
Glucose-Capillary: 316 mg/dL — ABNORMAL HIGH (ref 70–99)
Glucose-Capillary: 165 mg/dL — ABNORMAL HIGH (ref 70–99)
Glucose-Capillary: 188 mg/dL — ABNORMAL HIGH (ref 70–99)
Glucose-Capillary: 205 mg/dL — ABNORMAL HIGH (ref 70–99)

## 2023-06-26 LAB — SODIUM: Sodium: 116 mmol/L — CL (ref 135–145)

## 2023-06-26 LAB — MAGNESIUM: Magnesium: 1.3 mg/dL — ABNORMAL LOW (ref 1.7–2.4)

## 2023-06-26 MED ORDER — INSULIN GLARGINE-YFGN 100 UNIT/ML ~~LOC~~ SOLN
15.0000 [IU] | Freq: Every day | SUBCUTANEOUS | Status: DC
  Filled 2023-06-26 (×2): qty 0.15

## 2023-06-26 MED ORDER — SODIUM CHLORIDE 1 G PO TABS
1.0000 g | ORAL_TABLET | Freq: Two times a day (BID) | ORAL | Status: DC
  Administered 2023-06-26 – 2023-06-28 (×5): 1 g via ORAL
  Filled 2023-06-26 (×5): qty 1

## 2023-06-26 MED ORDER — INSULIN ASPART 100 UNIT/ML IJ SOLN
5.0000 [IU] | Freq: Three times a day (TID) | INTRAMUSCULAR | Status: DC
  Administered 2023-06-26 – 2023-07-03 (×18): 5 [IU] via SUBCUTANEOUS

## 2023-06-26 MED ORDER — ACETYLCYSTEINE 10% NICU INHALATION SOLUTION
2.0000 mL | Freq: Two times a day (BID) | RESPIRATORY_TRACT | Status: DC
  Administered 2023-06-26 – 2023-06-28 (×4): 2 mL via RESPIRATORY_TRACT
  Filled 2023-06-26 (×8): qty 2

## 2023-06-26 MED ORDER — UREA 15 G PO PACK
15.0000 g | PACK | Freq: Two times a day (BID) | ORAL | Status: DC
  Administered 2023-06-26 (×2): 15 g via ORAL
  Filled 2023-06-26 (×6): qty 1

## 2023-06-26 MED ORDER — MAGNESIUM SULFATE 4 GM/100ML IV SOLN
4.0000 g | Freq: Once | INTRAVENOUS | Status: AC
  Administered 2023-06-26: 4 g via INTRAVENOUS
  Filled 2023-06-26: qty 100

## 2023-06-26 MED ORDER — SODIUM ZIRCONIUM CYCLOSILICATE 10 G PO PACK
10.0000 g | PACK | Freq: Once | ORAL | Status: AC
  Administered 2023-06-26: 10 g via ORAL
  Filled 2023-06-26: qty 1

## 2023-06-26 MED ORDER — SODIUM ZIRCONIUM CYCLOSILICATE 10 G PO PACK
10.0000 g | PACK | Freq: Once | ORAL | Status: AC
  Administered 2023-06-27: 10 g via ORAL
  Filled 2023-06-26: qty 1

## 2023-06-26 NOTE — Progress Notes (Signed)
PROGRESS NOTE  Sara Stafford ZOX:[id number] DOB: 1945-08-16 DOA: 06/24/2023 PCP: Danella Penton, MD  Brief History:  78 year old female with a history of diabetes mellitus type 2, hypertension, hyperlipidemia, anxiety/depression, COPD, tobacco abuse in remission presenting with 3-week history of shortness of breath, wheezing, coughing, and chest congestion.  The patient went to see her PCP on 06/20/2023.  The patient received IM injection of Kenalog.  She was given a prescription for azithromycin and cefdinir.  She finished azithromycin.  She quit taking the cefdinir after 3 doses because she felt like it was making her symptoms worse.  She denied any fevers, chills, headache, chest pain, abdominal pain.  She has some nausea and posttussive emesis.  She had some loose stools without any hematochezia or melena.  She denies any abdominal pain, dysuria, hematuria.  She denies any hemoptysis. Unfortunately, her shortness of breath continue to worsen.  She also noted some lower extremity edema.  As result, she presented for further evaluation and treatment.  She was noted to have oxygen saturation 95% on room air by EMS.  Notably, the patient states that she has been taking sodium chloride twice daily for the past year because of low sodium.  In addition, she states that she has not taken Effexor for over 1 year.  She has only taken 3 doses of Paxil. She had a hospital admission from 07/10/2022 to 07/11/2022 secondary to hyponatremia.  She was started on sodium chloride during this hospitalization.  This was continued until the time of this admission. The patient also was hospitalized from 04/25/2022 to 04/28/2022 for hyponatremia.  She improved with normal saline and stopping her Effexor.  In the ED, the patient was afebrile hemodynamically stable with oxygen saturation 98% on 2 L. WBC 12.2, hemoglobin 10.3, platelets 467.  Sodium 115, potassium 3.6, bicarbonate 22, serum creatinine 0.50.   LFTs were unremarkable.  Chest x-ray showed increased interstitial markings.  The patient was started on normal saline, bronchodilators, and Solu-Medrol.  COVID-19 PCR is negative.  TSH 1.326.  BNP 567.  Troponin 50>> 42.      Assessment/Plan: Acute respiratory failure with hypoxia -Multifactorial including COPD exacerbation and fluid overload -Stable on 2 L nasal cannula -Wean oxygen as tolerated for saturation greater 92% -Obtain VBG 7.37/43/36/24   COPD exacerbation -COVID-19 PCR negative -Check viral respiratory panel+rhino/enterovirus -MRSA screen negative -Check PCT 0.14 -Continue DuoNebs -Started Brovana -Started Pulmicort -Patient has approx 30-pack-year history of tobacco;, quit 20 years ago   Fluid overload -Echo EF 65-70%, no WMA, G1DD, normal RVF -Start IV furosemide -Daily weights -Accurate I's and O's -obtain ReDS =19   Increased troponin -Secondary to demand ischemia -Chest pain presently -Personally reviewed EKG--sinus rhythm, nonspecific T wave change -Echo EF 65-70%, no WMA, G1DD, normal RVF   Hyponatremia -multifactorial including fluid overload, SIADH, meds -She appears clinically fluid overloaded -Continue Check sodium every 4 hours -Give Lasix IV and reassess -Urine osmolarity--412 -Serum osmolarity--259 -Urine sodium 25 -06/25/23 Na = 115, but serum glucose 380 -appreciate nephrology consult>>add Urea BID   Anxiety/depression -Continue home dose alprazolam -PDMP reviewed--alprazolam, 0.25 mg, #120, last refill 05/30/2023   Uncontrolled diabetes mellitus type 2 with hyperglycemia -06/25/23 A1C-9.5 -pt was on insulin drip short time 1/26 -Use resistant sliding scale -Holding metformin and glipizide -add novolog 5 units with meals   Essential hypertension -Continue amlodipine and metoprolol   Mixed hyperlipidemia -Continue statin   Leg pain/edema -venous duplex--neg   Hypomagnesemia -  replete               Family  Communication:   no Family at bedside   Consultants:  none   Code Status:  FULL    DVT Prophylaxis:  [place] Lovenox     Procedures: As Listed in Progress Note Above   Antibiotics: None    Procedures: As Listed in Progress Note Above  Antibiotics: None     Subjective: Patient denies fevers, chills, headache, chest pain, dyspnea, nausea, vomiting, diarrhea, abdominal pain, dysuria, hematuria, hematochezia, and melena.   Objective: Vitals:   06/26/23 1400 06/26/23 1500 06/26/23 1524 06/26/23 1601  BP: (!) 144/65 139/66    Pulse: 89 94    Resp: 20 20    Temp:    98 F (36.7 C)  TempSrc:    Axillary  SpO2: 92% 94% 96%   Weight:      Height:        Intake/Output Summary (Last 24 hours) at 06/26/2023 1700 Last data filed at 06/26/2023 1250 Gross per 24 hour  Intake 240 ml  Output 1650 ml  Net -1410 ml   Weight change: 6.229 kg Exam:  General:  Pt is alert, follows commands appropriately, not in acute distress HEENT: No icterus, No thrush, No neck mass, [place]/AT Cardiovascular: RRR, S1/S2, no rubs, no gallops Respiratory: fine bibasilar crackles.  No wheeze Abdomen: Soft/+BS, non tender, non distended, no guarding Extremities: trace LE edema, No lymphangitis, No petechiae, No rashes, no synovitis   Data Reviewed: I have personally reviewed following labs and imaging studies Basic Metabolic Panel: Recent Labs  Lab 06/25/23 0548 06/25/23 0947 06/25/23 2046 06/26/23 0115 06/26/23 0424 06/26/23 0900 06/26/23 1243  NA 115*   < > 116* 117* 118* 114* 115*  K 3.7   < > 4.5 4.9 5.1 4.2 5.6*  CL 81*   < > 81* 80* 80* 77* 77*  CO2 18*   < > 23 26 27 25 27   GLUCOSE 380*   < > 306* 236* 272* 337* 222*  BUN 15   < > 21 21 21 22  24*  CREATININE 0.66   < > 0.76 0.75 0.71 0.78 0.87  CALCIUM 6.8*   < > 7.6* 7.5* 7.6* 7.5* 8.3*  MG 0.8*  --   --   --  1.3*  --   --   PHOS 2.5  --   --   --   --   --   --    < > = values in this interval not displayed.   Liver Function  Tests: Recent Labs  Lab 06/24/23 2223 06/25/23 0548 06/26/23 1243  AST 21 19 30   ALT 21 21 24   ALKPHOS 96 89 91  BILITOT 0.4 0.9 0.5  PROT 6.1* 6.1* 6.8  ALBUMIN 2.7* 2.6* 3.1*   No results for input(s): "LIPASE", "AMYLASE" in the last 168 hours. No results for input(s): "AMMONIA" in the last 168 hours. Coagulation Profile: No results for input(s): "INR", "PROTIME" in the last 168 hours. CBC: Recent Labs  Lab 06/24/23 2223 06/25/23 0548  WBC 12.2* 9.2  NEUTROABS 10.6*  --   HGB 10.3* 9.9*  HCT 28.7* 28.6*  MCV 81.8 82.2  PLT 467* 417*   Cardiac Enzymes: No results for input(s): "CKTOTAL", "CKMB", "CKMBINDEX", "TROPONINI" in the last 168 hours. BNP: Invalid input(s): "POCBNP" CBG: Recent Labs  Lab 06/25/23 1602 06/25/23 2102 06/26/23 0757 06/26/23 1151 06/26/23 1558  GLUCAP 184* 358* 316* 165* 188*   HbA1C:  Recent Labs    06/25/23 0548  HGBA1C 9.5*   Urine analysis:    Component Value Date/Time   COLORURINE YELLOW (A) 04/25/2022 1857   APPEARANCEUR HAZY (A) 04/25/2022 1857   APPEARANCEUR Hazy (A) 01/26/2021 1309   LABSPEC 1.011 04/25/2022 1857   PHURINE 5.0 04/25/2022 1857   GLUCOSEU NEGATIVE 04/25/2022 1857   GLUCOSEU NEGATIVE 02/29/2012 1116   HGBUR NEGATIVE 04/25/2022 1857   HGBUR large 01/01/2010 1204   BILIRUBINUR NEGATIVE 04/25/2022 1857   BILIRUBINUR Negative 01/26/2021 1309   KETONESUR NEGATIVE 04/25/2022 1857   PROTEINUR NEGATIVE 04/25/2022 1857   UROBILINOGEN 0.2 02/29/2012 1116   NITRITE NEGATIVE 04/25/2022 1857   LEUKOCYTESUR NEGATIVE 04/25/2022 1857   Sepsis Labs: @LABRCNTIP (procalcitonin:4,lacticidven:4) ) Recent Results (from the past 240 hours)  Resp panel by RT-PCR (RSV, Flu A&B, Covid) Anterior Nasal Swab     Status: None   Collection Time: 06/24/23 10:24 PM   Specimen: Anterior Nasal Swab  Result Value Ref Range Status   SARS Coronavirus 2 by RT PCR NEGATIVE NEGATIVE Final    Comment: (NOTE) SARS-CoV-2 target nucleic  acids are NOT DETECTED.  The SARS-CoV-2 RNA is generally detectable in upper respiratory specimens during the acute phase of infection. The lowest concentration of SARS-CoV-2 viral copies this assay can detect is 138 copies/mL. A negative result does not preclude SARS-Cov-2 infection and should not be used as the sole basis for treatment or other patient management decisions. A negative result may occur with  improper specimen collection/handling, submission of specimen other than nasopharyngeal swab, presence of viral mutation(s) within the areas targeted by this assay, and inadequate number of viral copies(<138 copies/mL). A negative result must be combined with clinical observations, patient history, and epidemiological information. The expected result is Negative.  Fact Sheet for Patients:  BloggerCourse.com  Fact Sheet for Healthcare Providers:  SeriousBroker.it  This test is no t yet approved or cleared by the [place] FDA and  has been authorized for detection and/or diagnosis of SARS-CoV-2 by FDA under an Emergency Use Authorization (EUA). This EUA will remain  in effect (meaning this test can be used) for the duration of the COVID-19 declaration under Section 564(b)(1) of the Act, 21 U.S.C.section 360bbb-3(b)(1), unless the authorization is terminated  or revoked sooner.       Influenza A by PCR NEGATIVE NEGATIVE Final   Influenza B by PCR NEGATIVE NEGATIVE Final    Comment: (NOTE) The Xpert Xpress SARS-CoV-2/FLU/RSV plus assay is intended as an aid in the diagnosis of influenza from Nasopharyngeal swab specimens and should not be used as a sole basis for treatment. Nasal washings and aspirates are unacceptable for Xpert Xpress SARS-CoV-2/FLU/RSV testing.  Fact Sheet for Patients: BloggerCourse.com  Fact Sheet for Healthcare Providers: SeriousBroker.it  This  test is not yet approved or cleared by the [place] FDA and has been authorized for detection and/or diagnosis of SARS-CoV-2 by FDA under an Emergency Use Authorization (EUA). This EUA will remain in effect (meaning this test can be used) for the duration of the COVID-19 declaration under Section 564(b)(1) of the Act, 21 U.S.C. section 360bbb-3(b)(1), unless the authorization is terminated or revoked.     Resp Syncytial Virus by PCR NEGATIVE NEGATIVE Final    Comment: (NOTE) Fact Sheet for Patients: BloggerCourse.com  Fact Sheet for Healthcare Providers: SeriousBroker.it  This test is not yet approved or cleared by the [place] FDA and has been authorized for detection and/or diagnosis of SARS-CoV-2 by FDA under an Emergency Use Authorization (  EUA). This EUA will remain in effect (meaning this test can be used) for the duration of the COVID-19 declaration under Section 564(b)(1) of the Act, 21 U.S.C. section 360bbb-3(b)(1), unless the authorization is terminated or revoked.  Performed at [facility], [address], [place], [place] [address]   MRSA Next Gen by PCR, Nasal     Status: None   Collection Time: 06/25/23 12:57 AM   Specimen: Nasal Mucosa; Nasal Swab  Result Value Ref Range Status   MRSA by PCR Next Gen NOT DETECTED NOT DETECTED Final    Comment: (NOTE) The GeneXpert MRSA Assay (FDA approved for NASAL specimens only), is one component of a comprehensive MRSA colonization surveillance program. It is not intended to diagnose MRSA infection nor to guide or monitor treatment for MRSA infections. Test performance is not FDA approved in patients less than 57 years old. Performed at [facility], [address], [place], [place] [address]   Respiratory (~20 pathogens) panel by PCR     Status: Abnormal   Collection Time: 06/25/23  8:18 AM   Specimen: Nasopharyngeal Swab; Respiratory  Result Value Ref Range Status    Adenovirus NOT DETECTED NOT DETECTED Final   Coronavirus 229E NOT DETECTED NOT DETECTED Final    Comment: (NOTE) The Coronavirus on the Respiratory Panel, DOES NOT test for the novel  Coronavirus (2019 nCoV)    Coronavirus HKU1 NOT DETECTED NOT DETECTED Final   Coronavirus NL63 NOT DETECTED NOT DETECTED Final   Coronavirus OC43 NOT DETECTED NOT DETECTED Final   Metapneumovirus NOT DETECTED NOT DETECTED Final   Rhinovirus / Enterovirus DETECTED (A) NOT DETECTED Final   Influenza A NOT DETECTED NOT DETECTED Final   Influenza B NOT DETECTED NOT DETECTED Final   Parainfluenza Virus 1 NOT DETECTED NOT DETECTED Final   Parainfluenza Virus 2 NOT DETECTED NOT DETECTED Final   Parainfluenza Virus 3 NOT DETECTED NOT DETECTED Final   Parainfluenza Virus 4 NOT DETECTED NOT DETECTED Final   Respiratory Syncytial Virus NOT DETECTED NOT DETECTED Final   Bordetella pertussis NOT DETECTED NOT DETECTED Final   Bordetella Parapertussis NOT DETECTED NOT DETECTED Final   Chlamydophila pneumoniae NOT DETECTED NOT DETECTED Final   Mycoplasma pneumoniae NOT DETECTED NOT DETECTED Final    Comment: Performed at [facility], [address], [place], [place] [address]     Scheduled Meds:  acetylcysteine  2 mL Nebulization BID   ALPRAZolam  0.25 mg Oral BID   amLODipine  10 mg Oral Daily   arformoterol  15 mcg Nebulization BID   budesonide (PULMICORT) nebulizer solution  0.5 mg Nebulization BID   calcium carbonate  1 tablet Oral Q breakfast   Chlorhexidine Gluconate Cloth  6 each Topical Daily   dextromethorphan-guaiFENesin  1 tablet Oral BID   enoxaparin (LOVENOX) injection  40 mg Subcutaneous Q24H   feeding supplement (GLUCERNA SHAKE)  237 mL Oral TID BM   furosemide  40 mg Intravenous Daily   insulin aspart  0-20 Units Subcutaneous TID WC   insulin aspart  0-5 Units Subcutaneous QHS   insulin aspart  5 Units Subcutaneous TID WC   insulin glargine-yfgn  12 Units Subcutaneous Daily    ipratropium-albuterol  3 mL Nebulization Q4H   methylPREDNISolone (SOLU-MEDROL) injection  40 mg Intravenous Q12H   metoprolol tartrate  25 mg Oral BID   pantoprazole  40 mg Oral Daily   rosuvastatin  20 mg Oral QHS   sodium chloride  1 g Oral BID WC  urea  15 g Oral BID   Continuous Infusions:  insulin Stopped (06/25/23 1604)    Procedures/Studies: US Venous Img Lower Bilateral (DVT) Result Date: 06/25/2023 CLINICAL DATA:  Chest pain and lower extremity edema EXAM: BILATERAL LOWER EXTREMITY VENOUS DOPPLER ULTRASOUND TECHNIQUE: Gray-scale sonography with graded compression, as well as color Doppler and duplex ultrasound were performed to evaluate the lower extremity deep venous systems from the level of the common femoral vein and including the common femoral, femoral, profunda femoral, popliteal and calf veins including the posterior tibial, peroneal and gastrocnemius veins when visible. The superficial great saphenous vein was also interrogated. Spectral Doppler was utilized to evaluate flow at rest and with distal augmentation maneuvers in the common femoral, femoral and popliteal veins. COMPARISON:  None Available. FINDINGS: RIGHT LOWER EXTREMITY Common Femoral Vein: No evidence of thrombus. Normal compressibility, respiratory phasicity and response to augmentation. Saphenofemoral Junction: No evidence of thrombus. Normal compressibility and flow on color Doppler imaging. Profunda Femoral Vein: No evidence of thrombus. Normal compressibility and flow on color Doppler imaging. Femoral Vein: No evidence of thrombus. Normal compressibility, respiratory phasicity and response to augmentation. Popliteal Vein: No evidence of thrombus. Normal compressibility, respiratory phasicity and response to augmentation. Calf Veins: No evidence of thrombus. Normal compressibility and flow on color Doppler imaging. Superficial Great Saphenous Vein: No evidence of thrombus. Normal compressibility. Venous Reflux:   None. Other Findings:  None. LEFT LOWER EXTREMITY Common Femoral Vein: No evidence of thrombus. Normal compressibility, respiratory phasicity and response to augmentation. Saphenofemoral Junction: No evidence of thrombus. Normal compressibility and flow on color Doppler imaging. Profunda Femoral Vein: No evidence of thrombus. Normal compressibility and flow on color Doppler imaging. Femoral Vein: No evidence of thrombus. Normal compressibility, respiratory phasicity and response to augmentation. Popliteal Vein: No evidence of thrombus. Normal compressibility, respiratory phasicity and response to augmentation. Calf Veins: No evidence of thrombus. Normal compressibility and flow on color Doppler imaging. Superficial Great Saphenous Vein: No evidence of thrombus. Normal compressibility. Venous Reflux:  None. Other Findings:  None. IMPRESSION: No evidence of deep venous thrombosis in either lower extremity. Electronically Signed   By: Malachy Moan M.D.   On: 06/25/2023 11:31   CT Angio Chest Pulmonary Embolism (PE) W or WO Contrast Result Date: 06/25/2023 CLINICAL DATA:  PE suspected, positive D-dimer, shortness of breath EXAM: CT ANGIOGRAPHY CHEST WITH CONTRAST TECHNIQUE: Multidetector CT imaging of the chest was performed using the standard protocol during bolus administration of intravenous contrast. Multiplanar CT image reconstructions and MIPs were obtained to evaluate the vascular anatomy. RADIATION DOSE REDUCTION: This exam was performed according to the departmental dose-optimization program which includes automated exposure control, adjustment of the mA and/or kV according to patient size and/or use of iterative reconstruction technique. CONTRAST:  75mL OMNIPAQUE IOHEXOL 350 MG/ML SOLN COMPARISON:  05/20/2022 FINDINGS: Cardiovascular: Satisfactory opacification of the pulmonary arteries to the segmental level. No evidence of pulmonary embolism. Normal heart size. Left coronary artery calcifications. No  pericardial effusion. Aortic atherosclerosis. Mediastinum/Nodes: No enlarged mediastinal, hilar, or axillary lymph nodes. Thyroid gland, trachea, and esophagus demonstrate no significant findings. Lungs/Pleura: Small right, trace left pleural effusions. Mild centrilobular emphysema. Diffuse bilateral bronchial wall thickening. Upper Abdomen: No acute abnormality.  Status post cholecystectomy. Musculoskeletal: No chest wall abnormality. No acute osseous findings. Review of the MIP images confirms the above findings. IMPRESSION: 1. Negative examination for pulmonary embolism. 2. Small right, trace left pleural effusions. 3. Mild emphysema and diffuse bilateral bronchial wall thickening. 4. Coronary artery disease. Aortic Atherosclerosis (  ICD10-I70.0). Electronically Signed   By: Jearld Lesch M.D.   On: 06/25/2023 11:22   ECHOCARDIOGRAM COMPLETE Result Date: 06/25/2023    ECHOCARDIOGRAM REPORT   Patient Name:   Sara Stafford Date of Exam: 06/25/2023 Medical Rec #:  [id number]            Height:       65.0 in Accession #:    [id number]           Weight:       130.5 lb Date of Birth:  November 12, 1945            BSA:          1.650 m Patient Age:    77 years             BP:           188/98 mmHg Patient Gender: F                    HR:           106 bpm. Exam Location:  Inpatient Procedure: 2D Echo, Cardiac Doppler, Color Doppler and Intracardiac            Opacification Agent Indications:    CHF Acute Systolic  History:        Patient has prior history of Echocardiogram examinations and                 Patient has no prior history of Echocardiogram examinations.                 Risk Factors:Hypertension, Diabetes, Dyslipidemia and Former                 Smoker.  Sonographer:    Karma Ganja Referring Phys: [id number] OLADAPO ADEFESO  Sonographer Comments: Technically challenging study due to limited acoustic windows. Image acquisition challenging due to patient body habitus. IMPRESSIONS  1. Left ventricular ejection  fraction, by estimation, is 65 to 70%. The left ventricle has normal function. The left ventricle has no regional wall motion abnormalities. Left ventricular diastolic parameters are consistent with Grade I diastolic dysfunction (impaired relaxation). Elevated left atrial pressure.  2. Right ventricular systolic function is normal. The right ventricular size is normal.  3. The mitral valve is normal in structure. Trivial mitral valve regurgitation. No evidence of mitral stenosis.  4. The aortic valve was not well visualized. Aortic valve regurgitation is not visualized. No aortic stenosis is present.  5. The inferior vena cava is normal in size with greater than 50% respiratory variability, suggesting right atrial pressure of 3 mmHg. Comparison(s): No prior Echocardiogram. FINDINGS  Left Ventricle: Left ventricular ejection fraction, by estimation, is 65 to 70%. The left ventricle has normal function. The left ventricle has no regional wall motion abnormalities. Definity contrast agent was given IV to delineate the left ventricular  endocardial borders. The left ventricular internal cavity size was normal in size. There is no left ventricular hypertrophy. Left ventricular diastolic parameters are consistent with Grade I diastolic dysfunction (impaired relaxation). Elevated left atrial pressure. Right Ventricle: The right ventricular size is normal. Right ventricular systolic function is normal. Left Atrium: Left atrial size was normal in size. Right Atrium: Right atrial size was normal in size. Pericardium: There is no evidence of pericardial effusion. Mitral Valve: The mitral valve is normal in structure. Trivial mitral valve regurgitation. No evidence of mitral valve stenosis. Tricuspid Valve: The tricuspid valve is normal in structure. Tricuspid valve  regurgitation is not demonstrated. No evidence of tricuspid stenosis. Aortic Valve: The aortic valve was not well visualized. Aortic valve regurgitation is not  visualized. No aortic stenosis is present. Aortic valve mean gradient measures 4.0 mmHg. Aortic valve peak gradient measures 7.4 mmHg. Pulmonic Valve: The pulmonic valve was not well visualized. Pulmonic valve regurgitation is not visualized. No evidence of pulmonic stenosis. Aorta: The aortic root is normal in size and structure. Venous: The inferior vena cava is normal in size with greater than 50% respiratory variability, suggesting right atrial pressure of 3 mmHg. IAS/Shunts: No atrial level shunt detected by color flow Doppler.  LEFT VENTRICLE PLAX 2D LVIDd:         4.50 cm     Diastology LVIDs:         3.50 cm     LV e' medial:    6.42 cm/s LV PW:         0.70 cm     LV E/e' medial:  17.0 LV IVS:        0.80 cm     LV e' lateral:   10.10 cm/s LVOT diam:     1.80 cm     LV E/e' lateral: 10.8 LV SV:         59 LV SV Index:   36 LVOT Area:     2.54 cm  LV Volumes (MOD) LV vol d, MOD A2C: 39.1 ml LV vol d, MOD A4C: 50.3 ml LV vol s, MOD A2C: 13.0 ml LV vol s, MOD A4C: 23.9 ml LV SV MOD A2C:     26.1 ml LV SV MOD A4C:     50.3 ml LV SV MOD BP:      28.2 ml RIGHT VENTRICLE            IVC RV S prime:     7.40 cm/s  IVC diam: 2.10 cm LEFT ATRIUM             Index        RIGHT ATRIUM          Index LA diam:        3.30 cm 2.00 cm/m   RA Area:     9.49 cm LA Vol (A2C):   43.9 ml 26.61 ml/m  RA Volume:   20.80 ml 12.61 ml/m LA Vol (A4C):   33.6 ml 20.36 ml/m LA Biplane Vol: 38.6 ml 23.39 ml/m  AORTIC VALVE AV Area (Vmax):    2.34 cm AV Area (Vmean):   2.12 cm AV Area (VTI):     2.50 cm AV Vmax:           136.00 cm/s AV Vmean:          90.500 cm/s AV VTI:            0.236 m AV Peak Grad:      7.4 mmHg AV Mean Grad:      4.0 mmHg LVOT Vmax:         125.00 cm/s LVOT Vmean:        75.500 cm/s LVOT VTI:          0.232 m LVOT/AV VTI ratio: 0.98  AORTA Ao Root diam: 2.60 cm MITRAL VALVE MV Area (PHT): 7.82 cm     SHUNTS MV Decel Time: 97 msec      Systemic VTI:  0.23 m MR Peak grad: 132.7 mmHg    Systemic Diam: 1.80  cm MR Vmax:      576.00  cm/s MV E velocity: 109.00 cm/s MV A velocity: 117.00 cm/s MV E/A ratio:  0.93 Olga Millers MD Electronically signed by Olga Millers MD Signature Date/Time: 06/25/2023/11:20:12 AM    Final    DG Chest 2 View Result Date: 06/24/2023 CLINICAL DATA:  Cough and dyspnea EXAM: CHEST - 2 VIEW COMPARISON:  None Available. FINDINGS: There are increased central interstitial markings bilaterally. There is no lung consolidation, pleural effusion or pneumothorax. The cardiomediastinal silhouette is within normal limits. No acute fractures are seen. IMPRESSION: Increased central interstitial markings bilaterally may reflect atypical/viral infection. No lung consolidation. Electronically Signed   By: Darliss Cheney M.D.   On: 06/24/2023 22:58    Catarina Hartshorn, DO  [facility]  If 7PM-7AM, please contact night-coverage [email or web address] Password [email or web address] 06/26/2023, 5:00 PM   LOS: 2 days

## 2023-06-26 NOTE — Progress Notes (Signed)
Gave patient breathing treatments, and patient did flutter after treatments X10.  Patient also wanted CPT done with bed, so did CPT through bed as well.

## 2023-06-26 NOTE — Consult Note (Addendum)
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 Na was as low as 111 on 1/26, Na up to 118 today so appropriate correction for now -agree with lasix. Adding fluid restriction (1.2L/day)--discussed in detail with patient today. Will add urea 15g BID if Na fails to improve. If stalling then can consider a dose of tolvaptan (will need to d/c her fluid restriction if that were the case). Holding off on salt tabs given that she had some swelling on presentation but can certainly consider this as well. -agree with checking Na q4h. Avoid overcorrection. Replete K if needed as this will help with osm, K 5.1 today, will stop her kcl supplement (was on daily)  AHRF COPD exacerbation -per primary, receiving methylpred  Uncontrolled Diabetes Mellitus Type 2 with Hyperglycemia -per primary  Fluid overload -agree with lasix 40mg  IV daily. Monitor strict I/O and daily weights  HTN -uncontrolled -on norvasc and metoprolol. Consider adding hydralazine if needed  Anemia -transfuse PRN, w/u +mgmt per primary  Recommendations conveyed to primary service.   Addendum: most recent Na reviewed from 9am: down to 114, corrected Na for hyperglycemia is around 118. Agree with the addition of urea 15g BID. Next step is to consider salt tabs. Preemptively checking LFT's for the possibility of tolvaptan. Please call or page covering/on-call nephrologist with any questions/concerns.  Addendum: most recent Na up to 115 at 12:43pm. Salt tabs 1g BID first dose now ordered.   Anthony Sar [facility] 06/26/2023 8:11 AM   _____________________________________________________________________________________   History of Present Illness: Sara Stafford is a/an 78 y.o. female with a past medical history of COPD, ex tobacco abuse, hypertension,  hyperlipidemia, anxiety/depression who presents to [facility] with shortness of breath, wheezing, coughing, chest congestion.  She had seen her PCP and received an IM injection of Kenalog and was prescribed azithromycin and cefdinir.  She did have some nausea and vomiting prior to admission likely related to posttussive emesis.  There were also reports of diarrhea.  In this interim, she had some lower extremity edema.  She had been on salt tablets for the last year due to low sodium, she has been seen by [facility] for presumed SIADH in the past.  This was started back in February 2024 when she was admitted for hyponatremia.  She had a similar admission with hyponatremia in November 2023. Patient seen and examined bedside. She does report that her breathing is getting better. She is concern about depression and anxiety which has been an ongoing issue. Used to be on Effexor but stopped due to low sodium. She is only on xanax as an outpatient but is inquiring about antidepressants. She just buried her husband last Sunday. Denies any chest pain, worsening swelling, dizziness at rest, blurry vision (does have macular degen), N/V.   Medications:  Current Facility-Administered Medications  Medication Dose Route Frequency Provider Last Rate Last Admin   acetaminophen (TYLENOL) tablet 650 mg  650 mg Oral Q6H PRN Adefeso, Oladapo, DO       Or   acetaminophen (TYLENOL) suppository 650 mg  650 mg Rectal Q6H PRN Adefeso, Oladapo, DO       ALPRAZolam Prudy Feeler) tablet 0.25 mg  0.25 mg Oral BID Tat, David, MD   0.25 mg at 06/26/23 0802   amLODipine (NORVASC) tablet 10 mg  10 mg Oral Daily Adefeso, Oladapo, DO   10  mg at 06/26/23 0802   arformoterol (BROVANA) nebulizer solution 15 mcg  15 mcg Nebulization BID Tat, Onalee Hua, MD   15 mcg at 06/25/23 1852   azithromycin [facility]) tablet 250 mg  250 mg Oral Daily Adefeso, Oladapo, DO   250 mg at 06/26/23 0802   budesonide (PULMICORT) nebulizer solution 0.5  mg  0.5 mg Nebulization BID Tat, Onalee Hua, MD   0.5 mg at 06/25/23 4098   calcium carbonate (OS-CAL - dosed in mg of elemental calcium) tablet 1,250 mg  1 tablet Oral Q breakfast Adefeso, Oladapo, DO   1,250 mg at 06/26/23 0801   Chlorhexidine Gluconate Cloth 2 % PADS 6 each  6 each Topical Daily Adefeso, Oladapo, DO   6 each at 06/26/23 0807   dextromethorphan-guaiFENesin (MUCINEX DM) 30-600 MG per 12 hr tablet 1 tablet  1 tablet Oral BID Adefeso, Oladapo, DO   1 tablet at 06/26/23 0801   dextrose 50 % solution 0-50 mL  0-50 mL Intravenous PRN Tat, Onalee Hua, MD       enoxaparin (LOVENOX) injection 40 mg  40 mg Subcutaneous Q24H Adefeso, Oladapo, DO   40 mg at 06/26/23 0805   feeding supplement (GLUCERNA SHAKE) (GLUCERNA SHAKE) liquid 237 mL  237 mL Oral TID BM Adefeso, Oladapo, DO   237 mL at 06/26/23 0806   furosemide (LASIX) injection 40 mg  40 mg Intravenous Daily Tat, Onalee Hua, MD   40 mg at 06/26/23 0806   insulin aspart (novoLOG) injection 0-20 Units  0-20 Units Subcutaneous TID WC Catarina Hartshorn, MD   15 Units at 06/26/23 0805   insulin aspart (novoLOG) injection 0-5 Units  0-5 Units Subcutaneous QHS Catarina Hartshorn, MD   5 Units at 06/25/23 2101   insulin glargine-yfgn (SEMGLEE) injection 12 Units  12 Units Subcutaneous Daily Tat, David, MD   12 Units at 06/25/23 1403   insulin regular, human (MYXREDLIN) 100 units/ 100 mL infusion   Intravenous Continuous Tat, David, MD   Stopped at 06/25/23 1604   ipratropium (ATROVENT) nebulizer solution 0.5 mg  0.5 mg Nebulization Q4H PRN Adefeso, Oladapo, DO       ipratropium-albuterol (DUONEB) 0.5-2.5 (3) MG/3ML nebulizer solution 3 mL  3 mL Nebulization Q4H Adefeso, Oladapo, DO   3 mL at 06/26/23 0026   magnesium sulfate IVPB 4 g 100 mL  4 g Intravenous Once Adefeso, Oladapo, DO 50 mL/hr at 06/26/23 0633 4 g at 06/26/23 1191   methylPREDNISolone sodium succinate (SOLU-MEDROL) 40 mg/mL injection 40 mg  40 mg Intravenous Q12H Adefeso, Oladapo, DO   40 mg at 06/26/23 0806    metoprolol tartrate (LOPRESSOR) tablet 25 mg  25 mg Oral BID Adefeso, Oladapo, DO   25 mg at 06/26/23 0802   ondansetron (ZOFRAN) tablet 4 mg  4 mg Oral Q6H PRN Adefeso, Oladapo, DO       Or   ondansetron (ZOFRAN) injection 4 mg  4 mg Intravenous Q6H PRN Adefeso, Oladapo, DO       Oral care mouth rinse  15 mL Mouth Rinse PRN Adefeso, Oladapo, DO       pantoprazole (PROTONIX) EC tablet 40 mg  40 mg Oral Daily Adefeso, Oladapo, DO   40 mg at 06/26/23 0801   potassium chloride SA (KLOR-CON M) CR tablet 40 mEq  40 mEq Oral Daily Tat, David, MD   40 mEq at 06/26/23 0802   rosuvastatin (CRESTOR) tablet 20 mg  20 mg Oral QHS Adefeso, Oladapo, DO   20 mg at 06/25/23 2103  ALLERGIES Empagliflozin, Ozempic (0.25 or 0.5 mg-dose) [semaglutide(0.25 or 0.5mg -dos)], Semaglutide, Venlafaxine, Codeine, Doxycycline, and Mirtazapine  MEDICAL HISTORY Past Medical History:  Diagnosis Date   Arthritis    COPD (chronic obstructive pulmonary disease) (HCC)    no change with spiriva, poor tolerance of advair   Depressive disorder, not elsewhere classified    Diabetes mellitus    Fatigue    Fibromyalgia    GERD (gastroesophageal reflux disease)    Hyperlipidemia    Hypertension    Macular degeneration    Routine general medical examination at a health care facility      SOCIAL HISTORY Social History   Socioeconomic History   Marital status: Married    Spouse name: Not on file   Number of children: 2   Years of education: Not on file   Highest education level: Not on file  Occupational History   Occupation: retired    Associate Professor: RETIRED    Comment: [employer]  Tobacco Use   Smoking status: Former    Current packs/day: 0.00    Average packs/day: 1 pack/day for 50.0 years (50.0 ttl pk-yrs)    Types: Cigarettes    Start date: 06/06/1960    Quit date: 06/06/2010    Years since quitting: 13.0   Smokeless tobacco: Never  Vaping Use   Vaping status: Never Used  Substance and Sexual Activity    Alcohol use: No    Alcohol/week: 0.0 standard drinks of alcohol   Drug use: No   Sexual activity: Not Currently  Other Topics Concern   Not on file  Social History Narrative   Children aged 33, and 69, expecting 2nd grandchild   Social Drivers of Corporate investment banker Strain: Low Risk  (05/05/2023)   Received from [facility]   Overall Financial Resource Strain (CARDIA)    Difficulty of Paying Living Expenses: Not hard at all  Food Insecurity: No Food Insecurity (06/25/2023)   Hunger Vital Sign    Worried About Running Out of Food in the Last Year: Never true    Ran Out of Food in the Last Year: Never true  Transportation Needs: No Transportation Needs (06/25/2023)   PRAPARE - Administrator, Civil Service (Medical): No    Lack of Transportation (Non-Medical): No  Physical Activity: Not on file  Stress: Not on file  Social Connections: Moderately Isolated (06/25/2023)   Social Connection and Isolation Panel [NHANES]    Frequency of Communication with Friends and Family: More than three times a week    Frequency of Social Gatherings with Friends and Family: More than three times a week    Attends Religious Services: More than 4 times per year    Active Member of [employer] or Organizations: No    Attends Banker Meetings: Never    Marital Status: Widowed  Intimate Partner Violence: Not At Risk (06/25/2023)   Humiliation, Afraid, Rape, and Kick questionnaire    Fear of Current or Ex-Partner: No    Emotionally Abused: No    Physically Abused: No    Sexually Abused: No     FAMILY HISTORY Family History  Problem Relation Age of Onset   Stroke Father 63   Diabetes Father    Asthma Father    Liver cancer Sister 59   Pancreatic cancer Sister 5   Brain cancer Brother    Diabetes Sister    Diabetes Sister    Diabetes Sister    Diabetes Sister  Diabetes Sister    Diabetes Sister    Diabetes Sister    Diabetes Paternal Grandmother     Breast cancer Maternal Aunt 2     Review of Systems: 12 systems reviewed Otherwise as per HPI, all other systems reviewed and negative  Physical Exam: Vitals:   06/26/23 0500 06/26/23 0758  BP: (!) 164/110   Pulse: 99   Resp: (!) 29   Temp:  98.2 F (36.8 C)  SpO2: 93%    No intake/output data recorded.  Intake/Output Summary (Last 24 hours) at 06/26/2023 2956 Last data filed at 06/26/2023 0500 Gross per 24 hour  Intake 853.89 ml  Output 2000 ml  Net -1146.11 ml   General: no acute distress HEENT: anicteric sclera, oropharynx clear without lesions CV: regular rate, normal rhythm, no murmurs, no gallops, no rubs Lungs: poor air exchange diffusely, no w/r/r/c appreciated Abd: soft, non-tender, non-distended Skin: no visible lesions or rashes Psych: alert, engaged, appropriate mood and affect Musculoskeletal: trace pedal edema b/l Neuro: normal speech, no gross focal deficits   Test Results Reviewed Lab Results  Component Value Date   NA 118 (LL) 06/26/2023   K 5.1 06/26/2023   CL 80 (L) 06/26/2023   CO2 27 06/26/2023   BUN 21 06/26/2023   CREATININE 0.71 06/26/2023   GFR 81.68 04/07/2017   CALCIUM 7.6 (L) 06/26/2023   ALBUMIN 2.6 (L) 06/25/2023   PHOS 2.5 06/25/2023     I have reviewed all relevant outside healthcare records related to the patient's kidney injury.

## 2023-06-26 NOTE — Plan of Care (Signed)
Problem: Education: Goal: Knowledge of General Education information will improve Description: Including pain rating scale, medication(s)/side effects and non-pharmacologic comfort measures Outcome: Progressing   Problem: Health Behavior/Discharge Planning: Goal: Ability to manage health-related needs will improve Outcome: Progressing   Problem: Clinical Measurements: Goal: Ability to maintain clinical measurements within normal limits will improve Outcome: Progressing Goal: Will remain free from infection Outcome: Progressing Goal: Diagnostic test results will improve Outcome: Progressing Goal: Respiratory complications will improve Outcome: Progressing Goal: Cardiovascular complication will be avoided Outcome: Progressing   Problem: Activity: Goal: Risk for activity intolerance will decrease Outcome: Progressing   Problem: Nutrition: Goal: Adequate nutrition will be maintained Outcome: Progressing   Problem: Coping: Goal: Level of anxiety will decrease Outcome: Progressing   Problem: Elimination: Goal: Will not experience complications related to bowel motility Outcome: Progressing Goal: Will not experience complications related to urinary retention Outcome: Progressing   Problem: Pain Managment: Goal: General experience of comfort will improve and/or be controlled Outcome: Progressing   Problem: Safety: Goal: Ability to remain free from injury will improve Outcome: Progressing   Problem: Skin Integrity: Goal: Risk for impaired skin integrity will decrease Outcome: Progressing   Problem: Education: Goal: Ability to describe self-care measures that may prevent or decrease complications (Diabetes Survival Skills Education) will improve Outcome: Progressing Goal: Individualized Educational Video(s) Outcome: Progressing   Problem: Coping: Goal: Ability to adjust to condition or change in health will improve Outcome: Progressing   Problem: Fluid  Volume: Goal: Ability to maintain a balanced intake and output will improve Outcome: Progressing   Problem: Health Behavior/Discharge Planning: Goal: Ability to identify and utilize available resources and services will improve Outcome: Progressing Goal: Ability to manage health-related needs will improve Outcome: Progressing   Problem: Metabolic: Goal: Ability to maintain appropriate glucose levels will improve Outcome: Progressing   Problem: Nutritional: Goal: Maintenance of adequate nutrition will improve Outcome: Progressing Goal: Progress toward achieving an optimal weight will improve Outcome: Progressing   Problem: Skin Integrity: Goal: Risk for impaired skin integrity will decrease Outcome: Progressing   Problem: Tissue Perfusion: Goal: Adequacy of tissue perfusion will improve Outcome: Progressing   Problem: Education: Goal: Ability to describe self-care measures that may prevent or decrease complications (Diabetes Survival Skills Education) will improve Outcome: Progressing Goal: Individualized Educational Video(s) Outcome: Progressing   Problem: Cardiac: Goal: Ability to maintain an adequate cardiac output will improve Outcome: Progressing   Problem: Health Behavior/Discharge Planning: Goal: Ability to identify and utilize available resources and services will improve Outcome: Progressing Goal: Ability to manage health-related needs will improve Outcome: Progressing   Problem: Fluid Volume: Goal: Ability to achieve a balanced intake and output will improve Outcome: Progressing   Problem: Metabolic: Goal: Ability to maintain appropriate glucose levels will improve Outcome: Progressing   Problem: Education: Goal: Ability to describe self-care measures that may prevent or decrease complications (Diabetes Survival Skills Education) will improve Outcome: Progressing Goal: Individualized Educational Video(s) Outcome: Progressing   Problem: Coping: Goal:  Ability to adjust to condition or change in health will improve Outcome: Progressing   Problem: Fluid Volume: Goal: Ability to maintain a balanced intake and output will improve Outcome: Progressing   Problem: Health Behavior/Discharge Planning: Goal: Ability to identify and utilize available resources and services will improve Outcome: Progressing Goal: Ability to manage health-related needs will improve Outcome: Progressing   Problem: Metabolic: Goal: Ability to maintain appropriate glucose levels will improve Outcome: Progressing   Problem: Nutritional: Goal: Maintenance of adequate nutrition will improve Outcome: Progressing Goal: Progress toward  achieving an optimal weight will improve Outcome: Progressing   Problem: Skin Integrity: Goal: Risk for impaired skin integrity will decrease Outcome: Progressing   Problem: Tissue Perfusion: Goal: Adequacy of tissue perfusion will improve Outcome: Progressing

## 2023-06-27 DIAGNOSIS — E871 Hypo-osmolality and hyponatremia: Secondary | ICD-10-CM | POA: Diagnosis not present

## 2023-06-27 DIAGNOSIS — E1165 Type 2 diabetes mellitus with hyperglycemia: Secondary | ICD-10-CM | POA: Diagnosis not present

## 2023-06-27 DIAGNOSIS — J9601 Acute respiratory failure with hypoxia: Secondary | ICD-10-CM | POA: Diagnosis not present

## 2023-06-27 DIAGNOSIS — J441 Chronic obstructive pulmonary disease with (acute) exacerbation: Secondary | ICD-10-CM | POA: Diagnosis not present

## 2023-06-27 LAB — GLUCOSE, CAPILLARY
Glucose-Capillary: 219 mg/dL — ABNORMAL HIGH (ref 70–99)
Glucose-Capillary: 264 mg/dL — ABNORMAL HIGH (ref 70–99)
Glucose-Capillary: 94 mg/dL (ref 70–99)
Glucose-Capillary: 188 mg/dL — ABNORMAL HIGH (ref 70–99)

## 2023-06-27 LAB — BASIC METABOLIC PANEL
Anion gap: 11 (ref 5–15)
Anion gap: 13 (ref 5–15)
BUN: 55 mg/dL — ABNORMAL HIGH (ref 8–23)
BUN: 43 mg/dL — ABNORMAL HIGH (ref 8–23)
CO2: 26 mmol/L (ref 22–32)
CO2: 27 mmol/L (ref 22–32)
Calcium: 8.5 mg/dL — ABNORMAL LOW (ref 8.9–10.3)
Calcium: 8.5 mg/dL — ABNORMAL LOW (ref 8.9–10.3)
Chloride: 82 mmol/L — ABNORMAL LOW (ref 98–111)
Chloride: 77 mmol/L — ABNORMAL LOW (ref 98–111)
Creatinine, Ser: 0.75 mg/dL (ref 0.44–1.00)
Creatinine, Ser: 0.83 mg/dL (ref 0.44–1.00)
GFR, Estimated: >60 mL/min (ref >60)
GFR, Estimated: >60 mL/min (ref >60)
Glucose, Bld: 232 mg/dL — ABNORMAL HIGH (ref 70–99)
Glucose, Bld: 212 mg/dL — ABNORMAL HIGH (ref 70–99)
Potassium: 5.6 mmol/L — ABNORMAL HIGH (ref 3.5–5.1)
Potassium: 4.5 mmol/L (ref 3.5–5.1)
Sodium: 119 mmol/L — CL (ref 135–145)
Sodium: 117 mmol/L — CL (ref 135–145)

## 2023-06-27 LAB — SODIUM, URINE, RANDOM: Sodium, Ur: 39 mmol/L

## 2023-06-27 LAB — SODIUM
Sodium: 118 mmol/L — CL (ref 135–145)
Sodium: 117 mmol/L — CL (ref 135–145)
Sodium: 120 mmol/L — ABNORMAL LOW (ref 135–145)
Sodium: 121 mmol/L — ABNORMAL LOW (ref 135–145)

## 2023-06-27 LAB — OSMOLALITY: Osmolality: 276 mosm/kg (ref 275–295)

## 2023-06-27 LAB — OSMOLALITY, URINE: Osmolality, Ur: 420 mosm/kg (ref 300–900)

## 2023-06-27 MED ORDER — BISACODYL 10 MG RE SUPP
10.0000 mg | Freq: Once | RECTAL | Status: AC
  Administered 2023-06-27: 10 mg via RECTAL
  Filled 2023-06-27: qty 1

## 2023-06-27 MED ORDER — IPRATROPIUM-ALBUTEROL 0.5-2.5 (3) MG/3ML IN SOLN
3.0000 mL | Freq: Three times a day (TID) | RESPIRATORY_TRACT | Status: DC
  Administered 2023-06-28 – 2023-07-01 (×9): 3 mL via RESPIRATORY_TRACT
  Filled 2023-06-27 (×12): qty 3

## 2023-06-27 MED ORDER — INSULIN STARTER KIT- PEN NEEDLES (ENGLISH)
1.0000 | Freq: Once | Status: DC
  Filled 2023-06-27: qty 1

## 2023-06-27 MED ORDER — INSULIN GLARGINE-YFGN 100 UNIT/ML ~~LOC~~ SOLN
18.0000 [IU] | Freq: Every day | SUBCUTANEOUS | Status: DC
  Administered 2023-06-27 – 2023-06-29 (×3): 18 [IU] via SUBCUTANEOUS
  Filled 2023-06-27 (×4): qty 0.18

## 2023-06-27 MED ORDER — POLYETHYLENE GLYCOL 3350 17 G PO PACK
17.0000 g | PACK | Freq: Every day | ORAL | Status: DC
  Administered 2023-06-27 – 2023-07-03 (×7): 17 g via ORAL
  Filled 2023-06-27 (×7): qty 1

## 2023-06-27 NOTE — Plan of Care (Signed)
Problem: Education: Goal: Knowledge of General Education information will improve Description: Including pain rating scale, medication(s)/side effects and non-pharmacologic comfort measures Outcome: Progressing   Problem: Health Behavior/Discharge Planning: Goal: Ability to manage health-related needs will improve Outcome: Progressing   Problem: Clinical Measurements: Goal: Ability to maintain clinical measurements within normal limits will improve Outcome: Progressing Goal: Will remain free from infection Outcome: Progressing Goal: Diagnostic test results will improve Outcome: Progressing Goal: Respiratory complications will improve Outcome: Progressing Goal: Cardiovascular complication will be avoided Outcome: Progressing   Problem: Activity: Goal: Risk for activity intolerance will decrease Outcome: Progressing   Problem: Nutrition: Goal: Adequate nutrition will be maintained Outcome: Progressing   Problem: Coping: Goal: Level of anxiety will decrease Outcome: Progressing   Problem: Elimination: Goal: Will not experience complications related to bowel motility Outcome: Progressing Goal: Will not experience complications related to urinary retention Outcome: Progressing   Problem: Pain Managment: Goal: General experience of comfort will improve and/or be controlled Outcome: Progressing   Problem: Safety: Goal: Ability to remain free from injury will improve Outcome: Progressing   Problem: Skin Integrity: Goal: Risk for impaired skin integrity will decrease Outcome: Progressing   Problem: Education: Goal: Ability to describe self-care measures that may prevent or decrease complications (Diabetes Survival Skills Education) will improve Outcome: Progressing Goal: Individualized Educational Video(s) Outcome: Progressing   Problem: Coping: Goal: Ability to adjust to condition or change in health will improve Outcome: Progressing   Problem: Fluid  Volume: Goal: Ability to maintain a balanced intake and output will improve Outcome: Progressing   Problem: Health Behavior/Discharge Planning: Goal: Ability to identify and utilize available resources and services will improve Outcome: Progressing Goal: Ability to manage health-related needs will improve Outcome: Progressing   Problem: Metabolic: Goal: Ability to maintain appropriate glucose levels will improve Outcome: Progressing   Problem: Nutritional: Goal: Maintenance of adequate nutrition will improve Outcome: Progressing Goal: Progress toward achieving an optimal weight will improve Outcome: Progressing   Problem: Skin Integrity: Goal: Risk for impaired skin integrity will decrease Outcome: Progressing   Problem: Tissue Perfusion: Goal: Adequacy of tissue perfusion will improve Outcome: Progressing   Problem: Education: Goal: Ability to describe self-care measures that may prevent or decrease complications (Diabetes Survival Skills Education) will improve Outcome: Progressing Goal: Individualized Educational Video(s) Outcome: Progressing   Problem: Cardiac: Goal: Ability to maintain an adequate cardiac output will improve Outcome: Progressing   Problem: Health Behavior/Discharge Planning: Goal: Ability to identify and utilize available resources and services will improve Outcome: Progressing Goal: Ability to manage health-related needs will improve Outcome: Progressing   Problem: Fluid Volume: Goal: Ability to achieve a balanced intake and output will improve Outcome: Progressing   Problem: Metabolic: Goal: Ability to maintain appropriate glucose levels will improve Outcome: Progressing   Problem: Education: Goal: Ability to describe self-care measures that may prevent or decrease complications (Diabetes Survival Skills Education) will improve Outcome: Progressing Goal: Individualized Educational Video(s) Outcome: Progressing   Problem: Coping: Goal:  Ability to adjust to condition or change in health will improve Outcome: Progressing   Problem: Fluid Volume: Goal: Ability to maintain a balanced intake and output will improve Outcome: Progressing   Problem: Health Behavior/Discharge Planning: Goal: Ability to identify and utilize available resources and services will improve Outcome: Progressing Goal: Ability to manage health-related needs will improve Outcome: Progressing   Problem: Metabolic: Goal: Ability to maintain appropriate glucose levels will improve Outcome: Progressing   Problem: Nutritional: Goal: Maintenance of adequate nutrition will improve Outcome: Progressing Goal: Progress toward  achieving an optimal weight will improve Outcome: Progressing   Problem: Skin Integrity: Goal: Risk for impaired skin integrity will decrease Outcome: Progressing   Problem: Tissue Perfusion: Goal: Adequacy of tissue perfusion will improve Outcome: Progressing

## 2023-06-27 NOTE — TOC Initial Note (Signed)
Transition of Care [facility]) - Initial/Assessment Note    Patient Details  Name: Sara Stafford MRN: [id number] Date of Birth: 1945-11-05  [facility] CM/SW Contact:    Villa Herb, LCSWA Phone Number: 06/27/2023, 3:40 PM  Clinical Narrative:                 Pt is high risk for readmission. CSW met with pt at bedside to complete assessment. Pt states she recently lost her husband and is now living alone. Pt has been independent in completing her ADLs. Pt has family or friends provide transportation. Pt is interested in [facility] being set up, CSW explained that this can be set up but will be short term and for about 30 minutes 1-2 times weekly. Pt states she needs more assistance at home, CSW explained that information can be provided for private paid caregivers. TOC to follow.   Expected Discharge Plan: Home w Home Health Services Barriers to Discharge: Continued Medical Work up   Patient Goals and CMS Choice Patient states their goals for this hospitalization and ongoing recovery are:: return home CMS Medicare.gov Compare Post Acute Care list provided to:: Patient Choice offered to / list presented to : Patient      Expected Discharge Plan and Services In-house Referral: Clinical Social Work Discharge Planning Services: CM Consult Post Acute Care Choice: Home Health Living arrangements for the past 2 months: Single Family Home                                      Prior Living Arrangements/Services Living arrangements for the past 2 months: Single Family Home Lives with:: Self Patient language and need for interpreter reviewed:: Yes Do you feel safe going back to the place where you live?: Yes      Need for Family Participation in Patient Care: Yes (Comment) Care giver support system in place?: Yes (comment)   Criminal Activity/Legal Involvement Pertinent to Current Situation/Hospitalization: No - Comment as needed  Activities of Daily Living   ADL  Screening (condition at time of admission) Independently performs ADLs?: Yes (appropriate for developmental age) Is the patient deaf or have difficulty hearing?: No Does the patient have difficulty seeing, even when wearing glasses/contacts?: No Does the patient have difficulty concentrating, remembering, or making decisions?: No  Permission Sought/Granted                  Emotional Assessment Appearance:: Appears stated age Attitude/Demeanor/Rapport: Engaged Affect (typically observed): Accepting Orientation: : Oriented to Self, Oriented to Place, Oriented to  Time, Oriented to Situation Alcohol / Substance Use: Not Applicable Psych Involvement: No (comment)  Admission diagnosis:  Hyponatremia [E87.1] COPD exacerbation (HCC) [J44.1] Patient Active Problem List   Diagnosis Date Noted   Acute respiratory failure with hypoxia (HCC) 06/25/2023   Elevated troponin 06/25/2023   Elevated brain natriuretic peptide (BNP) level 06/25/2023   Thrombocytosis 06/25/2023   Type 2 diabetes mellitus with hyperglycemia (HCC) 06/25/2023   GERD (gastroesophageal reflux disease) 06/25/2023   Hypocalcemia 06/25/2023   Hypoalbuminemia due to protein-calorie malnutrition (HCC) 06/25/2023   Uncontrolled type 2 diabetes mellitus with hyperglycemia, without long-term current use of insulin (HCC) 06/25/2023   Hyponatremia 04/26/2022   Essential hypertension 06/02/2020   GAD (generalized anxiety disorder) 05/31/2017   Vitamin D deficiency 08/03/2016   Well woman exam 07/29/2016   Osteoporosis 10/05/2015   Macular degeneration 06/02/2014   Abnormal  mammogram 09/10/2009   Pulmonary nodules 03/20/2009   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 [place] [address] Phone: [phone] Fax: [phone]  [id number] [place], [place] - [address] AT [facility] [address] [place] [address] Phone: [phone] Fax: [phone]     Social Drivers of Health (SDOH) Social History: SDOH Screenings   Food Insecurity: No Food Insecurity (06/25/2023)  Housing: Low Risk  (06/25/2023)  Transportation Needs: No Transportation Needs (06/25/2023)  Utilities: Not At Risk (06/25/2023)  Financial Resource Strain: Low Risk  (05/05/2023)   Received from [facility]  Social Connections: Moderately Isolated (06/25/2023)  Tobacco Use: Medium Risk (06/25/2023)   SDOH Interventions:     Readmission Risk Interventions    06/27/2023    3:33 PM  Readmission Risk Prevention Plan  Transportation Screening Complete  HRI or Home Care Consult Complete  Social Work Consult for Recovery Care Planning/Counseling Complete  Palliative Care Screening Not Applicable  Medication Review Oceanographer) Complete

## 2023-06-27 NOTE — Progress Notes (Signed)
Patient ID: Sara Stafford, female   DOB: 1946/04/12, 78 y.o.   MRN: [id number] [facility] Progress Note   Assessment/ Plan:   1.  Hyponatremia: She appears to be hypervolemic on physical exam with true hypoosmolar hyponatremia. This is acute on chronic and likely exacerbated by current admission with COPD exacerbation/acute hypoxic respiratory failure.  Continue 1.2 L fluid restriction/day and restarted back on sodium chloride tablets along with furosemide.  Serum and urine osmolality appear to favor SIADH.  Will discontinue urea supplement at this time as her solute intake appears to be satisfactory.  If sodium level remains <120 over the next 24 hours, will give tolvaptan. 2.  Hyperkalemia: The presence of hypertension points away from adrenal insufficiency.  Supplement discontinued and agree with efforts at medical management; got Lokelma overnight and anticipate to improve further with furosemide. 3.  Acute hypoxic respiratory failure/COPD exacerbation: On bronchodilator therapy/inhaled corticosteroids as well as systemic corticosteroid therapy with methylprednisolone. 4.  Hypertension: On amlodipine and metoprolol, monitor with initiation of intravenous furosemide/volume unloading.  Will attempt to discontinue salt tablets based on trajectory of sodium.  Subjective:   Reports to be feeling well and appears to be alert/oriented responding appropriately to questions.  States that she needs to have a bowel movement.   Objective:   BP (!) 175/81   Pulse 79   Temp 98.4 F (36.9 C) (Oral)   Resp (!) 26   Ht 5\' 5"  (1.651 m)   Wt 59.4 kg   SpO2 97%   BMI 21.79 kg/m   Intake/Output Summary (Last 24 hours) at 06/27/2023 0817 Last data filed at 06/27/2023 0557 Gross per 24 hour  Intake 120 ml  Output 2350 ml  Net -2230 ml   Weight change: 0.1 kg  Physical Exam: Gen: Resting comfortably in bed, RN at bedside administering medications CVS: Pulse regular rhythm, normal  rate, S1 and S2 normal Resp: Intermittent expiratory rhonchi audible without obvious wheeze Abd: Soft, flat, nontender, bowel sounds normal Ext: Trace-1+ lower extremity edema trace upper extremity edema  Imaging: US Venous Img Lower Bilateral (DVT) Result Date: 06/25/2023 CLINICAL DATA:  Chest pain and lower extremity edema EXAM: BILATERAL LOWER EXTREMITY VENOUS DOPPLER ULTRASOUND TECHNIQUE: Gray-scale sonography with graded compression, as well as color Doppler and duplex ultrasound were performed to evaluate the lower extremity deep venous systems from the level of the common femoral vein and including the common femoral, femoral, profunda femoral, popliteal and calf veins including the posterior tibial, peroneal and gastrocnemius veins when visible. The superficial great saphenous vein was also interrogated. Spectral Doppler was utilized to evaluate flow at rest and with distal augmentation maneuvers in the common femoral, femoral and popliteal veins. COMPARISON:  None Available. FINDINGS: RIGHT LOWER EXTREMITY Common Femoral Vein: No evidence of thrombus. Normal compressibility, respiratory phasicity and response to augmentation. Saphenofemoral Junction: No evidence of thrombus. Normal compressibility and flow on color Doppler imaging. Profunda Femoral Vein: No evidence of thrombus. Normal compressibility and flow on color Doppler imaging. Femoral Vein: No evidence of thrombus. Normal compressibility, respiratory phasicity and response to augmentation. Popliteal Vein: No evidence of thrombus. Normal compressibility, respiratory phasicity and response to augmentation. Calf Veins: No evidence of thrombus. Normal compressibility and flow on color Doppler imaging. Superficial Great Saphenous Vein: No evidence of thrombus. Normal compressibility. Venous Reflux:  None. Other Findings:  None. LEFT LOWER EXTREMITY Common Femoral Vein: No evidence of thrombus. Normal compressibility, respiratory phasicity and  response to augmentation. Saphenofemoral Junction: No evidence of thrombus. Normal  compressibility and flow on color Doppler imaging. Profunda Femoral Vein: No evidence of thrombus. Normal compressibility and flow on color Doppler imaging. Femoral Vein: No evidence of thrombus. Normal compressibility, respiratory phasicity and response to augmentation. Popliteal Vein: No evidence of thrombus. Normal compressibility, respiratory phasicity and response to augmentation. Calf Veins: No evidence of thrombus. Normal compressibility and flow on color Doppler imaging. Superficial Great Saphenous Vein: No evidence of thrombus. Normal compressibility. Venous Reflux:  None. Other Findings:  None. IMPRESSION: No evidence of deep venous thrombosis in either lower extremity. Electronically Signed   By: Malachy Moan M.D.   On: 06/25/2023 11:31   CT Angio Chest Pulmonary Embolism (PE) W or WO Contrast Result Date: 06/25/2023 CLINICAL DATA:  PE suspected, positive D-dimer, shortness of breath EXAM: CT ANGIOGRAPHY CHEST WITH CONTRAST TECHNIQUE: Multidetector CT imaging of the chest was performed using the standard protocol during bolus administration of intravenous contrast. Multiplanar CT image reconstructions and MIPs were obtained to evaluate the vascular anatomy. RADIATION DOSE REDUCTION: This exam was performed according to the departmental dose-optimization program which includes automated exposure control, adjustment of the mA and/or kV according to patient size and/or use of iterative reconstruction technique. CONTRAST:  75mL OMNIPAQUE IOHEXOL 350 MG/ML SOLN COMPARISON:  05/20/2022 FINDINGS: Cardiovascular: Satisfactory opacification of the pulmonary arteries to the segmental level. No evidence of pulmonary embolism. Normal heart size. Left coronary artery calcifications. No pericardial effusion. Aortic atherosclerosis. Mediastinum/Nodes: No enlarged mediastinal, hilar, or axillary lymph nodes. Thyroid gland, trachea,  and esophagus demonstrate no significant findings. Lungs/Pleura: Small right, trace left pleural effusions. Mild centrilobular emphysema. Diffuse bilateral bronchial wall thickening. Upper Abdomen: No acute abnormality.  Status post cholecystectomy. Musculoskeletal: No chest wall abnormality. No acute osseous findings. Review of the MIP images confirms the above findings. IMPRESSION: 1. Negative examination for pulmonary embolism. 2. Small right, trace left pleural effusions. 3. Mild emphysema and diffuse bilateral bronchial wall thickening. 4. Coronary artery disease. Aortic Atherosclerosis (ICD10-I70.0). Electronically Signed   By: Jearld Lesch M.D.   On: 06/25/2023 11:22   ECHOCARDIOGRAM COMPLETE Result Date: 06/25/2023    ECHOCARDIOGRAM REPORT   Patient Name:   GENE COLEE Date of Exam: 06/25/2023 Medical Rec #:  [id number]            Height:       65.0 in Accession #:    [id number]           Weight:       130.5 lb Date of Birth:  1945/08/07            BSA:          1.650 m Patient Age:    77 years             BP:           188/98 mmHg Patient Gender: F                    HR:           106 bpm. Exam Location:  Inpatient Procedure: 2D Echo, Cardiac Doppler, Color Doppler and Intracardiac            Opacification Agent Indications:    CHF Acute Systolic  History:        Patient has prior history of Echocardiogram examinations and                 Patient has no prior history of Echocardiogram examinations.  Risk Factors:Hypertension, Diabetes, Dyslipidemia and Former                 Smoker.  Sonographer:    Karma Ganja Referring Phys: [id number] OLADAPO ADEFESO  Sonographer Comments: Technically challenging study due to limited acoustic windows. Image acquisition challenging due to patient body habitus. IMPRESSIONS  1. Left ventricular ejection fraction, by estimation, is 65 to 70%. The left ventricle has normal function. The left ventricle has no regional wall motion abnormalities. Left  ventricular diastolic parameters are consistent with Grade I diastolic dysfunction (impaired relaxation). Elevated left atrial pressure.  2. Right ventricular systolic function is normal. The right ventricular size is normal.  3. The mitral valve is normal in structure. Trivial mitral valve regurgitation. No evidence of mitral stenosis.  4. The aortic valve was not well visualized. Aortic valve regurgitation is not visualized. No aortic stenosis is present.  5. The inferior vena cava is normal in size with greater than 50% respiratory variability, suggesting right atrial pressure of 3 mmHg. Comparison(s): No prior Echocardiogram. FINDINGS  Left Ventricle: Left ventricular ejection fraction, by estimation, is 65 to 70%. The left ventricle has normal function. The left ventricle has no regional wall motion abnormalities. Definity contrast agent was given IV to delineate the left ventricular  endocardial borders. The left ventricular internal cavity size was normal in size. There is no left ventricular hypertrophy. Left ventricular diastolic parameters are consistent with Grade I diastolic dysfunction (impaired relaxation). Elevated left atrial pressure. Right Ventricle: The right ventricular size is normal. Right ventricular systolic function is normal. Left Atrium: Left atrial size was normal in size. Right Atrium: Right atrial size was normal in size. Pericardium: There is no evidence of pericardial effusion. Mitral Valve: The mitral valve is normal in structure. Trivial mitral valve regurgitation. No evidence of mitral valve stenosis. Tricuspid Valve: The tricuspid valve is normal in structure. Tricuspid valve regurgitation is not demonstrated. No evidence of tricuspid stenosis. Aortic Valve: The aortic valve was not well visualized. Aortic valve regurgitation is not visualized. No aortic stenosis is present. Aortic valve mean gradient measures 4.0 mmHg. Aortic valve peak gradient measures 7.4 mmHg. Pulmonic Valve:  The pulmonic valve was not well visualized. Pulmonic valve regurgitation is not visualized. No evidence of pulmonic stenosis. Aorta: The aortic root is normal in size and structure. Venous: The inferior vena cava is normal in size with greater than 50% respiratory variability, suggesting right atrial pressure of 3 mmHg. IAS/Shunts: No atrial level shunt detected by color flow Doppler.  LEFT VENTRICLE PLAX 2D LVIDd:         4.50 cm     Diastology LVIDs:         3.50 cm     LV e' medial:    6.42 cm/s LV PW:         0.70 cm     LV E/e' medial:  17.0 LV IVS:        0.80 cm     LV e' lateral:   10.10 cm/s LVOT diam:     1.80 cm     LV E/e' lateral: 10.8 LV SV:         59 LV SV Index:   36 LVOT Area:     2.54 cm  LV Volumes (MOD) LV vol d, MOD A2C: 39.1 ml LV vol d, MOD A4C: 50.3 ml LV vol s, MOD A2C: 13.0 ml LV vol s, MOD A4C: 23.9 ml LV SV MOD A2C:     26.1  ml LV SV MOD A4C:     50.3 ml LV SV MOD BP:      28.2 ml RIGHT VENTRICLE            IVC RV S prime:     7.40 cm/s  IVC diam: 2.10 cm LEFT ATRIUM             Index        RIGHT ATRIUM          Index LA diam:        3.30 cm 2.00 cm/m   RA Area:     9.49 cm LA Vol (A2C):   43.9 ml 26.61 ml/m  RA Volume:   20.80 ml 12.61 ml/m LA Vol (A4C):   33.6 ml 20.36 ml/m LA Biplane Vol: 38.6 ml 23.39 ml/m  AORTIC VALVE AV Area (Vmax):    2.34 cm AV Area (Vmean):   2.12 cm AV Area (VTI):     2.50 cm AV Vmax:           136.00 cm/s AV Vmean:          90.500 cm/s AV VTI:            0.236 m AV Peak Grad:      7.4 mmHg AV Mean Grad:      4.0 mmHg LVOT Vmax:         125.00 cm/s LVOT Vmean:        75.500 cm/s LVOT VTI:          0.232 m LVOT/AV VTI ratio: 0.98  AORTA Ao Root diam: 2.60 cm MITRAL VALVE MV Area (PHT): 7.82 cm     SHUNTS MV Decel Time: 97 msec      Systemic VTI:  0.23 m MR Peak grad: 132.7 mmHg    Systemic Diam: 1.80 cm MR Vmax:      576.00 cm/s MV E velocity: 109.00 cm/s MV A velocity: 117.00 cm/s MV E/A ratio:  0.93 Olga Millers MD Electronically signed by Olga Millers MD Signature Date/Time: 06/25/2023/11:20:12 AM    Final     Labs: BMET Recent Labs  Lab 06/25/23 8295 06/25/23 6213 06/25/23 2046 06/26/23 0115 06/26/23 0424 06/26/23 0900 06/26/23 1243 06/26/23 1703 06/26/23 2105 06/27/23 0130 06/27/23 0430  NA 115*   < > 116* 117* 118* 114* 115* 117* 116* 118* 119*  K 3.7   < > 4.5 4.9 5.1 4.2 5.6* 6.0*  --   --  5.6*  CL 81*   < > 81* 80* 80* 77* 77* 78*  --   --  82*  CO2 18*   < > 23 26 27 25 27 28   --   --  26  GLUCOSE 380*   < > 306* 236* 272* 337* 222* 185*  --   --  232*  BUN 15   < > 21 21 21 22  24* 50*  --   --  55*  CREATININE 0.66   < > 0.76 0.75 0.71 0.78 0.87 0.86  --   --  0.75  CALCIUM 6.8*   < > 7.6* 7.5* 7.6* 7.5* 8.3* 8.6*  --   --  8.5*  PHOS 2.5  --   --   --   --   --   --   --   --   --   --    < > = values in this interval not displayed.   CBC Recent Labs  Lab 06/24/23 2223 06/25/23  0548  WBC 12.2* 9.2  NEUTROABS 10.6*  --   HGB 10.3* 9.9*  HCT 28.7* 28.6*  MCV 81.8 82.2  PLT 467* 417*    Medications:     acetylcysteine  2 mL Nebulization BID   ALPRAZolam  0.25 mg Oral BID   amLODipine  10 mg Oral Daily   arformoterol  15 mcg Nebulization BID   budesonide (PULMICORT) nebulizer solution  0.5 mg Nebulization BID   calcium carbonate  1 tablet Oral Q breakfast   Chlorhexidine Gluconate Cloth  6 each Topical Daily   dextromethorphan-guaiFENesin  1 tablet Oral BID   enoxaparin (LOVENOX) injection  40 mg Subcutaneous Q24H   feeding supplement (GLUCERNA SHAKE)  237 mL Oral TID BM   furosemide  40 mg Intravenous Daily   insulin aspart  0-20 Units Subcutaneous TID WC   insulin aspart  0-5 Units Subcutaneous QHS   insulin aspart  5 Units Subcutaneous TID WC   insulin glargine-yfgn  15 Units Subcutaneous Daily   ipratropium-albuterol  3 mL Nebulization Q4H   methylPREDNISolone (SOLU-MEDROL) injection  40 mg Intravenous Q12H   metoprolol tartrate  25 mg Oral BID   pantoprazole  40 mg Oral Daily    rosuvastatin  20 mg Oral QHS   sodium chloride  1 g Oral BID WC    Zetta Bills, MD 06/27/2023, 8:17 AM

## 2023-06-27 NOTE — Progress Notes (Signed)
PROGRESS NOTE  Sara Stafford NFA:[id number] DOB: [birth year] DOA: 06/24/2023 PCP: Danella Penton, MD  Brief History:  78 year old female with a history of diabetes mellitus type 2, hypertension, hyperlipidemia, anxiety/depression, COPD, tobacco abuse in remission presenting with 3-week history of shortness of breath, wheezing, coughing, and chest congestion.  The patient went to see her PCP on 06/20/2023.  The patient received IM injection of Kenalog.  She was given a prescription for azithromycin and cefdinir.  She finished azithromycin.  She quit taking the cefdinir after 3 doses because she felt like it was making her symptoms worse.  She denied any fevers, chills, headache, chest pain, abdominal pain.  She has some nausea and posttussive emesis.  She had some loose stools without any hematochezia or melena.  She denies any abdominal pain, dysuria, hematuria.  She denies any hemoptysis. Unfortunately, her shortness of breath continue to worsen.  She also noted some lower extremity edema.  As result, she presented for further evaluation and treatment.  She was noted to have oxygen saturation 95% on room air by EMS.  Notably, the patient states that she has been taking sodium chloride twice daily for the past year because of low sodium.  In addition, she states that she has not taken Effexor for over 1 year.  She has only taken 3 doses of Paxil. She had a hospital admission from 07/10/2022 to 07/11/2022 secondary to hyponatremia.  She was started on sodium chloride during this hospitalization.  This was continued until the time of this admission. The patient also was hospitalized from 04/25/2022 to 04/28/2022 for hyponatremia.  She improved with normal saline and stopping her Effexor.  In the ED, the patient was afebrile hemodynamically stable with oxygen saturation 98% on 2 L. WBC 12.2, hemoglobin 10.3, platelets 467.  Sodium 115, potassium 3.6, bicarbonate 22, serum creatinine 0.50.   LFTs were unremarkable.  Chest x-ray showed increased interstitial markings.  The patient was started on normal saline, bronchodilators, and Solu-Medrol.  COVID-19 PCR is negative.  TSH 1.326.  BNP 567.  Troponin 50>> 42.      Assessment/Plan: Acute respiratory failure with hypoxia -Multifactorial including COPD exacerbation and fluid overload -Stable on 2 L nasal cannula>>1L -Wean oxygen as tolerated for saturation greater 92% -Obtain VBG 7.37/43/36/24   COPD exacerbation -COVID-19 PCR negative -Check viral respiratory panel+rhino/enterovirus -MRSA screen negative -Check PCT 0.14 -Continue DuoNebs -Started Brovana -Started Pulmicort -Patient has approx 30-pack-year history of tobacco;, quit 20 years ago -continue IV solumedrol   Fluid overload -Echo EF 65-70%, no WMA, G1DD, normal RVF -continue IV furosemide -Daily weights -Accurate I's and O's -obtain ReDS =19   Increased troponin -Secondary to demand ischemia -no Chest pain presently -Personally reviewed EKG--sinus rhythm, nonspecific T wave change -Echo EF 65-70%, no WMA, G1DD, normal RVF   Hyponatremia -multifactorial including fluid overload, SIADH, meds, hyperglycemia -She appeared clinically fluid overloaded initially -Continue Check sodium every 4 hours -continue Lasix IV and reassess -Urine osmolarity--412 -Serum osmolarity--259 -Urine sodium 25 -appreciate nephrology consult>>add Urea BID>>stopped 1/28 -restart NaCl 06/27/23 per renal;  tolvaptan if no improvement in Na on 1/29   Anxiety/depression -Continue home dose alprazolam -PDMP reviewed--alprazolam, 0.25 mg, #120, last refill 05/30/2023   Uncontrolled diabetes mellitus type 2 with hyperglycemia -06/25/23 A1C-9.5 -pt was on insulin drip short time 1/26 -Use resistant sliding scale -Holding metformin, Januvia and glipizide -add novolog 5 units with meals -increased semglee to 18 units -patient has macular degeneration  and lives alone--she is  adamant she does NOT want to try insulin pens when she goes home   Essential hypertension -Continue amlodipine and metoprolol   Mixed hyperlipidemia -Continue statin   Leg pain/edema -venous duplex--neg   Hypomagnesemia -replete    Family Communication:   no Family at bedside  Consultants:  renal  Code Status:  FULL   DVT Prophylaxis:  S [place] Lovenox   Procedures: As Listed in Progress Note Above  Antibiotics: None      Subjective: Pt states her breathing is improving.  Denies cp, f/c, n/v/d, abd pain.  Objective: Vitals:   06/27/23 1500 06/27/23 1502 06/27/23 1600 06/27/23 1622  BP: (!) 150/76  138/70   Pulse: 89 91 85   Resp: 20 20 19    Temp:    98.3 F (36.8 C)  TempSrc:    Oral  SpO2: 100% 94% 100%   Weight:      Height:        Intake/Output Summary (Last 24 hours) at 06/27/2023 1755 Last data filed at 06/27/2023 1700 Gross per 24 hour  Intake 840 ml  Output 2300 ml  Net -1460 ml   Weight change: 0.1 kg Exam:  General:  Pt is alert, follows commands appropriately, not in acute distress HEENT: No icterus, No thrush, No neck mass, /AT Cardiovascular: RRR, S1/S2, no rubs, no gallops Respiratory: diminished BS.  Bibasilar rales. Minimal basilar wheeze Abdomen: Soft/+BS, non tender, non distended, no guarding Extremities: No edema, No lymphangitis, No petechiae, No rashes, no synovitis   Data Reviewed: I have personally reviewed following labs and imaging studies Basic Metabolic Panel: Recent Labs  Lab 06/25/23 0548 06/25/23 0947 06/26/23 0424 06/26/23 0900 06/26/23 1243 06/26/23 1703 06/26/23 2105 06/27/23 0130 06/27/23 0430 06/27/23 0916 06/27/23 1249  NA 115*   < > 118* 114* 115* 117* 116* 118* 119* 117* 117*  K 3.7   < > 5.1 4.2 5.6* 6.0*  --   --  5.6*  --  4.5  CL 81*   < > 80* 77* 77* 78*  --   --  82*  --  77*  CO2 18*   < > 27 25 27 28   --   --  26  --  27  GLUCOSE 380*   < > 272* 337* 222* 185*  --   --  232*  --  212*   BUN 15   < > 21 22 24* 50*  --   --  55*  --  43*  CREATININE 0.66   < > 0.71 0.78 0.87 0.86  --   --  0.75  --  0.83  CALCIUM 6.8*   < > 7.6* 7.5* 8.3* 8.6*  --   --  8.5*  --  8.5*  MG 0.8*  --  1.3*  --   --   --   --   --   --   --   --   PHOS 2.5  --   --   --   --   --   --   --   --   --   --    < > = values in this interval not displayed.   Liver Function Tests: Recent Labs  Lab 06/24/23 2223 06/25/23 0548 06/26/23 1243  AST 21 19 30   ALT 21 21 24   ALKPHOS 96 89 91  BILITOT 0.4 0.9 0.5  PROT 6.1* 6.1* 6.8  ALBUMIN 2.7* 2.6* 3.1*   No results  for input(s): "LIPASE", "AMYLASE" in the last 168 hours. No results for input(s): "AMMONIA" in the last 168 hours. Coagulation Profile: No results for input(s): "INR", "PROTIME" in the last 168 hours. CBC: Recent Labs  Lab 06/24/23 2223 06/25/23 0548  WBC 12.2* 9.2  NEUTROABS 10.6*  --   HGB 10.3* 9.9*  HCT 28.7* 28.6*  MCV 81.8 82.2  PLT 467* 417*   Cardiac Enzymes: No results for input(s): "CKTOTAL", "CKMB", "CKMBINDEX", "TROPONINI" in the last 168 hours. BNP: Invalid input(s): "POCBNP" CBG: Recent Labs  Lab 06/26/23 1558 06/26/23 2100 06/27/23 0736 06/27/23 1114 06/27/23 1627  GLUCAP 188* 205* 219* 264* 94   HbA1C: Recent Labs    06/25/23 0548  HGBA1C 9.5*   Urine analysis:    Component Value Date/Time   COLORURINE YELLOW (A) 04/25/2022 1857   APPEARANCEUR HAZY (A) 04/25/2022 1857   APPEARANCEUR Hazy (A) 01/26/2021 1309   LABSPEC 1.011 04/25/2022 1857   PHURINE 5.0 04/25/2022 1857   GLUCOSEU NEGATIVE 04/25/2022 1857   GLUCOSEU NEGATIVE 02/29/2012 1116   HGBUR NEGATIVE 04/25/2022 1857   HGBUR large 01/01/2010 1204   BILIRUBINUR NEGATIVE 04/25/2022 1857   BILIRUBINUR Negative 01/26/2021 1309   KETONESUR NEGATIVE 04/25/2022 1857   PROTEINUR NEGATIVE 04/25/2022 1857   UROBILINOGEN 0.2 02/29/2012 1116   NITRITE NEGATIVE 04/25/2022 1857   LEUKOCYTESUR NEGATIVE 04/25/2022 1857   Sepsis  Labs: @LABRCNTIP (procalcitonin:4,lacticidven:4) ) Recent Results (from the past 240 hours)  Resp panel by RT-PCR (RSV, Flu A&B, Covid) Anterior Nasal Swab     Status: None   Collection Time: 06/24/23 10:24 PM   Specimen: Anterior Nasal Swab  Result Value Ref Range Status   SARS Coronavirus 2 by RT PCR NEGATIVE NEGATIVE Final    Comment: (NOTE) SARS-CoV-2 target nucleic acids are NOT DETECTED.  The SARS-CoV-2 RNA is generally detectable in upper respiratory specimens during the acute phase of infection. The lowest concentration of SARS-CoV-2 viral copies this assay can detect is 138 copies/mL. A negative result does not preclude SARS-Cov-2 infection and should not be used as the sole basis for treatment or other patient management decisions. A negative result may occur with  improper specimen collection/handling, submission of specimen other than nasopharyngeal swab, presence of viral mutation(s) within the areas targeted by this assay, and inadequate number of viral copies(<138 copies/mL). A negative result must be combined with clinical observations, patient history, and epidemiological information. The expected result is Negative.  Fact Sheet for Patients:  BloggerCourse.com  Fact Sheet for Healthcare Providers:  SeriousBroker.it  This test is no t yet approved or cleared by the [place] FDA and  has been authorized for detection and/or diagnosis of SARS-CoV-2 by FDA under an Emergency Use Authorization (EUA). This EUA will remain  in effect (meaning this test can be used) for the duration of the COVID-19 declaration under Section 564(b)(1) of the Act, 21 U.S.C.section 360bbb-3(b)(1), unless the authorization is terminated  or revoked sooner.       Influenza A by PCR NEGATIVE NEGATIVE Final   Influenza B by PCR NEGATIVE NEGATIVE Final    Comment: (NOTE) The Xpert Xpress SARS-CoV-2/FLU/RSV plus assay is intended as an  aid in the diagnosis of influenza from Nasopharyngeal swab specimens and should not be used as a sole basis for treatment. Nasal washings and aspirates are unacceptable for Xpert Xpress SARS-CoV-2/FLU/RSV testing.  Fact Sheet for Patients: BloggerCourse.com  Fact Sheet for Healthcare Providers: SeriousBroker.it  This test is not yet approved or cleared by the [place] and  has been authorized for detection and/or diagnosis of SARS-CoV-2 by FDA under an Emergency Use Authorization (EUA). This EUA will remain in effect (meaning this test can be used) for the duration of the COVID-19 declaration under Section 564(b)(1) of the Act, 21 U.S.C. section 360bbb-3(b)(1), unless the authorization is terminated or revoked.     Resp Syncytial Virus by PCR NEGATIVE NEGATIVE Final    Comment: (NOTE) Fact Sheet for Patients: BloggerCourse.com  Fact Sheet for Healthcare Providers: SeriousBroker.it  This test is not yet approved or cleared by the [place] FDA and has been authorized for detection and/or diagnosis of SARS-CoV-2 by FDA under an Emergency Use Authorization (EUA). This EUA will remain in effect (meaning this test can be used) for the duration of the COVID-19 declaration under Section 564(b)(1) of the Act, 21 U.S.C. section 360bbb-3(b)(1), unless the authorization is terminated or revoked.  Performed at [facility], [address], [place], [place] [address]   MRSA Next Gen by PCR, Nasal     Status: None   Collection Time: 06/25/23 12:57 AM   Specimen: Nasal Mucosa; Nasal Swab  Result Value Ref Range Status   MRSA by PCR Next Gen NOT DETECTED NOT DETECTED Final    Comment: (NOTE) The GeneXpert MRSA Assay (FDA approved for NASAL specimens only), is one component of a comprehensive MRSA colonization surveillance program. It is not intended to diagnose MRSA infection  nor to guide or monitor treatment for MRSA infections. Test performance is not FDA approved in patients less than 42 years old. Performed at [facility], [address] [place], [place] [address]   Respiratory (~20 pathogens) panel by PCR     Status: Abnormal   Collection Time: 06/25/23  8:18 AM   Specimen: Nasopharyngeal Swab; Respiratory  Result Value Ref Range Status   Adenovirus NOT DETECTED NOT DETECTED Final   Coronavirus 229E NOT DETECTED NOT DETECTED Final    Comment: (NOTE) The Coronavirus on the Respiratory Panel, DOES NOT test for the novel  Coronavirus (2019 nCoV)    Coronavirus HKU1 NOT DETECTED NOT DETECTED Final   Coronavirus NL63 NOT DETECTED NOT DETECTED Final   Coronavirus OC43 NOT DETECTED NOT DETECTED Final   Metapneumovirus NOT DETECTED NOT DETECTED Final   Rhinovirus / Enterovirus DETECTED (A) NOT DETECTED Final   Influenza A NOT DETECTED NOT DETECTED Final   Influenza B NOT DETECTED NOT DETECTED Final   Parainfluenza Virus 1 NOT DETECTED NOT DETECTED Final   Parainfluenza Virus 2 NOT DETECTED NOT DETECTED Final   Parainfluenza Virus 3 NOT DETECTED NOT DETECTED Final   Parainfluenza Virus 4 NOT DETECTED NOT DETECTED Final   Respiratory Syncytial Virus NOT DETECTED NOT DETECTED Final   Bordetella pertussis NOT DETECTED NOT DETECTED Final   Bordetella Parapertussis NOT DETECTED NOT DETECTED Final   Chlamydophila pneumoniae NOT DETECTED NOT DETECTED Final   Mycoplasma pneumoniae NOT DETECTED NOT DETECTED Final    Comment: Performed at [facility], [address] [place], [place] [address]     Scheduled Meds:  acetylcysteine  2 mL Nebulization BID   ALPRAZolam  0.25 mg Oral BID   amLODipine  10 mg Oral Daily   arformoterol  15 mcg Nebulization BID   budesonide (PULMICORT) nebulizer solution  0.5 mg Nebulization BID   calcium carbonate  1 tablet Oral Q breakfast   Chlorhexidine Gluconate Cloth  6 each Topical Daily   dextromethorphan-guaiFENesin  1  tablet Oral BID   enoxaparin (LOVENOX) injection  40 mg Subcutaneous  Q24H   feeding supplement (GLUCERNA SHAKE)  237 mL Oral TID BM   furosemide  40 mg Intravenous Daily   insulin aspart  0-20 Units Subcutaneous TID WC   insulin aspart  0-5 Units Subcutaneous QHS   insulin aspart  5 Units Subcutaneous TID WC   insulin glargine-yfgn  18 Units Subcutaneous Daily   insulin starter kit- pen needles  1 kit Other Once   ipratropium-albuterol  3 mL Nebulization Q4H   methylPREDNISolone (SOLU-MEDROL) injection  40 mg Intravenous Q12H   metoprolol tartrate  25 mg Oral BID   pantoprazole  40 mg Oral Daily   rosuvastatin  20 mg Oral QHS   sodium chloride  1 g Oral BID WC   Continuous Infusions:  insulin Stopped (06/25/23 1604)    Procedures/Studies: US Venous Img Lower Bilateral (DVT) Result Date: 06/25/2023 CLINICAL DATA:  Chest pain and lower extremity edema EXAM: BILATERAL LOWER EXTREMITY VENOUS DOPPLER ULTRASOUND TECHNIQUE: Gray-scale sonography with graded compression, as well as color Doppler and duplex ultrasound were performed to evaluate the lower extremity deep venous systems from the level of the common femoral vein and including the common femoral, femoral, profunda femoral, popliteal and calf veins including the posterior tibial, peroneal and gastrocnemius veins when visible. The superficial great saphenous vein was also interrogated. Spectral Doppler was utilized to evaluate flow at rest and with distal augmentation maneuvers in the common femoral, femoral and popliteal veins. COMPARISON:  None Available. FINDINGS: RIGHT LOWER EXTREMITY Common Femoral Vein: No evidence of thrombus. Normal compressibility, respiratory phasicity and response to augmentation. Saphenofemoral Junction: No evidence of thrombus. Normal compressibility and flow on color Doppler imaging. Profunda Femoral Vein: No evidence of thrombus. Normal compressibility and flow on color Doppler imaging. Femoral Vein: No evidence  of thrombus. Normal compressibility, respiratory phasicity and response to augmentation. Popliteal Vein: No evidence of thrombus. Normal compressibility, respiratory phasicity and response to augmentation. Calf Veins: No evidence of thrombus. Normal compressibility and flow on color Doppler imaging. Superficial Great Saphenous Vein: No evidence of thrombus. Normal compressibility. Venous Reflux:  None. Other Findings:  None. LEFT LOWER EXTREMITY Common Femoral Vein: No evidence of thrombus. Normal compressibility, respiratory phasicity and response to augmentation. Saphenofemoral Junction: No evidence of thrombus. Normal compressibility and flow on color Doppler imaging. Profunda Femoral Vein: No evidence of thrombus. Normal compressibility and flow on color Doppler imaging. Femoral Vein: No evidence of thrombus. Normal compressibility, respiratory phasicity and response to augmentation. Popliteal Vein: No evidence of thrombus. Normal compressibility, respiratory phasicity and response to augmentation. Calf Veins: No evidence of thrombus. Normal compressibility and flow on color Doppler imaging. Superficial Great Saphenous Vein: No evidence of thrombus. Normal compressibility. Venous Reflux:  None. Other Findings:  None. IMPRESSION: No evidence of deep venous thrombosis in either lower extremity. Electronically Signed   By: Malachy Moan M.D.   On: 06/25/2023 11:31   CT Angio Chest Pulmonary Embolism (PE) W or WO Contrast Result Date: 06/25/2023 CLINICAL DATA:  PE suspected, positive D-dimer, shortness of breath EXAM: CT ANGIOGRAPHY CHEST WITH CONTRAST TECHNIQUE: Multidetector CT imaging of the chest was performed using the standard protocol during bolus administration of intravenous contrast. Multiplanar CT image reconstructions and MIPs were obtained to evaluate the vascular anatomy. RADIATION DOSE REDUCTION: This exam was performed according to the departmental dose-optimization program which includes  automated exposure control, adjustment of the mA and/or kV according to patient size and/or use of iterative reconstruction technique. CONTRAST:  75mL OMNIPAQUE IOHEXOL 350 MG/ML SOLN COMPARISON:  05/20/2022  FINDINGS: Cardiovascular: Satisfactory opacification of the pulmonary arteries to the segmental level. No evidence of pulmonary embolism. Normal heart size. Left coronary artery calcifications. No pericardial effusion. Aortic atherosclerosis. Mediastinum/Nodes: No enlarged mediastinal, hilar, or axillary lymph nodes. Thyroid gland, trachea, and esophagus demonstrate no significant findings. Lungs/Pleura: Small right, trace left pleural effusions. Mild centrilobular emphysema. Diffuse bilateral bronchial wall thickening. Upper Abdomen: No acute abnormality.  Status post cholecystectomy. Musculoskeletal: No chest wall abnormality. No acute osseous findings. Review of the MIP images confirms the above findings. IMPRESSION: 1. Negative examination for pulmonary embolism. 2. Small right, trace left pleural effusions. 3. Mild emphysema and diffuse bilateral bronchial wall thickening. 4. Coronary artery disease. Aortic Atherosclerosis (ICD10-I70.0). Electronically Signed   By: Jearld Lesch M.D.   On: 06/25/2023 11:22   ECHOCARDIOGRAM COMPLETE Result Date: 06/25/2023    ECHOCARDIOGRAM REPORT   Patient Name:   Sara Stafford Date of Exam: 06/25/2023 Medical Rec #:  [id number]            Height:       65.0 in Accession #:    [id number]           Weight:       130.5 lb Date of Birth:  [birth year]            BSA:          1.650 m Patient Age:    77 years             BP:           188/98 mmHg Patient Gender: F                    HR:           106 bpm. Exam Location:  Inpatient Procedure: 2D Echo, Cardiac Doppler, Color Doppler and Intracardiac            Opacification Agent Indications:    CHF Acute Systolic  History:        Patient has prior history of Echocardiogram examinations and                 Patient has no  prior history of Echocardiogram examinations.                 Risk Factors:Hypertension, Diabetes, Dyslipidemia and Former                 Smoker.  Sonographer:    Karma Ganja Referring Phys: [id number] OLADAPO ADEFESO  Sonographer Comments: Technically challenging study due to limited acoustic windows. Image acquisition challenging due to patient body habitus. IMPRESSIONS  1. Left ventricular ejection fraction, by estimation, is 65 to 70%. The left ventricle has normal function. The left ventricle has no regional wall motion abnormalities. Left ventricular diastolic parameters are consistent with Grade I diastolic dysfunction (impaired relaxation). Elevated left atrial pressure.  2. Right ventricular systolic function is normal. The right ventricular size is normal.  3. The mitral valve is normal in structure. Trivial mitral valve regurgitation. No evidence of mitral stenosis.  4. The aortic valve was not well visualized. Aortic valve regurgitation is not visualized. No aortic stenosis is present.  5. The inferior vena cava is normal in size with greater than 50% respiratory variability, suggesting right atrial pressure of 3 mmHg. Comparison(s): No prior Echocardiogram. FINDINGS  Left Ventricle: Left ventricular ejection fraction, by estimation, is 65 to 70%. The left ventricle has normal function. The left ventricle has no regional wall motion  abnormalities. Definity contrast agent was given IV to delineate the left ventricular  endocardial borders. The left ventricular internal cavity size was normal in size. There is no left ventricular hypertrophy. Left ventricular diastolic parameters are consistent with Grade I diastolic dysfunction (impaired relaxation). Elevated left atrial pressure. Right Ventricle: The right ventricular size is normal. Right ventricular systolic function is normal. Left Atrium: Left atrial size was normal in size. Right Atrium: Right atrial size was normal in size. Pericardium: There is no  evidence of pericardial effusion. Mitral Valve: The mitral valve is normal in structure. Trivial mitral valve regurgitation. No evidence of mitral valve stenosis. Tricuspid Valve: The tricuspid valve is normal in structure. Tricuspid valve regurgitation is not demonstrated. No evidence of tricuspid stenosis. Aortic Valve: The aortic valve was not well visualized. Aortic valve regurgitation is not visualized. No aortic stenosis is present. Aortic valve mean gradient measures 4.0 mmHg. Aortic valve peak gradient measures 7.4 mmHg. Pulmonic Valve: The pulmonic valve was not well visualized. Pulmonic valve regurgitation is not visualized. No evidence of pulmonic stenosis. Aorta: The aortic root is normal in size and structure. Venous: The inferior vena cava is normal in size with greater than 50% respiratory variability, suggesting right atrial pressure of 3 mmHg. IAS/Shunts: No atrial level shunt detected by color flow Doppler.  LEFT VENTRICLE PLAX 2D LVIDd:         4.50 cm     Diastology LVIDs:         3.50 cm     LV e' medial:    6.42 cm/s LV PW:         0.70 cm     LV E/e' medial:  17.0 LV IVS:        0.80 cm     LV e' lateral:   10.10 cm/s LVOT diam:     1.80 cm     LV E/e' lateral: 10.8 LV SV:         59 LV SV Index:   36 LVOT Area:     2.54 cm  LV Volumes (MOD) LV vol d, MOD A2C: 39.1 ml LV vol d, MOD A4C: 50.3 ml LV vol s, MOD A2C: 13.0 ml LV vol s, MOD A4C: 23.9 ml LV SV MOD A2C:     26.1 ml LV SV MOD A4C:     50.3 ml LV SV MOD BP:      28.2 ml RIGHT VENTRICLE            IVC RV S prime:     7.40 cm/s  IVC diam: 2.10 cm LEFT ATRIUM             Index        RIGHT ATRIUM          Index LA diam:        3.30 cm 2.00 cm/m   RA Area:     9.49 cm LA Vol (A2C):   43.9 ml 26.61 ml/m  RA Volume:   20.80 ml 12.61 ml/m LA Vol (A4C):   33.6 ml 20.36 ml/m LA Biplane Vol: 38.6 ml 23.39 ml/m  AORTIC VALVE AV Area (Vmax):    2.34 cm AV Area (Vmean):   2.12 cm AV Area (VTI):     2.50 cm AV Vmax:           136.00 cm/s AV  Vmean:          90.500 cm/s AV VTI:  0.236 m AV Peak Grad:      7.4 mmHg AV Mean Grad:      4.0 mmHg LVOT Vmax:         125.00 cm/s LVOT Vmean:        75.500 cm/s LVOT VTI:          0.232 m LVOT/AV VTI ratio: 0.98  AORTA Ao Root diam: 2.60 cm MITRAL VALVE MV Area (PHT): 7.82 cm     SHUNTS MV Decel Time: 97 msec      Systemic VTI:  0.23 m MR Peak grad: 132.7 mmHg    Systemic Diam: 1.80 cm MR Vmax:      576.00 cm/s MV E velocity: 109.00 cm/s MV A velocity: 117.00 cm/s MV E/A ratio:  0.93 Olga Millers MD Electronically signed by Olga Millers MD Signature Date/Time: 06/25/2023/11:20:12 AM    Final    DG Chest 2 View Result Date: 06/24/2023 CLINICAL DATA:  Cough and dyspnea EXAM: CHEST - 2 VIEW COMPARISON:  None Available. FINDINGS: There are increased central interstitial markings bilaterally. There is no lung consolidation, pleural effusion or pneumothorax. The cardiomediastinal silhouette is within normal limits. No acute fractures are seen. IMPRESSION: Increased central interstitial markings bilaterally may reflect atypical/viral infection. No lung consolidation. Electronically Signed   By: Darliss Cheney M.D.   On: 06/24/2023 22:58    Catarina Hartshorn, DO  [facility]  If 7PM-7AM, please contact night-coverage [email or web address] Password [facility] 06/27/2023, 5:55 PM   LOS: 3 days

## 2023-06-27 NOTE — Inpatient Diabetes Management (Signed)
Inpatient Diabetes Program Recommendations  AACE/ADA: New Consensus Statement on Inpatient Glycemic Control (2015)  Target Ranges:  Prepandial:   less than 140 mg/dL      Peak postprandial:   less than 180 mg/dL (1-2 hours)      Critically ill patients:  140 - 180 mg/dL   Lab Results  Component Value Date   GLUCAP 264 (H) 06/27/2023   HGBA1C 9.5 (H) 06/25/2023    Review of Glycemic Control  Latest Reference Range & Units 06/26/23 15:58 06/26/23 21:00 06/27/23 07:36 06/27/23 11:14  Glucose-Capillary 70 - 99 mg/dL 161 (H) 096 (H) 045 (H) 264 (H)  (H): Data is abnormally high Diabetes history: Type 2 DM Outpatient Diabetes medications: Glipizide 10 mg BID, Januvia 50 mg every day, Metformin 750 mg QPM Current orders for Inpatient glycemic control: Novolog 5 units TID, Semglee 18 units every day, Novolog 0-20 units TID & HS Solumedrol 40 mg QD  Inpatient Diabetes Program Recommendations:    Spoke with patient regarding outpatient diabetes management. Patient verified home medications and denies missing doses. Earlier in the month contacted PCP regarding increasing oral medication doses.  Reviewed patient's current A1c of 9.5%. Explained what a A1c is and what it measures. Also reviewed goal A1c with patient, importance of good glucose control @ home, and blood sugar goals. Reviewed patho of DM, need for improved control, glucose trends, survival skills, interventions, vascular changes and commorbidities.  Patient has a meter and testing supplies. Reports checking CBGs once in the morning and that the blood sugars have been more elevated since her husband has been sick.  Reviewed recommended frequency and when to contact PCP.  Denies drinking sugary beverages. Encouraged continued mindfulness and protein.  Secure chat sent to MD to discuss potential need for insulin outpatient. Secure chat sent to RN to begin teaching insulin. Per RN and patient, due to macular degeneration patient is unable  and unwilling to perform injections.  Did suggest the possibility of pre drawn syringes with the help of family member of [facility] RN. Patient was not interested.  To follow up with PCP.   Thanks, Lujean Rave, MSN, RNC-OB Diabetes Coordinator [phone] (8a-5p)

## 2023-06-27 NOTE — Progress Notes (Signed)
   06/27/23 0800  ReDS Vest / Clip  Station Marker B  Ruler Value 39  ReDS Value Range < 36  ReDS Actual Value 17

## 2023-06-27 NOTE — Progress Notes (Signed)
Date and time results received: 06/27/23 09:48 (use smartphrase ".now" to insert current time)  Test: Sodium Critical Value: 117  Name of Provider Notified: Dr.Tat  Orders Received? Or Actions Taken?:  MD notified via secure chat. Awaiting orders

## 2023-06-28 ENCOUNTER — Inpatient Hospital Stay (HOSPITAL_COMMUNITY): Payer: Medicare Other

## 2023-06-28 DIAGNOSIS — E871 Hypo-osmolality and hyponatremia: Secondary | ICD-10-CM | POA: Diagnosis not present

## 2023-06-28 LAB — RENAL FUNCTION PANEL
Albumin: 3 g/dL — ABNORMAL LOW (ref 3.5–5.0)
Anion gap: 11 (ref 5–15)
BUN: 33 mg/dL — ABNORMAL HIGH (ref 8–23)
CO2: 31 mmol/L (ref 22–32)
Calcium: 8.5 mg/dL — ABNORMAL LOW (ref 8.9–10.3)
Chloride: 77 mmol/L — ABNORMAL LOW (ref 98–111)
Creatinine, Ser: 0.73 mg/dL (ref 0.44–1.00)
GFR, Estimated: >60 mL/min (ref >60)
Glucose, Bld: 267 mg/dL — ABNORMAL HIGH (ref 70–99)
Phosphorus: 3.3 mg/dL (ref 2.5–4.6)
Potassium: 5 mmol/L (ref 3.5–5.1)
Sodium: 119 mmol/L — CL (ref 135–145)

## 2023-06-28 LAB — BASIC METABOLIC PANEL
Anion gap: 11 (ref 5–15)
BUN: 31 mg/dL — ABNORMAL HIGH (ref 8–23)
CO2: 30 mmol/L (ref 22–32)
Calcium: 8.4 mg/dL — ABNORMAL LOW (ref 8.9–10.3)
Chloride: 78 mmol/L — ABNORMAL LOW (ref 98–111)
Creatinine, Ser: 0.73 mg/dL (ref 0.44–1.00)
GFR, Estimated: >60 mL/min (ref >60)
Glucose, Bld: 246 mg/dL — ABNORMAL HIGH (ref 70–99)
Potassium: 4.7 mmol/L (ref 3.5–5.1)
Sodium: 119 mmol/L — CL (ref 135–145)

## 2023-06-28 LAB — GLUCOSE, CAPILLARY
Glucose-Capillary: 228 mg/dL — ABNORMAL HIGH (ref 70–99)
Glucose-Capillary: 262 mg/dL — ABNORMAL HIGH (ref 70–99)
Glucose-Capillary: 120 mg/dL — ABNORMAL HIGH (ref 70–99)
Glucose-Capillary: 152 mg/dL — ABNORMAL HIGH (ref 70–99)

## 2023-06-28 LAB — HEPATIC FUNCTION PANEL
ALT: 24 U/L (ref 0–44)
AST: 23 U/L (ref 15–41)
Albumin: 3 g/dL — ABNORMAL LOW (ref 3.5–5.0)
Alkaline Phosphatase: 87 U/L (ref 38–126)
Bilirubin, Direct: 0.1 mg/dL (ref 0.0–0.2)
Indirect Bilirubin: 0.5 mg/dL (ref 0.3–0.9)
Total Bilirubin: 0.6 mg/dL (ref 0.0–1.2)
Total Protein: 6.4 g/dL — ABNORMAL LOW (ref 6.5–8.1)

## 2023-06-28 LAB — SODIUM
Sodium: 120 mmol/L — ABNORMAL LOW (ref 135–145)
Sodium: 122 mmol/L — ABNORMAL LOW (ref 135–145)
Sodium: 121 mmol/L — ABNORMAL LOW (ref 135–145)

## 2023-06-28 MED ORDER — TOLVAPTAN 15 MG PO TABS
15.0000 mg | ORAL_TABLET | Freq: Once | ORAL | Status: AC
Start: 2023-06-28 — End: 2023-06-28
  Administered 2023-06-28: 15 mg via ORAL
  Filled 2023-06-28: qty 1

## 2023-06-28 MED ORDER — ACETYLCYSTEINE 20 % IN SOLN: 4.0000 mL | Freq: Two times a day (BID) | RESPIRATORY_TRACT | Status: DC

## 2023-06-28 MED ORDER — METOPROLOL TARTRATE 50 MG PO TABS
50.0000 mg | ORAL_TABLET | Freq: Two times a day (BID) | ORAL | Status: DC
Start: 2023-06-28 — End: 2023-07-03
  Administered 2023-06-28 – 2023-07-03 (×11): 50 mg via ORAL
  Filled 2023-06-28 (×11): qty 1

## 2023-06-28 MED ORDER — METHYLPREDNISOLONE SODIUM SUCC 40 MG IJ SOLR
40.0000 mg | INTRAMUSCULAR | Status: DC
  Administered 2023-06-28: 40 mg via INTRAVENOUS
  Filled 2023-06-28: qty 1

## 2023-06-28 MED ORDER — ACETYLCYSTEINE 20 % IN SOLN
4.0000 mL | Freq: Two times a day (BID) | RESPIRATORY_TRACT | Status: DC
  Administered 2023-06-28 – 2023-06-30 (×5): 4 mL via RESPIRATORY_TRACT
  Filled 2023-06-28 (×5): qty 4

## 2023-06-28 NOTE — Progress Notes (Signed)
PROGRESS NOTE  Sara Stafford ZOX:[id number] DOB: [birth year] DOA: 06/24/2023 PCP: Danella Penton, MD  Brief History:  78 year old female with a history of diabetes mellitus type 2, hypertension, hyperlipidemia, anxiety/depression, COPD, tobacco abuse in remission presenting with 3-week history of shortness of breath, wheezing, coughing, and chest congestion.  The patient went to see her PCP on 06/20/2023.  The patient received IM injection of Kenalog.  She was given a prescription for azithromycin and cefdinir.  She finished azithromycin.  She quit taking the cefdinir after 3 doses because she felt like it was making her symptoms worse.  She denied any fevers, chills, headache, chest pain, abdominal pain.  She has some nausea and posttussive emesis.  She had some loose stools without any hematochezia or melena.  She denies any abdominal pain, dysuria, hematuria.  She denies any hemoptysis. Unfortunately, her shortness of breath continue to worsen.  She also noted some lower extremity edema.  As result, she presented for further evaluation and treatment.  She was noted to have oxygen saturation 95% on room air by EMS.  Notably, the patient states that she has been taking sodium chloride twice daily for the past year because of low sodium.  In addition, she states that she has not taken Effexor for over 1 year.  She has only taken 3 doses of Paxil. She had a hospital admission from 07/10/2022 to 07/11/2022 secondary to hyponatremia.  She was started on sodium chloride during this hospitalization.  This was continued until the time of this admission. The patient also was hospitalized from 04/25/2022 to 04/28/2022 for hyponatremia.  She improved with normal saline and stopping her Effexor.  In the ED, the patient was afebrile hemodynamically stable with oxygen saturation 98% on 2 L. WBC 12.2, hemoglobin 10.3, platelets 467.  Sodium 115, potassium 3.6, bicarbonate 22, serum creatinine 0.50.   LFTs were unremarkable.  Chest x-ray showed increased interstitial markings.  The patient was started on normal saline, bronchodilators, and Solu-Medrol.  COVID-19 PCR is negative.  TSH 1.326.  BNP 567.  Troponin 50>> 42.      Subjective: The patient was seen and examined, complaining of generalized weaknesses Needing assist with all ADLs, remains awake alert oriented, denies any pain Hemodynamically stable Sodium this morning 119 Elevated blood pressure 184/76, Satting 100% on room air  Assessment/Plan:  Acute respiratory failure with hypoxia -Multifactorial including COPD exacerbation and fluid overload -Weaned down to room air, currently satting 100% -Keeping O2 saturation > 92% -Obtain VBG 7.37/43/36/24   COPD exacerbation -COVID-19 PCR negative -Check viral respiratory panel+rhino/enterovirus -MRSA screen negative -Check PCT 0.14 -Continue DuoNebs -Started Brovana -Started Pulmicort -Patient has approx 30-pack-year history of tobacco;, quit 20 years ago   Hyponatremia -multifactorial including fluid overload, SIADH, meds, hypovolemia with true hypoosmolar hyponatremia  -She appears clinically fluid overloaded -Continue Check sodium every 4 hours -Give Lasix IV and reassess -Urine osmolarity--412 -Serum osmolarity--259 -Urine sodium 25 -06/25/23 Na = 115, but serum glucose 380 -appreciate nephrology consult>>add Urea BID -Nephrologist following, Continue furosemide, adding Tolvaptan and today 1/28      Fluid overload -Echo EF 65-70%, no WMA, G1DD, normal RVF -Start IV furosemide >>> switching to p.o. -Daily weights -Accurate I's and O's -obtain ReDS =19   Increased troponin -Secondary to demand ischemia -Chest pain presently -Personally reviewed EKG--sinus rhythm, nonspecific T wave change -Echo EF 65-70%, no WMA, G1DD, normal RVF      Anxiety/depression -Continue home dose alprazolam -  PDMP reviewed--alprazolam, 0.25 mg, #120, last refill  05/30/2023   Uncontrolled diabetes mellitus type 2 with hyperglycemia -06/25/23 A1C-9.5 -pt was on insulin drip short time 1/26 -Use resistant sliding scale -Holding metformin and glipizide -add novolog 5 units with meals   Essential hypertension -Continue amlodipine and metoprolol   Mixed hyperlipidemia -Continue statin   Leg pain/edema -venous duplex--neg   Hypomagnesemia -replete       Family Communication:   no Family at bedside   Consultants:  none   Code Status:  FULL    DVT Prophylaxis:  [place] Lovenox     Disposition: Anticipating SNF in 1-2 days     procedures: As Listed in Progress Note Above  Antibiotics: None  Procedures: As Listed in Progress Note Above Antibiotics: None     Objective: Vitals:   06/28/23 0700 06/28/23 0726 06/28/23 0801 06/28/23 0932  BP: (!) 178/81  (!) 199/78 (!) 184/76  Pulse: 80 85  98  Resp: 19 19    Temp:  97.9 F (36.6 C)    TempSrc:  Axillary    SpO2: 94% 100%    Weight:      Height:        Intake/Output Summary (Last 24 hours) at 06/28/2023 1033 Last data filed at 06/28/2023 0919 Gross per 24 hour  Intake 1200 ml  Output 2150 ml  Net -950 ml   Weight change: -1.9 kg      General:  AAO x 3,  cooperative, no distress;   HEENT:  Normocephalic, PERRL, otherwise with in Normal limits   Neuro:  CNII-XII intact. , normal motor and sensation, reflexes intact   Lungs:   Clear to auscultation BL, Respirations unlabored,  No wheezes / crackles  Cardio:    S1/S2, RRR, No murmure, No Rubs or Gallops   Abdomen:  Soft, non-tender, bowel sounds active all four quadrants, no guarding or peritoneal signs.  Muscular  skeletal:  Limited exam -global generalized weaknesses - in bed, able to move all 4 extremities,   2+ pulses,  symmetric, No pitting edema  Skin:  Dry, warm to touch, negative for any Rashes,  Wounds: Please see nursing documentation          Data Reviewed: I have personally reviewed following  labs and imaging studies Basic Metabolic Panel: Recent Labs  Lab 06/25/23 0548 06/25/23 0947 06/26/23 0424 06/26/23 0900 06/26/23 1703 06/26/23 2105 06/27/23 0430 06/27/23 0916 06/27/23 1249 06/27/23 1807 06/27/23 2148 06/28/23 0206 06/28/23 0604 06/28/23 0739  NA 115*   < > 118*   < > 117*   < > 119*   < > 117* 120* 121* 120* 119* 119*  K 3.7   < > 5.1   < > 6.0*  --  5.6*  --  4.5  --   --   --  5.0 4.7  CL 81*   < > 80*   < > 78*  --  82*  --  77*  --   --   --  77* 78*  CO2 18*   < > 27   < > 28  --  26  --  27  --   --   --  31 30  GLUCOSE 380*   < > 272*   < > 185*  --  232*  --  212*  --   --   --  267* 246*  BUN 15   < > 21   < > 50*  --  55*  --  43*  --   --   --  33* 31*  CREATININE 0.66   < > 0.71   < > 0.86  --  0.75  --  0.83  --   --   --  0.73 0.73  CALCIUM 6.8*   < > 7.6*   < > 8.6*  --  8.5*  --  8.5*  --   --   --  8.5* 8.4*  MG 0.8*  --  1.3*  --   --   --   --   --   --   --   --   --   --   --   PHOS 2.5  --   --   --   --   --   --   --   --   --   --   --  3.3  --    < > = values in this interval not displayed.   Liver Function Tests: Recent Labs  Lab 06/24/23 2223 06/25/23 0548 06/26/23 1243 06/28/23 0604 06/28/23 0739  AST 21 19 30   --  23  ALT 21 21 24   --  24  ALKPHOS 96 89 91  --  87  BILITOT 0.4 0.9 0.5  --  0.6  PROT 6.1* 6.1* 6.8  --  6.4*  ALBUMIN 2.7* 2.6* 3.1* 3.0* 3.0*   No results for input(s): "LIPASE", "AMYLASE" in the last 168 hours. No results for input(s): "AMMONIA" in the last 168 hours. Coagulation Profile: No results for input(s): "INR", "PROTIME" in the last 168 hours. CBC: Recent Labs  Lab 06/24/23 2223 06/25/23 0548  WBC 12.2* 9.2  NEUTROABS 10.6*  --   HGB 10.3* 9.9*  HCT 28.7* 28.6*  MCV 81.8 82.2  PLT 467* 417*   Cardiac Enzymes: No results for input(s): "CKTOTAL", "CKMB", "CKMBINDEX", "TROPONINI" in the last 168 hours. BNP: Invalid input(s): "POCBNP" CBG: Recent Labs  Lab 06/27/23 0736  06/27/23 1114 06/27/23 1627 06/27/23 2150 06/28/23 0729  GLUCAP 219* 264* 94 188* 228*   HbA1C: No results for input(s): "HGBA1C" in the last 72 hours.  Urine analysis:    Component Value Date/Time   COLORURINE YELLOW (A) 04/25/2022 1857   APPEARANCEUR HAZY (A) 04/25/2022 1857   APPEARANCEUR Hazy (A) 01/26/2021 1309   LABSPEC 1.011 04/25/2022 1857   PHURINE 5.0 04/25/2022 1857   GLUCOSEU NEGATIVE 04/25/2022 1857   GLUCOSEU NEGATIVE 02/29/2012 1116   HGBUR NEGATIVE 04/25/2022 1857   HGBUR large 01/01/2010 1204   BILIRUBINUR NEGATIVE 04/25/2022 1857   BILIRUBINUR Negative 01/26/2021 1309   KETONESUR NEGATIVE 04/25/2022 1857   PROTEINUR NEGATIVE 04/25/2022 1857   UROBILINOGEN 0.2 02/29/2012 1116   NITRITE NEGATIVE 04/25/2022 1857   LEUKOCYTESUR NEGATIVE 04/25/2022 1857   Sepsis Labs: Reviewed @LABRCNTIP (procalcitonin:4,lacticidven:4) COVID/viral panel all negative  Scheduled Meds:  acetylcysteine  4 mL Nebulization BID   ALPRAZolam  0.25 mg Oral BID   amLODipine  10 mg Oral Daily   arformoterol  15 mcg Nebulization BID   budesonide (PULMICORT) nebulizer solution  0.5 mg Nebulization BID   calcium carbonate  1 tablet Oral Q breakfast   Chlorhexidine Gluconate Cloth  6 each Topical Daily   dextromethorphan-guaiFENesin  1 tablet Oral BID   feeding supplement (GLUCERNA SHAKE)  237 mL Oral TID BM   furosemide  40 mg Intravenous Daily   insulin aspart  0-20 Units Subcutaneous TID WC   insulin aspart  0-5 Units Subcutaneous QHS   insulin aspart  5 Units Subcutaneous TID WC   insulin glargine-yfgn  18 Units Subcutaneous Daily   insulin starter kit- pen needles  1 kit Other Once   ipratropium-albuterol  3 mL Nebulization TID   methylPREDNISolone (SOLU-MEDROL) injection  40 mg Intravenous Q24H   metoprolol tartrate  50 mg Oral BID   polyethylene glycol  17 g Oral Daily   rosuvastatin  20 mg Oral QHS   sodium chloride  1 g Oral BID WC   Continuous Infusions:  insulin  Stopped (06/25/23 1604)    Procedures/Studies: US Venous Img Lower Bilateral (DVT) Result Date: 06/25/2023 CLINICAL DATA:  Chest pain and lower extremity edema EXAM: BILATERAL LOWER EXTREMITY VENOUS DOPPLER ULTRASOUND TECHNIQUE: Gray-scale sonography with graded compression, as well as color Doppler and duplex ultrasound were performed to evaluate the lower extremity deep venous systems from the level of the common femoral vein and including the common femoral, femoral, profunda femoral, popliteal and calf veins including the posterior tibial, peroneal and gastrocnemius veins when visible. The superficial great saphenous vein was also interrogated. Spectral Doppler was utilized to evaluate flow at rest and with distal augmentation maneuvers in the common femoral, femoral and popliteal veins. COMPARISON:  None Available. FINDINGS: RIGHT LOWER EXTREMITY Common Femoral Vein: No evidence of thrombus. Normal compressibility, respiratory phasicity and response to augmentation. Saphenofemoral Junction: No evidence of thrombus. Normal compressibility and flow on color Doppler imaging. Profunda Femoral Vein: No evidence of thrombus. Normal compressibility and flow on color Doppler imaging. Femoral Vein: No evidence of thrombus. Normal compressibility, respiratory phasicity and response to augmentation. Popliteal Vein: No evidence of thrombus. Normal compressibility, respiratory phasicity and response to augmentation. Calf Veins: No evidence of thrombus. Normal compressibility and flow on color Doppler imaging. Superficial Great Saphenous Vein: No evidence of thrombus. Normal compressibility. Venous Reflux:  None. Other Findings:  None. LEFT LOWER EXTREMITY Common Femoral Vein: No evidence of thrombus. Normal compressibility, respiratory phasicity and response to augmentation. Saphenofemoral Junction: No evidence of thrombus. Normal compressibility and flow on color Doppler imaging. Profunda Femoral Vein: No evidence of  thrombus. Normal compressibility and flow on color Doppler imaging. Femoral Vein: No evidence of thrombus. Normal compressibility, respiratory phasicity and response to augmentation. Popliteal Vein: No evidence of thrombus. Normal compressibility, respiratory phasicity and response to augmentation. Calf Veins: No evidence of thrombus. Normal compressibility and flow on color Doppler imaging. Superficial Great Saphenous Vein: No evidence of thrombus. Normal compressibility. Venous Reflux:  None. Other Findings:  None. IMPRESSION: No evidence of deep venous thrombosis in either lower extremity. Electronically Signed   By: Malachy Moan M.D.   On: 06/25/2023 11:31   CT Angio Chest Pulmonary Embolism (PE) W or WO Contrast Result Date: 06/25/2023 CLINICAL DATA:  PE suspected, positive D-dimer, shortness of breath EXAM: CT ANGIOGRAPHY CHEST WITH CONTRAST TECHNIQUE: Multidetector CT imaging of the chest was performed using the standard protocol during bolus administration of intravenous contrast. Multiplanar CT image reconstructions and MIPs were obtained to evaluate the vascular anatomy. RADIATION DOSE REDUCTION: This exam was performed according to the departmental dose-optimization program which includes automated exposure control, adjustment of the mA and/or kV according to patient size and/or use of iterative reconstruction technique. CONTRAST:  75mL OMNIPAQUE IOHEXOL 350 MG/ML SOLN COMPARISON:  05/20/2022 FINDINGS: Cardiovascular: Satisfactory opacification of the pulmonary arteries to the segmental level. No evidence of pulmonary embolism. Normal heart size. Left coronary artery calcifications. No pericardial effusion. Aortic atherosclerosis. Mediastinum/Nodes: No enlarged mediastinal, hilar, or axillary lymph nodes. Thyroid gland, trachea, and esophagus demonstrate  no significant findings. Lungs/Pleura: Small right, trace left pleural effusions. Mild centrilobular emphysema. Diffuse bilateral bronchial wall  thickening. Upper Abdomen: No acute abnormality.  Status post cholecystectomy. Musculoskeletal: No chest wall abnormality. No acute osseous findings. Review of the MIP images confirms the above findings. IMPRESSION: 1. Negative examination for pulmonary embolism. 2. Small right, trace left pleural effusions. 3. Mild emphysema and diffuse bilateral bronchial wall thickening. 4. Coronary artery disease. Aortic Atherosclerosis (ICD10-I70.0). Electronically Signed   By: Jearld Lesch M.D.   On: 06/25/2023 11:22   ECHOCARDIOGRAM COMPLETE Result Date: 06/25/2023    ECHOCARDIOGRAM REPORT   Patient Name:   AYNSLEY FLEET Date of Exam: 06/25/2023 Medical Rec #:  [id number]            Height:       65.0 in Accession #:    [id number]           Weight:       130.5 lb Date of Birth:  [birth year]            BSA:          1.650 m Patient Age:    77 years             BP:           188/98 mmHg Patient Gender: F                    HR:           106 bpm. Exam Location:  Inpatient Procedure: 2D Echo, Cardiac Doppler, Color Doppler and Intracardiac            Opacification Agent Indications:    CHF Acute Systolic  History:        Patient has prior history of Echocardiogram examinations and                 Patient has no prior history of Echocardiogram examinations.                 Risk Factors:Hypertension, Diabetes, Dyslipidemia and Former                 Smoker.  Sonographer:    Karma Ganja Referring Phys: [id number] OLADAPO ADEFESO  Sonographer Comments: Technically challenging study due to limited acoustic windows. Image acquisition challenging due to patient body habitus. IMPRESSIONS  1. Left ventricular ejection fraction, by estimation, is 65 to 70%. The left ventricle has normal function. The left ventricle has no regional wall motion abnormalities. Left ventricular diastolic parameters are consistent with Grade I diastolic dysfunction (impaired relaxation). Elevated left atrial pressure.  2. Right ventricular systolic  function is normal. The right ventricular size is normal.  3. The mitral valve is normal in structure. Trivial mitral valve regurgitation. No evidence of mitral stenosis.  4. The aortic valve was not well visualized. Aortic valve regurgitation is not visualized. No aortic stenosis is present.  5. The inferior vena cava is normal in size with greater than 50% respiratory variability, suggesting right atrial pressure of 3 mmHg. Comparison(s): No prior Echocardiogram. FINDINGS  Left Ventricle: Left ventricular ejection fraction, by estimation, is 65 to 70%. The left ventricle has normal function. The left ventricle has no regional wall motion abnormalities. Definity contrast agent was given IV to delineate the left ventricular  endocardial borders. The left ventricular internal cavity size was normal in size. There is no left ventricular hypertrophy. Left ventricular diastolic parameters are consistent with Grade I diastolic dysfunction (impaired  relaxation). Elevated left atrial pressure. Right Ventricle: The right ventricular size is normal. Right ventricular systolic function is normal. Left Atrium: Left atrial size was normal in size. Right Atrium: Right atrial size was normal in size. Pericardium: There is no evidence of pericardial effusion. Mitral Valve: The mitral valve is normal in structure. Trivial mitral valve regurgitation. No evidence of mitral valve stenosis. Tricuspid Valve: The tricuspid valve is normal in structure. Tricuspid valve regurgitation is not demonstrated. No evidence of tricuspid stenosis. Aortic Valve: The aortic valve was not well visualized. Aortic valve regurgitation is not visualized. No aortic stenosis is present. Aortic valve mean gradient measures 4.0 mmHg. Aortic valve peak gradient measures 7.4 mmHg. Pulmonic Valve: The pulmonic valve was not well visualized. Pulmonic valve regurgitation is not visualized. No evidence of pulmonic stenosis. Aorta: The aortic root is normal in size  and structure. Venous: The inferior vena cava is normal in size with greater than 50% respiratory variability, suggesting right atrial pressure of 3 mmHg. IAS/Shunts: No atrial level shunt detected by color flow Doppler.  LEFT VENTRICLE PLAX 2D LVIDd:         4.50 cm     Diastology LVIDs:         3.50 cm     LV e' medial:    6.42 cm/s LV PW:         0.70 cm     LV E/e' medial:  17.0 LV IVS:        0.80 cm     LV e' lateral:   10.10 cm/s LVOT diam:     1.80 cm     LV E/e' lateral: 10.8 LV SV:         59 LV SV Index:   36 LVOT Area:     2.54 cm  LV Volumes (MOD) LV vol d, MOD A2C: 39.1 ml LV vol d, MOD A4C: 50.3 ml LV vol s, MOD A2C: 13.0 ml LV vol s, MOD A4C: 23.9 ml LV SV MOD A2C:     26.1 ml LV SV MOD A4C:     50.3 ml LV SV MOD BP:      28.2 ml RIGHT VENTRICLE            IVC RV S prime:     7.40 cm/s  IVC diam: 2.10 cm LEFT ATRIUM             Index        RIGHT ATRIUM          Index LA diam:        3.30 cm 2.00 cm/m   RA Area:     9.49 cm LA Vol (A2C):   43.9 ml 26.61 ml/m  RA Volume:   20.80 ml 12.61 ml/m LA Vol (A4C):   33.6 ml 20.36 ml/m LA Biplane Vol: 38.6 ml 23.39 ml/m  AORTIC VALVE AV Area (Vmax):    2.34 cm AV Area (Vmean):   2.12 cm AV Area (VTI):     2.50 cm AV Vmax:           136.00 cm/s AV Vmean:          90.500 cm/s AV VTI:            0.236 m AV Peak Grad:      7.4 mmHg AV Mean Grad:      4.0 mmHg LVOT Vmax:         125.00 cm/s LVOT Vmean:  75.500 cm/s LVOT VTI:          0.232 m LVOT/AV VTI ratio: 0.98  AORTA Ao Root diam: 2.60 cm MITRAL VALVE MV Area (PHT): 7.82 cm     SHUNTS MV Decel Time: 97 msec      Systemic VTI:  0.23 m MR Peak grad: 132.7 mmHg    Systemic Diam: 1.80 cm MR Vmax:      576.00 cm/s MV E velocity: 109.00 cm/s MV A velocity: 117.00 cm/s MV E/A ratio:  0.93 Olga Millers MD Electronically signed by Olga Millers MD Signature Date/Time: 06/25/2023/11:20:12 AM    Final    DG Chest 2 View Result Date: 06/24/2023 CLINICAL DATA:  Cough and dyspnea EXAM: CHEST - 2 VIEW  COMPARISON:  None Available. FINDINGS: There are increased central interstitial markings bilaterally. There is no lung consolidation, pleural effusion or pneumothorax. The cardiomediastinal silhouette is within normal limits. No acute fractures are seen. IMPRESSION: Increased central interstitial markings bilaterally may reflect atypical/viral infection. No lung consolidation. Electronically Signed   By: Darliss Cheney M.D.   On: 06/24/2023 22:58    Kendell Bane, MD  [facility]  Total of 65 minutes of critical time was spent evaluating and examining the patient, reviewing all medical records, current medication discussion with ICU staff and nephrologist.    If 7PM-7AM, please contact night-coverage [email or web address] Password [email or web address] 06/28/2023, 10:33 AM   LOS: 4 days

## 2023-06-28 NOTE — Progress Notes (Signed)
Chaplain responded to a request to provide information about Advance Directives to Pt. Pt received information, although expressed she would not be able to read the form with Chaplain, due to some sight issues. After this information, Chaplain and Pt had a moment to talk about her time in the hospital and how Pt is coping with her health issues. Pt shared with Chaplain about the strength she feels coming from her faith, which sustains her and provide her with the hope she needs to overcome this time. Pt also shared about her sadness for the loss of her husband, last Jan.12. Pt reflected on how these two major situations in life have affected her goals in life. Pt feels she needs to be better and remain strong "for my children and grandchildren". Pt was grateful to have a moment to explore her feelings with Chaplain.  Oneida Alar Chaplain Resident    06/28/23 1510  Spiritual Encounters  Type of Visit Initial  Care provided to: Patient  Referral source Clinical staff  Reason for visit Advance directives  OnCall Visit No  Spiritual Framework  Presenting Themes Meaning/purpose/sources of inspiration;Coping tools;Impactful experiences and emotions  Community/Connection Family  Patient Stress Factors Exhausted  Family Stress Factors None identified

## 2023-06-28 NOTE — Plan of Care (Signed)
  Problem: Acute Rehab PT Goals(only PT should resolve) Goal: Pt Will Go Supine/Side To Sit Flowsheets (Taken 06/28/2023 1222) Pt will go Supine/Side to Sit: Independently Goal: Patient Will Perform Sitting Balance Flowsheets (Taken 06/28/2023 1222) Patient will perform sitting balance: Independently Goal: Patient Will Transfer Sit To/From Stand Flowsheets (Taken 06/28/2023 1222) Patient will transfer sit to/from stand: Independently Goal: Pt Will Transfer Bed To Chair/Chair To Bed Flowsheets (Taken 06/28/2023 1222) Pt will Transfer Bed to Chair/Chair to Bed: Independently Goal: Pt Will Perform Standing Balance Or Pre-Gait Flowsheets (Taken 06/28/2023 1222) Pt will perform standing balance or pre-gait: Independently Goal: Pt Will Ambulate Flowsheets (Taken 06/28/2023 1222) Pt will Ambulate:  50 feet  Independently  with least restrictive assistive device Goal: Pt Will Go Up/Down Stairs Flowsheets (Taken 06/28/2023 1222) Pt will Go Up / Down Stairs:  3-5 stairs  with moderate assist  Nelida Meuse PT, DPT [facility]- [place] [phone] office

## 2023-06-28 NOTE — TOC Progression Note (Signed)
Transition of Care [facility] - Progression Note    Patient Details  Name: Sara Stafford MRN: [id number] Date of Birth: Feb 04, 1946  Transition of Care [facility]) CM/SW Contact  Villa Herb, [place] Phone Number: 06/28/2023, 3:56 PM  Clinical Narrative:    CSW updated that PT is recommending SNF for pt at D/C. CSW met with pt at bedside to review recommendation. Pt states she is agreeable to SNF referral being sent out. Pt prefers SNF at [facility]. Pt states she does not want to go to SNF at [facility]. CSW explained that referral will not be sent to [facility] and [facility] will be marked as first choice. TOC to follow.   Expected Discharge Plan: Home w Home Health Services Barriers to Discharge: Continued Medical Work up  Expected Discharge Plan and Services In-house Referral: Clinical Social Work Discharge Planning Services: CM Consult Post Acute Care Choice: Home Health Living arrangements for the past 2 months: Single Family Home                                       Social Determinants of Health (SDOH) Interventions SDOH Screenings   Food Insecurity: No Food Insecurity (06/25/2023)  Housing: Low Risk  (06/25/2023)  Transportation Needs: No Transportation Needs (06/25/2023)  Utilities: Not At Risk (06/25/2023)  Financial Resource Strain: Low Risk  (05/05/2023)   Received from [facility]  Social Connections: Moderately Isolated (06/25/2023)  Tobacco Use: Medium Risk (06/25/2023)    Readmission Risk Interventions    06/27/2023    3:33 PM  Readmission Risk Prevention Plan  Transportation Screening Complete  HRI or Home Care Consult Complete  Social Work Consult for Recovery Care Planning/Counseling Complete  Palliative Care Screening Not Applicable  Medication Review Oceanographer) Complete

## 2023-06-28 NOTE — [place] FL2 (Signed)
[place] MEDICAID FL2 LEVEL OF CARE FORM     IDENTIFICATION  Patient Name: Sara Stafford Birthdate: [birth year] Sex: female Admission Date (Current Location): 06/24/2023  [facility] and [place] Number:  [employer] and Address:  [facility],  [address] [place] [address]      Provider Number: [id number]  Attending Physician Name and Address:  Kendell Bane, MD  Relative Name and Phone Number:       Current Level of Care: Hospital Recommended Level of Care: Skilled Nursing Facility Prior Approval Number:    Date Approved/Denied:   PASRR Number: [id number]  Discharge Plan: SNF    Current Diagnoses: Patient Active Problem List   Diagnosis Date Noted   Acute respiratory failure with hypoxia (HCC) 06/25/2023   Elevated troponin 06/25/2023   Elevated brain natriuretic peptide (BNP) level 06/25/2023   Thrombocytosis 06/25/2023   Type 2 diabetes mellitus with hyperglycemia (HCC) 06/25/2023   GERD (gastroesophageal reflux disease) 06/25/2023   Hypocalcemia 06/25/2023   Hypoalbuminemia due to protein-calorie malnutrition (HCC) 06/25/2023   Uncontrolled type 2 diabetes mellitus with hyperglycemia, without long-term current use of insulin (HCC) 06/25/2023   Hyponatremia 04/26/2022   Essential hypertension 06/02/2020   GAD (generalized anxiety disorder) 05/31/2017   Vitamin D deficiency 08/03/2016   Well woman exam 07/29/2016   Osteoporosis 10/05/2015   Macular degeneration 06/02/2014   Abnormal mammogram 09/10/2009   Pulmonary nodules 03/20/2009   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) 07/31/2008   Acute exacerbation of chronic obstructive pulmonary disease (COPD) (HCC) 10/11/2007   Diabetes (HCC) 06/04/2007   Mixed hyperlipidemia 06/04/2007   Depression 06/04/2007   Esophagitis 06/04/2007   FIBROMYALGIA 06/04/2007    Orientation RESPIRATION BLADDER Height & Weight     Self, Time, Situation, Place  Normal  Incontinent Weight: 126 lb 12.2 oz (57.5 kg) Height:  5\' 5"  (165.1 cm)  BEHAVIORAL SYMPTOMS/MOOD NEUROLOGICAL BOWEL NUTRITION STATUS      Continent Diet (See D/C summary)  AMBULATORY STATUS COMMUNICATION OF NEEDS Skin   Extensive Assist Verbally Normal                       Personal Care Assistance Level of Assistance  Bathing, Feeding, Dressing Bathing Assistance: Limited assistance Feeding assistance: Independent Dressing Assistance: Limited assistance     Functional Limitations Info  Sight, Hearing, Speech Sight Info: Impaired Hearing Info: Adequate Speech Info: Adequate    SPECIAL CARE FACTORS FREQUENCY  PT (By licensed PT), OT (By licensed OT)     PT Frequency: 5 times weekly OT Frequency: 5 times weekly            Contractures Contractures Info: Not present    Additional Factors Info  Code Status, Allergies Code Status Info: FULL             Current Medications (06/28/2023):  This is the current hospital active medication list Current Facility-Administered Medications  Medication Dose Route Frequency Provider Last Rate Last Admin   acetaminophen (TYLENOL) tablet 650 mg  650 mg Oral Q6H PRN Adefeso, Oladapo, DO       Or   acetaminophen (TYLENOL) suppository 650 mg  650 mg Rectal Q6H PRN Adefeso, Oladapo, DO       acetylcysteine (MUCOMYST) 20 % nebulizer / oral solution 4 mL  4 mL Nebulization BID Shahmehdi, Seyed A, MD       ALPRAZolam Prudy Feeler) tablet 0.25 mg  0.25 mg Oral BID Catarina Hartshorn, MD  0.25 mg at 06/28/23 0802   amLODipine (NORVASC) tablet 10 mg  10 mg Oral Daily Adefeso, Oladapo, DO   10 mg at 06/28/23 0801   arformoterol (BROVANA) nebulizer solution 15 mcg  15 mcg Nebulization BID Tat, Onalee Hua, MD   15 mcg at 06/28/23 0715   budesonide (PULMICORT) nebulizer solution 0.5 mg  0.5 mg Nebulization BID Tat, Onalee Hua, MD   0.5 mg at 06/28/23 1610   calcium carbonate (OS-CAL - dosed in mg of elemental calcium) tablet 1,250 mg  1 tablet Oral Q breakfast  Adefeso, Oladapo, DO   1,250 mg at 06/28/23 0802   Chlorhexidine Gluconate Cloth 2 % PADS 6 each  6 each Topical Daily Adefeso, Oladapo, DO   6 each at 06/28/23 0802   dextromethorphan-guaiFENesin (MUCINEX DM) 30-600 MG per 12 hr tablet 1 tablet  1 tablet Oral BID Adefeso, Oladapo, DO   1 tablet at 06/28/23 0800   dextrose 50 % solution 0-50 mL  0-50 mL Intravenous PRN Tat, Onalee Hua, MD       feeding supplement (GLUCERNA SHAKE) (GLUCERNA SHAKE) liquid 237 mL  237 mL Oral TID BM Adefeso, Oladapo, DO   237 mL at 06/28/23 0803   furosemide (LASIX) injection 40 mg  40 mg Intravenous Daily Tat, Onalee Hua, MD   40 mg at 06/28/23 0800   insulin aspart (novoLOG) injection 0-20 Units  0-20 Units Subcutaneous TID WC Catarina Hartshorn, MD   11 Units at 06/28/23 1202   insulin aspart (novoLOG) injection 0-5 Units  0-5 Units Subcutaneous QHS Catarina Hartshorn, MD   2 Units at 06/26/23 2106   insulin aspart (novoLOG) injection 5 Units  5 Units Subcutaneous TID WC Catarina Hartshorn, MD   5 Units at 06/28/23 1203   insulin glargine-yfgn (SEMGLEE) injection 18 Units  18 Units Subcutaneous Daily Tat, Onalee Hua, MD   18 Units at 06/28/23 9604   insulin regular, human (MYXREDLIN) 100 units/ 100 mL infusion   Intravenous Continuous Tat, David, MD   Stopped at 06/25/23 1604   insulin starter kit- pen needles (English) 1 kit  1 kit Other Once Tat, Onalee Hua, MD       ipratropium (ATROVENT) nebulizer solution 0.5 mg  0.5 mg Nebulization Q4H PRN Adefeso, Oladapo, DO       ipratropium-albuterol (DUONEB) 0.5-2.5 (3) MG/3ML nebulizer solution 3 mL  3 mL Nebulization TID Tat, Onalee Hua, MD   3 mL at 06/28/23 1403   methylPREDNISolone sodium succinate (SOLU-MEDROL) 40 mg/mL injection 40 mg  40 mg Intravenous Q24H Shahmehdi, Seyed A, MD       metoprolol tartrate (LOPRESSOR) tablet 50 mg  50 mg Oral BID Dagoberto Ligas, MD   50 mg at 06/28/23 0932   ondansetron (ZOFRAN) tablet 4 mg  4 mg Oral Q6H PRN Adefeso, Oladapo, DO       Or   ondansetron (ZOFRAN) injection 4 mg  4  mg Intravenous Q6H PRN Adefeso, Oladapo, DO       Oral care mouth rinse  15 mL Mouth Rinse PRN Adefeso, Oladapo, DO       polyethylene glycol (MIRALAX / GLYCOLAX) packet 17 g  17 g Oral Daily Tat, David, MD   17 g at 06/28/23 0802   rosuvastatin (CRESTOR) tablet 20 mg  20 mg Oral QHS Adefeso, Oladapo, DO   20 mg at 06/27/23 2147   sodium chloride tablet 1 g  1 g Oral BID WC Anthony Sar, MD   1 g at 06/28/23 0802     Discharge  Medications: Please see discharge summary for a list of discharge medications.  Relevant Imaging Results:  Relevant Lab Results:   Additional Information Empagliflozin, Ozempic (0.25 Or 0.5 Mg-dose) (Semaglutide(0.25 Or 0.5mg -dos)), Semaglutide, Venlafaxine, Codeine, Doxycycline, Mirtazapine  Villa Herb, LCSWA

## 2023-06-28 NOTE — Evaluation (Signed)
Physical Therapy Evaluation Patient Details Name: Sara Stafford MRN: [id number] DOB: [birth year] Today's Date: 06/28/2023  History of Present Illness  78 year old female with a history of diabetes mellitus type 2, hypertension, hyperlipidemia, anxiety/depression, COPD, tobacco abuse in remission presenting with 3-week history of shortness of breath, wheezing, coughing, and chest congestion.  The patient went to see her PCP on 06/20/2023.  The patient received IM injection of Kenalog.  She was given a prescription for azithromycin and cefdinir.  She finished azithromycin.  She quit taking the cefdinir after 3 doses because she felt like it was making her symptoms worse.  She denied any fevers, chills, headache, chest pain, abdominal pain.  She has some nausea and posttussive emesis.  She had some loose stools without any hematochezia or melena.  She denies any abdominal pain, dysuria, hematuria.  She denies any hemoptysis.  Unfortunately, her shortness of breath continue to worsen.  She also noted some lower extremity edema.  As result, she presented for further evaluation and treatment.  She was noted to have oxygen saturation 95% on room air by EMS.  Clinical Impression   Pt was anxious in today's Physical Therapy Evaluation, with consistent concerns regarding her ankles. Pt reporting just a few days ago she was walking normally with heel-toe pattern. Presently during evaluation pt unable to actively or passive move R ankle through DF ROM. In standing and during ambulation, pt stuck in ankle plantarflexion with no capacity to move into DF with full weight bearing. Due to abnormal bilateral ankle ROM, movement is hard and stiff with hard end feel during passive attempt during mobility. Pt's ankle mobility presents as increased tone throughout gastrocnemius, this was informed to pt's MD after evaluation. Pt demonstrating significant limitations in ambulation, transfers, and ADLs due to muscle weakness,  balance deficits, in setting of abnormal bilateral ankle mobility. Based upon these deficits/impairments, patient will benefit from continued skilled physical therapy services during remainder of hospital stay and at the next recommended venue of care to address deficits and promote return to optimal function.                If plan is discharge home, recommend the following: A lot of help with walking and/or transfers;A lot of help with bathing/dressing/bathroom   Can travel by private vehicle   No    Equipment Recommendations None recommended by PT  Recommendations for Other Services       Functional Status Assessment Patient has had a recent decline in their functional status and demonstrates the ability to make significant improvements in function in a reasonable and predictable amount of time.     Precautions / Restrictions Precautions Precautions: Fall Restrictions Weight Bearing Restrictions Per Provider Order: No      Mobility  Bed Mobility Overal bed mobility: Modified Independent             General bed mobility comments: from flat HOB. Patient Response: Anxious  Transfers Overall transfer level: Needs assistance Equipment used: Rolling walker (2 wheels) Transfers: Sit to/from Stand Sit to Stand: Min assist           General transfer comment: min assist for power to stand and controll descent back to bed with cues for RW management. Pt anxious with movement and anxious regarding inability to move ankles bilaterally. Pt reporting this is new.    Ambulation/Gait Ambulation/Gait assistance: Mod assist Gait Distance (Feet): 10 Feet Assistive device: Rolling walker (2 wheels) Gait Pattern/deviations: Step-to pattern, Step-through pattern, Decreased step length -  right, Decreased dorsiflexion - right, Decreased dorsiflexion - left, Knee flexed in stance - right, Knee flexed in stance - left       General Gait Details: stepto pattern leading with RLE  with ankle stuck in plantarflexion during IC and throughout stance phase, pt with moderate assist for RW management and cues for safe, turning back to bed. Pt with decreased R lateral weight shift throughout entire gait cycle.  Stairs            Wheelchair Mobility     Tilt Bed Tilt Bed Patient Response: Anxious  Modified Rankin (Stroke Patients Only)       Balance Overall balance assessment: Needs assistance Sitting-balance support: No upper extremity supported Sitting balance-Leahy Scale: Good Sitting balance - Comments: sitting EOB Postural control: Left lateral lean Standing balance support: Bilateral upper extremity supported, During functional activity, Reliant on assistive device for balance Standing balance-Leahy Scale: Poor Standing balance comment: left lateral shift in standing with R ankle in plantarflexion.                             Pertinent Vitals/Pain Pain Assessment Pain Assessment: Faces Faces Pain Scale: No hurt    Home Living Family/patient expects to be discharged to:: Private residence Living Arrangements: Alone Available Help at Discharge: Friend(s);Available PRN/intermittently Type of Home: House Home Access: Stairs to enter Entrance Stairs-Rails: Right;Left;Can reach both Entrance Stairs-Number of Steps: 4   Home Layout: One level Home Equipment: Agricultural consultant (2 wheels);Cane - single point;Wheelchair - Manufacturing systems engineer - built in      Prior Function Prior Level of Function : Independent/Modified Independent             Mobility Comments: independent Tourist information centre manager without AD ADLs Comments: independent with ADLs an  iADLs.     Extremity/Trunk Assessment   Upper Extremity Assessment Upper Extremity Assessment: Defer to OT evaluation    Lower Extremity Assessment Lower Extremity Assessment: RLE deficits/detail;Generalized weakness;LLE deficits/detail RLE Deficits / Details: limited R ankle ROM, unable to  move passively through range in sitting. Unable to move into dorsiflexion with full weight bearing. RLE Sensation: WNL LLE Deficits / Details: limited L ankle ROM, poor active and passive mobility in sitting. Can move into ankle dorsiflexion in standing. LLE Sensation: WNL    Cervical / Trunk Assessment Cervical / Trunk Assessment: Kyphotic  Communication   Communication Communication: No apparent difficulties Cueing Techniques: Verbal cues;Tactile cues  Cognition Arousal: Alert Behavior During Therapy: WFL for tasks assessed/performed Overall Cognitive Status: Within Functional Limits for tasks assessed                                          General Comments      Exercises     Assessment/Plan    PT Assessment Patient needs continued PT services  PT Problem List Decreased strength;Decreased range of motion;Decreased activity tolerance;Decreased balance;Decreased mobility       PT Treatment Interventions DME instruction;Gait training;Functional mobility training;Therapeutic activities;Therapeutic exercise;Balance training;Neuromuscular re-education;Stair training    PT Goals (Current goals can be found in the Care Plan section)  Acute Rehab PT Goals Patient Stated Goal: "figure out whats going on with my ankles" PT Goal Formulation: With patient Time For Goal Achievement: 07/12/23 Potential to Achieve Goals: Good    Frequency Min 3X/week     Co-evaluation  AM-PAC PT "6 Clicks" Mobility  Outcome Measure Help needed turning from your back to your side while in a flat bed without using bedrails?: None Help needed moving from lying on your back to sitting on the side of a flat bed without using bedrails?: None Help needed moving to and from a bed to a chair (including a wheelchair)?: A Lot Help needed standing up from a chair using your arms (e.g., wheelchair or bedside chair)?: A Lot Help needed to walk in hospital room?: A  Lot Help needed climbing 3-5 steps with a railing? : Total 6 Click Score: 15    End of Session Equipment Utilized During Treatment: Gait belt   Patient left: in bed;with nursing/sitter in room;with call bell/phone within reach Nurse Communication: Mobility status PT Visit Diagnosis: Other abnormalities of gait and mobility (R26.89);Muscle weakness (generalized) (M62.81);Unsteadiness on feet (R26.81)    Time: 1610-9604 PT Time Calculation (min) (ACUTE ONLY): 24 min   Charges:   PT Evaluation $PT Eval Moderate Complexity: 1 Mod   PT General Charges $$ ACUTE PT VISIT: 1 Visit         Elie Goody, DPT [facility]- [place] 336 [phone] office  Nelida Meuse 06/28/2023, 12:18 PM

## 2023-06-28 NOTE — Progress Notes (Signed)
Patient ID: Sara Stafford, female   DOB: 1946-01-13, 78 y.o.   MRN: [id number] [facility] Progress Note   Assessment/ Plan:   1.  Hyponatremia: Acute on chronic likely associated with COPD exacerbation; hypervolemic with true hypoosmolar hyponatremia and labs pointing to inappropriate ADH.  Sodium level sluggish/poorly improved overnight with sodium chloride tablets, fluid restriction and furosemide.  Will prescribe tolvaptan today. 2.  Hyperkalemia: Improved/corrected with Lokelma, continue dietary potassium restriction and monitor with furosemide/tolvaptan. 3.  Acute hypoxic respiratory failure/COPD exacerbation: On bronchodilator therapy/inhaled corticosteroids as well as systemic corticosteroid therapy with methylprednisolone. 4.  Hypertension: Blood pressures remain elevated on amlodipine and metoprolol; heart rate permissive to up titration of metoprolol which I have increased to 50 mg twice daily.  Subjective:   Complains of some numbness of the left leg and weakness (chronic from "pinched nerve").  She denies any chest pain or shortness of breath.   Objective:   BP (!) 199/78   Pulse 85   Temp 97.9 F (36.6 C) (Axillary)   Resp 19   Ht 5\' 5"  (1.651 m)   Wt 57.5 kg   SpO2 100%   BMI 21.09 kg/m   Intake/Output Summary (Last 24 hours) at 06/28/2023 0805 Last data filed at 06/28/2023 4782 Gross per 24 hour  Intake 960 ml  Output 1750 ml  Net -790 ml   Weight change: -1.9 kg  Physical Exam: Gen: Resting comfortably in bed, RN at bedside administering medications CVS: Pulse regular rhythm, normal rate, S1 and S2 normal Resp: Intermittent expiratory rhonchi audible without obvious wheeze Abd: Soft, flat, nontender, bowel sounds normal Ext: Bilateral lower extremity edema with 1+ right hand edema  Imaging: No results found.   Labs: BMET Recent Labs  Lab 06/25/23 0548 06/25/23 0947 06/26/23 0900 06/26/23 1243 06/26/23 1703 06/26/23 2105  06/27/23 0430 06/27/23 0916 06/27/23 1249 06/27/23 1807 06/27/23 2148 06/28/23 0206 06/28/23 0604 06/28/23 0739  NA 115*   < > 114* 115* 117*   < > 119* 117* 117* 120* 121* 120* 119* 119*  K 3.7   < > 4.2 5.6* 6.0*  --  5.6*  --  4.5  --   --   --  5.0 4.7  CL 81*   < > 77* 77* 78*  --  82*  --  77*  --   --   --  77* 78*  CO2 18*   < > 25 27 28   --  26  --  27  --   --   --  31 30  GLUCOSE 380*   < > 337* 222* 185*  --  232*  --  212*  --   --   --  267* 246*  BUN 15   < > 22 24* 50*  --  55*  --  43*  --   --   --  33* 31*  CREATININE 0.66   < > 0.78 0.87 0.86  --  0.75  --  0.83  --   --   --  0.73 0.73  CALCIUM 6.8*   < > 7.5* 8.3* 8.6*  --  8.5*  --  8.5*  --   --   --  8.5* 8.4*  PHOS 2.5  --   --   --   --   --   --   --   --   --   --   --  3.3  --    < > =  values in this interval not displayed.   CBC Recent Labs  Lab 06/24/23 2223 06/25/23 0548  WBC 12.2* 9.2  NEUTROABS 10.6*  --   HGB 10.3* 9.9*  HCT 28.7* 28.6*  MCV 81.8 82.2  PLT 467* 417*    Medications:     acetylcysteine  4 mL Nebulization BID   ALPRAZolam  0.25 mg Oral BID   amLODipine  10 mg Oral Daily   arformoterol  15 mcg Nebulization BID   budesonide (PULMICORT) nebulizer solution  0.5 mg Nebulization BID   calcium carbonate  1 tablet Oral Q breakfast   Chlorhexidine Gluconate Cloth  6 each Topical Daily   dextromethorphan-guaiFENesin  1 tablet Oral BID   feeding supplement (GLUCERNA SHAKE)  237 mL Oral TID BM   furosemide  40 mg Intravenous Daily   insulin aspart  0-20 Units Subcutaneous TID WC   insulin aspart  0-5 Units Subcutaneous QHS   insulin aspart  5 Units Subcutaneous TID WC   insulin glargine-yfgn  18 Units Subcutaneous Daily   insulin starter kit- pen needles  1 kit Other Once   ipratropium-albuterol  3 mL Nebulization TID   methylPREDNISolone (SOLU-MEDROL) injection  40 mg Intravenous Q24H   metoprolol tartrate  50 mg Oral BID   polyethylene glycol  17 g Oral Daily   rosuvastatin   20 mg Oral QHS   sodium chloride  1 g Oral BID WC   tolvaptan  15 mg Oral Once    Zetta Bills, MD 06/28/2023, 8:05 AM

## 2023-06-29 DIAGNOSIS — E871 Hypo-osmolality and hyponatremia: Secondary | ICD-10-CM | POA: Diagnosis not present

## 2023-06-29 LAB — BASIC METABOLIC PANEL
Anion gap: 11 (ref 5–15)
BUN: 28 mg/dL — ABNORMAL HIGH (ref 8–23)
CO2: 30 mmol/L (ref 22–32)
Calcium: 8.5 mg/dL — ABNORMAL LOW (ref 8.9–10.3)
Chloride: 80 mmol/L — ABNORMAL LOW (ref 98–111)
Creatinine, Ser: 0.69 mg/dL (ref 0.44–1.00)
GFR, Estimated: >60 mL/min (ref >60)
Glucose, Bld: 225 mg/dL — ABNORMAL HIGH (ref 70–99)
Potassium: 4.8 mmol/L (ref 3.5–5.1)
Sodium: 121 mmol/L — ABNORMAL LOW (ref 135–145)

## 2023-06-29 LAB — SODIUM
Sodium: 121 mmol/L — ABNORMAL LOW (ref 135–145)
Sodium: 116 mmol/L — CL (ref 135–145)
Sodium: 122 mmol/L — ABNORMAL LOW (ref 135–145)

## 2023-06-29 LAB — GLUCOSE, CAPILLARY
Glucose-Capillary: 251 mg/dL — ABNORMAL HIGH (ref 70–99)
Glucose-Capillary: 324 mg/dL — ABNORMAL HIGH (ref 70–99)
Glucose-Capillary: 94 mg/dL (ref 70–99)
Glucose-Capillary: 215 mg/dL — ABNORMAL HIGH (ref 70–99)

## 2023-06-29 MED ORDER — TOLVAPTAN 15 MG PO TABS
15.0000 mg | ORAL_TABLET | Freq: Once | ORAL | Status: AC
Start: 2023-06-29 — End: 2023-06-29
  Administered 2023-06-29: 15 mg via ORAL
  Filled 2023-06-29: qty 1

## 2023-06-29 MED ORDER — PREDNISONE 20 MG PO TABS: 40.0000 mg | ORAL_TABLET | Freq: Every day | ORAL | Status: DC

## 2023-06-29 MED ORDER — SODIUM CHLORIDE 1 G PO TABS
1.0000 g | ORAL_TABLET | Freq: Three times a day (TID) | ORAL | Status: DC
  Administered 2023-06-29 – 2023-07-01 (×9): 1 g via ORAL
  Filled 2023-06-29 (×9): qty 1

## 2023-06-29 MED ORDER — INSULIN GLARGINE-YFGN 100 UNIT/ML ~~LOC~~ SOLN
25.0000 [IU] | Freq: Every day | SUBCUTANEOUS | Status: DC
  Administered 2023-06-30 – 2023-07-03 (×4): 25 [IU] via SUBCUTANEOUS
  Filled 2023-06-29 (×5): qty 0.25

## 2023-06-29 NOTE — Progress Notes (Signed)
Patient ID: Sara Stafford, female   DOB: Jul 21, 1945, 78 y.o.   MRN: [id number] [facility] Progress Note   Assessment/ Plan:   1.  Hyponatremia: Acute on chronic likely associated with COPD exacerbation; hypervolemic with true hypoosmolar hyponatremia and labs pointing to inappropriate ADH.  Sodium level sluggish/poorly improved with sodium chloride tablets, fluid restriction and furosemide.  She got tolvaptan 15 mg once on 1/29.  - tolvaptan 15 mg once today  - I have re-ordered strict ins/outs which are not being collected   - on lasix - got a dose today.  Will pause  - sodium of 121 corrects to 123/124.  Slight improvement with tolvaptan and note uncontrolled hyperglycemia - on salt tabs   2.  Hyperkalemia: Improved/corrected with Lokelma, continue dietary potassium restriction. On lasix which I have stopped   3.  Acute hypoxic respiratory failure/COPD exacerbation: On bronchodilator therapy/inhaled corticosteroids as well as systemic corticosteroid therapy with methylprednisolone.  Per primary team - decreased respiratory reserve    4.  Hypertension - improved s/p medication titration   Continue inpatient monitoring     Subjective:    She remains in the ICU.  She got tolvaptan 15 mg once on 1/29 at 9:34 am.  Team is doing discharge planning.  In the ICU she did not have strict ins/outs - she had one unmeasured urine void charted.  Feels better than she did.  Frustrated with losing strength here  Review of systems:  States shortness of breath is better; no chest pain Eating more; nausea earlier which is better Not walking here; thinks she needs rehab and I asked her to let primary team know       Objective:   BP (!) 160/88   Pulse 77   Temp 98.3 F (36.8 C) (Oral)   Resp 18   Ht 5\' 5"  (1.651 m)   Wt 56.5 kg   SpO2 95%   BMI 20.73 kg/m   Intake/Output Summary (Last 24 hours) at 06/29/2023 1007 Last data filed at 06/29/2023 0857 Gross per 24 hour   Intake 360 ml  Output 1200 ml  Net -840 ml   Weight change: -1 kg  Physical Exam:   General adult female in bed in no acute distress HEENT normocephalic atraumatic extraocular movements intact sclera anicteric Neck supple trachea midline Lungs clear to auscultation bilaterally normal work of breathing at rest  Heart regular rate and rhythm no rubs or gallops appreciated Abdomen soft nontender nondistended Extremities no edema  Psych normal mood and affect Neuro -alert and oriented x 3 provides hx and follows commands  GU - has a purewick   Imaging: DG Hand 2 View Left Result Date: 06/29/2023 CLINICAL DATA:  Finger deformity EXAM: LEFT HAND - 2 VIEW COMPARISON:  None Available. FINDINGS: There is no evidence of fracture or dislocation. There is no evidence of arthropathy or other focal bone abnormality. Mild soft tissue swelling is noted in the distal aspect of the second digit. IMPRESSION: No acute fracture or dislocation is noted. Electronically Signed   By: Alcide Clever M.D.   On: 06/29/2023 03:31   DG Ankle 2 Views Right Result Date: 06/28/2023 CLINICAL DATA:  Right ankle pain. EXAM: RIGHT ANKLE - 2 VIEW COMPARISON:  None available FINDINGS: The ankle mortise is symmetric and intact. Minimal chronic enthesopathic change at the Achilles insertion on the calcaneus. Moderate diffuse lateral greater than medial ankle soft tissue swelling. There is minimal 1 mm cortical step-off of the distal lateral aspect  of the fibula. This could represent a tiny superficial acute to subacute subcortical fracture. IMPRESSION: 1. Moderate diffuse lateral greater than medial ankle soft tissue swelling. 2. Possible tiny superficial acute to subacute subcortical fracture of the distal lateral aspect of the fibula. No displacement. Electronically Signed   By: Neita Garnet M.D.   On: 06/28/2023 18:25   DG Ankle 2 Views Left Result Date: 06/28/2023 CLINICAL DATA:  Left greater right ankle pain for months. Fall  several months ago. EXAM: LEFT ANKLE - 2 VIEW COMPARISON:  None Available. FINDINGS: The ankle mortise is symmetric and intact. The ankle joint space is preserved. No acute fracture or dislocation. Mild great toe metatarsal head-sesamoid joint space narrowing and subchondral sclerosis. IMPRESSION: Mild great toe metatarsal head-sesamoid osteoarthritis. Electronically Signed   By: Neita Garnet M.D.   On: 06/28/2023 18:22     Labs: BMET Recent Labs  Lab 06/25/23 0548 06/25/23 0947 06/26/23 1243 06/26/23 1703 06/26/23 2105 06/27/23 0430 06/27/23 1914 06/27/23 1249 06/27/23 1807 06/28/23 0206 06/28/23 0604 06/28/23 0739 06/28/23 1533 06/28/23 1741 06/29/23 0152 06/29/23 0549  NA 115*   < > 115* 117*   < > 119*   < > 117*   < > 120* 119* 119* 122* 121* 121* 121*  K 3.7   < > 5.6* 6.0*  --  5.6*  --  4.5  --   --  5.0 4.7  --   --   --  4.8  CL 81*   < > 77* 78*  --  82*  --  77*  --   --  77* 78*  --   --   --  80*  CO2 18*   < > 27 28  --  26  --  27  --   --  31 30  --   --   --  30  GLUCOSE 380*   < > 222* 185*  --  232*  --  212*  --   --  267* 246*  --   --   --  225*  BUN 15   < > 24* 50*  --  55*  --  43*  --   --  33* 31*  --   --   --  28*  CREATININE 0.66   < > 0.87 0.86  --  0.75  --  0.83  --   --  0.73 0.73  --   --   --  0.69  CALCIUM 6.8*   < > 8.3* 8.6*  --  8.5*  --  8.5*  --   --  8.5* 8.4*  --   --   --  8.5*  PHOS 2.5  --   --   --   --   --   --   --   --   --  3.3  --   --   --   --   --    < > = values in this interval not displayed.   CBC Recent Labs  Lab 06/24/23 2223 06/25/23 0548  WBC 12.2* 9.2  NEUTROABS 10.6*  --   HGB 10.3* 9.9*  HCT 28.7* 28.6*  MCV 81.8 82.2  PLT 467* 417*    Medications:     acetylcysteine  4 mL Nebulization BID   ALPRAZolam  0.25 mg Oral BID   amLODipine  10 mg Oral Daily   arformoterol  15 mcg Nebulization BID   budesonide (PULMICORT) nebulizer solution  0.5 mg Nebulization BID   calcium carbonate  1 tablet Oral Q  breakfast   Chlorhexidine Gluconate Cloth  6 each Topical Daily   dextromethorphan-guaiFENesin  1 tablet Oral BID   feeding supplement (GLUCERNA SHAKE)  237 mL Oral TID BM   furosemide  40 mg Intravenous Daily   insulin aspart  0-20 Units Subcutaneous TID WC   insulin aspart  0-5 Units Subcutaneous QHS   insulin aspart  5 Units Subcutaneous TID WC   insulin glargine-yfgn  18 Units Subcutaneous Daily   insulin starter kit- pen needles  1 kit Other Once   ipratropium-albuterol  3 mL Nebulization TID   methylPREDNISolone (SOLU-MEDROL) injection  40 mg Intravenous Q24H   metoprolol tartrate  50 mg Oral BID   polyethylene glycol  17 g Oral Daily   rosuvastatin  20 mg Oral QHS   sodium chloride  1 g Oral TID WC    Estanislado Emms, MD 06/29/2023, 10:32 AM

## 2023-06-29 NOTE — TOC Progression Note (Addendum)
Transition of Care [facility]) - Progression Note    Patient Details  Name: Sara Stafford MRN: [id number] Date of Birth: 19-Dec-1945  [facility] CM/SW Contact  Villa Herb, [place] Phone Number: 06/29/2023, 11:03 AM  Clinical Narrative:    Pts first choice [facility] offered bed for SNF placement. CSW spoke with pt at bedside about bed offers, pt accepts offer at [facility]. CSW updated HUB to reflect bed choice. Kerri in admission updated also.  Insurance Berkley Harvey has been started and is pending at this time. TOC to follow.   Expected Discharge Plan: Home w Home Health Services Barriers to Discharge: Continued Medical Work up  Expected Discharge Plan and Services In-house Referral: Clinical Social Work Discharge Planning Services: CM Consult Post Acute Care Choice: Home Health Living arrangements for the past 2 months: Single Family Home                                       Social Determinants of Health (SDOH) Interventions SDOH Screenings   Food Insecurity: No Food Insecurity (06/25/2023)  Housing: Low Risk  (06/25/2023)  Transportation Needs: No Transportation Needs (06/25/2023)  Utilities: Not At Risk (06/25/2023)  Financial Resource Strain: Low Risk  (05/05/2023)   Received from [facility]  Social Connections: Moderately Isolated (06/25/2023)  Tobacco Use: Medium Risk (06/25/2023)    Readmission Risk Interventions    06/27/2023    3:33 PM  Readmission Risk Prevention Plan  Transportation Screening Complete  HRI or Home Care Consult Complete  Social Work Consult for Recovery Care Planning/Counseling Complete  Palliative Care Screening Not Applicable  Medication Review [employer]) Complete

## 2023-06-29 NOTE — Progress Notes (Signed)
PROGRESS NOTE  FRANCELLA BARNETT AVW:[id number] DOB: [birth year] DOA: 06/24/2023 PCP: Danella Penton, MD  Brief History:  78 year old female with a history of diabetes mellitus type 2, hypertension, hyperlipidemia, anxiety/depression, COPD, tobacco abuse in remission presenting with 3-week history of shortness of breath, wheezing, coughing, and chest congestion.  The patient went to see her PCP on 06/20/2023.  The patient received IM injection of Kenalog.  She was given a prescription for azithromycin and cefdinir.  She finished azithromycin.  She quit taking the cefdinir after 3 doses because she felt like it was making her symptoms worse.  She denied any fevers, chills, headache, chest pain, abdominal pain.  She has some nausea and posttussive emesis.  She had some loose stools without any hematochezia or melena.  She denies any abdominal pain, dysuria, hematuria.  She denies any hemoptysis. Unfortunately, her shortness of breath continue to worsen.  She also noted some lower extremity edema.  As result, she presented for further evaluation and treatment.  She was noted to have oxygen saturation 95% on room air by EMS.  Notably, the patient states that she has been taking sodium chloride twice daily for the past year because of low sodium.  In addition, she states that she has not taken Effexor for over 1 year.  She has only taken 3 doses of Paxil. She had a hospital admission from 07/10/2022 to 07/11/2022 secondary to hyponatremia.  She was started on sodium chloride during this hospitalization.  This was continued until the time of this admission. The patient also was hospitalized from 04/25/2022 to 04/28/2022 for hyponatremia.  She improved with normal saline and stopping her Effexor.  In the ED, the patient was afebrile hemodynamically stable with oxygen saturation 98% on 2 L. WBC 12.2, hemoglobin 10.3, platelets 467.  Sodium 115, potassium 3.6, bicarbonate 22, serum creatinine 0.50.   LFTs were unremarkable.  Chest x-ray showed increased interstitial markings.  The patient was started on normal saline, bronchodilators, and Solu-Medrol.  COVID-19 PCR is negative.  TSH 1.326.  BNP 567.  Troponin 50>> 42.      Subjective:  The patient was seen and examined this morning. Hemodynamically stable, mildly tachypneic and hypertensive satting 95% on room air Repeat sodium level 116 Still complaining bilateral lower extremity ankle pain, weakness generalized, and hand pain When making a fist.  Assessment/Plan:  Acute respiratory failure with hypoxia Much improved satting 95% on room air now -Multifactorial including COPD exacerbation and fluid overload -Has been successfully weaned off supplemental oxygen -Obtain VBG 7.37/43/36/24   COPD exacerbation -Off supplemental oxygen, much improved shortness of breath, wheezing Satting 95% on room air -COVID-19 PCR negative -Check viral respiratory panel+rhino/enterovirus -MRSA screen negative -Check PCT 0.14 -Continue DuoNebs -Started Brovana -Started Pulmicort -Patient has approx 30-pack-year history of tobacco;, quit 20 years ago   Hyponatremia -multifactorial including fluid overload, SIADH, meds, hypovolemia with true hypoosmolar hyponatremia  -She appears clinically fluid overloaded -Continue Check sodium every 4 hours -Give Lasix IV and reassess-Per nephrology holding now  -Urine osmolarity--412 -Serum osmolarity--259 -Urine sodium 25 -06/25/23 Na = 115, but serum glucose 380 -appreciate nephrology consult>>add Urea BID -Nephrologist following, Continue furosemide -holding now -Tolvaptan 15 mg and today 1/29, another dose today 1/30      Fluid overload -Echo EF 65-70%, no WMA, G1DD, normal RVF -Start IV furosemide >>> switching to p.o.--on hold today per nephrology -Daily weights -Accurate I's and O's -obtain ReDS =19   Increased  troponin -Secondary to demand ischemia -Chest pain  presently -Personally reviewed EKG--sinus rhythm, nonspecific T wave change -Echo EF 65-70%, no WMA, G1DD, normal RVF      Anxiety/depression -Continue home dose alprazolam -PDMP reviewed--alprazolam, 0.25 mg, #120, last refill 05/30/2023   Uncontrolled diabetes mellitus type 2 with hyperglycemia -06/25/23 A1C-9.5 -pt was on insulin drip short time 1/26 -Use resistant sliding scale -Holding metformin and glipizide -add novolog 5 units with meals   Essential hypertension -Continue amlodipine and metoprolol   Mixed hyperlipidemia -Continue statin   Leg pain/edema/debility Generalized versus -venous duplex--neg -Continue PT OT, recommending SNF for further PT OT, f   Hypomagnesemia -replete       Family Communication:   no Family at bedside   Consultants:  none   Code Status:  FULL    DVT Prophylaxis:  Holley Lovenox     Disposition: Anticipating SNF in 1-2 days-once sodium improves     procedures: As Listed in Progress Note Above  Antibiotics: None  Procedures: As Listed in Progress Note Above Antibiotics: None     Objective: Vitals:   06/29/23 1000 06/29/23 1031 06/29/23 1100 06/29/23 1121  BP: (!) 155/57  (!) 149/66   Pulse: 73 70 70   Resp: 17 (!) 23 (!) 24   Temp:    (!) 97.4 F (36.3 C)  TempSrc:    Oral  SpO2: 96% 100% 95%   Weight:      Height:        Intake/Output Summary (Last 24 hours) at 06/29/2023 1240 Last data filed at 06/29/2023 1200 Gross per 24 hour  Intake 600 ml  Output 1200 ml  Net -600 ml   Weight change: -1 kg        General:  AAO x 3,  cooperative, no distress;   HEENT:  Normocephalic, PERRL, otherwise with in Normal limits   Neuro:  CNII-XII intact. , normal motor and sensation, reflexes intact   Lungs:   Clear to auscultation BL, Respirations unlabored,  No wheezes / crackles  Cardio:    S1/S2, RRR, No murmure, No Rubs or Gallops   Abdomen:  Soft, non-tender, bowel sounds active all four quadrants, no  guarding or peritoneal signs.  Muscular  skeletal:  Limited exam -global generalized weaknesses Bilateral ankle pain, hand/finger pain with movements range of motion's are intact Limited range of motion at the ankle area due to weakness - in bed, able to move all 4 extremities,   2+ pulses,  symmetric, No pitting edema  Skin:  Dry, warm to touch, negative for any Rashes,  Wounds: Please see nursing documentation           Data Reviewed: I have personally reviewed following labs and imaging studies Basic Metabolic Panel: Recent Labs  Lab 06/25/23 0548 06/25/23 0947 06/26/23 0424 06/26/23 0900 06/27/23 0430 06/27/23 0916 06/27/23 1249 06/27/23 1807 06/28/23 0604 06/28/23 0739 06/28/23 1533 06/28/23 1741 06/29/23 0152 06/29/23 0549 06/29/23 0955  NA 115*   < > 118*   < > 119*   < > 117*   < > 119* 119* 122* 121* 121* 121* 116*  K 3.7   < > 5.1   < > 5.6*  --  4.5  --  5.0 4.7  --   --   --  4.8  --   CL 81*   < > 80*   < > 82*  --  77*  --  77* 78*  --   --   --  80*  --   CO2 18*   < > 27   < > 26  --  27  --  31 30  --   --   --  30  --   GLUCOSE 380*   < > 272*   < > 232*  --  212*  --  267* 246*  --   --   --  225*  --   BUN 15   < > 21   < > 55*  --  43*  --  33* 31*  --   --   --  28*  --   CREATININE 0.66   < > 0.71   < > 0.75  --  0.83  --  0.73 0.73  --   --   --  0.69  --   CALCIUM 6.8*   < > 7.6*   < > 8.5*  --  8.5*  --  8.5* 8.4*  --   --   --  8.5*  --   MG 0.8*  --  1.3*  --   --   --   --   --   --   --   --   --   --   --   --   PHOS 2.5  --   --   --   --   --   --   --  3.3  --   --   --   --   --   --    < > = values in this interval not displayed.   Liver Function Tests: Recent Labs  Lab 06/24/23 2223 06/25/23 0548 06/26/23 1243 06/28/23 0604 06/28/23 0739  AST 21 19 30   --  23  ALT 21 21 24   --  24  ALKPHOS 96 89 91  --  87  BILITOT 0.4 0.9 0.5  --  0.6  PROT 6.1* 6.1* 6.8  --  6.4*  ALBUMIN 2.7* 2.6* 3.1* 3.0* 3.0*   No results for  input(s): "LIPASE", "AMYLASE" in the last 168 hours. No results for input(s): "AMMONIA" in the last 168 hours. Coagulation Profile: No results for input(s): "INR", "PROTIME" in the last 168 hours. CBC: Recent Labs  Lab 06/24/23 2223 06/25/23 0548  WBC 12.2* 9.2  NEUTROABS 10.6*  --   HGB 10.3* 9.9*  HCT 28.7* 28.6*  MCV 81.8 82.2  PLT 467* 417*   Cardiac Enzymes: No results for input(s): "CKTOTAL", "CKMB", "CKMBINDEX", "TROPONINI" in the last 168 hours. BNP: Invalid input(s): "POCBNP" CBG: Recent Labs  Lab 06/28/23 1139 06/28/23 1639 06/28/23 1956 06/29/23 0758 06/29/23 1132  GLUCAP 262* 120* 152* 251* 324*   HbA1C: No results for input(s): "HGBA1C" in the last 72 hours.  Urine analysis:    Component Value Date/Time   COLORURINE YELLOW (A) 04/25/2022 1857   APPEARANCEUR HAZY (A) 04/25/2022 1857   APPEARANCEUR Hazy (A) 01/26/2021 1309   LABSPEC 1.011 04/25/2022 1857   PHURINE 5.0 04/25/2022 1857   GLUCOSEU NEGATIVE 04/25/2022 1857   GLUCOSEU NEGATIVE 02/29/2012 1116   HGBUR NEGATIVE 04/25/2022 1857   HGBUR large 01/01/2010 1204   BILIRUBINUR NEGATIVE 04/25/2022 1857   BILIRUBINUR Negative 01/26/2021 1309   KETONESUR NEGATIVE 04/25/2022 1857   PROTEINUR NEGATIVE 04/25/2022 1857   UROBILINOGEN 0.2 02/29/2012 1116   NITRITE NEGATIVE 04/25/2022 1857   LEUKOCYTESUR NEGATIVE 04/25/2022 1857   Sepsis Labs: Reviewed @LABRCNTIP (procalcitonin:4,lacticidven:4) COVID/viral panel all negative  Scheduled Meds:  acetylcysteine  4  mL Nebulization BID   ALPRAZolam  0.25 mg Oral BID   amLODipine  10 mg Oral Daily   arformoterol  15 mcg Nebulization BID   budesonide (PULMICORT) nebulizer solution  0.5 mg Nebulization BID   calcium carbonate  1 tablet Oral Q breakfast   Chlorhexidine Gluconate Cloth  6 each Topical Daily   dextromethorphan-guaiFENesin  1 tablet Oral BID   feeding supplement (GLUCERNA SHAKE)  237 mL Oral TID BM   insulin aspart  0-20 Units Subcutaneous  TID WC   insulin aspart  0-5 Units Subcutaneous QHS   insulin aspart  5 Units Subcutaneous TID WC   insulin glargine-yfgn  18 Units Subcutaneous Daily   insulin starter kit- pen needles  1 kit Other Once   ipratropium-albuterol  3 mL Nebulization TID   methylPREDNISolone (SOLU-MEDROL) injection  40 mg Intravenous Q24H   metoprolol tartrate  50 mg Oral BID   polyethylene glycol  17 g Oral Daily   rosuvastatin  20 mg Oral QHS   sodium chloride  1 g Oral TID WC   Continuous Infusions:  insulin Stopped (06/25/23 1604)    Procedures/Studies: DG Hand 2 View Left Result Date: 06/29/2023 CLINICAL DATA:  Finger deformity EXAM: LEFT HAND - 2 VIEW COMPARISON:  None Available. FINDINGS: There is no evidence of fracture or dislocation. There is no evidence of arthropathy or other focal bone abnormality. Mild soft tissue swelling is noted in the distal aspect of the second digit. IMPRESSION: No acute fracture or dislocation is noted. Electronically Signed   By: Alcide Clever M.D.   On: 06/29/2023 03:31   DG Ankle 2 Views Right Result Date: 06/28/2023 CLINICAL DATA:  Right ankle pain. EXAM: RIGHT ANKLE - 2 VIEW COMPARISON:  None available FINDINGS: The ankle mortise is symmetric and intact. Minimal chronic enthesopathic change at the Achilles insertion on the calcaneus. Moderate diffuse lateral greater than medial ankle soft tissue swelling. There is minimal 1 mm cortical step-off of the distal lateral aspect of the fibula. This could represent a tiny superficial acute to subacute subcortical fracture. IMPRESSION: 1. Moderate diffuse lateral greater than medial ankle soft tissue swelling. 2. Possible tiny superficial acute to subacute subcortical fracture of the distal lateral aspect of the fibula. No displacement. Electronically Signed   By: Neita Garnet M.D.   On: 06/28/2023 18:25   DG Ankle 2 Views Left Result Date: 06/28/2023 CLINICAL DATA:  Left greater right ankle pain for months. Fall several months  ago. EXAM: LEFT ANKLE - 2 VIEW COMPARISON:  None Available. FINDINGS: The ankle mortise is symmetric and intact. The ankle joint space is preserved. No acute fracture or dislocation. Mild great toe metatarsal head-sesamoid joint space narrowing and subchondral sclerosis. IMPRESSION: Mild great toe metatarsal head-sesamoid osteoarthritis. Electronically Signed   By: Neita Garnet M.D.   On: 06/28/2023 18:22   US Venous Img Lower Bilateral (DVT) Result Date: 06/25/2023 CLINICAL DATA:  Chest pain and lower extremity edema EXAM: BILATERAL LOWER EXTREMITY VENOUS DOPPLER ULTRASOUND TECHNIQUE: Gray-scale sonography with graded compression, as well as color Doppler and duplex ultrasound were performed to evaluate the lower extremity deep venous systems from the level of the common femoral vein and including the common femoral, femoral, profunda femoral, popliteal and calf veins including the posterior tibial, peroneal and gastrocnemius veins when visible. The superficial great saphenous vein was also interrogated. Spectral Doppler was utilized to evaluate flow at rest and with distal augmentation maneuvers in the common femoral, femoral and popliteal veins. COMPARISON:  None Available. FINDINGS: RIGHT LOWER EXTREMITY Common Femoral Vein: No evidence of thrombus. Normal compressibility, respiratory phasicity and response to augmentation. Saphenofemoral Junction: No evidence of thrombus. Normal compressibility and flow on color Doppler imaging. Profunda Femoral Vein: No evidence of thrombus. Normal compressibility and flow on color Doppler imaging. Femoral Vein: No evidence of thrombus. Normal compressibility, respiratory phasicity and response to augmentation. Popliteal Vein: No evidence of thrombus. Normal compressibility, respiratory phasicity and response to augmentation. Calf Veins: No evidence of thrombus. Normal compressibility and flow on color Doppler imaging. Superficial Great Saphenous Vein: No evidence of  thrombus. Normal compressibility. Venous Reflux:  None. Other Findings:  None. LEFT LOWER EXTREMITY Common Femoral Vein: No evidence of thrombus. Normal compressibility, respiratory phasicity and response to augmentation. Saphenofemoral Junction: No evidence of thrombus. Normal compressibility and flow on color Doppler imaging. Profunda Femoral Vein: No evidence of thrombus. Normal compressibility and flow on color Doppler imaging. Femoral Vein: No evidence of thrombus. Normal compressibility, respiratory phasicity and response to augmentation. Popliteal Vein: No evidence of thrombus. Normal compressibility, respiratory phasicity and response to augmentation. Calf Veins: No evidence of thrombus. Normal compressibility and flow on color Doppler imaging. Superficial Great Saphenous Vein: No evidence of thrombus. Normal compressibility. Venous Reflux:  None. Other Findings:  None. IMPRESSION: No evidence of deep venous thrombosis in either lower extremity. Electronically Signed   By: Malachy Moan M.D.   On: 06/25/2023 11:31   CT Angio Chest Pulmonary Embolism (PE) W or WO Contrast Result Date: 06/25/2023 CLINICAL DATA:  PE suspected, positive D-dimer, shortness of breath EXAM: CT ANGIOGRAPHY CHEST WITH CONTRAST TECHNIQUE: Multidetector CT imaging of the chest was performed using the standard protocol during bolus administration of intravenous contrast. Multiplanar CT image reconstructions and MIPs were obtained to evaluate the vascular anatomy. RADIATION DOSE REDUCTION: This exam was performed according to the departmental dose-optimization program which includes automated exposure control, adjustment of the mA and/or kV according to patient size and/or use of iterative reconstruction technique. CONTRAST:  75mL OMNIPAQUE IOHEXOL 350 MG/ML SOLN COMPARISON:  05/20/2022 FINDINGS: Cardiovascular: Satisfactory opacification of the pulmonary arteries to the segmental level. No evidence of pulmonary embolism. Normal  heart size. Left coronary artery calcifications. No pericardial effusion. Aortic atherosclerosis. Mediastinum/Nodes: No enlarged mediastinal, hilar, or axillary lymph nodes. Thyroid gland, trachea, and esophagus demonstrate no significant findings. Lungs/Pleura: Small right, trace left pleural effusions. Mild centrilobular emphysema. Diffuse bilateral bronchial wall thickening. Upper Abdomen: No acute abnormality.  Status post cholecystectomy. Musculoskeletal: No chest wall abnormality. No acute osseous findings. Review of the MIP images confirms the above findings. IMPRESSION: 1. Negative examination for pulmonary embolism. 2. Small right, trace left pleural effusions. 3. Mild emphysema and diffuse bilateral bronchial wall thickening. 4. Coronary artery disease. Aortic Atherosclerosis (ICD10-I70.0). Electronically Signed   By: Jearld Lesch M.D.   On: 06/25/2023 11:22   ECHOCARDIOGRAM COMPLETE Result Date: 06/25/2023    ECHOCARDIOGRAM REPORT   Patient Name:   Sara Stafford Date of Exam: 06/25/2023 Medical Rec #:  [id number]            Height:       65.0 in Accession #:    [id number]           Weight:       130.5 lb Date of Birth:  [birth year]            BSA:          1.650 m Patient Age:    63 years  BP:           188/98 mmHg Patient Gender: F                    HR:           106 bpm. Exam Location:  Inpatient Procedure: 2D Echo, Cardiac Doppler, Color Doppler and Intracardiac            Opacification Agent Indications:    CHF Acute Systolic  History:        Patient has prior history of Echocardiogram examinations and                 Patient has no prior history of Echocardiogram examinations.                 Risk Factors:Hypertension, Diabetes, Dyslipidemia and Former                 Smoker.  Sonographer:    Karma Ganja Referring Phys: [id number] OLADAPO ADEFESO  Sonographer Comments: Technically challenging study due to limited acoustic windows. Image acquisition challenging due to patient body  habitus. IMPRESSIONS  1. Left ventricular ejection fraction, by estimation, is 65 to 70%. The left ventricle has normal function. The left ventricle has no regional wall motion abnormalities. Left ventricular diastolic parameters are consistent with Grade I diastolic dysfunction (impaired relaxation). Elevated left atrial pressure.  2. Right ventricular systolic function is normal. The right ventricular size is normal.  3. The mitral valve is normal in structure. Trivial mitral valve regurgitation. No evidence of mitral stenosis.  4. The aortic valve was not well visualized. Aortic valve regurgitation is not visualized. No aortic stenosis is present.  5. The inferior vena cava is normal in size with greater than 50% respiratory variability, suggesting right atrial pressure of 3 mmHg. Comparison(s): No prior Echocardiogram. FINDINGS  Left Ventricle: Left ventricular ejection fraction, by estimation, is 65 to 70%. The left ventricle has normal function. The left ventricle has no regional wall motion abnormalities. Definity contrast agent was given IV to delineate the left ventricular  endocardial borders. The left ventricular internal cavity size was normal in size. There is no left ventricular hypertrophy. Left ventricular diastolic parameters are consistent with Grade I diastolic dysfunction (impaired relaxation). Elevated left atrial pressure. Right Ventricle: The right ventricular size is normal. Right ventricular systolic function is normal. Left Atrium: Left atrial size was normal in size. Right Atrium: Right atrial size was normal in size. Pericardium: There is no evidence of pericardial effusion. Mitral Valve: The mitral valve is normal in structure. Trivial mitral valve regurgitation. No evidence of mitral valve stenosis. Tricuspid Valve: The tricuspid valve is normal in structure. Tricuspid valve regurgitation is not demonstrated. No evidence of tricuspid stenosis. Aortic Valve: The aortic valve was not well  visualized. Aortic valve regurgitation is not visualized. No aortic stenosis is present. Aortic valve mean gradient measures 4.0 mmHg. Aortic valve peak gradient measures 7.4 mmHg. Pulmonic Valve: The pulmonic valve was not well visualized. Pulmonic valve regurgitation is not visualized. No evidence of pulmonic stenosis. Aorta: The aortic root is normal in size and structure. Venous: The inferior vena cava is normal in size with greater than 50% respiratory variability, suggesting right atrial pressure of 3 mmHg. IAS/Shunts: No atrial level shunt detected by color flow Doppler.  LEFT VENTRICLE PLAX 2D LVIDd:         4.50 cm     Diastology LVIDs:         3.50  cm     LV e' medial:    6.42 cm/s LV PW:         0.70 cm     LV E/e' medial:  17.0 LV IVS:        0.80 cm     LV e' lateral:   10.10 cm/s LVOT diam:     1.80 cm     LV E/e' lateral: 10.8 LV SV:         59 LV SV Index:   36 LVOT Area:     2.54 cm  LV Volumes (MOD) LV vol d, MOD A2C: 39.1 ml LV vol d, MOD A4C: 50.3 ml LV vol s, MOD A2C: 13.0 ml LV vol s, MOD A4C: 23.9 ml LV SV MOD A2C:     26.1 ml LV SV MOD A4C:     50.3 ml LV SV MOD BP:      28.2 ml RIGHT VENTRICLE            IVC RV S prime:     7.40 cm/s  IVC diam: 2.10 cm LEFT ATRIUM             Index        RIGHT ATRIUM          Index LA diam:        3.30 cm 2.00 cm/m   RA Area:     9.49 cm LA Vol (A2C):   43.9 ml 26.61 ml/m  RA Volume:   20.80 ml 12.61 ml/m LA Vol (A4C):   33.6 ml 20.36 ml/m LA Biplane Vol: 38.6 ml 23.39 ml/m  AORTIC VALVE AV Area (Vmax):    2.34 cm AV Area (Vmean):   2.12 cm AV Area (VTI):     2.50 cm AV Vmax:           136.00 cm/s AV Vmean:          90.500 cm/s AV VTI:            0.236 m AV Peak Grad:      7.4 mmHg AV Mean Grad:      4.0 mmHg LVOT Vmax:         125.00 cm/s LVOT Vmean:        75.500 cm/s LVOT VTI:          0.232 m LVOT/AV VTI ratio: 0.98  AORTA Ao Root diam: 2.60 cm MITRAL VALVE MV Area (PHT): 7.82 cm     SHUNTS MV Decel Time: 97 msec      Systemic VTI:  0.23 m MR  Peak grad: 132.7 mmHg    Systemic Diam: 1.80 cm MR Vmax:      576.00 cm/s MV E velocity: 109.00 cm/s MV A velocity: 117.00 cm/s MV E/A ratio:  0.93 Olga Millers MD Electronically signed by Olga Millers MD Signature Date/Time: 06/25/2023/11:20:12 AM    Final    DG Chest 2 View Result Date: 06/24/2023 CLINICAL DATA:  Cough and dyspnea EXAM: CHEST - 2 VIEW COMPARISON:  None Available. FINDINGS: There are increased central interstitial markings bilaterally. There is no lung consolidation, pleural effusion or pneumothorax. The cardiomediastinal silhouette is within normal limits. No acute fractures are seen. IMPRESSION: Increased central interstitial markings bilaterally may reflect atypical/viral infection. No lung consolidation. Electronically Signed   By: Darliss Cheney M.D.   On: 06/24/2023 22:58    Kendell Bane, MD  [facility]  Total of 65 minutes of critical time was spent evaluating and examining  the patient, reviewing all medical records, current medication discussion with ICU staff and nephrologist.    If 7PM-7AM, please contact night-coverage [email or web address] Password [email or web address] 06/29/2023, 12:40 PM   LOS: 5 days

## 2023-06-29 NOTE — Inpatient Diabetes Management (Signed)
Inpatient Diabetes Program Recommendations  AACE/ADA: New Consensus Statement on Inpatient Glycemic Control (2015)  Target Ranges:  Prepandial:   less than 140 mg/dL      Peak postprandial:   less than 180 mg/dL (1-2 hours)      Critically ill patients:  140 - 180 mg/dL   Lab Results  Component Value Date   GLUCAP 324 (H) 06/29/2023   HGBA1C 9.5 (H) 06/25/2023    Review of Glycemic Control  Latest Reference Range & Units 06/28/23 07:29 06/28/23 11:39 06/28/23 16:39 06/28/23 19:56 06/29/23 07:58 06/29/23 11:32  Glucose-Capillary 70 - 99 mg/dL 540 (H) 981 (H) 191 (H) 152 (H) 251 (H) 324 (H)   Diabetes history: Type 2 DM Outpatient Diabetes medications: Glipizide 10 mg BID, Januvia 50 mg every day, Metformin 750 mg QPM Current orders for Inpatient glycemic control: Novolog 5 units TID, Semglee 18 units every day, Novolog 0-20 units TID & HS  Solumedrol 40 mg every day, Glucerna tid between meals  Inpatient Diabetes Program Recommendations:    -   Increase Semglee to 25 units  Thanks, Christena Deem RN, MSN, BC-ADM Inpatient Diabetes Coordinator Team Pager [phone] (8a-5p)

## 2023-06-29 NOTE — Progress Notes (Signed)
Received call from MD Malen Gauze concerned about 1400 sodium level not received and additional concerns about lab values reported to attending and not nephrology. This RN reached out to lab whom stated that Na level was cancelled and new order would need to be placed. New order placed for repeat Na level, order placed as stat and called lab to confirm that phlebotomist would be sent. Additionally order received to now contact nephrology directly for Na level <116 or >124. This RN will relay order to nightshift RN at change of shift.

## 2023-06-29 NOTE — Plan of Care (Signed)
Problem: Education: Goal: Knowledge of General Education information will improve Description: Including pain rating scale, medication(s)/side effects and non-pharmacologic comfort measures Outcome: Progressing   Problem: Nutrition: Goal: Adequate nutrition will be maintained Outcome: Progressing   Problem: Elimination: Goal: Will not experience complications related to bowel motility Outcome: Progressing

## 2023-06-29 NOTE — Progress Notes (Addendum)
Pt stated she felt like she dislocated her 3rd digit on Left hand. Bruising located at knuckle, cap refill less than 3 seconds. Radial pulse +2. Finger slightly abnormal positioning. Pt able to bend finger. MD made aware awaiting orders.   Order given for a x-ray of the Left hand by Dr. Andrez Grime

## 2023-06-29 NOTE — Progress Notes (Signed)
REDS vest clip unable to be obtained. Attempted x5 times.

## 2023-06-29 NOTE — Plan of Care (Signed)
Problem: Education: Goal: Knowledge of General Education information will improve Description: Including pain rating scale, medication(s)/side effects and non-pharmacologic comfort measures Outcome: Progressing   Problem: Health Behavior/Discharge Planning: Goal: Ability to manage health-related needs will improve Outcome: Progressing   Problem: Clinical Measurements: Goal: Ability to maintain clinical measurements within normal limits will improve Outcome: Progressing Goal: Will remain free from infection Outcome: Progressing Goal: Diagnostic test results will improve Outcome: Progressing Goal: Respiratory complications will improve Outcome: Progressing Goal: Cardiovascular complication will be avoided Outcome: Progressing   Problem: Activity: Goal: Risk for activity intolerance will decrease Outcome: Progressing   Problem: Nutrition: Goal: Adequate nutrition will be maintained Outcome: Progressing   Problem: Coping: Goal: Level of anxiety will decrease Outcome: Progressing   Problem: Elimination: Goal: Will not experience complications related to bowel motility Outcome: Progressing Goal: Will not experience complications related to urinary retention Outcome: Progressing   Problem: Pain Managment: Goal: General experience of comfort will improve and/or be controlled Outcome: Progressing   Problem: Safety: Goal: Ability to remain free from injury will improve Outcome: Progressing   Problem: Skin Integrity: Goal: Risk for impaired skin integrity will decrease Outcome: Progressing   Problem: Education: Goal: Ability to describe self-care measures that may prevent or decrease complications (Diabetes Survival Skills Education) will improve Outcome: Progressing Goal: Individualized Educational Video(s) Outcome: Progressing   Problem: Coping: Goal: Ability to adjust to condition or change in health will improve Outcome: Progressing   Problem: Fluid  Volume: Goal: Ability to maintain a balanced intake and output will improve Outcome: Progressing   Problem: Health Behavior/Discharge Planning: Goal: Ability to identify and utilize available resources and services will improve Outcome: Progressing Goal: Ability to manage health-related needs will improve Outcome: Progressing   Problem: Metabolic: Goal: Ability to maintain appropriate glucose levels will improve Outcome: Progressing   Problem: Nutritional: Goal: Maintenance of adequate nutrition will improve Outcome: Progressing Goal: Progress toward achieving an optimal weight will improve Outcome: Progressing   Problem: Skin Integrity: Goal: Risk for impaired skin integrity will decrease Outcome: Progressing   Problem: Tissue Perfusion: Goal: Adequacy of tissue perfusion will improve Outcome: Progressing   Problem: Education: Goal: Ability to describe self-care measures that may prevent or decrease complications (Diabetes Survival Skills Education) will improve Outcome: Progressing Goal: Individualized Educational Video(s) Outcome: Progressing   Problem: Cardiac: Goal: Ability to maintain an adequate cardiac output will improve Outcome: Progressing   Problem: Health Behavior/Discharge Planning: Goal: Ability to identify and utilize available resources and services will improve Outcome: Progressing Goal: Ability to manage health-related needs will improve Outcome: Progressing   Problem: Fluid Volume: Goal: Ability to achieve a balanced intake and output will improve Outcome: Progressing   Problem: Metabolic: Goal: Ability to maintain appropriate glucose levels will improve Outcome: Progressing   Problem: Education: Goal: Ability to describe self-care measures that may prevent or decrease complications (Diabetes Survival Skills Education) will improve Outcome: Progressing Goal: Individualized Educational Video(s) Outcome: Progressing   Problem: Coping: Goal:  Ability to adjust to condition or change in health will improve Outcome: Progressing   Problem: Fluid Volume: Goal: Ability to maintain a balanced intake and output will improve Outcome: Progressing   Problem: Health Behavior/Discharge Planning: Goal: Ability to identify and utilize available resources and services will improve Outcome: Progressing Goal: Ability to manage health-related needs will improve Outcome: Progressing   Problem: Metabolic: Goal: Ability to maintain appropriate glucose levels will improve Outcome: Progressing   Problem: Nutritional: Goal: Maintenance of adequate nutrition will improve Outcome: Progressing Goal: Progress toward  achieving an optimal weight will improve Outcome: Progressing   Problem: Skin Integrity: Goal: Risk for impaired skin integrity will decrease Outcome: Progressing   Problem: Tissue Perfusion: Goal: Adequacy of tissue perfusion will improve Outcome: Progressing

## 2023-06-30 DIAGNOSIS — E871 Hypo-osmolality and hyponatremia: Secondary | ICD-10-CM | POA: Diagnosis not present

## 2023-06-30 LAB — GLUCOSE, CAPILLARY
Glucose-Capillary: 166 mg/dL — ABNORMAL HIGH (ref 70–99)
Glucose-Capillary: 346 mg/dL — ABNORMAL HIGH (ref 70–99)
Glucose-Capillary: 311 mg/dL — ABNORMAL HIGH (ref 70–99)
Glucose-Capillary: 161 mg/dL — ABNORMAL HIGH (ref 70–99)

## 2023-06-30 LAB — BASIC METABOLIC PANEL
Anion gap: 8 (ref 5–15)
BUN: 32 mg/dL — ABNORMAL HIGH (ref 8–23)
CO2: 33 mmol/L — ABNORMAL HIGH (ref 22–32)
Calcium: 8.7 mg/dL — ABNORMAL LOW (ref 8.9–10.3)
Chloride: 83 mmol/L — ABNORMAL LOW (ref 98–111)
Creatinine, Ser: 0.86 mg/dL (ref 0.44–1.00)
GFR, Estimated: >60 mL/min (ref >60)
Glucose, Bld: 139 mg/dL — ABNORMAL HIGH (ref 70–99)
Potassium: 5.1 mmol/L (ref 3.5–5.1)
Sodium: 124 mmol/L — ABNORMAL LOW (ref 135–145)

## 2023-06-30 MED ORDER — DM-GUAIFENESIN ER 30-600 MG PO TB12
1.0000 | ORAL_TABLET | Freq: Two times a day (BID) | ORAL | Status: DC | PRN
  Filled 2023-06-30: qty 1

## 2023-06-30 MED ORDER — ROSUVASTATIN CALCIUM 20 MG PO TABS
20.0000 mg | ORAL_TABLET | Freq: Every day | ORAL | Status: DC
  Administered 2023-07-01 – 2023-07-02 (×2): 20 mg via ORAL
  Filled 2023-06-30 (×2): qty 1

## 2023-06-30 MED ORDER — HYDRALAZINE HCL 25 MG PO TABS
25.0000 mg | ORAL_TABLET | Freq: Three times a day (TID) | ORAL | Status: DC
  Administered 2023-06-30 – 2023-07-01 (×4): 25 mg via ORAL
  Filled 2023-06-30 (×4): qty 1

## 2023-06-30 MED ORDER — PREDNISONE 20 MG PO TABS
30.0000 mg | ORAL_TABLET | Freq: Every day | ORAL | Status: DC
  Administered 2023-06-30: 30 mg via ORAL
  Filled 2023-06-30: qty 1

## 2023-06-30 MED ORDER — PREDNISONE 20 MG PO TABS
20.0000 mg | ORAL_TABLET | Freq: Every day | ORAL | Status: DC
Start: 2023-07-01 — End: 2023-07-02
  Administered 2023-07-01 – 2023-07-02 (×2): 20 mg via ORAL
  Filled 2023-06-30 (×2): qty 1

## 2023-06-30 NOTE — Progress Notes (Addendum)
Patient ID: Sara Stafford, female   DOB: 1946-02-27, 78 y.o.   MRN: [id number] [facility] Progress Note   Assessment/ Plan:   1.  Hyponatremia: Acute on chronic likely associated with COPD exacerbation; hypervolemic with true hypoosmolar hyponatremia and labs pointing to inappropriate ADH.  Sodium level sluggish/poorly improved with sodium chloride tablets, fluid restriction and furosemide.  She got tolvaptan 15 mg once on 1/29 as well as on 1/30 - defer additional tolvaptan today   - Strict ins/outs  - on 1200 mL fluid restriction for now  - I have stopped lasix - would remain off for now as I am concerned this was exacerbating the correction of her hyponatremia more recently  - on salt tabs  - hyperglycemia management per primary team - improved - goal for near 130's tomorrow with measures above  2.  Hyperkalemia:  - Improved/corrected with Lokelma, continue dietary potassium restriction. - have stopped lasix for now    3.  Acute hypoxic respiratory failure/COPD exacerbation:  - bronchodilator therapy/inhaled corticosteroids as well as systemic corticosteroids per primary team  - note decreased respiratory reserve    4.  Hypertension - s/p titration of beta blocker earlier this week - salt tablets may not be helping this and her steroids may also be contributing - Start hydralazine 25 mg TID for now.  May be able to wean off as steroids are tapered   Continue inpatient monitoring.  Nephrology will review her labs and chart over the weekend.  Please do not hesitate to reach out to the nephrologist on call with questions      Subjective:    Feeling better today.  She was transferred to the floor.  Labs were delayed on multiple occasions.  She had another dose of tolvaptan 15 mg once on 1/30 at 12:14 pm.  She had 600 mL uop over 1/30 and had one unmeasured urine void as well.  She is glad to be working with PT here on the floor   Review of systems:   Denies  shortness of breath or chest pain  Denies n/v; appetite is better and lunch was good Not walking here yet and this worries her     Objective:   BP (!) 167/67   Pulse 79   Temp 97.7 F (36.5 C)   Resp 19   Ht 5\' 5"  (1.651 m)   Wt 56.8 kg   SpO2 96%   BMI 20.84 kg/m   Intake/Output Summary (Last 24 hours) at 06/30/2023 1138 Last data filed at 06/30/2023 0825 Gross per 24 hour  Intake 717 ml  Output 1600 ml  Net -883 ml   Weight change: 0.3 kg  Physical Exam:    General adult female in bed in no acute distress HEENT normocephalic atraumatic extraocular movements intact sclera anicteric Neck supple trachea midline Lungs clear to auscultation bilaterally normal work of breathing at rest  Heart S1S2 no rub Abdomen soft nontender nondistended Extremities no edema  Psych normal mood and affect Neuro -alert and oriented x 3 provides hx and follows commands  GU - no foley   Imaging: DG Hand 2 View Left Result Date: 06/29/2023 CLINICAL DATA:  Finger deformity EXAM: LEFT HAND - 2 VIEW COMPARISON:  None Available. FINDINGS: There is no evidence of fracture or dislocation. There is no evidence of arthropathy or other focal bone abnormality. Mild soft tissue swelling is noted in the distal aspect of the second digit. IMPRESSION: No acute fracture or dislocation is noted. Electronically  Signed   By: Alcide Clever M.D.   On: 06/29/2023 03:31   DG Ankle 2 Views Right Result Date: 06/28/2023 CLINICAL DATA:  Right ankle pain. EXAM: RIGHT ANKLE - 2 VIEW COMPARISON:  None available FINDINGS: The ankle mortise is symmetric and intact. Minimal chronic enthesopathic change at the Achilles insertion on the calcaneus. Moderate diffuse lateral greater than medial ankle soft tissue swelling. There is minimal 1 mm cortical step-off of the distal lateral aspect of the fibula. This could represent a tiny superficial acute to subacute subcortical fracture. IMPRESSION: 1. Moderate diffuse lateral greater than  medial ankle soft tissue swelling. 2. Possible tiny superficial acute to subacute subcortical fracture of the distal lateral aspect of the fibula. No displacement. Electronically Signed   By: Neita Garnet M.D.   On: 06/28/2023 18:25   DG Ankle 2 Views Left Result Date: 06/28/2023 CLINICAL DATA:  Left greater right ankle pain for months. Fall several months ago. EXAM: LEFT ANKLE - 2 VIEW COMPARISON:  None Available. FINDINGS: The ankle mortise is symmetric and intact. The ankle joint space is preserved. No acute fracture or dislocation. Mild great toe metatarsal head-sesamoid joint space narrowing and subchondral sclerosis. IMPRESSION: Mild great toe metatarsal head-sesamoid osteoarthritis. Electronically Signed   By: Neita Garnet M.D.   On: 06/28/2023 18:22     Labs: BMET Recent Labs  Lab 06/25/23 0548 06/25/23 0947 06/26/23 1703 06/26/23 2105 06/27/23 0430 06/27/23 0916 06/27/23 1249 06/27/23 1807 06/28/23 0604 06/28/23 0739 06/28/23 1533 06/28/23 1741 06/29/23 0152 06/29/23 0549 06/29/23 0955 06/29/23 1835 06/30/23 0525  NA 115*   < > 117*   < > 119*   < > 117*   < > 119* 119* 122* 121* 121* 121* 116* 122* 124*  K 3.7   < > 6.0*  --  5.6*  --  4.5  --  5.0 4.7  --   --   --  4.8  --   --  5.1  CL 81*   < > 78*  --  82*  --  77*  --  77* 78*  --   --   --  80*  --   --  83*  CO2 18*   < > 28  --  26  --  27  --  31 30  --   --   --  30  --   --  33*  GLUCOSE 380*   < > 185*  --  232*  --  212*  --  267* 246*  --   --   --  225*  --   --  139*  BUN 15   < > 50*  --  55*  --  43*  --  33* 31*  --   --   --  28*  --   --  32*  CREATININE 0.66   < > 0.86  --  0.75  --  0.83  --  0.73 0.73  --   --   --  0.69  --   --  0.86  CALCIUM 6.8*   < > 8.6*  --  8.5*  --  8.5*  --  8.5* 8.4*  --   --   --  8.5*  --   --  8.7*  PHOS 2.5  --   --   --   --   --   --   --  3.3  --   --   --   --   --   --   --   --    < > =  values in this interval not displayed.   CBC Recent Labs  Lab  06/24/23 2223 06/25/23 0548  WBC 12.2* 9.2  NEUTROABS 10.6*  --   HGB 10.3* 9.9*  HCT 28.7* 28.6*  MCV 81.8 82.2  PLT 467* 417*    Medications:     acetylcysteine  4 mL Nebulization BID   ALPRAZolam  0.25 mg Oral BID   amLODipine  10 mg Oral Daily   arformoterol  15 mcg Nebulization BID   budesonide (PULMICORT) nebulizer solution  0.5 mg Nebulization BID   calcium carbonate  1 tablet Oral Q breakfast   Chlorhexidine Gluconate Cloth  6 each Topical Daily   feeding supplement (GLUCERNA SHAKE)  237 mL Oral TID BM   insulin aspart  0-20 Units Subcutaneous TID WC   insulin aspart  0-5 Units Subcutaneous QHS   insulin aspart  5 Units Subcutaneous TID WC   insulin glargine-yfgn  25 Units Subcutaneous Daily   insulin starter kit- pen needles  1 kit Other Once   ipratropium-albuterol  3 mL Nebulization TID   metoprolol tartrate  50 mg Oral BID   polyethylene glycol  17 g Oral Daily   predniSONE  30 mg Oral Q breakfast   [START ON 07/01/2023] rosuvastatin  20 mg Oral QHS   sodium chloride  1 g Oral TID WC    Estanislado Emms, MD 06/30/2023, 12:00 PM

## 2023-06-30 NOTE — Progress Notes (Signed)
PROGRESS NOTE  Sara Stafford ZHY:[id number] DOB: May 22, 1946 DOA: 06/24/2023 PCP: Danella Penton, MD  Brief History:  78 year old female with a history of diabetes mellitus type 2, hypertension, hyperlipidemia, anxiety/depression, COPD, tobacco abuse in remission presenting with 3-week history of shortness of breath, wheezing, coughing, and chest congestion.  The patient went to see her PCP on 06/20/2023.  The patient received IM injection of Kenalog.  She was given a prescription for azithromycin and cefdinir.  She finished azithromycin.  She quit taking the cefdinir after 3 doses because she felt like it was making her symptoms worse.  She denied any fevers, chills, headache, chest pain, abdominal pain.  She has some nausea and posttussive emesis.  She had some loose stools without any hematochezia or melena.  She denies any abdominal pain, dysuria, hematuria.  She denies any hemoptysis. Unfortunately, her shortness of breath continue to worsen.  She also noted some lower extremity edema.  As result, she presented for further evaluation and treatment.  She was noted to have oxygen saturation 95% on room air by EMS.  Notably, the patient states that she has been taking sodium chloride twice daily for the past year because of low sodium.  In addition, she states that she has not taken Effexor for over 1 year.  She has only taken 3 doses of Paxil. She had a hospital admission from 07/10/2022 to 07/11/2022 secondary to hyponatremia.  She was started on sodium chloride during this hospitalization.  This was continued until the time of this admission. The patient also was hospitalized from 04/25/2022 to 04/28/2022 for hyponatremia.  She improved with normal saline and stopping her Effexor.  In the ED, the patient was afebrile hemodynamically stable with oxygen saturation 98% on 2 L. WBC 12.2, hemoglobin 10.3, platelets 467.  Sodium 115, potassium 3.6, bicarbonate 22, serum creatinine 0.50.   LFTs were unremarkable.  Chest x-ray showed increased interstitial markings.  The patient was started on normal saline, bronchodilators, and Solu-Medrol.  COVID-19 PCR is negative.  TSH 1.326.  BNP 567.  Troponin 50>> 42.      Subjective:  The patient was seen and examined this morning, no acute distress, no issues overnight Still complaining of hand and lower extremity weakness, difficulty with ambulation,  Mildly elevated blood pressure this morning 167/67, serum sodium level improved from 116 >>124 -tolerated tolvaptan yesterday  Assessment/Plan:  Acute respiratory failure with hypoxia -Resolved-remain on room air satting 96% -Multifactorial including COPD exacerbation and fluid overload -Has been successfully weaned off supplemental oxygen -Obtain VBG 7.37/43/36/24   COPD exacerbation -Improved, off supplemental oxygen, much improved shortness of breath, wheezing Satting 95% on room air -COVID-19 PCR negative -Check viral respiratory panel+rhino/enterovirus -MRSA screen negative -Check PCT 0.14 -Continue DuoNebs -Started Brovana -Started Pulmicort -Patient has approx 30-pack-year history of tobacco;, quit 20 years ago   Hyponatremia -multifactorial including fluid overload, SIADH, meds, hypovolemia with true hypoosmolar hyponatremia  -She appears clinically fluid overloaded -Continue Check sodium every 4 hours -Give Lasix IV and reassess-Per nephrology holding now  -Urine osmolarity--412 -Serum osmolarity--259 -Urine sodium 25 -06/25/23 Na = 115 >>>116 >>> 124  -appreciate nephrology consult>>add Urea BID -Nephrologist following, Continue furosemide -holding now -Tolvaptan 15 mg and today 1/29, another dose 1/30   Fluid overload -Currently euvolemic -Echo EF 65-70%, no WMA, G1DD, normal RVF -Start IV furosemide >>> switching to p.o.--on hold today per nephrology -Daily weights -Accurate I's and O's -obtain ReDS =19  Increased troponin -Secondary to demand  ischemia -Chest pain presently -Personally reviewed EKG--sinus rhythm, nonspecific T wave change -Echo EF 65-70%, no WMA, G1DD, normal RVF    Anxiety/depression -Continue home dose alprazolam -PDMP reviewed--alprazolam, 0.25 mg, #120, last refill 05/30/2023   Uncontrolled diabetes mellitus type 2 with hyperglycemia -06/25/23 A1C-9.5 -pt was on insulin drip short time 1/26 -Use resistant sliding scale -Holding metformin and glipizide -add novolog 5 units with meals   Essential hypertension -Continue amlodipine and metoprolol   Mixed hyperlipidemia -Continue statin   Leg pain/edema/debility Generalized versus -venous duplex--neg -Continue PT OT, recommending SNF for further PT OT,    Hypomagnesemia -replete       Family Communication:   no Family at bedside Consultants:  none Code Status:  FULL  DVT Prophylaxis:  Harris Lovenox     Disposition:  Anticipating SNF in 1 days-once sodium improves    procedures: As Listed in Progress Note Above Antibiotics: None Procedures: As Listed in Progress Note Above Antibiotics: None     Objective: Vitals:   06/30/23 0600 06/30/23 0730 06/30/23 0800 06/30/23 0949  BP: (!) 163/72  (!) 223/79 (!) 167/67  Pulse: 64  72 79  Resp: 18  20 19   Temp:   97.8 F (36.6 C) 97.7 F (36.5 C)  TempSrc:   Oral   SpO2: 95% 96% 100% 96%  Weight:      Height:        Intake/Output Summary (Last 24 hours) at 06/30/2023 1020 Last data filed at 06/30/2023 0825 Gross per 24 hour  Intake 717 ml  Output 1600 ml  Net -883 ml   Weight change: 0.3 kg         General:  AAO x 3,  cooperative, no distress;   HEENT:  Normocephalic, PERRL, otherwise with in Normal limits   Neuro:  CNII-XII intact. , normal motor and sensation, reflexes intact   Lungs:   Clear to auscultation BL, Respirations unlabored,  No wheezes / crackles  Cardio:    S1/S2, RRR, No murmure, No Rubs or Gallops   Abdomen:  Soft, non-tender, bowel sounds active all four  quadrants, no guarding or peritoneal signs.  Muscular  skeletal:  Still complaining of right hand, bilateral lower ankle, lower extremity weakness, difficulty with ambulation  Limited exam -global generalized weaknesses - in bed, able to move all 4 extremities,   2+ pulses,  symmetric, No pitting edema  Skin:  Dry, warm to touch, negative for any Rashes,  Wounds: Please see nursing documentation              Data Reviewed: I have personally reviewed following labs and imaging studies Basic Metabolic Panel: Recent Labs  Lab 06/25/23 0548 06/25/23 0947 06/26/23 0424 06/26/23 0900 06/27/23 1249 06/27/23 1807 06/28/23 0604 06/28/23 0739 06/28/23 1533 06/29/23 0152 06/29/23 0549 06/29/23 0955 06/29/23 1835 06/30/23 0525  NA 115*   < > 118*   < > 117*   < > 119* 119*   < > 121* 121* 116* 122* 124*  K 3.7   < > 5.1   < > 4.5  --  5.0 4.7  --   --  4.8  --   --  5.1  CL 81*   < > 80*   < > 77*  --  77* 78*  --   --  80*  --   --  83*  CO2 18*   < > 27   < > 27  --  31  30  --   --  30  --   --  33*  GLUCOSE 380*   < > 272*   < > 212*  --  267* 246*  --   --  225*  --   --  139*  BUN 15   < > 21   < > 43*  --  33* 31*  --   --  28*  --   --  32*  CREATININE 0.66   < > 0.71   < > 0.83  --  0.73 0.73  --   --  0.69  --   --  0.86  CALCIUM 6.8*   < > 7.6*   < > 8.5*  --  8.5* 8.4*  --   --  8.5*  --   --  8.7*  MG 0.8*  --  1.3*  --   --   --   --   --   --   --   --   --   --   --   PHOS 2.5  --   --   --   --   --  3.3  --   --   --   --   --   --   --    < > = values in this interval not displayed.   Liver Function Tests: Recent Labs  Lab 06/24/23 2223 06/25/23 0548 06/26/23 1243 06/28/23 0604 06/28/23 0739  AST 21 19 30   --  23  ALT 21 21 24   --  24  ALKPHOS 96 89 91  --  87  BILITOT 0.4 0.9 0.5  --  0.6  PROT 6.1* 6.1* 6.8  --  6.4*  ALBUMIN 2.7* 2.6* 3.1* 3.0* 3.0*   No results for input(s): "LIPASE", "AMYLASE" in the last 168 hours. No results for input(s):  "AMMONIA" in the last 168 hours. Coagulation Profile: No results for input(s): "INR", "PROTIME" in the last 168 hours. CBC: Recent Labs  Lab 06/24/23 2223 06/25/23 0548  WBC 12.2* 9.2  NEUTROABS 10.6*  --   HGB 10.3* 9.9*  HCT 28.7* 28.6*  MCV 81.8 82.2  PLT 467* 417*   Cardiac Enzymes: No results for input(s): "CKTOTAL", "CKMB", "CKMBINDEX", "TROPONINI" in the last 168 hours. BNP: Invalid input(s): "POCBNP" CBG: Recent Labs  Lab 06/29/23 0758 06/29/23 1132 06/29/23 1551 06/29/23 2124 06/30/23 0743  GLUCAP 251* 324* 94 215* 166*   HbA1C: No results for input(s): "HGBA1C" in the last 72 hours.  Urine analysis:    Component Value Date/Time   COLORURINE YELLOW (A) 04/25/2022 1857   APPEARANCEUR HAZY (A) 04/25/2022 1857   APPEARANCEUR Hazy (A) 01/26/2021 1309   LABSPEC 1.011 04/25/2022 1857   PHURINE 5.0 04/25/2022 1857   GLUCOSEU NEGATIVE 04/25/2022 1857   GLUCOSEU NEGATIVE 02/29/2012 1116   HGBUR NEGATIVE 04/25/2022 1857   HGBUR large 01/01/2010 1204   BILIRUBINUR NEGATIVE 04/25/2022 1857   BILIRUBINUR Negative 01/26/2021 1309   KETONESUR NEGATIVE 04/25/2022 1857   PROTEINUR NEGATIVE 04/25/2022 1857   UROBILINOGEN 0.2 02/29/2012 1116   NITRITE NEGATIVE 04/25/2022 1857   LEUKOCYTESUR NEGATIVE 04/25/2022 1857   Sepsis Labs: Reviewed @LABRCNTIP (procalcitonin:4,lacticidven:4) COVID/viral panel all negative  Scheduled Meds:  acetylcysteine  4 mL Nebulization BID   ALPRAZolam  0.25 mg Oral BID   amLODipine  10 mg Oral Daily   arformoterol  15 mcg Nebulization BID   budesonide (PULMICORT) nebulizer solution  0.5 mg Nebulization BID   calcium carbonate  1 tablet Oral Q breakfast   Chlorhexidine Gluconate Cloth  6 each Topical Daily   feeding supplement (GLUCERNA SHAKE)  237 mL Oral TID BM   insulin aspart  0-20 Units Subcutaneous TID WC   insulin aspart  0-5 Units Subcutaneous QHS   insulin aspart  5 Units Subcutaneous TID WC   insulin glargine-yfgn  25 Units  Subcutaneous Daily   insulin starter kit- pen needles  1 kit Other Once   ipratropium-albuterol  3 mL Nebulization TID   metoprolol tartrate  50 mg Oral BID   polyethylene glycol  17 g Oral Daily   predniSONE  30 mg Oral Q breakfast   [START ON 07/01/2023] rosuvastatin  20 mg Oral QHS   sodium chloride  1 g Oral TID WC   Continuous Infusions:    Procedures/Studies: DG Hand 2 View Left Result Date: 06/29/2023 CLINICAL DATA:  Finger deformity EXAM: LEFT HAND - 2 VIEW COMPARISON:  None Available. FINDINGS: There is no evidence of fracture or dislocation. There is no evidence of arthropathy or other focal bone abnormality. Mild soft tissue swelling is noted in the distal aspect of the second digit. IMPRESSION: No acute fracture or dislocation is noted. Electronically Signed   By: Alcide Clever M.D.   On: 06/29/2023 03:31   DG Ankle 2 Views Right Result Date: 06/28/2023 CLINICAL DATA:  Right ankle pain. EXAM: RIGHT ANKLE - 2 VIEW COMPARISON:  None available FINDINGS: The ankle mortise is symmetric and intact. Minimal chronic enthesopathic change at the Achilles insertion on the calcaneus. Moderate diffuse lateral greater than medial ankle soft tissue swelling. There is minimal 1 mm cortical step-off of the distal lateral aspect of the fibula. This could represent a tiny superficial acute to subacute subcortical fracture. IMPRESSION: 1. Moderate diffuse lateral greater than medial ankle soft tissue swelling. 2. Possible tiny superficial acute to subacute subcortical fracture of the distal lateral aspect of the fibula. No displacement. Electronically Signed   By: Neita Garnet M.D.   On: 06/28/2023 18:25   DG Ankle 2 Views Left Result Date: 06/28/2023 CLINICAL DATA:  Left greater right ankle pain for months. Fall several months ago. EXAM: LEFT ANKLE - 2 VIEW COMPARISON:  None Available. FINDINGS: The ankle mortise is symmetric and intact. The ankle joint space is preserved. No acute fracture or  dislocation. Mild great toe metatarsal head-sesamoid joint space narrowing and subchondral sclerosis. IMPRESSION: Mild great toe metatarsal head-sesamoid osteoarthritis. Electronically Signed   By: Neita Garnet M.D.   On: 06/28/2023 18:22   US Venous Img Lower Bilateral (DVT) Result Date: 06/25/2023 CLINICAL DATA:  Chest pain and lower extremity edema EXAM: BILATERAL LOWER EXTREMITY VENOUS DOPPLER ULTRASOUND TECHNIQUE: Gray-scale sonography with graded compression, as well as color Doppler and duplex ultrasound were performed to evaluate the lower extremity deep venous systems from the level of the common femoral vein and including the common femoral, femoral, profunda femoral, popliteal and calf veins including the posterior tibial, peroneal and gastrocnemius veins when visible. The superficial great saphenous vein was also interrogated. Spectral Doppler was utilized to evaluate flow at rest and with distal augmentation maneuvers in the common femoral, femoral and popliteal veins. COMPARISON:  None Available. FINDINGS: RIGHT LOWER EXTREMITY Common Femoral Vein: No evidence of thrombus. Normal compressibility, respiratory phasicity and response to augmentation. Saphenofemoral Junction: No evidence of thrombus. Normal compressibility and flow on color Doppler imaging. Profunda Femoral Vein: No evidence of thrombus. Normal compressibility and flow on color Doppler imaging. Femoral Vein: No evidence  of thrombus. Normal compressibility, respiratory phasicity and response to augmentation. Popliteal Vein: No evidence of thrombus. Normal compressibility, respiratory phasicity and response to augmentation. Calf Veins: No evidence of thrombus. Normal compressibility and flow on color Doppler imaging. Superficial Great Saphenous Vein: No evidence of thrombus. Normal compressibility. Venous Reflux:  None. Other Findings:  None. LEFT LOWER EXTREMITY Common Femoral Vein: No evidence of thrombus. Normal compressibility,  respiratory phasicity and response to augmentation. Saphenofemoral Junction: No evidence of thrombus. Normal compressibility and flow on color Doppler imaging. Profunda Femoral Vein: No evidence of thrombus. Normal compressibility and flow on color Doppler imaging. Femoral Vein: No evidence of thrombus. Normal compressibility, respiratory phasicity and response to augmentation. Popliteal Vein: No evidence of thrombus. Normal compressibility, respiratory phasicity and response to augmentation. Calf Veins: No evidence of thrombus. Normal compressibility and flow on color Doppler imaging. Superficial Great Saphenous Vein: No evidence of thrombus. Normal compressibility. Venous Reflux:  None. Other Findings:  None. IMPRESSION: No evidence of deep venous thrombosis in either lower extremity. Electronically Signed   By: Malachy Moan M.D.   On: 06/25/2023 11:31   CT Angio Chest Pulmonary Embolism (PE) W or WO Contrast Result Date: 06/25/2023 CLINICAL DATA:  PE suspected, positive D-dimer, shortness of breath EXAM: CT ANGIOGRAPHY CHEST WITH CONTRAST TECHNIQUE: Multidetector CT imaging of the chest was performed using the standard protocol during bolus administration of intravenous contrast. Multiplanar CT image reconstructions and MIPs were obtained to evaluate the vascular anatomy. RADIATION DOSE REDUCTION: This exam was performed according to the departmental dose-optimization program which includes automated exposure control, adjustment of the mA and/or kV according to patient size and/or use of iterative reconstruction technique. CONTRAST:  75mL OMNIPAQUE IOHEXOL 350 MG/ML SOLN COMPARISON:  05/20/2022 FINDINGS: Cardiovascular: Satisfactory opacification of the pulmonary arteries to the segmental level. No evidence of pulmonary embolism. Normal heart size. Left coronary artery calcifications. No pericardial effusion. Aortic atherosclerosis. Mediastinum/Nodes: No enlarged mediastinal, hilar, or axillary lymph  nodes. Thyroid gland, trachea, and esophagus demonstrate no significant findings. Lungs/Pleura: Small right, trace left pleural effusions. Mild centrilobular emphysema. Diffuse bilateral bronchial wall thickening. Upper Abdomen: No acute abnormality.  Status post cholecystectomy. Musculoskeletal: No chest wall abnormality. No acute osseous findings. Review of the MIP images confirms the above findings. IMPRESSION: 1. Negative examination for pulmonary embolism. 2. Small right, trace left pleural effusions. 3. Mild emphysema and diffuse bilateral bronchial wall thickening. 4. Coronary artery disease. Aortic Atherosclerosis (ICD10-I70.0). Electronically Signed   By: Jearld Lesch M.D.   On: 06/25/2023 11:22   ECHOCARDIOGRAM COMPLETE Result Date: 06/25/2023    ECHOCARDIOGRAM REPORT   Patient Name:   JAZYIAH YIU Date of Exam: 06/25/2023 Medical Rec #:  [id number]            Height:       65.0 in Accession #:    [id number]           Weight:       130.5 lb Date of Birth:  Dec 19, 1945            BSA:          1.650 m Patient Age:    77 years             BP:           188/98 mmHg Patient Gender: F                    HR:           106  bpm. Exam Location:  Inpatient Procedure: 2D Echo, Cardiac Doppler, Color Doppler and Intracardiac            Opacification Agent Indications:    CHF Acute Systolic  History:        Patient has prior history of Echocardiogram examinations and                 Patient has no prior history of Echocardiogram examinations.                 Risk Factors:Hypertension, Diabetes, Dyslipidemia and Former                 Smoker.  Sonographer:    Karma Ganja Referring Phys: [id number] OLADAPO ADEFESO  Sonographer Comments: Technically challenging study due to limited acoustic windows. Image acquisition challenging due to patient body habitus. IMPRESSIONS  1. Left ventricular ejection fraction, by estimation, is 65 to 70%. The left ventricle has normal function. The left ventricle has no regional wall  motion abnormalities. Left ventricular diastolic parameters are consistent with Grade I diastolic dysfunction (impaired relaxation). Elevated left atrial pressure.  2. Right ventricular systolic function is normal. The right ventricular size is normal.  3. The mitral valve is normal in structure. Trivial mitral valve regurgitation. No evidence of mitral stenosis.  4. The aortic valve was not well visualized. Aortic valve regurgitation is not visualized. No aortic stenosis is present.  5. The inferior vena cava is normal in size with greater than 50% respiratory variability, suggesting right atrial pressure of 3 mmHg. Comparison(s): No prior Echocardiogram. FINDINGS  Left Ventricle: Left ventricular ejection fraction, by estimation, is 65 to 70%. The left ventricle has normal function. The left ventricle has no regional wall motion abnormalities. Definity contrast agent was given IV to delineate the left ventricular  endocardial borders. The left ventricular internal cavity size was normal in size. There is no left ventricular hypertrophy. Left ventricular diastolic parameters are consistent with Grade I diastolic dysfunction (impaired relaxation). Elevated left atrial pressure. Right Ventricle: The right ventricular size is normal. Right ventricular systolic function is normal. Left Atrium: Left atrial size was normal in size. Right Atrium: Right atrial size was normal in size. Pericardium: There is no evidence of pericardial effusion. Mitral Valve: The mitral valve is normal in structure. Trivial mitral valve regurgitation. No evidence of mitral valve stenosis. Tricuspid Valve: The tricuspid valve is normal in structure. Tricuspid valve regurgitation is not demonstrated. No evidence of tricuspid stenosis. Aortic Valve: The aortic valve was not well visualized. Aortic valve regurgitation is not visualized. No aortic stenosis is present. Aortic valve mean gradient measures 4.0 mmHg. Aortic valve peak gradient  measures 7.4 mmHg. Pulmonic Valve: The pulmonic valve was not well visualized. Pulmonic valve regurgitation is not visualized. No evidence of pulmonic stenosis. Aorta: The aortic root is normal in size and structure. Venous: The inferior vena cava is normal in size with greater than 50% respiratory variability, suggesting right atrial pressure of 3 mmHg. IAS/Shunts: No atrial level shunt detected by color flow Doppler.  LEFT VENTRICLE PLAX 2D LVIDd:         4.50 cm     Diastology LVIDs:         3.50 cm     LV e' medial:    6.42 cm/s LV PW:         0.70 cm     LV E/e' medial:  17.0 LV IVS:        0.80 cm  LV e' lateral:   10.10 cm/s LVOT diam:     1.80 cm     LV E/e' lateral: 10.8 LV SV:         59 LV SV Index:   36 LVOT Area:     2.54 cm  LV Volumes (MOD) LV vol d, MOD A2C: 39.1 ml LV vol d, MOD A4C: 50.3 ml LV vol s, MOD A2C: 13.0 ml LV vol s, MOD A4C: 23.9 ml LV SV MOD A2C:     26.1 ml LV SV MOD A4C:     50.3 ml LV SV MOD BP:      28.2 ml RIGHT VENTRICLE            IVC RV S prime:     7.40 cm/s  IVC diam: 2.10 cm LEFT ATRIUM             Index        RIGHT ATRIUM          Index LA diam:        3.30 cm 2.00 cm/m   RA Area:     9.49 cm LA Vol (A2C):   43.9 ml 26.61 ml/m  RA Volume:   20.80 ml 12.61 ml/m LA Vol (A4C):   33.6 ml 20.36 ml/m LA Biplane Vol: 38.6 ml 23.39 ml/m  AORTIC VALVE AV Area (Vmax):    2.34 cm AV Area (Vmean):   2.12 cm AV Area (VTI):     2.50 cm AV Vmax:           136.00 cm/s AV Vmean:          90.500 cm/s AV VTI:            0.236 m AV Peak Grad:      7.4 mmHg AV Mean Grad:      4.0 mmHg LVOT Vmax:         125.00 cm/s LVOT Vmean:        75.500 cm/s LVOT VTI:          0.232 m LVOT/AV VTI ratio: 0.98  AORTA Ao Root diam: 2.60 cm MITRAL VALVE MV Area (PHT): 7.82 cm     SHUNTS MV Decel Time: 97 msec      Systemic VTI:  0.23 m MR Peak grad: 132.7 mmHg    Systemic Diam: 1.80 cm MR Vmax:      576.00 cm/s MV E velocity: 109.00 cm/s MV A velocity: 117.00 cm/s MV E/A ratio:  0.93 Olga Millers MD Electronically signed by Olga Millers MD Signature Date/Time: 06/25/2023/11:20:12 AM    Final    DG Chest 2 View Result Date: 06/24/2023 CLINICAL DATA:  Cough and dyspnea EXAM: CHEST - 2 VIEW COMPARISON:  None Available. FINDINGS: There are increased central interstitial markings bilaterally. There is no lung consolidation, pleural effusion or pneumothorax. The cardiomediastinal silhouette is within normal limits. No acute fractures are seen. IMPRESSION: Increased central interstitial markings bilaterally may reflect atypical/viral infection. No lung consolidation. Electronically Signed   By: Darliss Cheney M.D.   On: 06/24/2023 22:58    Kendell Bane, MD  [facility]  Total of 65 minutes of critical time was spent evaluating and examining the patient, reviewing all medical records, current medication discussion with ICU staff and nephrologist.    If 7PM-7AM, please contact night-coverage [email or web address] Password [email or web address] 06/30/2023, 10:20 AM   LOS: 6 days

## 2023-06-30 NOTE — Care Management Important Message (Signed)
Important Message  Patient Details  Name: Sara Stafford MRN: [id number] Date of Birth: 16-Sep-1945   Important Message Given:  Yes - Medicare IM     Corey Harold 06/30/2023, 3:01 PM

## 2023-06-30 NOTE — Plan of Care (Signed)
  Problem: Education: Goal: Knowledge of General Education information will improve Description: Including pain rating scale, medication(s)/side effects and non-pharmacologic comfort measures Outcome: Progressing   Problem: Clinical Measurements: Goal: Respiratory complications will improve Outcome: Progressing   Problem: Nutrition: Goal: Adequate nutrition will be maintained Outcome: Progressing   Problem: Coping: Goal: Level of anxiety will decrease Outcome: Progressing   Problem: Safety: Goal: Ability to remain free from injury will improve Outcome: Progressing   

## 2023-06-30 NOTE — TOC Progression Note (Signed)
Transition of Care [facility]) - Progression Note    Patient Details  Name: Sara Stafford MRN: [id number] Date of Birth: [birth year]  [facility]) CM/SW Contact  Villa Her[place], [place] Phone Number: 06/30/2023, 10:56 AM  Clinical Narrative:    Pts insurance Berkley Harvey has been approved for SNF at [facility] once medically stable. CSW spoke to [place] with [facility] who will come to hospital to complete admission paperwork with pt at bedside. Lynnea Ferrier states they are able to accept pt over the weekend if needed. TOC to follow.   Expected Discharge Plan: Home w Home Health Services Barriers to Discharge: Continued Medical Work up  Expected Discharge Plan and Services In-house Referral: Clinical Social Work Discharge Planning Services: CM Consult Post Acute Care Choice: Home Health Living arrangements for the past 2 months: Single Family Home                                       Social Determinants of Health (SDOH) Interventions SDOH Screenings   Food Insecurity: No Food Insecurity (06/25/2023)  Housing: Low Risk  (06/25/2023)  Transportation Needs: No Transportation Needs (06/25/2023)  Utilities: Not At Risk (06/25/2023)  Financial Resource Strain: Low Risk  (05/05/2023)   Received from [facility]  Social Connections: Moderately Isolated (06/25/2023)  Tobacco Use: Medium Risk (06/25/2023)    Readmission Risk Interventions    06/27/2023    3:33 PM  Readmission Risk Prevention Plan  Transportation Screening Complete  HRI or Home Care Consult Complete  Social Work Consult for Recovery Care Planning/Counseling Complete  Palliative Care Screening Not Applicable  Medication Review Oceanographer) Complete

## 2023-07-01 DIAGNOSIS — E871 Hypo-osmolality and hyponatremia: Secondary | ICD-10-CM | POA: Diagnosis not present

## 2023-07-01 LAB — BASIC METABOLIC PANEL
Anion gap: 8 (ref 5–15)
BUN: 29 mg/dL — ABNORMAL HIGH (ref 8–23)
CO2: 27 mmol/L (ref 22–32)
Calcium: 8.1 mg/dL — ABNORMAL LOW (ref 8.9–10.3)
Chloride: 82 mmol/L — ABNORMAL LOW (ref 98–111)
Creatinine, Ser: 0.68 mg/dL (ref 0.44–1.00)
GFR, Estimated: >60 mL/min (ref >60)
Glucose, Bld: 76 mg/dL (ref 70–99)
Potassium: 4.2 mmol/L (ref 3.5–5.1)
Sodium: 117 mmol/L — CL (ref 135–145)

## 2023-07-01 LAB — GLUCOSE, CAPILLARY
Glucose-Capillary: 96 mg/dL (ref 70–99)
Glucose-Capillary: 223 mg/dL — ABNORMAL HIGH (ref 70–99)
Glucose-Capillary: 140 mg/dL — ABNORMAL HIGH (ref 70–99)
Glucose-Capillary: 219 mg/dL — ABNORMAL HIGH (ref 70–99)

## 2023-07-01 LAB — SODIUM
Sodium: 115 mmol/L — CL (ref 135–145)
Sodium: 122 mmol/L — ABNORMAL LOW (ref 135–145)
Sodium: 121 mmol/L — ABNORMAL LOW (ref 135–145)
Sodium: 119 mmol/L — CL (ref 135–145)

## 2023-07-01 MED ORDER — SENNOSIDES-DOCUSATE SODIUM 8.6-50 MG PO TABS
1.0000 | ORAL_TABLET | Freq: Two times a day (BID) | ORAL | Status: DC
  Administered 2023-07-01 – 2023-07-03 (×5): 1 via ORAL
  Filled 2023-07-01 (×5): qty 1

## 2023-07-01 MED ORDER — TOLVAPTAN 15 MG PO TABS: 15.0000 mg | ORAL_TABLET | Freq: Once | ORAL | Status: DC

## 2023-07-01 MED ORDER — IPRATROPIUM-ALBUTEROL 0.5-2.5 (3) MG/3ML IN SOLN: 3.0000 mL | RESPIRATORY_TRACT | Status: DC | PRN

## 2023-07-01 MED ORDER — LOSARTAN POTASSIUM 25 MG PO TABS: 25.0000 mg | ORAL_TABLET | Freq: Every day | ORAL | Status: DC

## 2023-07-01 MED ORDER — SODIUM CHLORIDE 3 % IV SOLN
INTRAVENOUS | Status: DC
  Filled 2023-07-01 (×4): qty 500

## 2023-07-01 MED ORDER — SODIUM CHLORIDE 3 % IV SOLN
INTRAVENOUS | Status: DC
  Administered 2023-07-01 (×2): 50 mL/h via INTRAVENOUS
  Filled 2023-07-01 (×4): qty 500

## 2023-07-01 NOTE — Progress Notes (Signed)
PROGRESS NOTE  Sara Stafford NGE:[id number] DOB: Aug 26, 1945 DOA: 06/24/2023 PCP: Danella Penton, MD  Brief History:  78 year old female with a history of diabetes mellitus type 2, hypertension, hyperlipidemia, anxiety/depression, COPD, tobacco abuse in remission presenting with 3-week history of shortness of breath, wheezing, coughing, and chest congestion.  The patient went to see her PCP on 06/20/2023.  The patient received IM injection of Kenalog.  She was given a prescription for azithromycin and cefdinir.  She finished azithromycin.  She quit taking the cefdinir after 3 doses because she felt like it was making her symptoms worse.  She denied any fevers, chills, headache, chest pain, abdominal pain.  She has some nausea and posttussive emesis.  She had some loose stools without any hematochezia or melena.  She denies any abdominal pain, dysuria, hematuria.  She denies any hemoptysis. Unfortunately, her shortness of breath continue to worsen.  She also noted some lower extremity edema.  As result, she presented for further evaluation and treatment.  She was noted to have oxygen saturation 95% on room air by EMS.  Notably, the patient states that she has been taking sodium chloride twice daily for the past year because of low sodium.  In addition, she states that she has not taken Effexor for over 1 year.  She has only taken 3 doses of Paxil. She had a hospital admission from 07/10/2022 to 07/11/2022 secondary to hyponatremia.  She was started on sodium chloride during this hospitalization.  This was continued until the time of this admission. The patient also was hospitalized from 04/25/2022 to 04/28/2022 for hyponatremia.  She improved with normal saline and stopping her Effexor.  In the ED, the patient was afebrile hemodynamically stable with oxygen saturation 98% on 2 L. WBC 12.2, hemoglobin 10.3, platelets 467.  Sodium 115, potassium 3.6, bicarbonate 22, serum creatinine 0.50.   LFTs were unremarkable.  Chest x-ray showed increased interstitial markings.  The patient was started on normal saline, bronchodilators, and Solu-Medrol.  COVID-19 PCR is negative.  TSH 1.326.  BNP 567.  Troponin 50>> 42.      Subjective:  The patient was seen and examined this morning.  Awake alert, no new focal neurological findings  We were alerted that her serum sodium dipped down to 117, 150 Overnight provider initiated hypertonic saline Transfer back to ICU   Assessment/Plan:  Hyponatremia:  -multifactorial including fluid overload, SIADH, meds, hypovolemia with true hypoosmolar hyponatremia  -She appears clinically fluid overloaded -Continue Check sodium every 4 hours -Give Lasix IV and reassess- still Holding Per Nephro   -Urine osmolarity--412 -Serum osmolarity--259 -Urine sodium 25 -06/25/23 Na = 115 >>>116 >>> 124 >>>117, 115 today   -appreciate nephrology consult>>add Urea BID -Nephrologist following, Continue furosemide -holding now -Tolvaptan 15 mg and today 1/29, another dose 1/30 -07/01/2023 -started hypertonic saline  -Transferring back to ICU for close observation, neurochecks   Acute respiratory failure with hypoxia -Resolved - remain on room air, satting 94% -Multifactorial including COPD exacerbation and fluid overload -Has been successfully weaned off supplemental oxygen -Obtain VBG 7.37/43/36/24   COPD exacerbation -Improved, off supplemental oxygen, much improved  -On room air now -COVID-19 PCR negative -viral respiratory panel>> ++ Rhino/Enterovirus -MRSA screen negative -PCT 0.14 -Continue PRN DuoNebs -Started Brovana -Started Pulmicort -Patient has approx 30-pack-year history of tobacco;, quit 20 years ago    Fluid overload -Currently euvolemic -Echo EF 65-70%, no WMA, G1DD, normal RVF -Start IV furosemide >>> switching to  p.o.--on hold today per nephrology -Daily weights -Accurate I's and O's -obtain ReDS =19   Increased  troponin -Secondary to demand ischemia -Chest pain presently -Personally reviewed EKG--sinus rhythm, nonspecific T wave change -Echo EF 65-70%, no WMA, G1DD, normal RVF    Anxiety/depression -Continue home dose alprazolam -PDMP reviewed--alprazolam, 0.25 mg, #120, last refill 05/30/2023   Uncontrolled diabetes mellitus type 2 with hyperglycemia -06/25/23 A1C-9.5 -pt was on insulin drip short time 1/26 -Use resistant sliding scale -Holding metformin and glipizide -add novolog 5 units with meals   Essential hypertension -Continue amlodipine and metoprolol   Mixed hyperlipidemia -Continue statin   Leg pain/edema/debility Generalized versus -venous duplex--neg -Continue PT OT, recommending SNF for further PT OT,    Hypomagnesemia -replete       Family Communication:   no Family at bedside Consultants:  none Code Status:  FULL  DVT Prophylaxis:  Crystal Lovenox     Disposition:  Anticipating SNF in 1 days-once sodium improves    procedures: As Listed in Progress Note Above Antibiotics: None Procedures: As Listed in Progress Note Above Antibiotics: None     Objective: Vitals:   06/30/23 2123 06/30/23 2124 07/01/23 0535 07/01/23 0805  BP:  111/78 (!) 156/67   Pulse:  81 72   Resp:  18 16   Temp:  98.2 F (36.8 C) 98.1 F (36.7 C)   TempSrc:  Oral Oral   SpO2: 96% 97% 97% 94%  Weight:   57.5 kg   Height:        Intake/Output Summary (Last 24 hours) at 07/01/2023 1108 Last data filed at 07/01/2023 0450 Gross per 24 hour  Intake 357 ml  Output 500 ml  Net -143 ml   Weight change: 0.7 kg       General:  AAO x 3,  cooperative, no distress;   HEENT:  Normocephalic, PERRL, otherwise with in Normal limits   Neuro:  CNII-XII intact. , normal motor and sensation, reflexes intact   Lungs:   Clear to auscultation BL, Respirations unlabored,  No wheezes / crackles  Cardio:    S1/S2, RRR, No murmure, No Rubs or Gallops   Abdomen:  Soft, non-tender, bowel sounds  active all four quadrants, no guarding or peritoneal signs.  Muscular  skeletal:  Bilateral lower extremity weakness with subjective numbness  Difficulty ambulating Limited exam -global generalized weaknesses - in bed, able to move all 4 extremities,   2+ pulses,  symmetric, No pitting edema  Skin:  Dry, warm to touch, negative for any Rashes,  Wounds: Please see nursing documentation             Data Reviewed: I have personally reviewed following labs and imaging studies Basic Metabolic Panel: Recent Labs  Lab 06/25/23 0548 06/25/23 0947 06/26/23 0424 06/26/23 0900 06/28/23 0604 06/28/23 0739 06/28/23 1533 06/29/23 0549 06/29/23 0955 06/29/23 1835 06/30/23 0525 07/01/23 0522 07/01/23 1000  NA 115*   < > 118*   < > 119* 119*   < > 121* 116* 122* 124* 117* 115*  K 3.7   < > 5.1   < > 5.0 4.7  --  4.8  --   --  5.1 4.2  --   CL 81*   < > 80*   < > 77* 78*  --  80*  --   --  83* 82*  --   CO2 18*   < > 27   < > 31 30  --  30  --   --  33* 27  --   GLUCOSE 380*   < > 272*   < > 267* 246*  --  225*  --   --  139* 76  --   BUN 15   < > 21   < > 33* 31*  --  28*  --   --  32* 29*  --   CREATININE 0.66   < > 0.71   < > 0.73 0.73  --  0.69  --   --  0.86 0.68  --   CALCIUM 6.8*   < > 7.6*   < > 8.5* 8.4*  --  8.5*  --   --  8.7* 8.1*  --   MG 0.8*  --  1.3*  --   --   --   --   --   --   --   --   --   --   PHOS 2.5  --   --   --  3.3  --   --   --   --   --   --   --   --    < > = values in this interval not displayed.   Liver Function Tests: Recent Labs  Lab 06/24/23 2223 06/25/23 0548 06/26/23 1243 06/28/23 0604 06/28/23 0739  AST 21 19 30   --  23  ALT 21 21 24   --  24  ALKPHOS 96 89 91  --  87  BILITOT 0.4 0.9 0.5  --  0.6  PROT 6.1* 6.1* 6.8  --  6.4*  ALBUMIN 2.7* 2.6* 3.1* 3.0* 3.0*   No results for input(s): "LIPASE", "AMYLASE" in the last 168 hours. No results for input(s): "AMMONIA" in the last 168 hours. Coagulation Profile: No results for input(s):  "INR", "PROTIME" in the last 168 hours. CBC: Recent Labs  Lab 06/24/23 2223 06/25/23 0548  WBC 12.2* 9.2  NEUTROABS 10.6*  --   HGB 10.3* 9.9*  HCT 28.7* 28.6*  MCV 81.8 82.2  PLT 467* 417*   Cardiac Enzymes: No results for input(s): "CKTOTAL", "CKMB", "CKMBINDEX", "TROPONINI" in the last 168 hours. BNP: Invalid input(s): "POCBNP" CBG: Recent Labs  Lab 06/30/23 0743 06/30/23 1402 06/30/23 1555 06/30/23 2039 07/01/23 0721  GLUCAP 166* 346* 311* 161* 96   HbA1C: No results for input(s): "HGBA1C" in the last 72 hours.  Urine analysis:    Component Value Date/Time   COLORURINE YELLOW (A) 04/25/2022 1857   APPEARANCEUR HAZY (A) 04/25/2022 1857   APPEARANCEUR Hazy (A) 01/26/2021 1309   LABSPEC 1.011 04/25/2022 1857   PHURINE 5.0 04/25/2022 1857   GLUCOSEU NEGATIVE 04/25/2022 1857   GLUCOSEU NEGATIVE 02/29/2012 1116   HGBUR NEGATIVE 04/25/2022 1857   HGBUR large 01/01/2010 1204   BILIRUBINUR NEGATIVE 04/25/2022 1857   BILIRUBINUR Negative 01/26/2021 1309   KETONESUR NEGATIVE 04/25/2022 1857   PROTEINUR NEGATIVE 04/25/2022 1857   UROBILINOGEN 0.2 02/29/2012 1116   NITRITE NEGATIVE 04/25/2022 1857   LEUKOCYTESUR NEGATIVE 04/25/2022 1857   Sepsis Labs: Reviewed @LABRCNTIP (procalcitonin:4,lacticidven:4) COVID/viral panel all negative  Scheduled Meds:  ALPRAZolam  0.25 mg Oral BID   amLODipine  10 mg Oral Daily   arformoterol  15 mcg Nebulization BID   budesonide (PULMICORT) nebulizer solution  0.5 mg Nebulization BID   calcium carbonate  1 tablet Oral Q breakfast   Chlorhexidine Gluconate Cloth  6 each Topical Daily   hydrALAZINE  25 mg Oral Q8H   insulin aspart  0-20 Units Subcutaneous TID WC  insulin aspart  0-5 Units Subcutaneous QHS   insulin aspart  5 Units Subcutaneous TID WC   insulin glargine-yfgn  25 Units Subcutaneous Daily   insulin starter kit- pen needles  1 kit Other Once   ipratropium-albuterol  3 mL Nebulization TID   metoprolol tartrate  50  mg Oral BID   polyethylene glycol  17 g Oral Daily   predniSONE  20 mg Oral Q breakfast   rosuvastatin  20 mg Oral QHS   sodium chloride  1 g Oral TID WC   Continuous Infusions:  sodium chloride (hypertonic) 50 mL/hr (07/01/23 1044)     Procedures/Studies: DG Hand 2 View Left Result Date: 06/29/2023 CLINICAL DATA:  Finger deformity EXAM: LEFT HAND - 2 VIEW COMPARISON:  None Available. FINDINGS: There is no evidence of fracture or dislocation. There is no evidence of arthropathy or other focal bone abnormality. Mild soft tissue swelling is noted in the distal aspect of the second digit. IMPRESSION: No acute fracture or dislocation is noted. Electronically Signed   By: Alcide Clever M.D.   On: 06/29/2023 03:31   DG Ankle 2 Views Right Result Date: 06/28/2023 CLINICAL DATA:  Right ankle pain. EXAM: RIGHT ANKLE - 2 VIEW COMPARISON:  None available FINDINGS: The ankle mortise is symmetric and intact. Minimal chronic enthesopathic change at the Achilles insertion on the calcaneus. Moderate diffuse lateral greater than medial ankle soft tissue swelling. There is minimal 1 mm cortical step-off of the distal lateral aspect of the fibula. This could represent a tiny superficial acute to subacute subcortical fracture. IMPRESSION: 1. Moderate diffuse lateral greater than medial ankle soft tissue swelling. 2. Possible tiny superficial acute to subacute subcortical fracture of the distal lateral aspect of the fibula. No displacement. Electronically Signed   By: Neita Garnet M.D.   On: 06/28/2023 18:25   DG Ankle 2 Views Left Result Date: 06/28/2023 CLINICAL DATA:  Left greater right ankle pain for months. Fall several months ago. EXAM: LEFT ANKLE - 2 VIEW COMPARISON:  None Available. FINDINGS: The ankle mortise is symmetric and intact. The ankle joint space is preserved. No acute fracture or dislocation. Mild great toe metatarsal head-sesamoid joint space narrowing and subchondral sclerosis. IMPRESSION: Mild  great toe metatarsal head-sesamoid osteoarthritis. Electronically Signed   By: Neita Garnet M.D.   On: 06/28/2023 18:22   US Venous Img Lower Bilateral (DVT) Result Date: 06/25/2023 CLINICAL DATA:  Chest pain and lower extremity edema EXAM: BILATERAL LOWER EXTREMITY VENOUS DOPPLER ULTRASOUND TECHNIQUE: Gray-scale sonography with graded compression, as well as color Doppler and duplex ultrasound were performed to evaluate the lower extremity deep venous systems from the level of the common femoral vein and including the common femoral, femoral, profunda femoral, popliteal and calf veins including the posterior tibial, peroneal and gastrocnemius veins when visible. The superficial great saphenous vein was also interrogated. Spectral Doppler was utilized to evaluate flow at rest and with distal augmentation maneuvers in the common femoral, femoral and popliteal veins. COMPARISON:  None Available. FINDINGS: RIGHT LOWER EXTREMITY Common Femoral Vein: No evidence of thrombus. Normal compressibility, respiratory phasicity and response to augmentation. Saphenofemoral Junction: No evidence of thrombus. Normal compressibility and flow on color Doppler imaging. Profunda Femoral Vein: No evidence of thrombus. Normal compressibility and flow on color Doppler imaging. Femoral Vein: No evidence of thrombus. Normal compressibility, respiratory phasicity and response to augmentation. Popliteal Vein: No evidence of thrombus. Normal compressibility, respiratory phasicity and response to augmentation. Calf Veins: No evidence of thrombus. Normal compressibility and  flow on color Doppler imaging. Superficial Great Saphenous Vein: No evidence of thrombus. Normal compressibility. Venous Reflux:  None. Other Findings:  None. LEFT LOWER EXTREMITY Common Femoral Vein: No evidence of thrombus. Normal compressibility, respiratory phasicity and response to augmentation. Saphenofemoral Junction: No evidence of thrombus. Normal compressibility  and flow on color Doppler imaging. Profunda Femoral Vein: No evidence of thrombus. Normal compressibility and flow on color Doppler imaging. Femoral Vein: No evidence of thrombus. Normal compressibility, respiratory phasicity and response to augmentation. Popliteal Vein: No evidence of thrombus. Normal compressibility, respiratory phasicity and response to augmentation. Calf Veins: No evidence of thrombus. Normal compressibility and flow on color Doppler imaging. Superficial Great Saphenous Vein: No evidence of thrombus. Normal compressibility. Venous Reflux:  None. Other Findings:  None. IMPRESSION: No evidence of deep venous thrombosis in either lower extremity. Electronically Signed   By: Malachy Moan M.D.   On: 06/25/2023 11:31   CT Angio Chest Pulmonary Embolism (PE) W or WO Contrast Result Date: 06/25/2023 CLINICAL DATA:  PE suspected, positive D-dimer, shortness of breath EXAM: CT ANGIOGRAPHY CHEST WITH CONTRAST TECHNIQUE: Multidetector CT imaging of the chest was performed using the standard protocol during bolus administration of intravenous contrast. Multiplanar CT image reconstructions and MIPs were obtained to evaluate the vascular anatomy. RADIATION DOSE REDUCTION: This exam was performed according to the departmental dose-optimization program which includes automated exposure control, adjustment of the mA and/or kV according to patient size and/or use of iterative reconstruction technique. CONTRAST:  75mL OMNIPAQUE IOHEXOL 350 MG/ML SOLN COMPARISON:  05/20/2022 FINDINGS: Cardiovascular: Satisfactory opacification of the pulmonary arteries to the segmental level. No evidence of pulmonary embolism. Normal heart size. Left coronary artery calcifications. No pericardial effusion. Aortic atherosclerosis. Mediastinum/Nodes: No enlarged mediastinal, hilar, or axillary lymph nodes. Thyroid gland, trachea, and esophagus demonstrate no significant findings. Lungs/Pleura: Small right, trace left pleural  effusions. Mild centrilobular emphysema. Diffuse bilateral bronchial wall thickening. Upper Abdomen: No acute abnormality.  Status post cholecystectomy. Musculoskeletal: No chest wall abnormality. No acute osseous findings. Review of the MIP images confirms the above findings. IMPRESSION: 1. Negative examination for pulmonary embolism. 2. Small right, trace left pleural effusions. 3. Mild emphysema and diffuse bilateral bronchial wall thickening. 4. Coronary artery disease. Aortic Atherosclerosis (ICD10-I70.0). Electronically Signed   By: Jearld Lesch M.D.   On: 06/25/2023 11:22   ECHOCARDIOGRAM COMPLETE Result Date: 06/25/2023    ECHOCARDIOGRAM REPORT   Patient Name:   Sara Stafford Date of Exam: 06/25/2023 Medical Rec #:  [id number]            Height:       65.0 in Accession #:    [id number]           Weight:       130.5 lb Date of Birth:  [birth year]            BSA:          1.650 m Patient Age:    77 years             BP:           188/98 mmHg Patient Gender: F                    HR:           106 bpm. Exam Location:  Inpatient Procedure: 2D Echo, Cardiac Doppler, Color Doppler and Intracardiac            Opacification Agent Indications:    CHF Acute  Systolic  History:        Patient has prior history of Echocardiogram examinations and                 Patient has no prior history of Echocardiogram examinations.                 Risk Factors:Hypertension, Diabetes, Dyslipidemia and Former                 Smoker.  Sonographer:    Karma Ganja Referring Phys: [id number] OLADAPO ADEFESO  Sonographer Comments: Technically challenging study due to limited acoustic windows. Image acquisition challenging due to patient body habitus. IMPRESSIONS  1. Left ventricular ejection fraction, by estimation, is 65 to 70%. The left ventricle has normal function. The left ventricle has no regional wall motion abnormalities. Left ventricular diastolic parameters are consistent with Grade I diastolic dysfunction (impaired  relaxation). Elevated left atrial pressure.  2. Right ventricular systolic function is normal. The right ventricular size is normal.  3. The mitral valve is normal in structure. Trivial mitral valve regurgitation. No evidence of mitral stenosis.  4. The aortic valve was not well visualized. Aortic valve regurgitation is not visualized. No aortic stenosis is present.  5. The inferior vena cava is normal in size with greater than 50% respiratory variability, suggesting right atrial pressure of 3 mmHg. Comparison(s): No prior Echocardiogram. FINDINGS  Left Ventricle: Left ventricular ejection fraction, by estimation, is 65 to 70%. The left ventricle has normal function. The left ventricle has no regional wall motion abnormalities. Definity contrast agent was given IV to delineate the left ventricular  endocardial borders. The left ventricular internal cavity size was normal in size. There is no left ventricular hypertrophy. Left ventricular diastolic parameters are consistent with Grade I diastolic dysfunction (impaired relaxation). Elevated left atrial pressure. Right Ventricle: The right ventricular size is normal. Right ventricular systolic function is normal. Left Atrium: Left atrial size was normal in size. Right Atrium: Right atrial size was normal in size. Pericardium: There is no evidence of pericardial effusion. Mitral Valve: The mitral valve is normal in structure. Trivial mitral valve regurgitation. No evidence of mitral valve stenosis. Tricuspid Valve: The tricuspid valve is normal in structure. Tricuspid valve regurgitation is not demonstrated. No evidence of tricuspid stenosis. Aortic Valve: The aortic valve was not well visualized. Aortic valve regurgitation is not visualized. No aortic stenosis is present. Aortic valve mean gradient measures 4.0 mmHg. Aortic valve peak gradient measures 7.4 mmHg. Pulmonic Valve: The pulmonic valve was not well visualized. Pulmonic valve regurgitation is not visualized.  No evidence of pulmonic stenosis. Aorta: The aortic root is normal in size and structure. Venous: The inferior vena cava is normal in size with greater than 50% respiratory variability, suggesting right atrial pressure of 3 mmHg. IAS/Shunts: No atrial level shunt detected by color flow Doppler.  LEFT VENTRICLE PLAX 2D LVIDd:         4.50 cm     Diastology LVIDs:         3.50 cm     LV e' medial:    6.42 cm/s LV PW:         0.70 cm     LV E/e' medial:  17.0 LV IVS:        0.80 cm     LV e' lateral:   10.10 cm/s LVOT diam:     1.80 cm     LV E/e' lateral: 10.8 LV SV:  59 LV SV Index:   36 LVOT Area:     2.54 cm  LV Volumes (MOD) LV vol d, MOD A2C: 39.1 ml LV vol d, MOD A4C: 50.3 ml LV vol s, MOD A2C: 13.0 ml LV vol s, MOD A4C: 23.9 ml LV SV MOD A2C:     26.1 ml LV SV MOD A4C:     50.3 ml LV SV MOD BP:      28.2 ml RIGHT VENTRICLE            IVC RV S prime:     7.40 cm/s  IVC diam: 2.10 cm LEFT ATRIUM             Index        RIGHT ATRIUM          Index LA diam:        3.30 cm 2.00 cm/m   RA Area:     9.49 cm LA Vol (A2C):   43.9 ml 26.61 ml/m  RA Volume:   20.80 ml 12.61 ml/m LA Vol (A4C):   33.6 ml 20.36 ml/m LA Biplane Vol: 38.6 ml 23.39 ml/m  AORTIC VALVE AV Area (Vmax):    2.34 cm AV Area (Vmean):   2.12 cm AV Area (VTI):     2.50 cm AV Vmax:           136.00 cm/s AV Vmean:          90.500 cm/s AV VTI:            0.236 m AV Peak Grad:      7.4 mmHg AV Mean Grad:      4.0 mmHg LVOT Vmax:         125.00 cm/s LVOT Vmean:        75.500 cm/s LVOT VTI:          0.232 m LVOT/AV VTI ratio: 0.98  AORTA Ao Root diam: 2.60 cm MITRAL VALVE MV Area (PHT): 7.82 cm     SHUNTS MV Decel Time: 97 msec      Systemic VTI:  0.23 m MR Peak grad: 132.7 mmHg    Systemic Diam: 1.80 cm MR Vmax:      576.00 cm/s MV E velocity: 109.00 cm/s MV A velocity: 117.00 cm/s MV E/A ratio:  0.93 Olga Millers MD Electronically signed by Olga Millers MD Signature Date/Time: 06/25/2023/11:20:12 AM    Final    DG Chest 2  View Result Date: 06/24/2023 CLINICAL DATA:  Cough and dyspnea EXAM: CHEST - 2 VIEW COMPARISON:  None Available. FINDINGS: There are increased central interstitial markings bilaterally. There is no lung consolidation, pleural effusion or pneumothorax. The cardiomediastinal silhouette is within normal limits. No acute fractures are seen. IMPRESSION: Increased central interstitial markings bilaterally may reflect atypical/viral infection. No lung consolidation. Electronically Signed   By: Darliss Cheney M.D.   On: 06/24/2023 22:58    Kendell Bane, MD  [facility]  Total of 65 minutes of critical time was spent evaluating and examining the patient, reviewing all medical records, current medication discussion with ICU staff and nephrologist.    If 7PM-7AM, please contact night-coverage [email or web address] Password [email or web address] 07/01/2023, 11:08 AM   LOS: 7 days

## 2023-07-01 NOTE — Progress Notes (Signed)
Patient ID: Sara Stafford, female   DOB: 1945/11/03, 78 y.o.   MRN: [id number] [facility] Progress Note   Assessment/ Plan:   1.  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  Urine sodium of 39 and urine osmolality of 420 suggestive that this is where.  Has received tolvaptan several times.  Somewhat improved and then worsens again.  Mild symptoms on exam today. -Continue oral salt tabs, 1 g 3 times daily -Start hypertonic saline at 50 cc/h; titrate as needed -Neurochecks every 4 hours -Sodium checks every 4 hours -Maintain free water restriction -Goal sodium around 128 x 4 a.m. on 2/2 (based on starting sodium of ~120; 115 level today represents acute drop)  2.  Hyperkalemia:  -Resolved  3.  Acute hypoxic respiratory failure/COPD exacerbation: Positive for rhinovirus - bronchodilator therapy/inhaled corticosteroids as well as systemic corticosteroids per primary team  - note decreased respiratory reserve    4.  Hypertension -Continue current medications for now.  No adjustments today  Uncontrolled type 2 diabetes with hyperglycemia: Management per primary team  Continue inpatient monitoring.      Subjective:    Patient states she feels well today.  Denies nausea or vomiting.  Good urine output.  Does have some constipation.  Sodium decreased to 117 and then 115 today.  Starting hypertonic saline.  12 systems reviewed and negative except for HPI.     Objective:   BP (!) 156/67 (BP Location: Left Arm)   Pulse 72   Temp 97.9 F (36.6 C) (Oral)   Resp 16   Ht 5\' 5"  (1.651 m)   Wt 57.5 kg   SpO2 94%   BMI 21.09 kg/m   Intake/Output Summary (Last 24 hours) at 07/01/2023 1139 Last data filed at 07/01/2023 0450 Gross per 24 hour  Intake 357 ml  Output 500 ml  Net -143 ml   Weight change: 0.7 kg  Physical Exam:    General adult female in bed in no acute distress HEENT normocephalic, atraumatic, no nasal discharge Neck  supple trachea midline Lungs clear to auscultation bilaterally, no increased work of breathing Heart normal rate, no audible rub Abdomen soft nontender nondistended Extremities no edema, warm and well-perfused Psych normal mood and affect Neuro -alert and oriented x 3 provides hx and follows commands  GU - no foley   Imaging: No results found.    Labs: BMET Recent Labs  Lab 06/25/23 4098 06/25/23 1191 06/27/23 0430 06/27/23 4782 06/27/23 1249 06/27/23 1807 06/28/23 0604 06/28/23 9562 06/28/23 1533 06/29/23 0152 06/29/23 1308 06/29/23 6578 06/29/23 1835 06/30/23 0525 07/01/23 0522 07/01/23 1000  NA 115*   < > 119*   < > 117*   < > 119* 119*   < > 121* 121* 116* 122* 124* 117* 115*  K 3.7   < > 5.6*  --  4.5  --  5.0 4.7  --   --  4.8  --   --  5.1 4.2  --   CL 81*   < > 82*  --  77*  --  77* 78*  --   --  80*  --   --  83* 82*  --   CO2 18*   < > 26  --  27  --  31 30  --   --  30  --   --  33* 27  --   GLUCOSE 380*   < > 232*  --  212*  --  267* 246*  --   --  225*  --   --  139* 76  --   BUN 15   < > 55*  --  43*  --  33* 31*  --   --  28*  --   --  32* 29*  --   CREATININE 0.66   < > 0.75  --  0.83  --  0.73 0.73  --   --  0.69  --   --  0.86 0.68  --   CALCIUM 6.8*   < > 8.5*  --  8.5*  --  8.5* 8.4*  --   --  8.5*  --   --  8.7* 8.1*  --   PHOS 2.5  --   --   --   --   --  3.3  --   --   --   --   --   --   --   --   --    < > = values in this interval not displayed.   CBC Recent Labs  Lab 06/24/23 2223 06/25/23 0548  WBC 12.2* 9.2  NEUTROABS 10.6*  --   HGB 10.3* 9.9*  HCT 28.7* 28.6*  MCV 81.8 82.2  PLT 467* 417*    Medications:     ALPRAZolam  0.25 mg Oral BID   amLODipine  10 mg Oral Daily   arformoterol  15 mcg Nebulization BID   budesonide (PULMICORT) nebulizer solution  0.5 mg Nebulization BID   calcium carbonate  1 tablet Oral Q breakfast   Chlorhexidine Gluconate Cloth  6 each Topical Daily   hydrALAZINE  25 mg Oral Q8H   insulin aspart   0-20 Units Subcutaneous TID WC   insulin aspart  0-5 Units Subcutaneous QHS   insulin aspart  5 Units Subcutaneous TID WC   insulin glargine-yfgn  25 Units Subcutaneous Daily   insulin starter kit- pen needles  1 kit Other Once   ipratropium-albuterol  3 mL Nebulization TID   metoprolol tartrate  50 mg Oral BID   polyethylene glycol  17 g Oral Daily   predniSONE  20 mg Oral Q breakfast   rosuvastatin  20 mg Oral QHS   sodium chloride  1 g Oral TID WC    Darnell Level, MD 07/01/2023, 11:39 AM

## 2023-07-02 DIAGNOSIS — E871 Hypo-osmolality and hyponatremia: Secondary | ICD-10-CM | POA: Diagnosis not present

## 2023-07-02 LAB — GLUCOSE, CAPILLARY
Glucose-Capillary: 176 mg/dL — ABNORMAL HIGH (ref 70–99)
Glucose-Capillary: 85 mg/dL (ref 70–99)
Glucose-Capillary: 264 mg/dL — ABNORMAL HIGH (ref 70–99)
Glucose-Capillary: 79 mg/dL (ref 70–99)

## 2023-07-02 LAB — BASIC METABOLIC PANEL
Anion gap: 9 (ref 5–15)
BUN: 18 mg/dL (ref 8–23)
CO2: 23 mmol/L (ref 22–32)
Calcium: 7.8 mg/dL — ABNORMAL LOW (ref 8.9–10.3)
Chloride: 89 mmol/L — ABNORMAL LOW (ref 98–111)
Creatinine, Ser: 0.66 mg/dL (ref 0.44–1.00)
GFR, Estimated: >60 mL/min (ref >60)
Glucose, Bld: 237 mg/dL — ABNORMAL HIGH (ref 70–99)
Potassium: 4.1 mmol/L (ref 3.5–5.1)
Sodium: 121 mmol/L — ABNORMAL LOW (ref 135–145)

## 2023-07-02 LAB — SODIUM
Sodium: 123 mmol/L — ABNORMAL LOW (ref 135–145)
Sodium: 124 mmol/L — ABNORMAL LOW (ref 135–145)
Sodium: 128 mmol/L — ABNORMAL LOW (ref 135–145)
Sodium: 129 mmol/L — ABNORMAL LOW (ref 135–145)

## 2023-07-02 MED ORDER — CALCIUM GLUCONATE-NACL 2-0.675 GM/100ML-% IV SOLN: 2.0000 g | Freq: Once | INTRAVENOUS | Status: DC

## 2023-07-02 MED ORDER — SODIUM CHLORIDE 1 G PO TABS
2.0000 g | ORAL_TABLET | Freq: Three times a day (TID) | ORAL | Status: DC
  Administered 2023-07-02 – 2023-07-03 (×5): 2 g via ORAL
  Filled 2023-07-02 (×5): qty 2

## 2023-07-02 MED ORDER — PREDNISONE 10 MG PO TABS
10.0000 mg | ORAL_TABLET | Freq: Every day | ORAL | Status: DC
  Administered 2023-07-03: 10 mg via ORAL
  Filled 2023-07-02: qty 1

## 2023-07-02 MED ORDER — CALCIUM GLUCONATE-NACL 1-0.675 GM/50ML-% IV SOLN
1.0000 g | INTRAVENOUS | Status: AC
  Administered 2023-07-02 (×2): 1000 mg via INTRAVENOUS
  Filled 2023-07-02 (×2): qty 50

## 2023-07-02 NOTE — Progress Notes (Addendum)
PT Plan of Care Note  Patient Details Name: Sara Stafford MRN: [id number] DOB: 06-29-1945   Cancelled Treatment:    Reason Eval/Treat Not Completed: Other (comment) (Transferred back to ICU, per protocol, will need new PT ordered for new higher level of care needed at this time.)   Nelida Meuse 07/02/2023, 11:00 AM

## 2023-07-02 NOTE — Progress Notes (Signed)
PROGRESS NOTE  Sara Stafford:[id number] DOB: 11-22-1945 DOA: 06/24/2023 PCP: Danella Penton, MD  Brief History:  78 year old female with a history of diabetes mellitus type 2, hypertension, hyperlipidemia, anxiety/depression, COPD, tobacco abuse in remission presenting with 3-week history of shortness of breath, wheezing, coughing, and chest congestion.  The patient went to see her PCP on 06/20/2023.  The patient received IM injection of Kenalog.  She was given a prescription for azithromycin and cefdinir.  She finished azithromycin.  She quit taking the cefdinir after 3 doses because she felt like it was making her symptoms worse.  She denied any fevers, chills, headache, chest pain, abdominal pain.  She has some nausea and posttussive emesis.  She had some loose stools without any hematochezia or melena.  She denies any abdominal pain, dysuria, hematuria.  She denies any hemoptysis. Unfortunately, her shortness of breath continue to worsen.  She also noted some lower extremity edema.  As result, she presented for further evaluation and treatment.  She was noted to have oxygen saturation 95% on room air by EMS.  Notably, the patient states that she has been taking sodium chloride twice daily for the past year because of low sodium.  In addition, she states that she has not taken Effexor for over 1 year.  She has only taken 3 doses of Paxil. She had a hospital admission from 07/10/2022 to 07/11/2022 secondary to hyponatremia.  She was started on sodium chloride during this hospitalization.  This was continued until the time of this admission. The patient also was hospitalized from 04/25/2022 to 04/28/2022 for hyponatremia.  She improved with normal saline and stopping her Effexor.  In the ED, the patient was afebrile hemodynamically stable with oxygen saturation 98% on 2 L. WBC 12.2, hemoglobin 10.3, platelets 467.  Sodium 115, potassium 3.6, bicarbonate 22, serum creatinine 0.50.   LFTs were unremarkable.  Chest x-ray showed increased interstitial markings.  The patient was started on normal saline, bronchodilators, and Solu-Medrol.  COVID-19 PCR is negative.  TSH 1.326.  BNP 567.  Troponin 50>> 42.      Subjective:  The patient was seen and examined this morning, stable, continue to complain of generalized weaknesses. No new focal neurological findings, awake alert oriented  Due to quick correction of her sodium with hypertonic saline-fluid was stopped briefly sodium 123 then 119 this morning Hypertonic saline was restarted  Assessment/Plan:  Hyponatremia:  -multifactorial including fluid overload, SIADH, meds, hypovolemia with true hypoosmolar hyponatremia  -She appears clinically fluid overloaded -Continue Check sodium every 4 hours -Give Lasix IV and reassess- still Holding Per Nephro   -Urine osmolarity--412 -Serum osmolarity--259 -Urine sodium 25 -06/25/23 Na = 115 >>>116 >>> 124 >>>117, 115>> 123, 119,121  today   -appreciate nephrology consult>>add Urea BID -Nephrologist following, Continue furosemide -holding now -Tolvaptan 15 mg and today 1/29, another dose 1/30 -07/01/2023 -started hypertonic saline  -07/02/23-overnight hypertonic saline was held due to quick correction Serum sodium was 123 back to 119 this morning -nephrology restarted hypertonic saline With instruction and goal serum sodium 127-128, increased salt tablets Discontinuing ACE/ARB inhibitors   Acute respiratory failure with hypoxia -Resolved -Maintaining O2 sat greater 92% on room air -Multifactorial including COPD exacerbation and fluid overload -Has been successfully weaned off supplemental oxygen -Obtain VBG 7.37/43/36/24   COPD exacerbation -Improved, off supplemental oxygen,  -Satting 96% on room air -COVID-19 PCR negative -viral respiratory panel>> ++ Rhino/Enterovirus -MRSA screen negative -PCT 0.14 -Continue  PRN DuoNebs -Started Brovana -Started  Pulmicort -Patient has approx 30-pack-year history of tobacco;, quit 20 years ago    Fluid overload -Currently euvolemic -Echo EF 65-70%, no WMA, G1DD, normal RVF -Start IV furosemide >>> switching to p.o.--on hold today per nephrology -Daily weights -Accurate I's and O's -obtain ReDS =19   Increased troponin -Secondary to demand ischemia -Chest pain presently -Personally reviewed EKG--sinus rhythm, nonspecific T wave change -Echo EF 65-70%, no WMA, G1DD, normal RVF    Anxiety/depression -Continue home dose alprazolam -PDMP reviewed--alprazolam, 0.25 mg, #120, last refill 05/30/2023   Uncontrolled diabetes mellitus type 2 with hyperglycemia -06/25/23 A1C-9.5 -pt was on insulin drip short time 1/26 -Use resistant sliding scale -Holding metformin and glipizide -add novolog 5 units with meals   Essential hypertension -Continue amlodipine and metoprolol   Mixed hyperlipidemia -Continue statin   Leg pain/edema/debility Generalized versus -venous duplex--neg -Continue PT OT, recommending SNF for further PT OT,    Hypomagnesemia -replete       Family Communication:   no Family at bedside Consultants:  none Code Status:  FULL  DVT Prophylaxis:  [place] Lovenox     Disposition:  Anticipating SNF in 1 days-once sodium improves    procedures: As Listed in Progress Note Above Antibiotics: None Procedures: As Listed in Progress Note Above Antibiotics: None     Objective: Vitals:   07/02/23 0400 07/02/23 0500 07/02/23 0529 07/02/23 0826  BP: (!) 150/60 (!) 151/56    Pulse: 68 70    Resp: 20 (!) 23    Temp:   97.8 F (36.6 C) 97.6 F (36.4 C)  TempSrc:   Oral Oral  SpO2: 98% 96%    Weight:   59 kg   Height:        Intake/Output Summary (Last 24 hours) at 07/02/2023 1009 Last data filed at 07/02/2023 0551 Gross per 24 hour  Intake 1433.84 ml  Output 1250 ml  Net 183.84 ml   Weight change: 1.5 kg         General:  AAO x 3,  cooperative, no distress;    HEENT:  Normocephalic, PERRL, otherwise with in Normal limits   Neuro:  CNII-XII intact. , normal motor and sensation, reflexes intact   Lungs:   Clear to auscultation BL, Respirations unlabored,  No wheezes / crackles  Cardio:    S1/S2, RRR, No murmure, No Rubs or Gallops   Abdomen:  Soft, non-tender, bowel sounds active all four quadrants, no guarding or peritoneal signs.  Muscular  skeletal:  Limited exam -global generalized weaknesses - in bed, able to move all 4 extremities,   2+ pulses,  symmetric, No pitting edema  Skin:  Dry, warm to touch, negative for any Rashes,  Wounds: Please see nursing documentation            Data Reviewed: I have personally reviewed following labs and imaging studies Basic Metabolic Panel: Recent Labs  Lab 06/26/23 0424 06/26/23 0900 06/28/23 0604 06/28/23 0739 06/28/23 1533 06/29/23 0549 06/29/23 0955 06/30/23 0525 07/01/23 0522 07/01/23 1000 07/01/23 1407 07/01/23 1803 07/01/23 2221 07/02/23 0213 07/02/23 0817  NA 118*   < > 119* 119*   < > 121*   < > 124* 117*   < > 122* 121* 119* 123* 121*  K 5.1   < > 5.0 4.7  --  4.8  --  5.1 4.2  --   --   --   --   --  4.1  CL 80*   < >  77* 78*  --  80*  --  83* 82*  --   --   --   --   --  89*  CO2 27   < > 31 30  --  30  --  33* 27  --   --   --   --   --  23  GLUCOSE 272*   < > 267* 246*  --  225*  --  139* 76  --   --   --   --   --  237*  BUN 21   < > 33* 31*  --  28*  --  32* 29*  --   --   --   --   --  18  CREATININE 0.71   < > 0.73 0.73  --  0.69  --  0.86 0.68  --   --   --   --   --  0.66  CALCIUM 7.6*   < > 8.5* 8.4*  --  8.5*  --  8.7* 8.1*  --   --   --   --   --  7.8*  MG 1.3*  --   --   --   --   --   --   --   --   --   --   --   --   --   --   PHOS  --   --  3.3  --   --   --   --   --   --   --   --   --   --   --   --    < > = values in this interval not displayed.   Liver Function Tests: Recent Labs  Lab 06/26/23 1243 06/28/23 0604 06/28/23 0739  AST 30  --  23   ALT 24  --  24  ALKPHOS 91  --  87  BILITOT 0.5  --  0.6  PROT 6.8  --  6.4*  ALBUMIN 3.1* 3.0* 3.0*   No results for input(s): "LIPASE", "AMYLASE" in the last 168 hours. No results for input(s): "AMMONIA" in the last 168 hours. Coagulation Profile: No results for input(s): "INR", "PROTIME" in the last 168 hours. CBC: No results for input(s): "WBC", "NEUTROABS", "HGB", "HCT", "MCV", "PLT" in the last 168 hours.  Cardiac Enzymes: No results for input(s): "CKTOTAL", "CKMB", "CKMBINDEX", "TROPONINI" in the last 168 hours. BNP: Invalid input(s): "POCBNP" CBG: Recent Labs  Lab 07/01/23 0721 07/01/23 1132 07/01/23 1615 07/01/23 2057 07/02/23 0750  GLUCAP 96 223* 140* 219* 176*   HbA1C: No results for input(s): "HGBA1C" in the last 72 hours.  Urine analysis:    Component Value Date/Time   COLORURINE YELLOW (A) 04/25/2022 1857   APPEARANCEUR HAZY (A) 04/25/2022 1857   APPEARANCEUR Hazy (A) 01/26/2021 1309   LABSPEC 1.011 04/25/2022 1857   PHURINE 5.0 04/25/2022 1857   GLUCOSEU NEGATIVE 04/25/2022 1857   GLUCOSEU NEGATIVE 02/29/2012 1116   HGBUR NEGATIVE 04/25/2022 1857   HGBUR large 01/01/2010 1204   BILIRUBINUR NEGATIVE 04/25/2022 1857   BILIRUBINUR Negative 01/26/2021 1309   KETONESUR NEGATIVE 04/25/2022 1857   PROTEINUR NEGATIVE 04/25/2022 1857   UROBILINOGEN 0.2 02/29/2012 1116   NITRITE NEGATIVE 04/25/2022 1857   LEUKOCYTESUR NEGATIVE 04/25/2022 1857   Sepsis Labs: Reviewed @LABRCNTIP (procalcitonin:4,lacticidven:4) COVID/viral panel all negative  Scheduled Meds:  ALPRAZolam  0.25 mg Oral BID   amLODipine  10 mg Oral Daily   arformoterol  15 mcg Nebulization BID   budesonide (PULMICORT) nebulizer solution  0.5 mg Nebulization BID   calcium carbonate  1 tablet Oral Q breakfast   Chlorhexidine Gluconate Cloth  6 each Topical Daily   insulin aspart  0-20 Units Subcutaneous TID WC   insulin aspart  0-5 Units Subcutaneous QHS   insulin aspart  5 Units  Subcutaneous TID WC   insulin glargine-yfgn  25 Units Subcutaneous Daily   insulin starter kit- pen needles  1 kit Other Once   metoprolol tartrate  50 mg Oral BID   polyethylene glycol  17 g Oral Daily   predniSONE  20 mg Oral Q breakfast   rosuvastatin  20 mg Oral QHS   senna-docusate  1 tablet Oral BID   sodium chloride  2 g Oral TID WC   Continuous Infusions:  calcium gluconate     sodium chloride (hypertonic) 50 mL/hr at 07/02/23 1610     Procedures/Studies: DG Hand 2 View Left Result Date: 06/29/2023 CLINICAL DATA:  Finger deformity EXAM: LEFT HAND - 2 VIEW COMPARISON:  None Available. FINDINGS: There is no evidence of fracture or dislocation. There is no evidence of arthropathy or other focal bone abnormality. Mild soft tissue swelling is noted in the distal aspect of the second digit. IMPRESSION: No acute fracture or dislocation is noted. Electronically Signed   By: Alcide Clever M.D.   On: 06/29/2023 03:31   DG Ankle 2 Views Right Result Date: 06/28/2023 CLINICAL DATA:  Right ankle pain. EXAM: RIGHT ANKLE - 2 VIEW COMPARISON:  None available FINDINGS: The ankle mortise is symmetric and intact. Minimal chronic enthesopathic change at the Achilles insertion on the calcaneus. Moderate diffuse lateral greater than medial ankle soft tissue swelling. There is minimal 1 mm cortical step-off of the distal lateral aspect of the fibula. This could represent a tiny superficial acute to subacute subcortical fracture. IMPRESSION: 1. Moderate diffuse lateral greater than medial ankle soft tissue swelling. 2. Possible tiny superficial acute to subacute subcortical fracture of the distal lateral aspect of the fibula. No displacement. Electronically Signed   By: Neita Garnet M.D.   On: 06/28/2023 18:25   DG Ankle 2 Views Left Result Date: 06/28/2023 CLINICAL DATA:  Left greater right ankle pain for months. Fall several months ago. EXAM: LEFT ANKLE - 2 VIEW COMPARISON:  None Available. FINDINGS: The  ankle mortise is symmetric and intact. The ankle joint space is preserved. No acute fracture or dislocation. Mild great toe metatarsal head-sesamoid joint space narrowing and subchondral sclerosis. IMPRESSION: Mild great toe metatarsal head-sesamoid osteoarthritis. Electronically Signed   By: Neita Garnet M.D.   On: 06/28/2023 18:22   US Venous Img Lower Bilateral (DVT) Result Date: 06/25/2023 CLINICAL DATA:  Chest pain and lower extremity edema EXAM: BILATERAL LOWER EXTREMITY VENOUS DOPPLER ULTRASOUND TECHNIQUE: Gray-scale sonography with graded compression, as well as color Doppler and duplex ultrasound were performed to evaluate the lower extremity deep venous systems from the level of the common femoral vein and including the common femoral, femoral, profunda femoral, popliteal and calf veins including the posterior tibial, peroneal and gastrocnemius veins when visible. The superficial great saphenous vein was also interrogated. Spectral Doppler was utilized to evaluate flow at rest and with distal augmentation maneuvers in the common femoral, femoral and popliteal veins. COMPARISON:  None Available. FINDINGS: RIGHT LOWER EXTREMITY Common Femoral Vein: No evidence of thrombus. Normal compressibility, respiratory phasicity and response to augmentation. Saphenofemoral Junction: No evidence of thrombus. Normal compressibility and flow on color  Doppler imaging. Profunda Femoral Vein: No evidence of thrombus. Normal compressibility and flow on color Doppler imaging. Femoral Vein: No evidence of thrombus. Normal compressibility, respiratory phasicity and response to augmentation. Popliteal Vein: No evidence of thrombus. Normal compressibility, respiratory phasicity and response to augmentation. Calf Veins: No evidence of thrombus. Normal compressibility and flow on color Doppler imaging. Superficial Great Saphenous Vein: No evidence of thrombus. Normal compressibility. Venous Reflux:  None. Other Findings:  None.  LEFT LOWER EXTREMITY Common Femoral Vein: No evidence of thrombus. Normal compressibility, respiratory phasicity and response to augmentation. Saphenofemoral Junction: No evidence of thrombus. Normal compressibility and flow on color Doppler imaging. Profunda Femoral Vein: No evidence of thrombus. Normal compressibility and flow on color Doppler imaging. Femoral Vein: No evidence of thrombus. Normal compressibility, respiratory phasicity and response to augmentation. Popliteal Vein: No evidence of thrombus. Normal compressibility, respiratory phasicity and response to augmentation. Calf Veins: No evidence of thrombus. Normal compressibility and flow on color Doppler imaging. Superficial Great Saphenous Vein: No evidence of thrombus. Normal compressibility. Venous Reflux:  None. Other Findings:  None. IMPRESSION: No evidence of deep venous thrombosis in either lower extremity. Electronically Signed   By: Malachy Moan M.D.   On: 06/25/2023 11:31   CT Angio Chest Pulmonary Embolism (PE) W or WO Contrast Result Date: 06/25/2023 CLINICAL DATA:  PE suspected, positive D-dimer, shortness of breath EXAM: CT ANGIOGRAPHY CHEST WITH CONTRAST TECHNIQUE: Multidetector CT imaging of the chest was performed using the standard protocol during bolus administration of intravenous contrast. Multiplanar CT image reconstructions and MIPs were obtained to evaluate the vascular anatomy. RADIATION DOSE REDUCTION: This exam was performed according to the departmental dose-optimization program which includes automated exposure control, adjustment of the mA and/or kV according to patient size and/or use of iterative reconstruction technique. CONTRAST:  75mL OMNIPAQUE IOHEXOL 350 MG/ML SOLN COMPARISON:  05/20/2022 FINDINGS: Cardiovascular: Satisfactory opacification of the pulmonary arteries to the segmental level. No evidence of pulmonary embolism. Normal heart size. Left coronary artery calcifications. No pericardial effusion. Aortic  atherosclerosis. Mediastinum/Nodes: No enlarged mediastinal, hilar, or axillary lymph nodes. Thyroid gland, trachea, and esophagus demonstrate no significant findings. Lungs/Pleura: Small right, trace left pleural effusions. Mild centrilobular emphysema. Diffuse bilateral bronchial wall thickening. Upper Abdomen: No acute abnormality.  Status post cholecystectomy. Musculoskeletal: No chest wall abnormality. No acute osseous findings. Review of the MIP images confirms the above findings. IMPRESSION: 1. Negative examination for pulmonary embolism. 2. Small right, trace left pleural effusions. 3. Mild emphysema and diffuse bilateral bronchial wall thickening. 4. Coronary artery disease. Aortic Atherosclerosis (ICD10-I70.0). Electronically Signed   By: Jearld Lesch M.D.   On: 06/25/2023 11:22   ECHOCARDIOGRAM COMPLETE Result Date: 06/25/2023    ECHOCARDIOGRAM REPORT   Patient Name:   JENTRI AYE Date of Exam: 06/25/2023 Medical Rec #:  [id number]            Height:       65.0 in Accession #:    [id number]           Weight:       130.5 lb Date of Birth:  03/14/1946            BSA:          1.650 m Patient Age:    77 years             BP:           188/98 mmHg Patient Gender: F  HR:           106 bpm. Exam Location:  Inpatient Procedure: 2D Echo, Cardiac Doppler, Color Doppler and Intracardiac            Opacification Agent Indications:    CHF Acute Systolic  History:        Patient has prior history of Echocardiogram examinations and                 Patient has no prior history of Echocardiogram examinations.                 Risk Factors:Hypertension, Diabetes, Dyslipidemia and Former                 Smoker.  Sonographer:    Karma Ganja Referring Phys: [id number] OLADAPO ADEFESO  Sonographer Comments: Technically challenging study due to limited acoustic windows. Image acquisition challenging due to patient body habitus. IMPRESSIONS  1. Left ventricular ejection fraction, by estimation, is 65 to  70%. The left ventricle has normal function. The left ventricle has no regional wall motion abnormalities. Left ventricular diastolic parameters are consistent with Grade I diastolic dysfunction (impaired relaxation). Elevated left atrial pressure.  2. Right ventricular systolic function is normal. The right ventricular size is normal.  3. The mitral valve is normal in structure. Trivial mitral valve regurgitation. No evidence of mitral stenosis.  4. The aortic valve was not well visualized. Aortic valve regurgitation is not visualized. No aortic stenosis is present.  5. The inferior vena cava is normal in size with greater than 50% respiratory variability, suggesting right atrial pressure of 3 mmHg. Comparison(s): No prior Echocardiogram. FINDINGS  Left Ventricle: Left ventricular ejection fraction, by estimation, is 65 to 70%. The left ventricle has normal function. The left ventricle has no regional wall motion abnormalities. Definity contrast agent was given IV to delineate the left ventricular  endocardial borders. The left ventricular internal cavity size was normal in size. There is no left ventricular hypertrophy. Left ventricular diastolic parameters are consistent with Grade I diastolic dysfunction (impaired relaxation). Elevated left atrial pressure. Right Ventricle: The right ventricular size is normal. Right ventricular systolic function is normal. Left Atrium: Left atrial size was normal in size. Right Atrium: Right atrial size was normal in size. Pericardium: There is no evidence of pericardial effusion. Mitral Valve: The mitral valve is normal in structure. Trivial mitral valve regurgitation. No evidence of mitral valve stenosis. Tricuspid Valve: The tricuspid valve is normal in structure. Tricuspid valve regurgitation is not demonstrated. No evidence of tricuspid stenosis. Aortic Valve: The aortic valve was not well visualized. Aortic valve regurgitation is not visualized. No aortic stenosis is  present. Aortic valve mean gradient measures 4.0 mmHg. Aortic valve peak gradient measures 7.4 mmHg. Pulmonic Valve: The pulmonic valve was not well visualized. Pulmonic valve regurgitation is not visualized. No evidence of pulmonic stenosis. Aorta: The aortic root is normal in size and structure. Venous: The inferior vena cava is normal in size with greater than 50% respiratory variability, suggesting right atrial pressure of 3 mmHg. IAS/Shunts: No atrial level shunt detected by color flow Doppler.  LEFT VENTRICLE PLAX 2D LVIDd:         4.50 cm     Diastology LVIDs:         3.50 cm     LV e' medial:    6.42 cm/s LV PW:         0.70 cm     LV E/e' medial:  17.0 LV  IVS:        0.80 cm     LV e' lateral:   10.10 cm/s LVOT diam:     1.80 cm     LV E/e' lateral: 10.8 LV SV:         59 LV SV Index:   36 LVOT Area:     2.54 cm  LV Volumes (MOD) LV vol d, MOD A2C: 39.1 ml LV vol d, MOD A4C: 50.3 ml LV vol s, MOD A2C: 13.0 ml LV vol s, MOD A4C: 23.9 ml LV SV MOD A2C:     26.1 ml LV SV MOD A4C:     50.3 ml LV SV MOD BP:      28.2 ml RIGHT VENTRICLE            IVC RV S prime:     7.40 cm/s  IVC diam: 2.10 cm LEFT ATRIUM             Index        RIGHT ATRIUM          Index LA diam:        3.30 cm 2.00 cm/m   RA Area:     9.49 cm LA Vol (A2C):   43.9 ml 26.61 ml/m  RA Volume:   20.80 ml 12.61 ml/m LA Vol (A4C):   33.6 ml 20.36 ml/m LA Biplane Vol: 38.6 ml 23.39 ml/m  AORTIC VALVE AV Area (Vmax):    2.34 cm AV Area (Vmean):   2.12 cm AV Area (VTI):     2.50 cm AV Vmax:           136.00 cm/s AV Vmean:          90.500 cm/s AV VTI:            0.236 m AV Peak Grad:      7.4 mmHg AV Mean Grad:      4.0 mmHg LVOT Vmax:         125.00 cm/s LVOT Vmean:        75.500 cm/s LVOT VTI:          0.232 m LVOT/AV VTI ratio: 0.98  AORTA Ao Root diam: 2.60 cm MITRAL VALVE MV Area (PHT): 7.82 cm     SHUNTS MV Decel Time: 97 msec      Systemic VTI:  0.23 m MR Peak grad: 132.7 mmHg    Systemic Diam: 1.80 cm MR Vmax:      576.00 cm/s MV E  velocity: 109.00 cm/s MV A velocity: 117.00 cm/s MV E/A ratio:  0.93 Olga Millers MD Electronically signed by Olga Millers MD Signature Date/Time: 06/25/2023/11:20:12 AM    Final    DG Chest 2 View Result Date: 06/24/2023 CLINICAL DATA:  Cough and dyspnea EXAM: CHEST - 2 VIEW COMPARISON:  None Available. FINDINGS: There are increased central interstitial markings bilaterally. There is no lung consolidation, pleural effusion or pneumothorax. The cardiomediastinal silhouette is within normal limits. No acute fractures are seen. IMPRESSION: Increased central interstitial markings bilaterally may reflect atypical/viral infection. No lung consolidation. Electronically Signed   By: Darliss Cheney M.D.   On: 06/24/2023 22:58    Kendell Bane, MD  [facility]  Total of 55 minutes of critical time was spent evaluating and examining the patient, reviewing all medical records, current medication discussion with ICU staff and nephrologist.    If 7PM-7AM, please contact night-coverage [email or web address] Password [email or web address] 07/02/2023, 10:09 AM   LOS: 8 days

## 2023-07-02 NOTE — Progress Notes (Addendum)
Brief Nephrology Note  Na improved yesterday on hypertonic as expected. Unfortunately hypertonic was stopped by pharmacy and sodium decreased back to 119. I restarted hypertonic last night and sodium has again improved to 123. Goal sodium 130-134 by tomorrow morning. Inc hypertonic to 50cc/hr and continue until sodium is at least 127-128. Will increase salt tabs as well to help liberate from hypertonic.  I changed hydralazine to losartan. Can increase this as needed.

## 2023-07-03 ENCOUNTER — Non-Acute Institutional Stay (SKILLED_NURSING_FACILITY): Payer: Self-pay | Admitting: Adult Health

## 2023-07-03 ENCOUNTER — Other Ambulatory Visit: Payer: Self-pay | Admitting: Adult Health

## 2023-07-03 DIAGNOSIS — E1165 Type 2 diabetes mellitus with hyperglycemia: Secondary | ICD-10-CM

## 2023-07-03 DIAGNOSIS — F339 Major depressive disorder, recurrent, unspecified: Secondary | ICD-10-CM

## 2023-07-03 DIAGNOSIS — I152 Hypertension secondary to endocrine disorders: Secondary | ICD-10-CM

## 2023-07-03 DIAGNOSIS — E785 Hyperlipidemia, unspecified: Secondary | ICD-10-CM

## 2023-07-03 DIAGNOSIS — D649 Anemia, unspecified: Secondary | ICD-10-CM

## 2023-07-03 DIAGNOSIS — E440 Moderate protein-calorie malnutrition: Secondary | ICD-10-CM

## 2023-07-03 DIAGNOSIS — I700 Atherosclerosis of aorta: Secondary | ICD-10-CM

## 2023-07-03 DIAGNOSIS — E1169 Type 2 diabetes mellitus with other specified complication: Secondary | ICD-10-CM

## 2023-07-03 DIAGNOSIS — Z794 Long term (current) use of insulin: Secondary | ICD-10-CM

## 2023-07-03 DIAGNOSIS — E1159 Type 2 diabetes mellitus with other circulatory complications: Secondary | ICD-10-CM

## 2023-07-03 DIAGNOSIS — E871 Hypo-osmolality and hyponatremia: Secondary | ICD-10-CM

## 2023-07-03 DIAGNOSIS — J441 Chronic obstructive pulmonary disease with (acute) exacerbation: Secondary | ICD-10-CM

## 2023-07-03 DIAGNOSIS — D75839 Thrombocytosis, unspecified: Secondary | ICD-10-CM

## 2023-07-03 LAB — GLUCOSE, CAPILLARY
Glucose-Capillary: 63 mg/dL — ABNORMAL LOW (ref 70–99)
Glucose-Capillary: 197 mg/dL — ABNORMAL HIGH (ref 70–99)
Glucose-Capillary: 225 mg/dL — ABNORMAL HIGH (ref 70–99)

## 2023-07-03 LAB — SODIUM
Sodium: 131 mmol/L — ABNORMAL LOW (ref 135–145)
Sodium: 127 mmol/L — ABNORMAL LOW (ref 135–145)
Sodium: 129 mmol/L — ABNORMAL LOW (ref 135–145)

## 2023-07-03 LAB — PREALBUMIN: Prealbumin: 23 mg/dL (ref 18–38)

## 2023-07-03 LAB — CORTISOL: Cortisol, Plasma: 22.1 ug/dL

## 2023-07-03 MED ORDER — ALPRAZOLAM 0.25 MG PO TABS: 0.1250 mg | ORAL_TABLET | Freq: Two times a day (BID) | ORAL | 0 refills | Status: AC | PRN

## 2023-07-03 MED ORDER — SODIUM CHLORIDE 1 G PO TABS: 2.0000 g | ORAL_TABLET | Freq: Two times a day (BID) | ORAL | 0 refills | Status: AC

## 2023-07-03 MED ORDER — ALPRAZOLAM 0.25 MG PO TABS: 0.1250 mg | ORAL_TABLET | Freq: Two times a day (BID) | ORAL | 0 refills | Status: DC

## 2023-07-03 MED ORDER — INSULIN GLARGINE-YFGN 100 UNIT/ML ~~LOC~~ SOLN: 20.0000 [IU] | Freq: Every day | SUBCUTANEOUS | 11 refills | Status: DC

## 2023-07-03 MED ORDER — CALCIUM CARBONATE 1250 (500 CA) MG PO TABS: 1.0000 | ORAL_TABLET | Freq: Every day | ORAL | 0 refills | Status: AC

## 2023-07-03 NOTE — Discharge Summary (Signed)
Physician Discharge Summary   Patient: Sara Stafford MRN: [id number] DOB: July 23, 1945  Admit date:     06/24/2023  Discharge date: 07/03/23  Discharge Physician: Kendell Bane   PCP: Danella Penton, MD   Recommendations at discharge:  - BMP in 2 - 5 days (referring sodium level acceptable sodium level to 127-134) Adjust the sodium supplements accordingly -Follow-up with nephrologist in 1-2 weeks - For now monitor blood sugar before meals, adjust insulin long-acting, continue SSI coverage, (Insulin requiring may be adjusted as change to p.o. intake) - Follow-up with PCP in 1 week - Continue PT OT, fall precautions  Discharge Diagnoses: Principal Problem:   Hyponatremia Active Problems:   Mixed hyperlipidemia   Acute exacerbation of chronic obstructive pulmonary disease (COPD) (HCC)   Essential hypertension   Acute respiratory failure with hypoxia (HCC)   Elevated troponin   Elevated brain natriuretic peptide (BNP) level   Thrombocytosis   Type 2 diabetes mellitus with hyperglycemia (HCC)   GERD (gastroesophageal reflux disease)   Hypocalcemia   Hypoalbuminemia due to protein-calorie malnutrition (HCC)   Uncontrolled type 2 diabetes mellitus with hyperglycemia, without long-term current use of insulin (HCC)  Resolved Problems:   * No resolved hospital problems. *  Hospital Course: 78 year old female with a history of diabetes mellitus type 2, hypertension, hyperlipidemia, anxiety/depression, COPD, tobacco abuse in remission presenting with 3-week history of shortness of breath, wheezing, coughing, and chest congestion.  The patient went to see her PCP on 06/20/2023.  The patient received IM injection of Kenalog.  She was given a prescription for azithromycin and cefdinir.  She finished azithromycin.  She quit taking the cefdinir after 3 doses because she felt like it was making her symptoms worse.  She denied any fevers, chills, headache, chest pain, abdominal pain.  She  has some nausea and posttussive emesis.  She had some loose stools without any hematochezia or melena.  She denies any abdominal pain, dysuria, hematuria.  She denies any hemoptysis. Unfortunately, her shortness of breath continue to worsen.  She also noted some lower extremity edema.  As result, she presented for further evaluation and treatment.  She was noted to have oxygen saturation 95% on room air by EMS.  Notably, the patient states that she has been taking sodium chloride twice daily for the past year because of low sodium.  In addition, she states that she has not taken Effexor for over 1 year.  She has only taken 3 doses of Paxil. She had a hospital admission from 07/10/2022 to 07/11/2022 secondary to hyponatremia.  She was started on sodium chloride during this hospitalization.  This was continued until the time of this admission. The patient also was hospitalized from 04/25/2022 to 04/28/2022 for hyponatremia.  She improved with normal saline and stopping her Effexor.  In the ED, the patient was afebrile hemodynamically stable with oxygen saturation 98% on 2 L. WBC 12.2, hemoglobin 10.3, platelets 467.  Sodium 115, potassium 3.6, bicarbonate 22, serum creatinine 0.50.  LFTs were unremarkable.  Chest x-ray showed increased interstitial markings.  The patient was started on normal saline, bronchodilators, and Solu-Medrol.  COVID-19 PCR is negative.  TSH 1.326.  BNP 567.  Troponin 50>> 42.     Hyponatremia:   -multifactorial including fluid overload, SIADH, meds, hypovolemia with true hypoosmolar hyponatremia   -She appears clinically fluid overloaded -Continue Check sodium every 4 hours -Give Lasix IV and reassess- still Holding Per Nephro    -Urine osmolarity--412 -Serum osmolarity--259 -Urine sodium 25 -  06/25/23 Na = 115 >>>116 >>> since discontinuation of hypertonic saline>>> sodium levels 128, 129, 131, 129 now -Nephrology continue sodium chloride tablets supplements Monitor  sodium level, BMP in 2 days adjust supplement sodium level according   -appreciate nephrology consult>> D/Ed Urea -Nephrologist following, Continue furosemide BB held periodically -Tolvaptan 15 mg and 1/29, another dose 1/30 -07/01/2023 -started hypertonic saline discontinued around 1600 on 07/02/2023    instruction and goal serum sodium 127-128, increased salt tablets Discontinuing ACE/ARB inhibitors     Acute respiratory failure with hypoxia -Resolved -Maintaining O2 sat greater 92% on room air -Multifactorial including COPD exacerbation and fluid overload -Has been successfully weaned off supplemental oxygen -Obtain VBG 7.37/43/36/24   COPD exacerbation -Improved, off supplemental oxygen,  -Satting 96% on room air -COVID-19 PCR negative -viral respiratory panel>> ++ Rhino/Enterovirus -MRSA screen negative -PCT 0.14 -Continue PRN DuoNebs -Started Brovana -Started Pulmicort -Patient has approx 30-pack-year history of tobacco;, quit 20 years ago     Fluid overload -Currently euvolemic -Echo EF 65-70%, no WMA, G1DD, normal RVF -Start IV furosemide >>> switching to p.o.--on hold today per nephrology -Daily weights -Accurate I's and O's -obtain ReDS =19   Increased troponin -Secondary to demand ischemia -Chest pain presently -Personally reviewed EKG--sinus rhythm, nonspecific T wave change -Echo EF 65-70%, no WMA, G1DD, normal RVF     Anxiety/depression -Continue home dose alprazolam -PDMP reviewed--alprazolam, 0.25 mg, #120, last refill 05/30/2023   Uncontrolled diabetes mellitus type 2 with hyperglycemia -06/25/23 A1C-9.5 -pt was on insulin drip short time 1/26 -Use resistant sliding scale -D/C metformin and glipizide -add novolog 5 units with meals   Essential hypertension -Continue amlodipine and metoprolol   Mixed hyperlipidemia -Continue statin   Leg pain/edema/debility Generalized versus -venous duplex--neg -Continue PT OT, recommending SNF for  further PT OT,    Hypomagnesemia -replete   Consultants: Nephrologist  Disposition: Skilled nursing facility Diet recommendation:  Discharge Diet Orders (From admission, onward)     Start     Ordered   07/03/23 0000  Diet - low sodium heart healthy        07/03/23 1043           Regular diet DISCHARGE MEDICATION: Allergies as of 07/03/2023       Reactions   Empagliflozin Nausea And Vomiting, Nausea Only   Ozempic (0.25 Or 0.5 Mg-dose) [semaglutide(0.25 Or 0.5mg -dos)] Nausea And Vomiting   Semaglutide Nausea Only   Venlafaxine Other (See Comments)   Low sodium   Codeine Nausea And Vomiting   Doxycycline Other (See Comments)   REACTION: thrush thrush   Mirtazapine Other (See Comments)   Significant fatigue at low-dose        Medication List     STOP taking these medications    azithromycin 250 MG tablet Commonly known as: ZITHROMAX   cefdinir 300 MG capsule Commonly known as: OMNICEF   fluconazole 100 MG tablet Commonly known as: DIFLUCAN   glipiZIDE 10 MG 24 hr tablet Commonly known as: GLUCOTROL XL   levofloxacin 500 MG tablet Commonly known as: LEVAQUIN   metFORMIN 750 MG 24 hr tablet Commonly known as: GLUCOPHAGE-XR   omeprazole 20 MG capsule Commonly known as: PRILOSEC   sitaGLIPtin 50 MG tablet Commonly known as: JANUVIA       TAKE these medications    ALPRAZolam 0.25 MG tablet Commonly known as: XANAX Take 0.5 tablets (0.125 mg total) by mouth 2 (two) times daily for 10 days.   amLODipine 10 MG tablet Commonly known as: NORVASC Take  1 tablet (10 mg total) by mouth daily.   calcium carbonate 1250 (500 Ca) MG tablet Commonly known as: OS-CAL - dosed in mg of elemental calcium Take 1 tablet (1,250 mg total) by mouth daily with breakfast. Start taking on: July 04, 2023   insulin aspart 100 UNIT/ML injection Commonly known as: novoLOG Inject 0-20 Units into the skin 3 (three) times daily with meals.   insulin glargine-yfgn  100 UNIT/ML injection Commonly known as: SEMGLEE Inject 0.2 mLs (20 Units total) into the skin daily. Start taking on: July 04, 2023   metoprolol tartrate 25 MG tablet Commonly known as: LOPRESSOR Take 1 tablet (25 mg total) by mouth 2 (two) times daily.   ondansetron 4 MG disintegrating tablet Commonly known as: ZOFRAN-ODT Take 4 mg by mouth every 8 (eight) hours as needed.   predniSONE 10 MG tablet Commonly known as: DELTASONE Take by mouth. What changed: Another medication with the same name was removed. Continue taking this medication, and follow the directions you see here.   PreserVision/Lutein Caps Take 1 capsule by mouth as directed.   rosuvastatin 20 MG tablet Commonly known as: CRESTOR Take 20 mg by mouth at bedtime.   sodium chloride 1 g tablet Take 2 tablets (2 g total) by mouth 2 (two) times daily with a meal.   Ventolin HFA 108 (90 Base) MCG/ACT inhaler Generic drug: albuterol INHALE 2 PUFFS BY MOUTH EVERY 4 HOURS AS NEEDED   albuterol 108 (90 Base) MCG/ACT inhaler Commonly known as: VENTOLIN HFA Inhale 2 puffs into the lungs every 4 (four) hours as needed.        Contact information for after-discharge care     Destination     [facility] .   Service: Skilled Nursing Contact information: [address] [place] [address] [phone]                    Discharge Exam: Filed Weights   07/02/23 0529 07/03/23 0400 07/03/23 0800  Weight: 59 kg 57 kg 57 kg        General:  AAO x 3,  cooperative, no distress;   HEENT:  Normocephalic, PERRL, otherwise with in Normal limits   Neuro:  CNII-XII intact. , normal motor and sensation, reflexes intact   Lungs:   Clear to auscultation BL, Respirations unlabored,  No wheezes / crackles  Cardio:    S1/S2, RRR, No murmure, No Rubs or Gallops   Abdomen:  Soft, non-tender, bowel sounds active all four quadrants, no guarding or peritoneal signs.   Muscular  skeletal:  Limited exam -global generalized weaknesses - in bed, able to move all 4 extremities,   2+ pulses,  symmetric, No pitting edema  Skin:  Dry, warm to touch, negative for any Rashes,  Wounds: Please see nursing documentation          Condition at discharge: good  The results of significant diagnostics from this hospitalization (including imaging, microbiology, ancillary and laboratory) are listed below for reference.   Imaging Studies: DG Hand 2 View Left Result Date: 06/29/2023 CLINICAL DATA:  Finger deformity EXAM: LEFT HAND - 2 VIEW COMPARISON:  None Available. FINDINGS: There is no evidence of fracture or dislocation. There is no evidence of arthropathy or other focal bone abnormality. Mild soft tissue swelling is noted in the distal aspect of the second digit. IMPRESSION: No acute fracture or dislocation is noted. Electronically Signed   By: Alcide Clever M.D.   On:  06/29/2023 03:31   DG Ankle 2 Views Right Result Date: 06/28/2023 CLINICAL DATA:  Right ankle pain. EXAM: RIGHT ANKLE - 2 VIEW COMPARISON:  None available FINDINGS: The ankle mortise is symmetric and intact. Minimal chronic enthesopathic change at the Achilles insertion on the calcaneus. Moderate diffuse lateral greater than medial ankle soft tissue swelling. There is minimal 1 mm cortical step-off of the distal lateral aspect of the fibula. This could represent a tiny superficial acute to subacute subcortical fracture. IMPRESSION: 1. Moderate diffuse lateral greater than medial ankle soft tissue swelling. 2. Possible tiny superficial acute to subacute subcortical fracture of the distal lateral aspect of the fibula. No displacement. Electronically Signed   By: Neita Garnet M.D.   On: 06/28/2023 18:25   DG Ankle 2 Views Left Result Date: 06/28/2023 CLINICAL DATA:  Left greater right ankle pain for months. Fall several months ago. EXAM: LEFT ANKLE - 2 VIEW COMPARISON:  None Available. FINDINGS: The ankle  mortise is symmetric and intact. The ankle joint space is preserved. No acute fracture or dislocation. Mild great toe metatarsal head-sesamoid joint space narrowing and subchondral sclerosis. IMPRESSION: Mild great toe metatarsal head-sesamoid osteoarthritis. Electronically Signed   By: Neita Garnet M.D.   On: 06/28/2023 18:22   US Venous Img Lower Bilateral (DVT) Result Date: 06/25/2023 CLINICAL DATA:  Chest pain and lower extremity edema EXAM: BILATERAL LOWER EXTREMITY VENOUS DOPPLER ULTRASOUND TECHNIQUE: Gray-scale sonography with graded compression, as well as color Doppler and duplex ultrasound were performed to evaluate the lower extremity deep venous systems from the level of the common femoral vein and including the common femoral, femoral, profunda femoral, popliteal and calf veins including the posterior tibial, peroneal and gastrocnemius veins when visible. The superficial great saphenous vein was also interrogated. Spectral Doppler was utilized to evaluate flow at rest and with distal augmentation maneuvers in the common femoral, femoral and popliteal veins. COMPARISON:  None Available. FINDINGS: RIGHT LOWER EXTREMITY Common Femoral Vein: No evidence of thrombus. Normal compressibility, respiratory phasicity and response to augmentation. Saphenofemoral Junction: No evidence of thrombus. Normal compressibility and flow on color Doppler imaging. Profunda Femoral Vein: No evidence of thrombus. Normal compressibility and flow on color Doppler imaging. Femoral Vein: No evidence of thrombus. Normal compressibility, respiratory phasicity and response to augmentation. Popliteal Vein: No evidence of thrombus. Normal compressibility, respiratory phasicity and response to augmentation. Calf Veins: No evidence of thrombus. Normal compressibility and flow on color Doppler imaging. Superficial Great Saphenous Vein: No evidence of thrombus. Normal compressibility. Venous Reflux:  None. Other Findings:  None. LEFT  LOWER EXTREMITY Common Femoral Vein: No evidence of thrombus. Normal compressibility, respiratory phasicity and response to augmentation. Saphenofemoral Junction: No evidence of thrombus. Normal compressibility and flow on color Doppler imaging. Profunda Femoral Vein: No evidence of thrombus. Normal compressibility and flow on color Doppler imaging. Femoral Vein: No evidence of thrombus. Normal compressibility, respiratory phasicity and response to augmentation. Popliteal Vein: No evidence of thrombus. Normal compressibility, respiratory phasicity and response to augmentation. Calf Veins: No evidence of thrombus. Normal compressibility and flow on color Doppler imaging. Superficial Great Saphenous Vein: No evidence of thrombus. Normal compressibility. Venous Reflux:  None. Other Findings:  None. IMPRESSION: No evidence of deep venous thrombosis in either lower extremity. Electronically Signed   By: Malachy Moan M.D.   On: 06/25/2023 11:31   CT Angio Chest Pulmonary Embolism (PE) W or WO Contrast Result Date: 06/25/2023 CLINICAL DATA:  PE suspected, positive D-dimer, shortness of breath EXAM: CT ANGIOGRAPHY CHEST WITH  CONTRAST TECHNIQUE: Multidetector CT imaging of the chest was performed using the standard protocol during bolus administration of intravenous contrast. Multiplanar CT image reconstructions and MIPs were obtained to evaluate the vascular anatomy. RADIATION DOSE REDUCTION: This exam was performed according to the departmental dose-optimization program which includes automated exposure control, adjustment of the mA and/or kV according to patient size and/or use of iterative reconstruction technique. CONTRAST:  75mL OMNIPAQUE IOHEXOL 350 MG/ML SOLN COMPARISON:  05/20/2022 FINDINGS: Cardiovascular: Satisfactory opacification of the pulmonary arteries to the segmental level. No evidence of pulmonary embolism. Normal heart size. Left coronary artery calcifications. No pericardial effusion. Aortic  atherosclerosis. Mediastinum/Nodes: No enlarged mediastinal, hilar, or axillary lymph nodes. Thyroid gland, trachea, and esophagus demonstrate no significant findings. Lungs/Pleura: Small right, trace left pleural effusions. Mild centrilobular emphysema. Diffuse bilateral bronchial wall thickening. Upper Abdomen: No acute abnormality.  Status post cholecystectomy. Musculoskeletal: No chest wall abnormality. No acute osseous findings. Review of the MIP images confirms the above findings. IMPRESSION: 1. Negative examination for pulmonary embolism. 2. Small right, trace left pleural effusions. 3. Mild emphysema and diffuse bilateral bronchial wall thickening. 4. Coronary artery disease. Aortic Atherosclerosis (ICD10-I70.0). Electronically Signed   By: Jearld Lesch M.D.   On: 06/25/2023 11:22   ECHOCARDIOGRAM COMPLETE Result Date: 06/25/2023    ECHOCARDIOGRAM REPORT   Patient Name:   YANELY MAST Date of Exam: 06/25/2023 Medical Rec #:  [id number]            Height:       65.0 in Accession #:    [id number]           Weight:       130.5 lb Date of Birth:  Jan 10, 1946            BSA:          1.650 m Patient Age:    77 years             BP:           188/98 mmHg Patient Gender: F                    HR:           106 bpm. Exam Location:  Inpatient Procedure: 2D Echo, Cardiac Doppler, Color Doppler and Intracardiac            Opacification Agent Indications:    CHF Acute Systolic  History:        Patient has prior history of Echocardiogram examinations and                 Patient has no prior history of Echocardiogram examinations.                 Risk Factors:Hypertension, Diabetes, Dyslipidemia and Former                 Smoker.  Sonographer:    Karma Ganja Referring Phys: [id number] OLADAPO ADEFESO  Sonographer Comments: Technically challenging study due to limited acoustic windows. Image acquisition challenging due to patient body habitus. IMPRESSIONS  1. Left ventricular ejection fraction, by estimation, is 65 to  70%. The left ventricle has normal function. The left ventricle has no regional wall motion abnormalities. Left ventricular diastolic parameters are consistent with Grade I diastolic dysfunction (impaired relaxation). Elevated left atrial pressure.  2. Right ventricular systolic function is normal. The right ventricular size is normal.  3. The mitral valve is normal in structure. Trivial mitral valve regurgitation. No  evidence of mitral stenosis.  4. The aortic valve was not well visualized. Aortic valve regurgitation is not visualized. No aortic stenosis is present.  5. The inferior vena cava is normal in size with greater than 50% respiratory variability, suggesting right atrial pressure of 3 mmHg. Comparison(s): No prior Echocardiogram. FINDINGS  Left Ventricle: Left ventricular ejection fraction, by estimation, is 65 to 70%. The left ventricle has normal function. The left ventricle has no regional wall motion abnormalities. Definity contrast agent was given IV to delineate the left ventricular  endocardial borders. The left ventricular internal cavity size was normal in size. There is no left ventricular hypertrophy. Left ventricular diastolic parameters are consistent with Grade I diastolic dysfunction (impaired relaxation). Elevated left atrial pressure. Right Ventricle: The right ventricular size is normal. Right ventricular systolic function is normal. Left Atrium: Left atrial size was normal in size. Right Atrium: Right atrial size was normal in size. Pericardium: There is no evidence of pericardial effusion. Mitral Valve: The mitral valve is normal in structure. Trivial mitral valve regurgitation. No evidence of mitral valve stenosis. Tricuspid Valve: The tricuspid valve is normal in structure. Tricuspid valve regurgitation is not demonstrated. No evidence of tricuspid stenosis. Aortic Valve: The aortic valve was not well visualized. Aortic valve regurgitation is not visualized. No aortic stenosis is  present. Aortic valve mean gradient measures 4.0 mmHg. Aortic valve peak gradient measures 7.4 mmHg. Pulmonic Valve: The pulmonic valve was not well visualized. Pulmonic valve regurgitation is not visualized. No evidence of pulmonic stenosis. Aorta: The aortic root is normal in size and structure. Venous: The inferior vena cava is normal in size with greater than 50% respiratory variability, suggesting right atrial pressure of 3 mmHg. IAS/Shunts: No atrial level shunt detected by color flow Doppler.  LEFT VENTRICLE PLAX 2D LVIDd:         4.50 cm     Diastology LVIDs:         3.50 cm     LV e' medial:    6.42 cm/s LV PW:         0.70 cm     LV E/e' medial:  17.0 LV IVS:        0.80 cm     LV e' lateral:   10.10 cm/s LVOT diam:     1.80 cm     LV E/e' lateral: 10.8 LV SV:         59 LV SV Index:   36 LVOT Area:     2.54 cm  LV Volumes (MOD) LV vol d, MOD A2C: 39.1 ml LV vol d, MOD A4C: 50.3 ml LV vol s, MOD A2C: 13.0 ml LV vol s, MOD A4C: 23.9 ml LV SV MOD A2C:     26.1 ml LV SV MOD A4C:     50.3 ml LV SV MOD BP:      28.2 ml RIGHT VENTRICLE            IVC RV S prime:     7.40 cm/s  IVC diam: 2.10 cm LEFT ATRIUM             Index        RIGHT ATRIUM          Index LA diam:        3.30 cm 2.00 cm/m   RA Area:     9.49 cm LA Vol (A2C):   43.9 ml 26.61 ml/m  RA Volume:   20.80 ml 12.61 ml/m LA Vol (A4C):  33.6 ml 20.36 ml/m LA Biplane Vol: 38.6 ml 23.39 ml/m  AORTIC VALVE AV Area (Vmax):    2.34 cm AV Area (Vmean):   2.12 cm AV Area (VTI):     2.50 cm AV Vmax:           136.00 cm/s AV Vmean:          90.500 cm/s AV VTI:            0.236 m AV Peak Grad:      7.4 mmHg AV Mean Grad:      4.0 mmHg LVOT Vmax:         125.00 cm/s LVOT Vmean:        75.500 cm/s LVOT VTI:          0.232 m LVOT/AV VTI ratio: 0.98  AORTA Ao Root diam: 2.60 cm MITRAL VALVE MV Area (PHT): 7.82 cm     SHUNTS MV Decel Time: 97 msec      Systemic VTI:  0.23 m MR Peak grad: 132.7 mmHg    Systemic Diam: 1.80 cm MR Vmax:      576.00 cm/s MV E  velocity: 109.00 cm/s MV A velocity: 117.00 cm/s MV E/A ratio:  0.93 Olga Millers MD Electronically signed by Olga Millers MD Signature Date/Time: 06/25/2023/11:20:12 AM    Final    DG Chest 2 View Result Date: 06/24/2023 CLINICAL DATA:  Cough and dyspnea EXAM: CHEST - 2 VIEW COMPARISON:  None Available. FINDINGS: There are increased central interstitial markings bilaterally. There is no lung consolidation, pleural effusion or pneumothorax. The cardiomediastinal silhouette is within normal limits. No acute fractures are seen. IMPRESSION: Increased central interstitial markings bilaterally may reflect atypical/viral infection. No lung consolidation. Electronically Signed   By: Darliss Cheney M.D.   On: 06/24/2023 22:58    Microbiology: Results for orders placed or performed during the hospital encounter of 06/24/23  Resp panel by RT-PCR (RSV, Flu A&B, Covid) Anterior Nasal Swab     Status: None   Collection Time: 06/24/23 10:24 PM   Specimen: Anterior Nasal Swab  Result Value Ref Range Status   SARS Coronavirus 2 by RT PCR NEGATIVE NEGATIVE Final    Comment: (NOTE) SARS-CoV-2 target nucleic acids are NOT DETECTED.  The SARS-CoV-2 RNA is generally detectable in upper respiratory specimens during the acute phase of infection. The lowest concentration of SARS-CoV-2 viral copies this assay can detect is 138 copies/mL. A negative result does not preclude SARS-Cov-2 infection and should not be used as the sole basis for treatment or other patient management decisions. A negative result may occur with  improper specimen collection/handling, submission of specimen other than nasopharyngeal swab, presence of viral mutation(s) within the areas targeted by this assay, and inadequate number of viral copies(<138 copies/mL). A negative result must be combined with clinical observations, patient history, and epidemiological information. The expected result is Negative.  Fact Sheet for Patients:   BloggerCourse.com  Fact Sheet for Healthcare Providers:  SeriousBroker.it  This test is no t yet approved or cleared by the [place] FDA and  has been authorized for detection and/or diagnosis of SARS-CoV-2 by FDA under an Emergency Use Authorization (EUA). This EUA will remain  in effect (meaning this test can be used) for the duration of the COVID-19 declaration under Section 564(b)(1) of the Act, 21 U.S.C.section 360bbb-3(b)(1), unless the authorization is terminated  or revoked sooner.       Influenza A by PCR NEGATIVE NEGATIVE Final   Influenza B by  PCR NEGATIVE NEGATIVE Final    Comment: (NOTE) The Xpert Xpress SARS-CoV-2/FLU/RSV plus assay is intended as an aid in the diagnosis of influenza from Nasopharyngeal swab specimens and should not be used as a sole basis for treatment. Nasal washings and aspirates are unacceptable for Xpert Xpress SARS-CoV-2/FLU/RSV testing.  Fact Sheet for Patients: BloggerCourse.com  Fact Sheet for Healthcare Providers: SeriousBroker[employer]it  This test is not yet approved or cleared by the [place] FDA and has been authorized for detection and/or diagnosis of SARS-CoV-2 by FDA under an Emergency Use Authorization (EUA). This EUA will remain in effect (meaning this test can be used) for the duration of the COVID-19 declaration under Section 564(b)(1) of the Act, 21 U.S.C. section 360bbb-3(b)(1), unless the authorization is terminated or revoked.     Resp Syncytial Virus by PCR NEGATIVE NEGATIVE Final    Comment: (NOTE) Fact Sheet for Patients: BloggerCourse.com  Fact Sheet for Healthcare Providers: SeriousBroker[employer]it  This test is not yet approved or cleared by the [place] FDA and has been authorized for detection and/or diagnosis of SARS-CoV-2 by FDA under an Emergency Use  Authorization (EUA). This EUA will remain in effect (meaning this test can be used) for the duration of the COVID-19 declaration under Section 564(b)(1) of the Act, 21 U.S.C. section 360bbb-3(b)(1), unless the authorization is terminated or revoked.  Performed at [facility], [address] [place], [place] [address]   MRSA Next Gen by PCR, Nasal     Status: None   Collection Time: 06/25/23 12:57 AM   Specimen: Nasal Mucosa; Nasal Swab  Result Value Ref Range Status   MRSA by PCR Next Gen NOT DETECTED NOT DETECTED Final    Comment: (NOTE) The GeneXpert MRSA Assay (FDA approved for NASAL specimens only), is one component of a comprehensive MRSA colonization surveillance program. It is not intended to diagnose MRSA infection nor to guide or monitor treatment for MRSA infections. Test performance is not FDA approved in patients less than 37 years old. Performed at [facility], [address], [place], [place] [address]   Respiratory (~20 pathogens) panel by PCR     Status: Abnormal   Collection Time: 06/25/23  8:18 AM   Specimen: Nasopharyngeal Swab; Respiratory  Result Value Ref Range Status   Adenovirus NOT DETECTED NOT DETECTED Final   Coronavirus 229E NOT DETECTED NOT DETECTED Final    Comment: (NOTE) The Coronavirus on the Respiratory Panel, DOES NOT test for the novel  Coronavirus (2019 nCoV)    Coronavirus HKU1 NOT DETECTED NOT DETECTED Final   Coronavirus NL63 NOT DETECTED NOT DETECTED Final   Coronavirus OC43 NOT DETECTED NOT DETECTED Final   Metapneumovirus NOT DETECTED NOT DETECTED Final   Rhinovirus / Enterovirus DETECTED (A) NOT DETECTED Final   Influenza A NOT DETECTED NOT DETECTED Final   Influenza B NOT DETECTED NOT DETECTED Final   Parainfluenza Virus 1 NOT DETECTED NOT DETECTED Final   Parainfluenza Virus 2 NOT DETECTED NOT DETECTED Final   Parainfluenza Virus 3 NOT DETECTED NOT DETECTED Final   Parainfluenza Virus 4 NOT DETECTED NOT DETECTED Final    Respiratory Syncytial Virus NOT DETECTED NOT DETECTED Final   Bordetella pertussis NOT DETECTED NOT DETECTED Final   Bordetella Parapertussis NOT DETECTED NOT DETECTED Final   Chlamydophila pneumoniae NOT DETECTED NOT DETECTED Final   Mycoplasma pneumoniae NOT DETECTED NOT DETECTED Final    Comment: Performed at [facility], [address], [place], [place] [address]    Labs: CBC: No  results for input(s): "WBC", "NEUTROABS", "HGB", "HCT", "MCV", "PLT" in the last 168 hours. Basic Metabolic Panel: Recent Labs  Lab 06/28/23 0604 06/28/23 0739 06/28/23 1533 06/29/23 0549 06/29/23 0955 06/30/23 0525 07/01/23 0522 07/01/23 1000 07/02/23 0817 07/02/23 1320 07/02/23 1704 07/02/23 2037 07/03/23 0008 07/03/23 0406 07/03/23 0745  NA 119* 119*   < > 121*   < > 124* 117*   < > 121*   < > 128* 129* 131* 127* 129*  K 5.0 4.7  --  4.8  --  5.1 4.2  --  4.1  --   --   --   --   --   --   CL 77* 78*  --  80*  --  83* 82*  --  89*  --   --   --   --   --   --   CO2 31 30  --  30  --  33* 27  --  23  --   --   --   --   --   --   GLUCOSE 267* 246*  --  225*  --  139* 76  --  237*  --   --   --   --   --   --   BUN 33* 31*  --  28*  --  32* 29*  --  18  --   --   --   --   --   --   CREATININE 0.73 0.73  --  0.69  --  0.86 0.68  --  0.66  --   --   --   --   --   --   CALCIUM 8.5* 8.4*  --  8.5*  --  8.7* 8.1*  --  7.8*  --   --   --   --   --   --   PHOS 3.3  --   --   --   --   --   --   --   --   --   --   --   --   --   --    < > = values in this interval not displayed.   Liver Function Tests: Recent Labs  Lab 06/26/23 1243 06/28/23 0604 06/28/23 0739  AST 30  --  23  ALT 24  --  24  ALKPHOS 91  --  87  BILITOT 0.5  --  0.6  PROT 6.8  --  6.4*  ALBUMIN 3.1* 3.0* 3.0*   CBG: Recent Labs  Lab 07/02/23 1137 07/02/23 1614 07/02/23 2129 07/03/23 0729 07/03/23 0828  GLUCAP 85 264* 79 63* 197*    Discharge time spent: greater than 40 minutes.  Signed: Kendell Bane, MD [facility] 07/03/2023

## 2023-07-03 NOTE — TOC Transition Note (Signed)
Transition of Care [facility] - Discharge Note   Patient Details  Name: Sara Stafford MRN: [id number] Date of Birth: [birth year]  Transition of Care [facility]) CM/SW Contact:  Villa Herb, LCSWA Phone Number: 07/03/2023, 11:08 AM  Clinical Narrative:    CSW updated that pt is medically stable for D/C. CSW spoke to [place] with [facility] who states they are ready for pt to admit. CSW attempted to reach pts son to provide update, not able to make contact. D/C clinicals sent via HUB to [facility]. CSW provided RN with room and report numbers. TOC signing off.   Final next level of care: Skilled Nursing Facility Barriers to Discharge: Barriers Resolved   Patient Goals and CMS Choice Patient states their goals for this hospitalization and ongoing recovery are:: go to SNF CMS Medicare.gov Compare Post Acute Care list provided to:: Patient Choice offered to / list presented to : Patient Story ownership interest in [facility].provided to:: Patient    Discharge Placement              Patient chooses bed at: [facility] Patient to be transferred to facility by: Facility staff Name of family member notified: Unable to reach son Patient and family notified of of transfer: 07/03/23  Discharge Plan and Services Additional resources added to the After Visit Summary for   In-house Referral: Clinical Social Work Discharge Planning Services: CM Consult Post Acute Care Choice: Home Health                               Social Drivers of Health (SDOH) Interventions SDOH Screenings   Food Insecurity: No Food Insecurity (06/25/2023)  Housing: Low Risk  (06/25/2023)  Transportation Needs: No Transportation Needs (06/25/2023)  Utilities: Not At Risk (06/25/2023)  Financial Resource Strain: Low Risk  (05/05/2023)   Received from [facility]  Social Connections: Moderately Isolated (06/25/2023)  Tobacco Use: Medium Risk (06/25/2023)     Readmission Risk  Interventions    06/27/2023    3:33 PM  Readmission Risk Prevention Plan  Transportation Screening Complete  HRI or Home Care Consult Complete  Social Work Consult for Recovery Care Planning/Counseling Complete  Palliative Care Screening Not Applicable  Medication Review Oceanographer) Complete

## 2023-07-03 NOTE — Progress Notes (Signed)
Patient ID: Sara Stafford, female   DOB: [birth year], 78 y.o.   MRN: [id number] S: Feeling much better this morning. O:BP 103/72   Pulse 86   Temp 97.8 F (36.6 C) (Oral)   Resp (!) 25   Ht 5\' 5"  (1.651 m)   Wt 57 kg   SpO2 98%   BMI 20.91 kg/m   Intake/Output Summary (Last 24 hours) at 07/03/2023 1050 Last data filed at 07/03/2023 0900 Gross per 24 hour  Intake 2450.79 ml  Output 1200 ml  Net 1250.79 ml   Intake/Output: I/O last 3 completed shifts: In: 2612.3 [P.O.:1470; I.V.:792.3; IV Piggyback:350] Out: 1600 [Urine:1600]  Intake/Output this shift:  Total I/O In: 240 [P.O.:240] Out: -  Weight change: -2 kg Gen: NAD CVS: RRR Resp:CTA Abd: +BS, soft, NT/ND Ext: no edema  Recent Labs  Lab 06/26/23 1243 06/26/23 1703 06/27/23 1249 06/27/23 1807 06/28/23 0604 06/28/23 0739 06/28/23 1533 06/29/23 0549 06/29/23 0955 06/30/23 0525 07/01/23 0522 07/01/23 1000 07/02/23 0817 07/02/23 1320 07/02/23 1704 07/02/23 2037 07/03/23 0008 07/03/23 0406 07/03/23 0745  NA 115*   < > 117*   < > 119* 119*   < > 121*   < > 124* 117*   < > 121* 124* 128* 129* 131* 127* 129*  K 5.6*   < > 4.5  --  5.0 4.7  --  4.8  --  5.1 4.2  --  4.1  --   --   --   --   --   --   CL 77*   < > 77*  --  77* 78*  --  80*  --  83* 82*  --  89*  --   --   --   --   --   --   CO2 27   < > 27  --  31 30  --  30  --  33* 27  --  23  --   --   --   --   --   --   GLUCOSE 222*   < > 212*  --  267* 246*  --  225*  --  139* 76  --  237*  --   --   --   --   --   --   BUN 24*   < > 43*  --  33* 31*  --  28*  --  32* 29*  --  18  --   --   --   --   --   --   CREATININE 0.87   < > 0.83  --  0.73 0.73  --  0.69  --  0.86 0.68  --  0.66  --   --   --   --   --   --   ALBUMIN 3.1*  --   --   --  3.0* 3.0*  --   --   --   --   --   --   --   --   --   --   --   --   --   CALCIUM 8.3*   < > 8.5*  --  8.5* 8.4*  --  8.5*  --  8.7* 8.1*  --  7.8*  --   --   --   --   --   --   PHOS  --   --   --   --  3.3  --    --   --   --   --   --   --   --   --   --   --   --   --   --  AST 30  --   --   --   --  23  --   --   --   --   --   --   --   --   --   --   --   --   --   ALT 24  --   --   --   --  24  --   --   --   --   --   --   --   --   --   --   --   --   --    < > = values in this interval not displayed.   Liver Function Tests: Recent Labs  Lab 06/26/23 1243 06/28/23 0604 06/28/23 0739  AST 30  --  23  ALT 24  --  24  ALKPHOS 91  --  87  BILITOT 0.5  --  0.6  PROT 6.8  --  6.4*  ALBUMIN 3.1* 3.0* 3.0*   No results for input(s): "LIPASE", "AMYLASE" in the last 168 hours. No results for input(s): "AMMONIA" in the last 168 hours. CBC: No results for input(s): "WBC", "NEUTROABS", "HGB", "HCT", "MCV", "PLT" in the last 168 hours. Cardiac Enzymes: No results for input(s): "CKTOTAL", "CKMB", "CKMBINDEX", "TROPONINI" in the last 168 hours. CBG: Recent Labs  Lab 07/02/23 1137 07/02/23 1614 07/02/23 2129 07/03/23 0729 07/03/23 0828  GLUCAP 85 264* 79 63* 197*    Iron Studies: No results for input(s): "IRON", "TIBC", "TRANSFERRIN", "FERRITIN" in the last 72 hours. Studies/Results: No results found.  ALPRAZolam  0.25 mg Oral BID   amLODipine  10 mg Oral Daily   arformoterol  15 mcg Nebulization BID   budesonide (PULMICORT) nebulizer solution  0.5 mg Nebulization BID   calcium carbonate  1 tablet Oral Q breakfast   Chlorhexidine Gluconate Cloth  6 each Topical Daily   insulin aspart  0-20 Units Subcutaneous TID WC   insulin aspart  0-5 Units Subcutaneous QHS   insulin aspart  5 Units Subcutaneous TID WC   insulin glargine-yfgn  25 Units Subcutaneous Daily   metoprolol tartrate  50 mg Oral BID   polyethylene glycol  17 g Oral Daily   predniSONE  10 mg Oral Q breakfast   rosuvastatin  20 mg Oral QHS   senna-docusate  1 tablet Oral BID   sodium chloride  2 g Oral TID WC    BMET    Component Value Date/Time   NA 129 (L) 07/03/2023 0745   K 4.1 07/02/2023 0817   CL 89 (L)  07/02/2023 0817   CO2 23 07/02/2023 0817   GLUCOSE 237 (H) 07/02/2023 0817   BUN 18 07/02/2023 0817   CREATININE 0.66 07/02/2023 0817   CALCIUM 7.8 (L) 07/02/2023 0817   GFRNONAA >60 07/02/2023 0817   GFRAA >60 11/07/2017 1558   CBC    Component Value Date/Time   WBC 9.2 06/25/2023 0548   RBC 3.48 (L) 06/25/2023 0548   HGB 9.9 (L) 06/25/2023 0548   HCT 28.6 (L) 06/25/2023 0548   PLT 417 (H) 06/25/2023 0548   MCV 82.2 06/25/2023 0548   MCH 28.4 06/25/2023 0548   MCHC 34.6 06/25/2023 0548   RDW 12.6 06/25/2023 0548   LYMPHSABS 0.5 (L) 06/24/2023 2223   MONOABS 0.7 06/24/2023 2223   EOSABS 0.1 06/24/2023 2223   BASOSABS 0.0 06/24/2023 2223     Assessment/Plan: 1.  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  Urine sodium of 39 and urine osmolality of 420 suggestive of SIADH.  Has received tolvaptan several times.  Somewhat improved and then worsens again.  Mild symptoms on exam today. -Continue oral salt tabs, 2 g 3 times daily -Started hypertonic saline at 50 cc/h on 07/01/23 with improvement, but then stopped on 07/02/23 with drop in sodium.  Restarted yesterday and then stopped after sodium level reached 127-129 yesterday; currently off and sodium level stable at 129.   -Maintain free water restriction -Will need to follow up with her pcp after discharge.  Should be able to titrate off of NaCl tablets as her pulmonary issue resolves.  Ok to follow up with our office in 3-4 weeks if sodium level not normalized.    2.  Hyperkalemia:  -Resolved   3.  Acute hypoxic respiratory failure/COPD exacerbation: Positive for rhinovirus - bronchodilator therapy/inhaled corticosteroids as well as systemic corticosteroids per primary team  - note decreased respiratory reserve     4.  Hypertension -Continue current medications for now.  No adjustments today   Uncontrolled type 2 diabetes with hyperglycemia: Management per primary team  Irena Cords,  MD [facility] [phone]

## 2023-07-03 NOTE — Progress Notes (Signed)
Report given to [facility], LPN. IV removed from patient's right arm. Discharge instructions reviewed with patient. No concerns at this time. Patient transferred to [facility] by wheelchair

## 2023-07-03 NOTE — Care Management Important Message (Signed)
Important Message  Patient Details  Name: Sara Stafford MRN: [id number] Date of Birth: 09-26-1945   Important Message Given:  Yes - Medicare IM     Corey Harold 07/03/2023, 11:20 AM

## 2023-07-03 NOTE — Progress Notes (Signed)
Physical Therapy Treatment Patient Details Name: Sara Stafford MRN: [id number] DOB: Mar 21, 1946 Today's Date: 07/03/2023   History of Present Illness 78 year old female with a history of diabetes mellitus type 2, hypertension, hyperlipidemia, anxiety/depression, COPD, tobacco abuse in remission presenting with 3-week history of shortness of breath, wheezing, coughing, and chest congestion.  The patient went to see her PCP on 06/20/2023.  The patient received IM injection of Kenalog.  She was given a prescription for azithromycin and cefdinir.  She finished azithromycin.  She quit taking the cefdinir after 3 doses because she felt like it was making her symptoms worse.  She denied any fevers, chills, headache, chest pain, abdominal pain.  She has some nausea and posttussive emesis.  She had some loose stools without any hematochezia or melena.  She denies any abdominal pain, dysuria, hematuria.  She denies any hemoptysis.  Unfortunately, her shortness of breath continue to worsen.  She also noted some lower extremity edema.  As result, she presented for further evaluation and treatment.  She was noted to have oxygen saturation 95% on room air by EMS.    PT Comments  Reassessment, patient was agreeable to therapy. Needed min/mod assist for bed mobility, transfers and ambulation. Used RW for transfers and ambulation. Limited due to fatigue. Before standing patient had to wake up their feet. Required verbal/tactile cueing to move RW forward before taking a step to prevent from flipping over the front of the walker. Was able to perform exercises with maintaining balance in chair. Did have a hard time DF feet during calf pumps as ankles are stuck in PF. Patient was left in chair at conclusion of session. Patient will benefit from continued skilled physical therapy in hospital and recommended venue below to increase strength, balance, endurance for safe ADLs and gait.      If plan is discharge home,  recommend the following: A lot of help with walking and/or transfers;A lot of help with bathing/dressing/bathroom;Assistance with cooking/housework;Help with stairs or ramp for entrance   Can travel by private vehicle     No  Equipment Recommendations  None recommended by PT    Recommendations for Other Services       Precautions / Restrictions Precautions Precautions: Fall Restrictions Weight Bearing Restrictions Per Provider Order: No     Mobility  Bed Mobility Overal bed mobility: Needs Assistance Bed Mobility: Supine to Sit     Supine to sit: Contact guard     General bed mobility comments: from flat HOB. Patient Response: Cooperative  Transfers Overall transfer level: Needs assistance Equipment used: Rolling walker (2 wheels) Transfers: Sit to/from Stand, Bed to chair/wheelchair/BSC Sit to Stand: Min assist, Mod assist   Step pivot transfers: Min assist, Mod assist       General transfer comment: min/mod assist with RW, mod assist for power to stand and control descent. has to wake her feet up before moving    Ambulation/Gait Ambulation/Gait assistance: Mod assist, Min assist Gait Distance (Feet): 15 Feet   Gait Pattern/deviations: Step-to pattern, Step-through pattern, Decreased step length - right, Decreased dorsiflexion - right, Decreased dorsiflexion - left, Knee flexed in stance - right, Knee flexed in stance - left Gait velocity: slow     General Gait Details: step to pattern leading with RLE with ankle stuck in plantarflexion during IC and throughout stance phase, pt with moderate assist for RW management and cues for safe, turning back to bed. Pt with decreased R lateral weight shift throughout entire gait cycle.  Stairs             Wheelchair Mobility     Tilt Bed Tilt Bed Patient Response: Cooperative  Modified Rankin (Stroke Patients Only)       Balance Overall balance assessment: Needs assistance Sitting-balance support: No  upper extremity supported Sitting balance-Leahy Scale: Good Sitting balance - Comments: sitting EOB Postural control: Left lateral lean Standing balance support: Bilateral upper extremity supported, During functional activity, Reliant on assistive device for balance Standing balance-Leahy Scale: Poor Standing balance comment: left lateral shift in standing with R ankle in plantarflexion.                            Cognition Arousal: Alert Behavior During Therapy: WFL for tasks assessed/performed Overall Cognitive Status: Within Functional Limits for tasks assessed                                          Exercises General Exercises - Lower Extremity Ankle Circles/Pumps: Seated, AROM, Both, 10 reps Long Arc Quad: Seated, AROM, Both, 10 reps Hip Flexion/Marching: Seated, AROM, Both, 10 reps    General Comments        Pertinent Vitals/Pain Pain Assessment Pain Assessment: No/denies pain    Home Living                          Prior Function            PT Goals (current goals can now be found in the care plan section) Acute Rehab PT Goals Patient Stated Goal: to return home PT Goal Formulation: With patient Time For Goal Achievement: 07/12/23 Potential to Achieve Goals: Good    Frequency    Min 3X/week      PT Plan      Co-evaluation              AM-PAC PT "6 Clicks" Mobility   Outcome Measure  Help needed turning from your back to your side while in a flat bed without using bedrails?: None Help needed moving from lying on your back to sitting on the side of a flat bed without using bedrails?: None Help needed moving to and from a bed to a chair (including a wheelchair)?: A Lot Help needed standing up from a chair using your arms (e.g., wheelchair or bedside chair)?: A Lot Help needed to walk in hospital room?: A Lot Help needed climbing 3-5 steps with a railing? : Total 6 Click Score: 15    End of Session    Activity Tolerance: Patient tolerated treatment well Patient left: in chair;with call bell/phone within reach Nurse Communication: Mobility status PT Visit Diagnosis: Other abnormalities of gait and mobility (R26.89);Muscle weakness (generalized) (M62.81);Unsteadiness on feet (R26.81)     Time: 1610-9604 PT Time Calculation (min) (ACUTE ONLY): 29 min  Charges:    $Therapeutic Exercise: 8-22 mins $Therapeutic Activity: 8-22 mins PT General Charges $$ ACUTE PT VISIT: 1 Visit                     Jameson Morrow SPT

## 2023-07-04 ENCOUNTER — Non-Acute Institutional Stay (SKILLED_NURSING_FACILITY): Payer: Self-pay | Admitting: Internal Medicine

## 2023-07-04 ENCOUNTER — Encounter: Payer: Self-pay | Admitting: Internal Medicine

## 2023-07-04 DIAGNOSIS — E46 Unspecified protein-calorie malnutrition: Secondary | ICD-10-CM | POA: Insufficient documentation

## 2023-07-04 DIAGNOSIS — E440 Moderate protein-calorie malnutrition: Secondary | ICD-10-CM

## 2023-07-04 DIAGNOSIS — E1142 Type 2 diabetes mellitus with diabetic polyneuropathy: Secondary | ICD-10-CM

## 2023-07-04 DIAGNOSIS — E871 Hypo-osmolality and hyponatremia: Secondary | ICD-10-CM

## 2023-07-04 DIAGNOSIS — J9601 Acute respiratory failure with hypoxia: Secondary | ICD-10-CM

## 2023-07-04 DIAGNOSIS — E1165 Type 2 diabetes mellitus with hyperglycemia: Secondary | ICD-10-CM

## 2023-07-04 NOTE — Assessment & Plan Note (Signed)
Albumin 2.6 and total protein 6.1.  Nutritionist to consult at SNF.

## 2023-07-04 NOTE — Assessment & Plan Note (Signed)
Critical calcium of 6.5 documented on 06/25/2023 during hospitalization for profound hyponatremia with presentation sodium of 115.  Final calcium 7.8.  Check vitamin D level with next blood draw.

## 2023-07-04 NOTE — Progress Notes (Signed)
 NURSING HOME LOCATION:  [facility] ROOM NUMBER:  128 P  CODE STATUS:  Full Code  PCP:  Oneil PHEBE Pinal MD  This is a comprehensive admission note to this SNFperformed on this date less than 30 days from date of admission. Included are preadmission medical/surgical history; reconciled medication list; family history; social history and comprehensive review of systems.  Corrections and additions to the records were documented. Comprehensive physical exam was also performed. Additionally a clinical summary was entered for each active diagnosis pertinent to this admission in the Problem List to enhance continuity of care.  HPI: She was hospitalized 1/25 - 07/03/2023 presenting with a 3-week history of dyspnea, wheezing, chest congestion and cough.  She had seen her PCP on 1/21 and received an IM injection of Kenalog .  At that time she was given prescriptions for azithromycin  and cefdinir.  She did finish azithromycin  but quit taking the cefdinir after 3 doses as she thought it was making her symptoms worse.  Dyspnea continued to worsen and she noted lower extremity edema.  This is in the context of taking sodium chloride  twice daily for the past year because of hyponatremia. She had also been hospitalized with hyponatremia 2/09- 07/09/2022 which was attributed to Effexor . Normal saline infusion was initiated in the ED 06/24/23 for sodium of 115.  Urine osmolality was 412; serum osmolality 259; and urine sodium 25.  There was gradual improvement in the sodium with a final value of 129.  Nephrology consulted and recommended continuation of the sodium chloride  tablets.   Chest x-ray revealed increased interstitial markings.  For her respiratory symptoms bronchodilators and Solu-Medrol  were initiated.  Initial troponin was 50 and subsequent value 42.  This was attributed to demand ischemia.  BNP was 567.  Clinically she was felt to be volume overloaded and diuresis was initiated.  Tolvaptan   15 mg was administered x 2. Acute respiratory failure with hypoxia was clinically present related to COPD exacerbation and volume overload. Viral respiratory panel was negative except for positive rhino/enterovirus.  As needed DuoNeb bronchodilators therapy was continued and Brovana  and Pulmicort  initiated. She was able to be weaned off supplemental oxygen.   Glucoses while hospitalized ranged from a low of 79 up to a high of 411, both were outliers.  A1c was 9.5% on 1/26 indicating poorly controlled diabetes.  She received insulin  drip for a short period time on 1/26.  Metformin  and glipizide  were discontinued and NovoLog  5 units with meals was added. She complained of leg pain in the context of edema.  Venous Doppler was negative for DVT. Hypomagnesemia was repleted. Hyponatremia was felt to be multifactorial with fluid overload, SIADH, possible iatrogenic contribution and hyperosmolar hyponatremia. PT/OT consulted and recommended SNF placement for rehab.  Past medical and surgical history is extensive and includes COPD, fibromyalgia, GERD, essential hypertension, macular degeneration, history of depression, and diabetes with peripheral neuropathy, and dyslipidemia. Surgeries and procedures include abdominal hysterectomy, cholecystectomy, EGD, and colonoscopy.  Family history: reviewed, dramatic family history of diabetes present.  Social history: Nondrinker; former 50-pack-year smoker.   Review of systems: She knew the year, month, but not the day.  She identified the POTUS.  She thought she was in [place]. She stated that she been in the hospital because of congestion and low sodium.. She states that she was living independently PTA and was not falling. She stated that she was doing pretty good.  She complains of dry lips with discomfort she has associated with fever  blisters.  She continues to have some chest congestion but states this is improved.  She describes anxiety, especially  in the mornings.  This has been exacerbated by her husband's death 2025-01-23of this year.  She has numbness and tingling in her feet.  She states at home her glucoses were running in the 170 range fasting.  Constitutional: No fever, significant weight change  Eyes: No redness, discharge, pain, vision change ENT/mouth: No nasal congestion, purulent discharge, earache, change in hearing, sore throat  Cardiovascular: No chest pain, palpitations, paroxysmal nocturnal dyspnea, claudication, edema  Respiratory: No hemoptysis, significant snoring, apnea  Gastrointestinal: No heartburn, dysphagia, abdominal pain, nausea /vomiting, rectal bleeding, melena, change in bowels Genitourinary: No dysuria, hematuria, pyuria, incontinence, nocturia Musculoskeletal: No joint stiffness, joint swelling, pain Dermatologic: No rash, pruritus, change in appearance of skin Neurologic: No dizziness, headache, syncope, seizures Psychiatric: No significant insomnia, anorexia Endocrine: No change in hair/skin/nails, excessive hunger, excessive urination  Hematologic/lymphatic: No significant bruising, lymphadenopathy, abnormal bleeding Allergy/immunology: No itchy/watery eyes, significant sneezing, urticaria, angioedema  Physical exam:  Pertinent or positive findings: When initially seen she was asleep without snoring or frank apnea.  She did rouse easily.  Eyebrows are decreased in density.  Grade 1/2 systolic murmur suggested in the base.  First heart sound is increased.  Breath sounds are decreased ; the lungs exhibit the silent character of COPD.  Pedal pulses are decreased.  She has trace edema at the sock line.  There is marked ecchymoses over the dorsum of the hands.  General appearance: Adequately nourished; no acute distress, increased work of breathing is present.   Lymphatic: No lymphadenopathy about the head, neck, axilla. Eyes: No conjunctival inflammation or lid edema is present. There is no scleral  icterus. Ears:  External ear exam shows no significant lesions or deformities.   Nose:  External nasal examination shows no deformity or inflammation. Nasal mucosa are pink and moist without lesions, exudates Oral exam: Lips and gums are healthy appearing.There is no oropharyngeal erythema or exudate. Neck:  No thyromegaly, masses, tenderness noted.    Heart:  Normal rate and regular rhythm. S2 normal without gallop,  click, rub.  Lungs:  without wheezes, rhonchi, rales, rubs. Abdomen: Bowel sounds are normal.  Abdomen is soft and nontender with no organomegaly, hernias, masses. GU: Deferred  Extremities:  No cyanosis, clubbing. Neurologic exam:  Balance, Rhomberg, finger to nose testing could not be completed due to clinical state Skin: Warm & dry w/o tenting. No significant rash.  See clinical summary under each active problem in the Problem List with associated updated therapeutic plan

## 2023-07-04 NOTE — Assessment & Plan Note (Signed)
On current pulmonary toilet regimen; O2 sats are 97% on room air.  Continue to monitor without regimen change.

## 2023-07-04 NOTE — Assessment & Plan Note (Addendum)
 1/25 - 07/03/2023 initial sodium 115 in the clinical context of volume overload with BNP 567.  Urine osmolality 412; serum osmolality 259; urine sodium 25.  Tolvaptan  15 mg x 2.  Nephrology consulted recommend continuation of sodium chloride  tablets.  Final sodium value 129. Monitor electrolytes at SNF.

## 2023-07-04 NOTE — Patient Instructions (Signed)
 See assessment and plan under each diagnosis in the problem list and acutely for this visit

## 2023-07-04 NOTE — Assessment & Plan Note (Addendum)
 Metformin  and glipizide  were discontinued during hospitalization for profound hyponatremia.  She received insulin  drip for short period of time and subsequently NovoLog  5 units with meals.  Glucoses ranged from a low of 79 up to high of 411; both were outliers.  A1c of 9.5% on 06/25/2023 documents poor diabetic control.  Despite this there is not advanced CKD; e GFR was greater than 60. Monitor FBS at SNF; goal is less than 250 at a minimum.  A1c goal is less than 8% minimally and ideally less than 7 as long as there is no hypoglycemia.

## 2023-07-06 ENCOUNTER — Other Ambulatory Visit (HOSPITAL_COMMUNITY)
Admission: RE | Admit: 2023-07-06 | Discharge: 2023-07-06 | Disposition: A | Discharge status: Home / Self Care | Payer: Medicare Other | Source: Skilled Nursing Facility | Attending: Adult Health | Admitting: Adult Health

## 2023-07-06 DIAGNOSIS — E871 Hypo-osmolality and hyponatremia: Secondary | ICD-10-CM | POA: Insufficient documentation

## 2023-07-06 LAB — CBC WITH DIFFERENTIAL/PLATELET
Abs Immature Granulocytes: 0.18 10*3/uL — ABNORMAL HIGH (ref 0.00–0.07)
Basophils Absolute: 0 10*3/uL (ref 0.0–0.1)
Basophils Relative: 0 %
Eosinophils Absolute: 0.1 10*3/uL (ref 0.0–0.5)
Eosinophils Relative: 1 %
HCT: 35.5 % — ABNORMAL LOW (ref 36.0–46.0)
Hemoglobin: 11.7 g/dL — ABNORMAL LOW (ref 12.0–15.0)
Immature Granulocytes: 2 %
Lymphocytes Relative: 7 %
Lymphs Abs: 0.9 10*3/uL (ref 0.7–4.0)
MCH: 28.5 pg (ref 26.0–34.0)
MCHC: 33 g/dL (ref 30.0–36.0)
MCV: 86.6 fL (ref 80.0–100.0)
Monocytes Absolute: 0.9 10*3/uL (ref 0.1–1.0)
Monocytes Relative: 8 %
Neutro Abs: 9.7 10*3/uL — ABNORMAL HIGH (ref 1.7–7.7)
Neutrophils Relative %: 82 %
Platelets: 501 10*3/uL — ABNORMAL HIGH (ref 150–400)
RBC: 4.1 MIL/uL (ref 3.87–5.11)
RDW: 13.3 % (ref 11.5–15.5)
WBC: 11.8 10*3/uL — ABNORMAL HIGH (ref 4.0–10.5)
nRBC: 0 % (ref 0.0–0.2)

## 2023-07-06 LAB — BASIC METABOLIC PANEL
Anion gap: 11 (ref 5–15)
BUN: 17 mg/dL (ref 8–23)
CO2: 26 mmol/L (ref 22–32)
Calcium: 8.7 mg/dL — ABNORMAL LOW (ref 8.9–10.3)
Chloride: 90 mmol/L — ABNORMAL LOW (ref 98–111)
Creatinine, Ser: 0.63 mg/dL (ref 0.44–1.00)
GFR, Estimated: >60 mL/min (ref >60)
Glucose, Bld: 69 mg/dL — ABNORMAL LOW (ref 70–99)
Potassium: 4.1 mmol/L (ref 3.5–5.1)
Sodium: 127 mmol/L — ABNORMAL LOW (ref 135–145)

## 2023-07-10 ENCOUNTER — Encounter: Payer: Self-pay | Admitting: Adult Health

## 2023-07-10 DIAGNOSIS — I700 Atherosclerosis of aorta: Secondary | ICD-10-CM | POA: Insufficient documentation

## 2023-07-10 DIAGNOSIS — E1169 Type 2 diabetes mellitus with other specified complication: Secondary | ICD-10-CM | POA: Insufficient documentation

## 2023-07-10 DIAGNOSIS — I152 Hypertension secondary to endocrine disorders: Secondary | ICD-10-CM | POA: Insufficient documentation

## 2023-07-10 DIAGNOSIS — F339 Major depressive disorder, recurrent, unspecified: Secondary | ICD-10-CM | POA: Insufficient documentation

## 2023-07-10 DIAGNOSIS — D649 Anemia, unspecified: Secondary | ICD-10-CM | POA: Insufficient documentation

## 2023-07-17 ENCOUNTER — Other Ambulatory Visit (HOSPITAL_COMMUNITY)
Admission: RE | Admit: 2023-07-17 | Discharge: 2023-07-17 | Disposition: A | Discharge status: Home / Self Care | Payer: Medicare Other | Source: Skilled Nursing Facility | Attending: Adult Health | Admitting: Adult Health

## 2023-07-17 ENCOUNTER — Encounter: Payer: Self-pay | Admitting: Adult Health

## 2023-07-17 ENCOUNTER — Non-Acute Institutional Stay (SKILLED_NURSING_FACILITY): Payer: Self-pay | Admitting: Adult Health

## 2023-07-17 DIAGNOSIS — E1159 Type 2 diabetes mellitus with other circulatory complications: Secondary | ICD-10-CM | POA: Insufficient documentation

## 2023-07-17 DIAGNOSIS — E1100 Type 2 diabetes mellitus with hyperosmolarity without nonketotic hyperglycemic-hyperosmolar coma (NKHHC): Secondary | ICD-10-CM

## 2023-07-17 LAB — CBC WITH DIFFERENTIAL/PLATELET
Abs Immature Granulocytes: 0.06 10*3/uL (ref 0.00–0.07)
Basophils Absolute: 0 10*3/uL (ref 0.0–0.1)
Basophils Relative: 1 %
Eosinophils Absolute: 0.1 10*3/uL (ref 0.0–0.5)
Eosinophils Relative: 1 %
HCT: 34.9 % — ABNORMAL LOW (ref 36.0–46.0)
Hemoglobin: 10.6 g/dL — ABNORMAL LOW (ref 12.0–15.0)
Immature Granulocytes: 1 %
Lymphocytes Relative: 10 %
Lymphs Abs: 0.8 10*3/uL (ref 0.7–4.0)
MCH: 28 pg (ref 26.0–34.0)
MCHC: 30.4 g/dL (ref 30.0–36.0)
MCV: 92.3 fL (ref 80.0–100.0)
Monocytes Absolute: 0.3 10*3/uL (ref 0.1–1.0)
Monocytes Relative: 5 %
Neutro Abs: 6.2 10*3/uL (ref 1.7–7.7)
Neutrophils Relative %: 82 %
Platelets: 352 10*3/uL (ref 150–400)
RBC: 3.78 MIL/uL — ABNORMAL LOW (ref 3.87–5.11)
RDW: 14.4 % (ref 11.5–15.5)
WBC: 7.5 10*3/uL (ref 4.0–10.5)
nRBC: 0 % (ref 0.0–0.2)

## 2023-07-17 LAB — BASIC METABOLIC PANEL
Anion gap: 11 (ref 5–15)
BUN: 18 mg/dL (ref 8–23)
CO2: 28 mmol/L (ref 22–32)
Calcium: 8.8 mg/dL — ABNORMAL LOW (ref 8.9–10.3)
Chloride: 99 mmol/L (ref 98–111)
Creatinine, Ser: 0.77 mg/dL (ref 0.44–1.00)
GFR, Estimated: >60 mL/min (ref >60)
Glucose, Bld: 233 mg/dL — ABNORMAL HIGH (ref 70–99)
Potassium: 4.5 mmol/L (ref 3.5–5.1)
Sodium: 138 mmol/L (ref 135–145)

## 2023-07-17 NOTE — Progress Notes (Unsigned)
Location:  [facility] Room Number: 128 Place of Service:  SNF (31)   CODE STATUS: full   Allergies  Allergen Reactions   Empagliflozin Nausea And Vomiting and Nausea Only   Ozempic (0.25 Or 0.5 Mg-Dose) [Semaglutide(0.25 Or 0.5mg -Dos)] Nausea And Vomiting   Semaglutide Nausea Only   Venlafaxine Other (See Comments)    Low sodium   Codeine Nausea And Vomiting   Doxycycline Other (See Comments)    REACTION: thrush  thrush   Mirtazapine Other (See Comments)    Significant fatigue at low-dose    Chief Complaint  Patient presents with   Acute Visit    Patient concerns     HPI:  She is presently taking insulin for her poorly controlled diabetes. Her hgb A1c is 9.5.  she states that she will not take insulin upon discharge from this facility. Stating that she cannot see and cannot count the clicks on the pens. She states that she will not be able to tell them apart. She is wanting her po medications restarted.   Past Medical History:  Diagnosis Date   Arthritis    COPD (chronic obstructive pulmonary disease) (HCC)    no change with spiriva, poor tolerance of advair   Depressive disorder, not elsewhere classified    Diabetes mellitus    Fatigue    Fibromyalgia    GERD (gastroesophageal reflux disease)    Hyperlipidemia    Hypertension    Macular degeneration    Routine general medical examination at a health care facility     Past Surgical History:  Procedure Laterality Date   ABDOMINAL HYSTERECTOMY     partial   CATARACT EXTRACTION Left 02/09/2015   CATARACT EXTRACTION Right 01/26/2015   CHOLECYSTECTOMY N/A 02/16/2022   Procedure: LAPAROSCOPIC CHOLECYSTECTOMY WITH INTRAOPERATIVE CHOLANGIOGRAM;  Surgeon: Earline Mayotte, MD;  Location: [facility] ORS;  Service: General;  Laterality: N/ASonda Rumble, RNFA to assist   COLONOSCOPY WITH PROPOFOL N/A 04/28/2021   Procedure: COLONOSCOPY WITH PROPOFOL;  Surgeon: Earline Mayotte, MD;  Location:  [facility] ENDOSCOPY;  Service: Endoscopy;  Laterality: N/A;   ESOPHAGOGASTRODUODENOSCOPY (EGD) WITH PROPOFOL N/A 04/28/2021   Procedure: ESOPHAGOGASTRODUODENOSCOPY (EGD) WITH PROPOFOL;  Surgeon: Earline Mayotte, MD;  Location: [facility] ENDOSCOPY;  Service: Endoscopy;  Laterality: N/A;    Social History   Socioeconomic History   Marital status: Married    Spouse name: Not on file   Number of children: 2   Years of education: Not on file   Highest education level: Not on file  Occupational History   Occupation: retired    Associate Professor: RETIRED    Comment: [employer]  Tobacco Use   Smoking status: Former    Current packs/day: 0.00    Average packs/day: 1 pack/day for 50.0 years (50.0 ttl pk-yrs)    Types: Cigarettes    Start date: 06/06/1960    Quit date: 06/06/2010    Years since quitting: 13.1   Smokeless tobacco: Never  Vaping Use   Vaping status: Never Used  Substance and Sexual Activity   Alcohol use: No    Alcohol/week: 0.0 standard drinks of alcohol   Drug use: No   Sexual activity: Not Currently  Other Topics Concern   Not on file  Social History Narrative   Children aged 16, and 94, expecting 2nd grandchild   Social Drivers of Corporate investment banker Strain: Low Risk  (05/05/2023)   Received from [facility]   Overall  Financial Resource Strain (CARDIA)    Difficulty of Paying Living Expenses: Not hard at all  Food Insecurity: No Food Insecurity (06/25/2023)   Hunger Vital Sign    Worried About Running Out of Food in the Last Year: Never true    Ran Out of Food in the Last Year: Never true  Transportation Needs: No Transportation Needs (06/25/2023)   PRAPARE - Administrator, Civil Service (Medical): No    Lack of Transportation (Non-Medical): No  Physical Activity: Not on file  Stress: Not on file  Social Connections: Moderately Isolated (06/25/2023)   Social Connection and Isolation Panel [NHANES]    Frequency of Communication with  Friends and Family: More than three times a week    Frequency of Social Gatherings with Friends and Family: More than three times a week    Attends Religious Services: More than 4 times per year    Active Member of [employer] or Organizations: No    Attends Banker Meetings: Never    Marital Status: Widowed  Intimate Partner Violence: Not At Risk (06/25/2023)   Humiliation, Afraid, Rape, and Kick questionnaire    Fear of Current or Ex-Partner: No    Emotionally Abused: No    Physically Abused: No    Sexually Abused: No   Family History  Problem Relation Age of Onset   Stroke Father 2   Diabetes Father    Asthma Father    Liver cancer Sister 83   Pancreatic cancer Sister 44   Brain cancer Brother    Diabetes Sister    Diabetes Sister    Diabetes Sister    Diabetes Sister    Diabetes Sister    Diabetes Sister    Diabetes Sister    Diabetes Paternal Grandmother    Breast cancer Maternal Aunt 31      VITAL SIGNS BP 112/63   Pulse 99   Temp 97.7 F (36.5 C)   Resp 20   Ht 5\' 6"  (1.676 m)   Wt 120 lb 12.8 oz (54.8 kg)   SpO2 97%   BMI 19.50 kg/m   Outpatient Encounter Medications as of 07/17/2023  Medication Sig   albuterol (VENTOLIN HFA) 108 (90 Base) MCG/ACT inhaler Inhale 2 puffs into the lungs every 4 (four) hours as needed.   amLODipine (NORVASC) 10 MG tablet Take 1 tablet (10 mg total) by mouth daily.   calcium carbonate (OS-CAL - DOSED IN MG OF ELEMENTAL CALCIUM) 1250 (500 Ca) MG tablet Take 1 tablet (1,250 mg total) by mouth daily with breakfast.   insulin aspart (NOVOLOG) 100 UNIT/ML injection Inject 5 Units into the skin 3 (three) times daily with meals. For cbg >150   insulin glargine-yfgn (SEMGLEE) 100 UNIT/ML injection Inject 0.2 mLs (20 Units total) into the skin daily.   metoprolol tartrate (LOPRESSOR) 25 MG tablet Take 1 tablet (25 mg total) by mouth 2 (two) times daily.   Multiple Vitamins-Minerals (PRESERVISION/LUTEIN) CAPS Take 1 capsule by  mouth as directed.   ondansetron (ZOFRAN-ODT) 4 MG disintegrating tablet Take 4 mg by mouth every 8 (eight) hours as needed.   predniSONE (DELTASONE) 10 MG tablet Take 10 mg by mouth daily with breakfast.   rosuvastatin (CRESTOR) 20 MG tablet Take 20 mg by mouth at bedtime.   sodium chloride 1 g tablet Take 2 tablets (2 g total) by mouth 2 (two) times daily with a meal.   [DISCONTINUED] omeprazole (PRILOSEC OTC) 20 MG tablet Take 1 tablet (20 mg total)  by mouth daily as needed.   No facility-administered encounter medications on file as of 07/17/2023.     SIGNIFICANT DIAGNOSTIC EXAMS  TODAY  06-24-23: wbc 12.2; hgb 10.3; hct 28.7; mcv 82.8 plt 467; glucose 229; bun 12; creat 0.50 k+ 3.6; na++ 1115; ca 6.5 gfr >60 protein 6.1 albumin 2.7 BNP 567 06-25-23: tsh 1.326; hgb A1c 9.5 iron 33; tibc 264; vitamin B 12: 795; folate 11.9 07-02-23: glucose 237; bun 18; creat 0.66; k+ 4.1; na++ 121; ca 7.8 gfr >60 pre-albumin 23; cortisol 22.1   Review of Systems  Constitutional:  Negative for malaise/fatigue.  Respiratory:  Negative for cough and shortness of breath.   Cardiovascular:  Negative for chest pain, palpitations and leg swelling.  Gastrointestinal:  Negative for abdominal pain, constipation and heartburn.  Musculoskeletal:  Negative for back pain, joint pain and myalgias.  Skin: Negative.   Neurological:  Negative for dizziness.  Psychiatric/Behavioral:  The patient is not nervous/anxious.    Physical Exam Constitutional:      General: She is not in acute distress.    Appearance: She is underweight. She is not diaphoretic.  Neck:     Thyroid: No thyromegaly.  Cardiovascular:     Rate and Rhythm: Normal rate and regular rhythm.     Pulses: Normal pulses.     Heart sounds: Normal heart sounds.  Pulmonary:     Effort: Pulmonary effort is normal. No respiratory distress.     Breath sounds: Normal breath sounds.  Abdominal:     General: Bowel sounds are normal. There is no distension.      Palpations: Abdomen is soft.     Tenderness: There is no abdominal tenderness.  Musculoskeletal:        General: Normal range of motion.     Cervical back: Neck supple.     Right lower leg: No edema.     Left lower leg: No edema.  Lymphadenopathy:     Cervical: No cervical adenopathy.  Skin:    General: Skin is warm and dry.  Neurological:     Mental Status: She is alert. Mental status is at baseline.  Psychiatric:        Mood and Affect: Mood normal.     ASSESSMENT/ PLAN:  TODAY  Poorly controlled type 2 diabetes mellitus with hyperosmolarity  Will stop all insulin per her request Will start metformin xr 1 gm daily  Januvia 50 mg daily  At this time will not restart glipizide due to [facility] list.     Synthia Innocent NP [facility]   call [phone]

## 2023-07-20 ENCOUNTER — Non-Acute Institutional Stay: Payer: Self-pay | Admitting: Internal Medicine

## 2023-07-20 ENCOUNTER — Encounter: Payer: Self-pay | Admitting: Internal Medicine

## 2023-07-20 DIAGNOSIS — R600 Localized edema: Secondary | ICD-10-CM | POA: Insufficient documentation

## 2023-07-20 DIAGNOSIS — E1100 Type 2 diabetes mellitus with hyperosmolarity without nonketotic hyperglycemic-hyperosmolar coma (NKHHC): Secondary | ICD-10-CM

## 2023-07-20 DIAGNOSIS — E871 Hypo-osmolality and hyponatremia: Secondary | ICD-10-CM

## 2023-07-20 DIAGNOSIS — F339 Major depressive disorder, recurrent, unspecified: Secondary | ICD-10-CM

## 2023-07-20 DIAGNOSIS — J9601 Acute respiratory failure with hypoxia: Secondary | ICD-10-CM

## 2023-07-20 NOTE — Assessment & Plan Note (Signed)
She is on amlodipine; but the most likely cause of her peripheral edema is the sodium supplement.  She continues to elevate her legs; compression hose may be of benefit.  Endocrinology consult may provide alternative therapy to the high dose sodium supplementation.

## 2023-07-20 NOTE — Assessment & Plan Note (Signed)
She focuses more on anxiety and actually requested an increase in her alprazolam to 1 pill twice a day from a half twice daily as needed.  I did discuss the risk of respiratory suppression with this agent.

## 2023-07-20 NOTE — Assessment & Plan Note (Addendum)
Glipizide is on the [facility] list due to risk of hypoglycemia.  At the time of tentative discharge she was on metformin at 1000 mg daily and Januvia 50 mg daily.  Endocrinology consultation to address the poorly controlled diabetes & recurrent hyponatremia recommended. Neuro & cardiovascular risks are increased 50% with A1c of 9.5%.

## 2023-07-20 NOTE — Progress Notes (Unsigned)
NURSING HOME LOCATION:  [facility] ROOM NUMBER:  128 P  CODE STATUS:  Full Code  PCP: Danella Penton, MD   This is a nursing facility follow up visit for tentative discharge to [facility].  Interim medical record and care since last SNF visit was updated with review of diagnostic studies and change in clinical status since last visit were documented.  HPI: She was admitted to [facility] on 05/01/2024 after being hospitalized 1/25 - 07/03/2023 with hyponatremia.  In the ED her sodium was 115 and normal saline infusion was initiated.  Urine osmolality was 412  serum osmolality 250 , & urine sodium 25.  Serially there was a gradual improvement in hyponatremia with a final value prior to discharge of 129.  Nephrology consulted and recommended continuing sodium chloride tablets. She received bronchodilators and Solu-Medrol for a 3-week history of dyspnea, wheezing, chest congestion, and cough due to clinical acute respiratory failure with hypoxia in the context of COPD exacerbation.  Chest x-ray revealed increased interstitial markings without acute infiltrate. Troponin peaked at 50 , attributed to demand ischemia.  BNP was 567.  Diuresis was initiated for clinical volume overload.  Tolvaptan 15 mg was administered x 2.   Clinically there was an improvement in her respiratory status and she was able to be weaned off supplemental oxygen. While hospitalized glucoses ranged from a low of 79 up to high of 412; both of these values were outliers.  A1c was 9.5% on 1/26 documenting poorly controlled diabetes.  She did receive insulin drip for a short period of time while hospitalized.  Glipizide and metformin were discontinued and NovoLog 5 units with meals added. Hyponatremia was felt to be multifactorial in the context of volume overload, SIADH, possible iatrogenic component and hyperosmolar hyponatremia due to the poorly controlled diabetes. She was discharged here  for rehab.  At this facility she was on short acting insulin before meals and Semglee 20 units daily.  The tentative plan was for her to return home.She did not want to take insulin and  her  macular degeneration would prevent self administration of insulin. She wanted to have her sulfonylurea restarted.  As this drug is on the [place] list, metformin 1000 units daily and Januvia 50 mg daily were initiated.  Following this glucoses remained in the high 200s on average; the range was  69 up to 459; both outliers.  She is on prednisone 10 mg daily which is undoubtably impacting glucose control.  Because of the hyponatremia; she was not felt to be a candidate for sodium/glucose co- transport agent. Also listed as allergies or intolerances were Empagliflozin, Ozempic, and semaglutide. Follow-up labs were completed 2/17 here at the facility; sodium was 138, creatinine 0.77, and GFR greater than 60.  Although no bleeding dyscrasias reported here at the SNF there has been slight progression of her normochromic, normocytic anemia with H/H of 10.6/34.9. Because of her complex history with possible SIADH and poorly controlled diabetes with significant increase in neuro and cardiovascular risk; Endocrinology follow-up is indicated.  This will be recommended to her PCP.  Review of systems: She validates progressive vision loss over the last 7 months.  She has seen a specialist from [facility] and diagnosed the MD-macular degeneration.  Vision loss is profound to the point that she cannot read or site for the time from o'clock.  She describes exertional dyspnea.  She also states that she is short of breath if she is supine.  She does not describe specific paroxysmal nocturnal dyspnea.  She describes recent chest congestion and occasional sputum production.  She has been using the albuterol with benefit.  Her smoking history was reviewed; apparently she smoked from age 71 up to 61 approximately half a pack a day plus.  She describes  shoulder discomfort which is responsive to Tylenol.  She is concerned about edema of her feet and she believes of her hands.  She describes dry mouth and increased thirst.  Anxiety is a major issue since she lost her husband in January of this year.  She actually requested an increase in her alprazolam to 1 pill twice a day.  Constitutional: No fever, significant weight change, fatigue  Eyes: No redness, discharge, pain, vision change ENT/mouth: No nasal congestion,  purulent discharge, earache, change in hearing, sore throat  Cardiovascular: No chest pain, palpitations, paroxysmal nocturnal dyspnea, claudication, edema  Respiratory: No cough, sputum production, hemoptysis, DOE, significant snoring, apnea   Gastrointestinal: No heartburn, dysphagia, abdominal pain, nausea /vomiting, rectal bleeding, melena, change in bowels Genitourinary: No dysuria, hematuria, pyuria, incontinence, nocturia Musculoskeletal: No joint stiffness, joint swelling, weakness, pain Dermatologic: No rash, pruritus, change in appearance of skin Neurologic: No dizziness, headache, syncope, seizures, numbness, tingling Psychiatric: No significant anxiety, depression, insomnia, anorexia Endocrine: No change in hair/skin/nails, excessive thirst, excessive hunger, excessive urination  Hematologic/lymphatic: No significant bruising, lymphadenopathy, abnormal bleeding Allergy/immunology: No itchy/watery eyes, significant sneezing, urticaria, angioedema  Physical exam:  Pertinent or positive findings: She has a blank stare.  Eyebrows are decreased in intensity.  The lower lids are puffy.  Heart sounds are distant; there is a slight tachycardia.  Breath sounds are decreased.  1+ edema is noted.  Pedal pulses are not palpable.  Interosseous wasting is noted; I do not appreciate significant edema of the hands.  The third left finger is deviated laterally under the fourth digit.  General appearance: Adequately nourished; no acute  distress, increased work of breathing is present.   Lymphatic: No lymphadenopathy about the head, neck, axilla. Eyes: No conjunctival inflammation or lid edema is present. There is no scleral icterus. Ears:  External ear exam shows no significant lesions or deformities.   Nose:  External nasal examination shows no deformity or inflammation. Nasal mucosa are pink and moist without lesions, exudates Oral exam:  Lips and gums are healthy appearing. There is no oropharyngeal erythema or exudate. Neck:  No thyromegaly, masses, tenderness noted.    Heart:  Normal rate and regular rhythm. S1 and S2 normal without gallop, murmur, click, rub .  Lungs: Chest clear to auscultation without wheezes, rhonchi, rales, rubs. Abdomen: Bowel sounds are normal. Abdomen is soft and nontender with no organomegaly, hernias, masses. GU: Deferred  Extremities:  No cyanosis, clubbing, edema  Neurologic exam : Cn 2-7 intact Strength equal  in upper & lower extremities Balance, Rhomberg, finger to nose testing could not be completed due to clinical state Deep tendon reflexes are equal Skin: Warm & dry w/o tenting. No significant lesions or rash.  See summary under each active problem in the Problem List with associated updated therapeutic plan

## 2023-07-20 NOTE — Patient Instructions (Addendum)
See assessment and plan under each diagnosis in the problem list and acutely for this visit. Medication list from Epic & Matrix were reconciled as noted below:  Acetaminophen 325 mg 2 tablets every 6 hours as needed Albuterol 1-2 puffs every 4 hours as needed Alprazolam 0.25 mg 1/2 pill twice a day as needed Amlodipine 10 mg daily Rosuvastatin 20 mg nightly Januvia 50 mg daily Metformin ER 500 mg 2 pills after lunch Metroprolol 25 mg twice daily Zofran 4 mg every 8 hours as needed for nausea Calcium 500 mg once daily Prednisone 10 mg daily PreserVision lidocaine due to 6-90-0 0.8-5 mg daily Sodium chloride 1000 mg 2 tablets twice daily with food at 9 AM and 6 PM. Because of recurrent hyponatremia and uncontrolled diabetes with multiple diabetic medication intolerances or limitations due to medical issues; Endocrinology consultation recommended.

## 2023-07-20 NOTE — Assessment & Plan Note (Addendum)
Current O2 sats are excellent on room air.  She has albuterol 1-2 puffs every 4 hours as needed ordered.  She remains on 10 mg of prednisone daily. Risk of respiratory suppression with alprazolam discussed. If symptoms persist or progress; albuterol could be changed to DuoNeb MDI.

## 2023-07-20 NOTE — Assessment & Plan Note (Signed)
07/17/2023 sodium 138, creatinine 0.77, and GFR greater than 60.  Endocrinology consult recommended to address the poorly controlled diabetes with limited therapeutic options and recurrent symptomatic hyponatremia requiring repeated hospitalizations.

## 2023-07-21 ENCOUNTER — Encounter: Payer: Self-pay | Admitting: Adult Health

## 2023-07-21 ENCOUNTER — Non-Acute Institutional Stay (SKILLED_NURSING_FACILITY): Payer: Self-pay | Admitting: Adult Health

## 2023-07-21 DIAGNOSIS — I152 Hypertension secondary to endocrine disorders: Secondary | ICD-10-CM

## 2023-07-21 DIAGNOSIS — E1159 Type 2 diabetes mellitus with other circulatory complications: Secondary | ICD-10-CM

## 2023-07-21 DIAGNOSIS — E440 Moderate protein-calorie malnutrition: Secondary | ICD-10-CM

## 2023-07-21 DIAGNOSIS — I700 Atherosclerosis of aorta: Secondary | ICD-10-CM

## 2023-07-21 NOTE — Progress Notes (Signed)
Location:  [facility] Room Number: 130 Place of Service:  SNF (31)   CODE STATUS: full   Allergies  Allergen Reactions   Empagliflozin Nausea And Vomiting and Nausea Only   Ozempic (0.25 Or 0.5 Mg-Dose) [Semaglutide(0.25 Or 0.5mg -Dos)] Nausea And Vomiting   Semaglutide Nausea Only   Venlafaxine Other (See Comments)    Low sodium   Codeine Nausea And Vomiting   Doxycycline Other (See Comments)    REACTION: thrush  thrush   Mirtazapine Other (See Comments)    Significant fatigue at low-dose    Chief Complaint  Patient presents with   Acute Visit    Care plan meeting     HPI:  We have come together for her care plan meeting. BIMS 15/15 mood 12/30: depression. Uses wheelchair without falls. She requires supervision with her adl care. She is occasional bladder incontinence and is continent of bowel. Dietary: setup for meals; regular diet; appetite 26-100% weight is 117 pounds. Therapy: ambulate 150 feet with rolling walker and supervision; upper body supervision; lower body min assist; step with contact guard; brp supervision. She is unable to maintain fluid restriction. She will continue to be followed for her chronic illnesses including:  Hypertension associated with type 2 diabetes mellitus    Aortic atherosclerosis    Moderate protein calorie malnutrition  Past Medical History:  Diagnosis Date   Arthritis    COPD (chronic obstructive pulmonary disease) (HCC)    no change with spiriva, poor tolerance of advair   Depressive disorder, not elsewhere classified    Diabetes mellitus    Fatigue    Fibromyalgia    GERD (gastroesophageal reflux disease)    Hyperlipidemia    Hypertension    Macular degeneration    Routine general medical examination at a health care facility     Past Surgical History:  Procedure Laterality Date   ABDOMINAL HYSTERECTOMY     partial   CATARACT EXTRACTION Left 02/09/2015   CATARACT EXTRACTION Right 01/26/2015    CHOLECYSTECTOMY N/A 02/16/2022   Procedure: LAPAROSCOPIC CHOLECYSTECTOMY WITH INTRAOPERATIVE CHOLANGIOGRAM;  Surgeon: Earline Mayotte, MD;  Location: [facility] ORS;  Service: General;  Laterality: N/ASonda Rumble, RNFA to assist   COLONOSCOPY WITH PROPOFOL N/A 04/28/2021   Procedure: COLONOSCOPY WITH PROPOFOL;  Surgeon: Earline Mayotte, MD;  Location: [facility] ENDOSCOPY;  Service: Endoscopy;  Laterality: N/A;   ESOPHAGOGASTRODUODENOSCOPY (EGD) WITH PROPOFOL N/A 04/28/2021   Procedure: ESOPHAGOGASTRODUODENOSCOPY (EGD) WITH PROPOFOL;  Surgeon: Earline Mayotte, MD;  Location: [facility] ENDOSCOPY;  Service: Endoscopy;  Laterality: N/A;    Social History   Socioeconomic History   Marital status: Married    Spouse name: Not on file   Number of children: 2   Years of education: Not on file   Highest education level: Not on file  Occupational History   Occupation: retired    Associate Professor: RETIRED    Comment: [employer]  Tobacco Use   Smoking status: Former    Current packs/day: 0.00    Average packs/day: 1 pack/day for 50.0 years (50.0 ttl pk-yrs)    Types: Cigarettes    Start date: 06/06/1960    Quit date: 06/06/2010    Years since quitting: 13.1   Smokeless tobacco: Never  Vaping Use   Vaping status: Never Used  Substance and Sexual Activity   Alcohol use: No    Alcohol/week: 0.0 standard drinks of alcohol   Drug use: No   Sexual activity: Not Currently  Other Topics  Concern   Not on file  Social History Narrative   Children aged 65, and 64, expecting 2nd grandchild   Social Drivers of Corporate investment banker Strain: Low Risk  (05/05/2023)   Received from [facility]   Overall Financial Resource Strain (CARDIA)    Difficulty of Paying Living Expenses: Not hard at all  Food Insecurity: No Food Insecurity (06/25/2023)   Hunger Vital Sign    Worried About Running Out of Food in the Last Year: Never true    Ran Out of Food in the Last Year: Never true   Transportation Needs: No Transportation Needs (06/25/2023)   PRAPARE - Administrator, Civil Service (Medical): No    Lack of Transportation (Non-Medical): No  Physical Activity: Not on file  Stress: Not on file  Social Connections: Moderately Isolated (06/25/2023)   Social Connection and Isolation Panel [NHANES]    Frequency of Communication with Friends and Family: More than three times a week    Frequency of Social Gatherings with Friends and Family: More than three times a week    Attends Religious Services: More than 4 times per year    Active Member of [employer] or Organizations: No    Attends Banker Meetings: Never    Marital Status: Widowed  Intimate Partner Violence: Not At Risk (06/25/2023)   Humiliation, Afraid, Rape, and Kick questionnaire    Fear of Current or Ex-Partner: No    Emotionally Abused: No    Physically Abused: No    Sexually Abused: No   Family History  Problem Relation Age of Onset   Stroke Father 19   Diabetes Father    Asthma Father    Liver cancer Sister 46   Pancreatic cancer Sister 42   Brain cancer Brother    Diabetes Sister    Diabetes Sister    Diabetes Sister    Diabetes Sister    Diabetes Sister    Diabetes Sister    Diabetes Sister    Diabetes Paternal Grandmother    Breast cancer Maternal Aunt 44      VITAL SIGNS BP (!) 139/56   Pulse 80   Temp 98.4 F (36.9 C)   Resp 20   Ht 5\' 6"  (1.676 m)   Wt 118 lb 3.2 oz (53.6 kg)   SpO2 97%   BMI 19.08 kg/m   Outpatient Encounter Medications as of 07/21/2023  Medication Sig Note   albuterol (VENTOLIN HFA) 108 (90 Base) MCG/ACT inhaler Inhale 2 puffs into the lungs every 4 (four) hours as needed.    amLODipine (NORVASC) 10 MG tablet Take 1 tablet (10 mg total) by mouth daily.    calcium carbonate (OS-CAL - DOSED IN MG OF ELEMENTAL CALCIUM) 1250 (500 Ca) MG tablet Take 1 tablet (1,250 mg total) by mouth daily with breakfast.    insulin aspart (NOVOLOG) 100 UNIT/ML  injection Inject 5 Units into the skin 3 (three) times daily with meals. For cbg >150 07/20/2023: D/Ced as macular degeneration precludes self administration   insulin glargine-yfgn (SEMGLEE) 100 UNIT/ML injection Inject 0.2 mLs (20 Units total) into the skin daily. 07/20/2023: D/Ced as macular degeneration precludes self administration @ ALF   metoprolol tartrate (LOPRESSOR) 25 MG tablet Take 1 tablet (25 mg total) by mouth 2 (two) times daily.    Multiple Vitamins-Minerals (PRESERVISION/LUTEIN) CAPS Take 1 capsule by mouth as directed.    ondansetron (ZOFRAN-ODT) 4 MG disintegrating tablet Take 4 mg by mouth every  8 (eight) hours as needed.    predniSONE (DELTASONE) 10 MG tablet Take 10 mg by mouth daily with breakfast.    rosuvastatin (CRESTOR) 20 MG tablet Take 20 mg by mouth at bedtime.    sodium chloride 1 g tablet Take 2 tablets (2 g total) by mouth 2 (two) times daily with a meal.    [DISCONTINUED] omeprazole (PRILOSEC OTC) 20 MG tablet Take 1 tablet (20 mg total) by mouth daily as needed.    No facility-administered encounter medications on file as of 07/21/2023.     SIGNIFICANT DIAGNOSTIC EXAMS  TODAY  06-24-23: wbc 12.2; hgb 10.3; hct 28.7; mcv 82.8 plt 467; glucose 229; bun 12; creat 0.50 k+ 3.6; na++ 1115; ca 6.5 gfr >60 protein 6.1 albumin 2.7 BNP 567 06-25-23: tsh 1.326; hgb A1c 9.5 iron 33; tibc 264; vitamin B 12: 795; folate 11.9 07-02-23: glucose 237; bun 18; creat 0.66; k+ 4.1; na++ 121; ca 7.8 gfr >60 pre-albumin 23; cortisol 22.1   Review of Systems  Constitutional:  Negative for malaise/fatigue.  Respiratory:  Negative for cough and shortness of breath.   Cardiovascular:  Negative for chest pain, palpitations and leg swelling.  Gastrointestinal:  Negative for abdominal pain, constipation and heartburn.  Musculoskeletal:  Negative for back pain, joint pain and myalgias.  Skin: Negative.   Neurological:  Negative for dizziness.  Psychiatric/Behavioral:  The patient is not  nervous/anxious.      Physical Exam Constitutional:      General: She is not in acute distress.    Appearance: She is well-developed. She is not diaphoretic.  Neck:     Thyroid: No thyromegaly.  Cardiovascular:     Rate and Rhythm: Normal rate and regular rhythm.     Pulses: Normal pulses.     Heart sounds: Normal heart sounds.  Pulmonary:     Effort: Pulmonary effort is normal. No respiratory distress.     Breath sounds: Normal breath sounds.  Abdominal:     General: Bowel sounds are normal. There is no distension.     Palpations: Abdomen is soft.     Tenderness: There is no abdominal tenderness.  Musculoskeletal:        General: Normal range of motion.     Cervical back: Neck supple.     Right lower leg: No edema.     Left lower leg: No edema.  Lymphadenopathy:     Cervical: No cervical adenopathy.  Skin:    General: Skin is warm and dry.  Neurological:     Mental Status: She is alert. Mental status is at baseline.  Psychiatric:        Mood and Affect: Mood normal.      ASSESSMENT/ PLAN:  TODAY  Hypertension associated with type 2 diabetes mellitus  Aortic atherosclerosis Moderate protein calorie malnutrition  Will continue current medications Will continue therapy as directed Will continue to monitor her status.  Goal of care is assisted living: will stop daily weights and will stop fluid restriction as she is unable to adhere  Time spent with patient: 40 minutes: therapy; medications; goals of care.    Synthia Innocent NP [facility]  call [phone]

## 2023-07-24 ENCOUNTER — Encounter: Payer: Self-pay | Admitting: Adult Health

## 2023-07-24 NOTE — Progress Notes (Unsigned)
 This encounter was created in error - please disregard.

## 2023-08-02 ENCOUNTER — Other Ambulatory Visit: Payer: Self-pay | Admitting: Internal Medicine

## 2023-08-08 ENCOUNTER — Other Ambulatory Visit: Payer: Self-pay | Admitting: Internal Medicine

## 2024-04-04 ENCOUNTER — Emergency Department (HOSPITAL_COMMUNITY)
Admission: EM | Admit: 2024-04-04 | Discharge: 2024-04-04 | Disposition: A | Discharge status: Home / Self Care | Attending: Emergency Medicine | Admitting: Emergency Medicine

## 2024-04-04 ENCOUNTER — Other Ambulatory Visit: Payer: Self-pay

## 2024-04-04 ENCOUNTER — Encounter (HOSPITAL_COMMUNITY): Payer: Self-pay

## 2024-04-04 DIAGNOSIS — F419 Anxiety disorder, unspecified: Secondary | ICD-10-CM | POA: Insufficient documentation

## 2024-04-04 DIAGNOSIS — I10 Essential (primary) hypertension: Secondary | ICD-10-CM | POA: Diagnosis not present

## 2024-04-04 DIAGNOSIS — Z87891 Personal history of nicotine dependence: Secondary | ICD-10-CM | POA: Insufficient documentation

## 2024-04-04 DIAGNOSIS — E871 Hypo-osmolality and hyponatremia: Secondary | ICD-10-CM | POA: Diagnosis not present

## 2024-04-04 DIAGNOSIS — J449 Chronic obstructive pulmonary disease, unspecified: Secondary | ICD-10-CM | POA: Insufficient documentation

## 2024-04-04 DIAGNOSIS — E119 Type 2 diabetes mellitus without complications: Secondary | ICD-10-CM | POA: Insufficient documentation

## 2024-04-04 LAB — CBC
HCT: 39.7 % (ref 36.0–46.0)
Hemoglobin: 13.3 g/dL (ref 12.0–15.0)
MCH: 29.6 pg (ref 26.0–34.0)
MCHC: 33.5 g/dL (ref 30.0–36.0)
MCV: 88.2 fL (ref 80.0–100.0)
Platelets: 358 K/uL (ref 150–400)
RBC: 4.5 MIL/uL (ref 3.87–5.11)
RDW: 11.9 % (ref 11.5–15.5)
WBC: 7.4 K/uL (ref 4.0–10.5)
nRBC: 0 % (ref 0.0–0.2)

## 2024-04-04 LAB — BASIC METABOLIC PANEL WITH GFR
Anion gap: 11 (ref 5–15)
BUN: 13 mg/dL (ref 8–23)
CO2: 28 mmol/L (ref 22–32)
Calcium: 9.3 mg/dL (ref 8.9–10.3)
Chloride: 88 mmol/L — ABNORMAL LOW (ref 98–111)
Creatinine, Ser: 0.75 mg/dL (ref 0.44–1.00)
GFR, Estimated: >60 mL/min (ref >60)
Glucose, Bld: 183 mg/dL — ABNORMAL HIGH (ref 70–99)
Potassium: 4.6 mmol/L (ref 3.5–5.1)
Sodium: 127 mmol/L — ABNORMAL LOW (ref 135–145)

## 2024-04-04 LAB — CBG MONITORING, ED: Glucose-Capillary: 168 mg/dL — ABNORMAL HIGH (ref 70–99)

## 2024-04-04 MED ORDER — METOPROLOL TARTRATE 25 MG PO TABS
25.0000 mg | ORAL_TABLET | Freq: Two times a day (BID) | ORAL | Status: DC
  Administered 2024-04-04: 25 mg via ORAL
  Filled 2024-04-04: qty 1

## 2024-04-04 MED ORDER — SODIUM CHLORIDE 0.9 % IV BOLUS
1000.0000 mL | Freq: Once | INTRAVENOUS | Status: AC
  Administered 2024-04-04: 1000 mL via INTRAVENOUS

## 2024-04-04 MED ORDER — METFORMIN HCL ER 500 MG PO TB24: 1000.0000 mg | ORAL_TABLET | Freq: Every day | ORAL | Status: DC

## 2024-04-04 MED ORDER — METOPROLOL TARTRATE 25 MG PO TABS: 25.0000 mg | ORAL_TABLET | Freq: Two times a day (BID) | ORAL | Status: DC

## 2024-04-04 MED ORDER — AMLODIPINE BESYLATE 5 MG PO TABS
10.0000 mg | ORAL_TABLET | Freq: Every day | ORAL | Status: DC
  Administered 2024-04-04: 10 mg via ORAL
  Filled 2024-04-04: qty 2

## 2024-04-04 MED ORDER — ALPRAZOLAM 0.5 MG PO TABS
0.2500 mg | ORAL_TABLET | Freq: Once | ORAL | Status: AC
  Administered 2024-04-04: 0.25 mg via ORAL
  Filled 2024-04-04: qty 1

## 2024-04-04 MED ORDER — AMLODIPINE BESYLATE 5 MG PO TABS: 10.0000 mg | ORAL_TABLET | Freq: Every day | ORAL | Status: DC

## 2024-04-04 MED ORDER — LORAZEPAM 2 MG/ML IJ SOLN
1.0000 mg | Freq: Once | INTRAMUSCULAR | Status: AC
  Administered 2024-04-04: 1 mg via INTRAVENOUS
  Filled 2024-04-04: qty 1

## 2024-04-04 NOTE — ED Triage Notes (Addendum)
 Went to bed at 10pm Got up at 12pm CBG 95 ate a donut Couldn't sleep Felt anxious ; issues of anxiety  Took half a xanax  around 1am  Same thing happened on Sunday but it went away

## 2024-04-04 NOTE — Discharge Instructions (Signed)
 You were evaluated in the Emergency Department and after careful evaluation, we did not find any emergent condition requiring admission or further testing in the hospital.  Your exam/testing today is overall reassuring.  Recommend close follow-up with your primary care doctor to discuss your low sodium levels, you may need a repeat sodium check within the next week.  Continue your home medications.  Please return to the Emergency Department if you experience any worsening of your condition.   Thank you for allowing us  to be a part of your care.

## 2024-04-04 NOTE — ED Provider Notes (Signed)
 AP-EMERGENCY DEPT [facility] Emergency Department Provider Note MRN:  [id number]  Arrival date & time: 04/04/24     Chief Complaint   Anxiety   History of Present Illness   Sara Stafford is a 78 y.o. year-old female with a history of diabetes, COPD presenting to the ED with chief complaint of anxiety.  Patient explains that she was up all night with anxiety and racing thoughts in bed, could not get comfortable, only slept 1 hour.  Feels like something is wrong, feels like her sodium is low.  Denies any pain, no shortness of breath, no numbness or weakness to the arms or legs.  Review of Systems  A thorough review of systems was obtained and all systems are negative except as noted in the HPI and PMH.   Patient's Health History    Past Medical History:  Diagnosis Date   Arthritis    COPD (chronic obstructive pulmonary disease) (HCC)    no change with spiriva, poor tolerance of advair   Depressive disorder, not elsewhere classified    Diabetes mellitus    Fatigue    Fibromyalgia    GERD (gastroesophageal reflux disease)    Hyperlipidemia    Hypertension    Macular degeneration    Routine general medical examination at a health care facility     Past Surgical History:  Procedure Laterality Date   ABDOMINAL HYSTERECTOMY     partial   CATARACT EXTRACTION Left 02/09/2015   CATARACT EXTRACTION Right 01/26/2015   CHOLECYSTECTOMY N/A 02/16/2022   Procedure: LAPAROSCOPIC CHOLECYSTECTOMY WITH INTRAOPERATIVE CHOLANGIOGRAM;  Surgeon: Dessa Reyes ORN, MD;  Location: [facility] ORS;  Service: General;  Laterality: N/AMERL Shelba Rakers, RNFA to assist   COLONOSCOPY WITH PROPOFOL  N/A 04/28/2021   Procedure: COLONOSCOPY WITH PROPOFOL ;  Surgeon: Dessa Reyes ORN, MD;  Location: [facility] ENDOSCOPY;  Service: Endoscopy;  Laterality: N/A;   ESOPHAGOGASTRODUODENOSCOPY (EGD) WITH PROPOFOL  N/A 04/28/2021   Procedure: ESOPHAGOGASTRODUODENOSCOPY (EGD) WITH PROPOFOL ;  Surgeon: Dessa Reyes ORN, MD;  Location: [facility] ENDOSCOPY;  Service: Endoscopy;  Laterality: N/A;    Family History  Problem Relation Age of Onset   Stroke Father 12   Diabetes Father    Asthma Father    Liver cancer Sister 1   Pancreatic cancer Sister 80   Brain cancer Brother    Diabetes Sister    Diabetes Sister    Diabetes Sister    Diabetes Sister    Diabetes Sister    Diabetes Sister    Diabetes Sister    Diabetes Paternal Grandmother    Breast cancer Maternal Aunt 100    Social History   Socioeconomic History   Marital status: Married    Spouse name: Not on file   Number of children: 2   Years of education: Not on file   Highest education level: Not on file  Occupational History   Occupation: retired    Associate Professor: RETIRED    Comment: [employer]  Tobacco Use   Smoking status: Former    Current packs/day: 0.00    Average packs/day: 1 pack/day for 50.0 years (50.0 ttl pk-yrs)    Types: Cigarettes    Start date: 06/06/1960    Quit date: 06/06/2010    Years since quitting: 13.8   Smokeless tobacco: Never  Vaping Use   Vaping status: Never Used  Substance and Sexual Activity   Alcohol use: No    Alcohol/week: 0.0 standard drinks of alcohol   Drug use: No   Sexual activity:  Not Currently  Other Topics Concern   Not on file  Social History Narrative   Children aged 83, and 69, expecting 2nd grandchild   Social Drivers of Corporate Investment Banker Strain: Low Risk  (08/18/2023)   Received from [facility]   Overall Financial Resource Strain (CARDIA)    Difficulty of Paying Living Expenses: Not hard at all  Food Insecurity: No Food Insecurity (08/18/2023)   Received from [facility]   Hunger Vital Sign    Within the past 12 months, you worried that your food would run out before you got the money to buy more.: Never true    Within the past 12 months, the food you bought just didn't last and you didn't have money to get more.: Never true   Transportation Needs: No Transportation Needs (08/18/2023)   Received from [facility] - Transportation    In the past 12 months, has lack of transportation kept you from medical appointments or from getting medications?: No    Lack of Transportation (Non-Medical): No  Physical Activity: Not on file  Stress: Not on file  Social Connections: Moderately Isolated (06/25/2023)   Social Connection and Isolation Panel    Frequency of Communication with Friends and Family: More than three times a week    Frequency of Social Gatherings with Friends and Family: More than three times a week    Attends Religious Services: More than 4 times per year    Active Member of [employer] or Organizations: No    Attends Banker Meetings: Never    Marital Status: Widowed  Intimate Partner Violence: Not At Risk (06/25/2023)   Humiliation, Afraid, Rape, and Kick questionnaire    Fear of Current or Ex-Partner: No    Emotionally Abused: No    Physically Abused: No    Sexually Abused: No     Physical Exam   Vitals:   04/04/24 0615 04/04/24 0645  BP: (!) 169/79 (!) 169/84  Pulse: 80 83  Resp: (!) 24 (!) 24  Temp:    SpO2: 96% 97%    CONSTITUTIONAL: Well-appearing, mildly anxious NEURO/PSYCH:  Alert and oriented x 3, no focal deficits EYES:  eyes equal and reactive ENT/NECK:  no LAD, no JVD CARDIO: Regular rate, well-perfused, normal S1 and S2 PULM:  CTAB no wheezing or rhonchi GI/GU:  non-distended, non-tender MSK/SPINE:  No gross deformities, no edema SKIN:  no rash, atraumatic   *Additional and/or pertinent findings included in MDM below  Diagnostic and Interventional Summary    EKG Interpretation Date/Time:  Thursday April 04 2024 05:39:20 EST Ventricular Rate:  86 PR Interval:  213 QRS Duration:  92 QT Interval:  367 QTC Calculation: 439 R Axis:   31  Text Interpretation: Sinus rhythm Borderline prolonged PR interval No significant change was  found Confirmed by Stafford Sharper [phone]) on 04/04/2024 6:15:19 AM       Labs Reviewed  BASIC METABOLIC PANEL WITH GFR - Abnormal; Notable for the following components:      Result Value   Sodium 127 (*)    Chloride 88 (*)    Glucose, Bld 183 (*)    All other components within normal limits  CBC    No orders to display    Medications  ALPRAZolam  (XANAX ) tablet 0.25 mg (0.25 mg Oral Given 04/04/24 0538)  sodium chloride  0.9 % bolus 1,000 mL (1,000 mLs Intravenous New Bag/Given 04/04/24 0627)  Procedures  /  Critical Care Procedures  ED Course and Medical Decision Making  Initial Impression and Ddx Patient has a history of pretty severe hyponatremia requiring hospitalization earlier in the year.  Could be recurrence.  Could be anxiety.  Arrhythmias also considered.  Otherwise minimal complaints, mildly hypertensive in the setting of anxiety.  Providing small dose of Xanax , checking labs, will reassess.  Past medical/surgical history that increases complexity of ED encounter: Hyponatremia, COPD  Interpretation of Diagnostics I personally reviewed the EKG and my interpretation is as follows: Sinus rhythm without significant change from prior  Labs reveal hyponatremia, otherwise no significant blood count or electrolyte disturbance  Patient Reassessment and Ultimate Disposition/Management     Patient provided with bolus IV fluids, feeling much better after Xanax , appropriate for discharge with close follow-up.  Patient management required discussion with the following services or consulting groups:  None  Complexity of Problems Addressed Acute illness or injury that poses threat of life of bodily function  Additional Data Reviewed and Analyzed Further history obtained from: Further history from spouse/family member, Past medical history and medications listed in the EMR, and Recent discharge summary  Additional Factors Impacting ED Encounter Risk Consideration of  hospitalization  Sara HERO. [place], MD [facility] Emergency Medicine [facility] [email or web address]  Final Clinical Impressions(s) / ED Diagnoses     ICD-10-CM   1. Hyponatremia  E87.1     2. Anxiety  F41.9       ED Discharge Orders     None        Discharge Instructions Discussed with and Provided to Patient:    Discharge Instructions      You were evaluated in the Emergency Department and after careful evaluation, we did not find any emergent condition requiring admission or further testing in the hospital.  Your exam/testing today is overall reassuring.  Recommend close follow-up with your primary care doctor to discuss your low sodium levels, you may need a repeat sodium check within the next week.  Continue your home medications.  Please return to the Emergency Department if you experience any worsening of your condition.   Thank you for allowing us  to be a part of your care.      Stafford Sara HERO, MD 04/04/24 [phone]

## 2024-04-08 NOTE — Progress Notes (Signed)
 "                                    Patient Profile:   Sara Stafford  is a 78 y.o.  female Chief Complaint  Patient presents with   Ed Follow-up      PROBLEM LIST: Past Medical History:  Diagnosis Date   CKD (chronic kidney disease)    COPD (chronic obstructive pulmonary disease) (CMS/HHS-HCC)    Depressive disorder    Diabetes (CMS/HHS-HCC)    DM type 2 with diabetic mixed hyperlipidemia (CMS/HHS-HCC) 06/09/2021   Fatigue    Fibromyalgia    GERD (gastroesophageal reflux disease)    Hyperlipidemia    Hypertension    Macular degeneration    Osteoarthritis    Sciatica     Past Surgical History:  Procedure Laterality Date   CATARACT EXTRACTION Right 01/26/2015   CATARACT EXTRACTION Left 02/09/2015   EGD  04/28/2021   Gastritis/Multiple gastric polyps/Repeat as needed, based on clinical needs only/[facility]   COLONOSCOPY  04/28/2021   Hyperplastic polyp/Repeat as needed, based on clinical needs only/[facility]   LAPAROSCOPIC CHOLECYSTECTOMY W/ CHOLANGIOGRAPHY  02/16/2022   LEG SKIN LESION  BIOPSY / EXCISION Right 03/15/2022   ABDOMINAL HYSTERECTOMY     CHOLECYSTECTOMY      ALLERGIES: Allergies  Allergen Reactions   Effexor  [Venlafaxine ] Other (See Comments)    Low sodium   Jardiance [Empagliflozin] Nausea   Ozempic [Semaglutide] Nausea   Remeron [Mirtazapine] Other (See Comments)    Significant fatigue at low-dose   Doxycycline Other (See Comments)    thrush    CURRENT MEDICATIONS: Current Outpatient Medications  Medication Sig Dispense Refill   acetaminophen  (TYLENOL ) 325 MG tablet TAKE (2) TABLETS (650MG ) BY MOUTH EVERY SIX HOURS AS NEEDED FOR PAIN. 240 tablet 0   albuterol  MDI, PROVENTIL , VENTOLIN , PROAIR , HFA 90 mcg/actuation inhaler INHALE (2) PUFFS INTO THE LUNGS EVERY FOUR HOURS AS NEEDED FOR SHORTNESS OF BREATH. MAY KEEP AT BEDSIDE. 8.5 g 0   ALPRAZolam  (XANAX ) 0.25 MG tablet TAKE ONE TABLET BY MOUTH TWICE DAILY 60 tablet 5    ALPRAZolam  (XANAX ) 0.25 MG tablet Take 1 tablet (0.25 mg total) by mouth at bedtime as needed for Sleep for up to 90 days 30 tablet 2   ANTACID, CALCIUM  CARBONATE, 200 mg calcium  (500 mg) chewable tablet TAKE (1) TABLET BY MOUTH DAILY. 30 tablet 0   calcium  carbonate 500 mg calcium  (1,250 mg) tablet TAKE ONE TABLET BY MOUTH ONCE DAILY WITH WITH BREAKFAST 30 tablet 5   fluticasone  propion-salmeteroL (ADVAIR DISKUS) 100-50 mcg/dose diskus inhaler Inhale 1 Puff into the lungs every 12 (twelve) hours 1 each 12   glipiZIDE  (GLUCOTROL ) 10 MG tablet Take 1 tablet (10 mg total) by mouth 2 (two) times daily 60 tablet 11   metFORMIN  (GLUCOPHAGE -XR) 500 MG XR tablet Take 2 tablets (1,000 mg total) by mouth once daily 60 tablet 11   metoprolol  TARTrate (LOPRESSOR ) 25 MG tablet Take 1 tablet (25 mg total) by mouth 2 (two) times daily 60 tablet 0   multivitamin with minerals, EYE, (PRESERVISION AREDS 2) soft gel capsule TAKE (1) CAPSULE BY MOUTH DAILY. 30 capsule 0   nystatin (MYCOSTATIN) 100,000 unit/gram cream Apply topically 2 (two) times daily 30 g 1   omeprazole  (PRILOSEC ) 20 MG DR capsule Take 1 capsule (20 mg total) by mouth once daily 90 capsule 3   ONETOUCH ULTRA2 METER kit Use as instructed.  1 each 0   pen needle, diabetic, safety (DROPSAFE PEN NEEDLE) 31 gauge x 1/4 Ndle USE AS DIRECTED TO INJECT INSULIN . 100 each 0   PRODIGY NO CODING test strip check blood sugar EVERY DAY 100 strip 0   PRODIGY TWIST TOP LANCET Use 1 each once daily     SITagliptin  phosphate (JANUVIA ) 100 MG tablet Take 1 tablet (100 mg total) by mouth once daily 30 tablet 11   sodium chloride  1,000 mg soluble tablet Take 2 tablets (2 g total) by mouth 2 (two) times daily 120 tablet 11   traZODone (DESYREL) 50 MG tablet Take 1 tablet (50 mg total) by mouth at bedtime 30 tablet 11   vit C/E/cuperic/zinc/lutein (PRESERVISION LUTEIN ORAL) Take by mouth 2 (two) times daily     No current facility-administered  medications for this visit.      HPI   CLINICAL SUMMARY:  Patient presents 7 days post ER visit.  She has been having racing thoughts, sodium 127, has been on Lozol, on 4 salt pills daily.  She was given 1 L of normal saline, she has been drinking electrolyte drink, still a bit agitated.  ROS: Review of systems is unremarkable for any active cardiac, respiratory, GI, GU, hematologic, neurologic, dermatologic, HEENT, or psychiatric symptoms except as noted above, 10 systems reviewed.  No fevers, chills, or constitutional symptoms.   PHYSICAL EXAM  Vital signs:  BP 138/82   Pulse 105   Wt 55.6 kg (122 lb 9.6 oz)   SpO2 93%   BMI 21.04 kg/m  Body mass index is 21.04 kg/m.   Wt Readings from Last 3 Encounters:  04/08/24 55.6 kg (122 lb 9.6 oz)  02/06/24 54.9 kg (121 lb)  01/22/24 56.1 kg (123 lb 9.6 oz)     BP Readings from Last 3 Encounters:  04/08/24 138/82  02/06/24 116/70  01/22/24 138/70    Constitutional:NAD Neck: supple, no thyromegaly, good ROM Respiratory:clear to auscultation, no rales or wheezes Cardiovascular:RRR, no murmur or gallop Abdominal:soft, good BS, NT Ext: no edema, good peripheral pulses Neuro: alert and oriented X 3, grossly nonfocal     ASSESSMENT/PLAN   Racing thoughts/agitation-talked with her about increasing Xanax  but after further discussion I do not think that is a good idea.  The question is whether this is related to her hyponatremia, uncertain Hyponatremia-will be very aggressive by limiting fluid intake to 4 glasses, continue for salt pills daily, discontinue Lozol, recheck sodium pending Type 2 diabetes-overall controlled  Dispo:   No follow-ups on file.     "

## 2024-05-16 NOTE — Progress Notes (Signed)
 "                                    Patient Profile:   Sara Stafford  is a 78 y.o.  female Chief Complaint  Patient presents with   Follow-up    Reports right shoulder pain. Would like a cortisone shot.      PROBLEM LIST: Past Medical History:  Diagnosis Date   CKD (chronic kidney disease)    COPD (chronic obstructive pulmonary disease) (CMS/HHS-HCC)    Depressive disorder    Diabetes (CMS/HHS-HCC)    DM type 2 with diabetic mixed hyperlipidemia (CMS/HHS-HCC) 06/09/2021   Fatigue    Fibromyalgia    GERD (gastroesophageal reflux disease)    Hyperlipidemia    Hypertension    Macular degeneration    Osteoarthritis    Sciatica     Past Surgical History:  Procedure Laterality Date   CATARACT EXTRACTION Right 01/26/2015   CATARACT EXTRACTION Left 02/09/2015   EGD  04/28/2021   Gastritis/Multiple gastric polyps/Repeat as needed, based on clinical needs only/[facility]   COLONOSCOPY  04/28/2021   Hyperplastic polyp/Repeat as needed, based on clinical needs only/[facility]   LAPAROSCOPIC CHOLECYSTECTOMY W/ CHOLANGIOGRAPHY  02/16/2022   LEG SKIN LESION  BIOPSY / EXCISION Right 03/15/2022   ABDOMINAL HYSTERECTOMY     CHOLECYSTECTOMY      ALLERGIES: Allergies  Allergen Reactions   Effexor  [Venlafaxine ] Other (See Comments)    Low sodium   Jardiance [Empagliflozin] Nausea   Ozempic [Semaglutide] Nausea   Remeron [Mirtazapine] Other (See Comments)    Significant fatigue at low-dose   Doxycycline Other (See Comments)    thrush    CURRENT MEDICATIONS: Current Outpatient Medications  Medication Sig Dispense Refill   acetaminophen  (TYLENOL ) 325 MG tablet TAKE (2) TABLETS (650MG ) BY MOUTH EVERY SIX HOURS AS NEEDED FOR PAIN. (Patient taking differently: BID) 240 tablet 0   albuterol  MDI, PROVENTIL , VENTOLIN , PROAIR , HFA 90 mcg/actuation inhaler INHALE (2) PUFFS INTO THE LUNGS EVERY FOUR HOURS AS NEEDED FOR SHORTNESS OF BREATH. MAY KEEP AT BEDSIDE. 8.5 g  0   ALPRAZolam  (XANAX ) 0.25 MG tablet TAKE ONE TABLET BY MOUTH TWICE DAILY 60 tablet 5   ALPRAZolam  (XANAX ) 0.25 MG tablet Take 1 tablet (0.25 mg total) by mouth at bedtime as needed for Sleep for up to 90 days 30 tablet 2   ANTACID, CALCIUM  CARBONATE, 200 mg calcium  (500 mg) chewable tablet TAKE (1) TABLET BY MOUTH DAILY. 30 tablet 0   calcium  carbonate 500 mg calcium  (1,250 mg) tablet TAKE ONE TABLET BY MOUTH ONCE DAILY WITH WITH BREAKFAST 30 tablet 5   fluticasone  propion-salmeteroL (ADVAIR DISKUS) 100-50 mcg/dose diskus inhaler Inhale 1 Puff into the lungs every 12 (twelve) hours 1 each 12   glipiZIDE  (GLUCOTROL ) 10 MG tablet Take 1 tablet (10 mg total) by mouth 2 (two) times daily 60 tablet 11   indapamide (LOZOL) 1.25 MG tablet Take 1 tablet (1.25 mg total) by mouth once daily 90 tablet 3   metFORMIN  (GLUCOPHAGE -XR) 500 MG XR tablet Take 2 tablets (1,000 mg total) by mouth once daily 60 tablet 11   metoprolol  TARTrate (LOPRESSOR ) 25 MG tablet Take 1 tablet (25 mg total) by mouth 2 (two) times daily 60 tablet 0   multivitamin with minerals, EYE, (PRESERVISION AREDS 2) soft gel capsule TAKE (1) CAPSULE BY MOUTH DAILY. 30 capsule 0   nystatin (MYCOSTATIN) 100,000 unit/gram cream Apply topically 2 (two)  times daily 30 g 1   omeprazole  (PRILOSEC ) 20 MG DR capsule Take 1 capsule (20 mg total) by mouth once daily 90 capsule 3   ONETOUCH ULTRA2 METER kit Use as instructed. 1 each 0   pen needle, diabetic, safety (DROPSAFE PEN NEEDLE) 31 gauge x 1/4 Ndle USE AS DIRECTED TO INJECT INSULIN . 100 each 0   PRODIGY NO CODING test strip check blood sugar EVERY DAY 100 strip 0   PRODIGY TWIST TOP LANCET Use 1 each once daily     SITagliptin  phosphate (JANUVIA ) 100 MG tablet Take 1 tablet (100 mg total) by mouth once daily 30 tablet 11   sodium chloride  1,000 mg soluble tablet Take 2 tablets (2 g total) by mouth 2 (two) times daily 120 tablet 11   traZODone (DESYREL) 50 MG tablet Take 1  tablet (50 mg total) by mouth at bedtime 30 tablet 11   vit C/E/cuperic/zinc/lutein (PRESERVISION LUTEIN ORAL) Take by mouth 2 (two) times daily     No current facility-administered medications for this visit.      HPI   CLINICAL SUMMARY:  Patient able to live by herself, vision is very limiting.  A1c at 8.0.  Sodium 133.  Cholesterol up significantly to 244 from 139.  Right shoulder bothering her profoundly, cannot AB duct the shoulder well.  ROS: Review of systems is unremarkable for any active cardiac, respiratory, GI, GU, hematologic, neurologic, dermatologic, HEENT, or psychiatric symptoms except as noted above, 10 systems reviewed.  No fevers, chills, or constitutional symptoms.   PHYSICAL EXAM  Vital signs:  BP 136/80   Pulse 92   Wt 56.1 kg (123 lb 9.6 oz)   SpO2 94%   BMI 21.22 kg/m  Body mass index is 21.22 kg/m.   Wt Readings from Last 3 Encounters:  05/16/24 56.1 kg (123 lb 9.6 oz)  04/08/24 55.6 kg (122 lb 9.6 oz)  02/06/24 54.9 kg (121 lb)     BP Readings from Last 3 Encounters:  05/16/24 136/80  04/08/24 138/82  02/06/24 116/70    Constitutional:NAD Neck: supple, no thyromegaly, good ROM Respiratory:clear to auscultation, no rales or wheezes Cardiovascular:RRR, no murmur or gallop Abdominal:soft, good BS, NT Ext: no edema, good peripheral pulses, full range of motion in right shoulder, poor abduction Neuro: alert and oriented X 3, grossly nonfocal     ASSESSMENT/PLAN   Severe right shoulder tenosynovitis/arthritis-subdeltoid injection given, OT/PT Type 2 diabetes-almost type I, overall diet is reasonable, on maximum medication, A1c 8.0, discussion about reasonableness and she cannot do insulin  because she cannot see well enough to pull up the insulin , continue current plan Hyperlipidemia-intolerant to Crestor , lipids up, pravastatin 20 mg daily Anxiety-moderate issue, continues on routine Xanax   Dispo:   Return in about 4 months (around  09/14/2024) for followup.     "

## 2024-05-16 NOTE — Procedures (Signed)
 After time out occurred and verbal informed consent obtained, 1.5cc Marcaine  and 2cc Kenalog  were injected under sterile conditions into the right  subdeltoid bursa  without complication.

## 2024-06-14 ENCOUNTER — Other Ambulatory Visit (HOSPITAL_COMMUNITY): Payer: Self-pay | Admitting: Podiatry

## 2024-06-14 DIAGNOSIS — I739 Peripheral vascular disease, unspecified: Secondary | ICD-10-CM

## 2024-06-17 ENCOUNTER — Ambulatory Visit (HOSPITAL_COMMUNITY)
Admission: RE | Admit: 2024-06-17 | Discharge: 2024-06-17 | Disposition: A | Discharge status: Home / Self Care | Source: Ambulatory Visit | Attending: Podiatry | Admitting: Podiatry

## 2024-06-17 DIAGNOSIS — I739 Peripheral vascular disease, unspecified: Secondary | ICD-10-CM | POA: Insufficient documentation
